# Patient Record
Sex: Female | Born: 1965 | Race: White | Hispanic: No | Smoking: Former smoker
Health system: Southern US, Community
[De-identification: ages and names within clinical notes are randomized; demographics above are authoritative.]

## PROBLEM LIST (undated history)

## (undated) DIAGNOSIS — K219 Gastro-esophageal reflux disease without esophagitis: Secondary | ICD-10-CM

## (undated) DIAGNOSIS — R05 Cough: Secondary | ICD-10-CM

## (undated) DIAGNOSIS — R42 Dizziness and giddiness: Secondary | ICD-10-CM

## (undated) DIAGNOSIS — M545 Low back pain, unspecified: Secondary | ICD-10-CM

## (undated) DIAGNOSIS — E049 Nontoxic goiter, unspecified: Secondary | ICD-10-CM

## (undated) DIAGNOSIS — T7840XA Allergy, unspecified, initial encounter: Secondary | ICD-10-CM

## (undated) DIAGNOSIS — N393 Stress incontinence (female) (male): Secondary | ICD-10-CM

## (undated) DIAGNOSIS — C439 Malignant melanoma of skin, unspecified: Secondary | ICD-10-CM

## (undated) DIAGNOSIS — Z85118 Personal history of other malignant neoplasm of bronchus and lung: Secondary | ICD-10-CM

## (undated) DIAGNOSIS — K644 Residual hemorrhoidal skin tags: Secondary | ICD-10-CM

## (undated) DIAGNOSIS — J449 Chronic obstructive pulmonary disease, unspecified: Secondary | ICD-10-CM

## (undated) DIAGNOSIS — G43909 Migraine, unspecified, not intractable, without status migrainosus: Secondary | ICD-10-CM

## (undated) DIAGNOSIS — R569 Unspecified convulsions: Secondary | ICD-10-CM

## (undated) DIAGNOSIS — I351 Nonrheumatic aortic (valve) insufficiency: Secondary | ICD-10-CM

## (undated) DIAGNOSIS — J45909 Unspecified asthma, uncomplicated: Secondary | ICD-10-CM

## (undated) DIAGNOSIS — J439 Emphysema, unspecified: Secondary | ICD-10-CM

## (undated) DIAGNOSIS — R0600 Dyspnea, unspecified: Secondary | ICD-10-CM

## (undated) DIAGNOSIS — Z1379 Encounter for other screening for genetic and chromosomal anomalies: Secondary | ICD-10-CM

## (undated) DIAGNOSIS — R059 Cough, unspecified: Secondary | ICD-10-CM

## (undated) DIAGNOSIS — R0683 Snoring: Secondary | ICD-10-CM

## (undated) DIAGNOSIS — E7800 Pure hypercholesterolemia, unspecified: Secondary | ICD-10-CM

## (undated) DIAGNOSIS — M7500 Adhesive capsulitis of unspecified shoulder: Secondary | ICD-10-CM

## (undated) DIAGNOSIS — E876 Hypokalemia: Secondary | ICD-10-CM

## (undated) DIAGNOSIS — F419 Anxiety disorder, unspecified: Secondary | ICD-10-CM

## (undated) DIAGNOSIS — M7989 Other specified soft tissue disorders: Secondary | ICD-10-CM

## (undated) DIAGNOSIS — G4700 Insomnia, unspecified: Secondary | ICD-10-CM

## (undated) DIAGNOSIS — R1013 Epigastric pain: Secondary | ICD-10-CM

## (undated) DIAGNOSIS — C3490 Malignant neoplasm of unspecified part of unspecified bronchus or lung: Secondary | ICD-10-CM

## (undated) DIAGNOSIS — C189 Malignant neoplasm of colon, unspecified: Secondary | ICD-10-CM

## (undated) DIAGNOSIS — J9601 Acute respiratory failure with hypoxia: Secondary | ICD-10-CM

## (undated) DIAGNOSIS — I509 Heart failure, unspecified: Secondary | ICD-10-CM

## (undated) DIAGNOSIS — F410 Panic disorder [episodic paroxysmal anxiety] without agoraphobia: Secondary | ICD-10-CM

## (undated) DIAGNOSIS — J84111 Idiopathic interstitial pneumonia, not otherwise specified: Secondary | ICD-10-CM

## (undated) HISTORY — DX: Nontoxic goiter, unspecified: E04.9

## (undated) HISTORY — DX: Heart failure, unspecified: I50.9

## (undated) HISTORY — DX: Anxiety disorder, unspecified: F41.9

## (undated) HISTORY — DX: Malignant neoplasm of unspecified part of unspecified bronchus or lung: C34.90

## (undated) HISTORY — DX: Emphysema, unspecified: J43.9

## (undated) HISTORY — DX: Malignant melanoma of skin, unspecified: C43.9

## (undated) HISTORY — PX: COLONOSCOPY: SHX174

## (undated) HISTORY — DX: Allergy, unspecified, initial encounter: T78.40XA

## (undated) HISTORY — DX: Acute respiratory failure with hypoxia: J96.01

## (undated) HISTORY — DX: Stress incontinence (female) (male): N39.3

## (undated) HISTORY — DX: Nonrheumatic aortic (valve) insufficiency: I35.1

## (undated) HISTORY — DX: Encounter for other screening for genetic and chromosomal anomalies: Z13.79

## (undated) HISTORY — DX: Low back pain, unspecified: M54.50

## (undated) HISTORY — DX: Idiopathic interstitial pneumonia, not otherwise specified: J84.111

## (undated) HISTORY — DX: Malignant neoplasm of colon, unspecified: C18.9

## (undated) HISTORY — DX: Unspecified asthma, uncomplicated: J45.909

## (undated) HISTORY — DX: Hypokalemia: E87.6

## (undated) HISTORY — DX: Migraine, unspecified, not intractable, without status migrainosus: G43.909

## (undated) HISTORY — DX: Unspecified convulsions: R56.9

## (undated) HISTORY — DX: Epigastric pain: R10.13

## (undated) HISTORY — DX: Pure hypercholesterolemia, unspecified: E78.00

## (undated) HISTORY — DX: Adhesive capsulitis of unspecified shoulder: M75.00

## (undated) HISTORY — DX: Residual hemorrhoidal skin tags: K64.4

## (undated) HISTORY — DX: Dizziness and giddiness: R42

## (undated) HISTORY — DX: Personal history of other malignant neoplasm of bronchus and lung: Z85.118

## (undated) HISTORY — DX: Gastro-esophageal reflux disease without esophagitis: K21.9

## (undated) HISTORY — DX: Dyspnea, unspecified: R06.00

## (undated) HISTORY — PX: POLYPECTOMY: SHX149

## (undated) HISTORY — DX: Snoring: R06.83

## (undated) HISTORY — DX: Panic disorder (episodic paroxysmal anxiety): F41.0

## (undated) HISTORY — DX: Insomnia, unspecified: G47.00

## (undated) HISTORY — DX: Low back pain: M54.5

## (undated) HISTORY — PX: LUNG REMOVAL, PARTIAL: SHX233

## (undated) HISTORY — DX: Other specified soft tissue disorders: M79.89

---

## 1985-09-02 HISTORY — PX: BREAST EXCISIONAL BIOPSY: SUR124

## 1999-09-03 HISTORY — PX: ABDOMINAL HYSTERECTOMY: SHX81

## 2000-04-04 ENCOUNTER — Other Ambulatory Visit: Admission: RE | Admit: 2000-04-04 | Discharge: 2000-04-04 | Payer: Self-pay | Admitting: Orthopedic Surgery

## 2000-09-29 ENCOUNTER — Other Ambulatory Visit: Admission: RE | Admit: 2000-09-29 | Discharge: 2000-09-29 | Payer: Self-pay | Admitting: Obstetrics and Gynecology

## 2000-10-06 ENCOUNTER — Ambulatory Visit (HOSPITAL_COMMUNITY): Admission: RE | Admit: 2000-10-06 | Discharge: 2000-10-06 | Payer: Self-pay | Admitting: Obstetrics and Gynecology

## 2000-10-06 ENCOUNTER — Encounter: Payer: Self-pay | Admitting: Obstetrics and Gynecology

## 2000-12-01 HISTORY — PX: OTHER SURGICAL HISTORY: SHX169

## 2000-12-12 ENCOUNTER — Inpatient Hospital Stay (HOSPITAL_COMMUNITY): Admission: RE | Admit: 2000-12-12 | Discharge: 2000-12-13 | Payer: Self-pay | Admitting: Obstetrics and Gynecology

## 2000-12-12 ENCOUNTER — Encounter: Payer: Self-pay | Admitting: Obstetrics and Gynecology

## 2000-12-12 ENCOUNTER — Encounter (INDEPENDENT_AMBULATORY_CARE_PROVIDER_SITE_OTHER): Payer: Self-pay | Admitting: Specialist

## 2001-01-12 ENCOUNTER — Ambulatory Visit: Admission: RE | Admit: 2001-01-12 | Discharge: 2001-01-12 | Payer: Self-pay | Admitting: Pulmonary Disease

## 2001-11-18 ENCOUNTER — Encounter: Admission: RE | Admit: 2001-11-18 | Discharge: 2001-11-18 | Payer: Self-pay | Admitting: Family Medicine

## 2001-11-19 ENCOUNTER — Encounter: Admission: RE | Admit: 2001-11-19 | Discharge: 2001-11-19 | Payer: Self-pay | Admitting: Sports Medicine

## 2001-12-24 ENCOUNTER — Encounter: Admission: RE | Admit: 2001-12-24 | Discharge: 2001-12-24 | Payer: Self-pay | Admitting: Family Medicine

## 2002-01-26 ENCOUNTER — Encounter: Admission: RE | Admit: 2002-01-26 | Discharge: 2002-01-26 | Payer: Self-pay | Admitting: Sports Medicine

## 2002-02-08 ENCOUNTER — Encounter: Admission: RE | Admit: 2002-02-08 | Discharge: 2002-02-08 | Payer: Self-pay | Admitting: Family Medicine

## 2002-02-09 ENCOUNTER — Encounter: Payer: Self-pay | Admitting: Sports Medicine

## 2002-02-09 ENCOUNTER — Ambulatory Visit (HOSPITAL_COMMUNITY): Admission: RE | Admit: 2002-02-09 | Discharge: 2002-02-09 | Payer: Self-pay | Admitting: Sports Medicine

## 2002-02-11 ENCOUNTER — Encounter: Admission: RE | Admit: 2002-02-11 | Discharge: 2002-02-11 | Payer: Self-pay | Admitting: Family Medicine

## 2002-02-25 ENCOUNTER — Encounter: Admission: RE | Admit: 2002-02-25 | Discharge: 2002-02-25 | Payer: Self-pay | Admitting: Sports Medicine

## 2002-03-29 ENCOUNTER — Encounter: Admission: RE | Admit: 2002-03-29 | Discharge: 2002-03-29 | Payer: Self-pay | Admitting: Family Medicine

## 2002-04-13 ENCOUNTER — Inpatient Hospital Stay (HOSPITAL_COMMUNITY): Admission: AD | Admit: 2002-04-13 | Discharge: 2002-04-13 | Payer: Self-pay | Admitting: *Deleted

## 2002-05-12 ENCOUNTER — Inpatient Hospital Stay (HOSPITAL_COMMUNITY): Admission: AD | Admit: 2002-05-12 | Discharge: 2002-05-12 | Payer: Self-pay | Admitting: Obstetrics and Gynecology

## 2002-06-16 ENCOUNTER — Ambulatory Visit (HOSPITAL_COMMUNITY): Admission: RE | Admit: 2002-06-16 | Discharge: 2002-06-16 | Payer: Self-pay | Admitting: Obstetrics and Gynecology

## 2002-06-16 ENCOUNTER — Encounter: Payer: Self-pay | Admitting: Obstetrics and Gynecology

## 2002-07-03 HISTORY — PX: SPIROMETRY: SHX456

## 2002-07-08 ENCOUNTER — Encounter: Payer: Self-pay | Admitting: Thoracic Surgery

## 2002-07-09 ENCOUNTER — Encounter (INDEPENDENT_AMBULATORY_CARE_PROVIDER_SITE_OTHER): Payer: Self-pay | Admitting: Specialist

## 2002-07-09 ENCOUNTER — Ambulatory Visit (HOSPITAL_COMMUNITY): Admission: RE | Admit: 2002-07-09 | Discharge: 2002-07-09 | Payer: Self-pay | Admitting: Thoracic Surgery

## 2002-07-15 ENCOUNTER — Encounter (INDEPENDENT_AMBULATORY_CARE_PROVIDER_SITE_OTHER): Payer: Self-pay | Admitting: Specialist

## 2002-07-15 ENCOUNTER — Encounter: Payer: Self-pay | Admitting: Thoracic Surgery

## 2002-07-15 ENCOUNTER — Inpatient Hospital Stay (HOSPITAL_COMMUNITY): Admission: RE | Admit: 2002-07-15 | Discharge: 2002-07-22 | Payer: Self-pay | Admitting: Thoracic Surgery

## 2002-07-16 ENCOUNTER — Encounter: Payer: Self-pay | Admitting: Thoracic Surgery

## 2002-07-17 ENCOUNTER — Encounter: Payer: Self-pay | Admitting: Thoracic Surgery

## 2002-07-18 ENCOUNTER — Encounter: Payer: Self-pay | Admitting: Thoracic Surgery

## 2002-07-19 ENCOUNTER — Encounter: Payer: Self-pay | Admitting: Thoracic Surgery

## 2002-07-20 ENCOUNTER — Encounter: Payer: Self-pay | Admitting: Thoracic Surgery

## 2002-07-22 ENCOUNTER — Encounter: Payer: Self-pay | Admitting: Thoracic Surgery

## 2002-07-28 ENCOUNTER — Encounter: Payer: Self-pay | Admitting: Thoracic Surgery

## 2002-07-28 ENCOUNTER — Encounter: Admission: RE | Admit: 2002-07-28 | Discharge: 2002-07-28 | Payer: Self-pay | Admitting: Thoracic Surgery

## 2002-08-02 ENCOUNTER — Encounter: Payer: Self-pay | Admitting: Thoracic Surgery

## 2002-08-02 ENCOUNTER — Ambulatory Visit (HOSPITAL_COMMUNITY): Admission: RE | Admit: 2002-08-02 | Discharge: 2002-08-02 | Payer: Self-pay | Admitting: Thoracic Surgery

## 2002-08-04 ENCOUNTER — Ambulatory Visit: Admission: RE | Admit: 2002-08-04 | Discharge: 2002-10-14 | Payer: Self-pay | Admitting: Radiation Oncology

## 2002-08-20 ENCOUNTER — Encounter: Payer: Self-pay | Admitting: Oncology

## 2002-08-20 ENCOUNTER — Encounter: Admission: RE | Admit: 2002-08-20 | Discharge: 2002-08-20 | Payer: Self-pay | Admitting: Oncology

## 2002-08-24 ENCOUNTER — Encounter: Payer: Self-pay | Admitting: Thoracic Surgery

## 2002-08-24 ENCOUNTER — Encounter: Admission: RE | Admit: 2002-08-24 | Discharge: 2002-08-24 | Payer: Self-pay | Admitting: Thoracic Surgery

## 2002-09-21 ENCOUNTER — Encounter: Admission: RE | Admit: 2002-09-21 | Discharge: 2002-09-21 | Payer: Self-pay | Admitting: Thoracic Surgery

## 2002-09-21 ENCOUNTER — Encounter: Payer: Self-pay | Admitting: Thoracic Surgery

## 2002-10-06 ENCOUNTER — Encounter: Payer: Self-pay | Admitting: Oncology

## 2002-10-06 ENCOUNTER — Ambulatory Visit (HOSPITAL_COMMUNITY): Admission: RE | Admit: 2002-10-06 | Discharge: 2002-10-06 | Payer: Self-pay | Admitting: Oncology

## 2002-12-16 ENCOUNTER — Ambulatory Visit (HOSPITAL_COMMUNITY): Admission: RE | Admit: 2002-12-16 | Discharge: 2002-12-16 | Payer: Self-pay | Admitting: Oncology

## 2002-12-16 ENCOUNTER — Encounter: Payer: Self-pay | Admitting: Oncology

## 2002-12-21 ENCOUNTER — Encounter: Admission: RE | Admit: 2002-12-21 | Discharge: 2002-12-21 | Payer: Self-pay | Admitting: Thoracic Surgery

## 2002-12-21 ENCOUNTER — Encounter: Payer: Self-pay | Admitting: Thoracic Surgery

## 2003-01-17 ENCOUNTER — Encounter: Payer: Self-pay | Admitting: Oncology

## 2003-01-17 ENCOUNTER — Ambulatory Visit: Admission: RE | Admit: 2003-01-17 | Discharge: 2003-01-17 | Payer: Self-pay | Admitting: Oncology

## 2003-03-22 ENCOUNTER — Encounter: Admission: RE | Admit: 2003-03-22 | Discharge: 2003-03-22 | Payer: Self-pay | Admitting: Thoracic Surgery

## 2003-03-22 ENCOUNTER — Encounter: Payer: Self-pay | Admitting: Thoracic Surgery

## 2003-03-23 ENCOUNTER — Ambulatory Visit (HOSPITAL_COMMUNITY): Admission: RE | Admit: 2003-03-23 | Discharge: 2003-03-23 | Payer: Self-pay | Admitting: Oncology

## 2003-03-23 ENCOUNTER — Encounter: Payer: Self-pay | Admitting: Oncology

## 2003-04-19 ENCOUNTER — Other Ambulatory Visit: Admission: RE | Admit: 2003-04-19 | Discharge: 2003-04-19 | Payer: Self-pay | Admitting: Obstetrics and Gynecology

## 2003-04-26 ENCOUNTER — Encounter: Admission: RE | Admit: 2003-04-26 | Discharge: 2003-04-26 | Payer: Self-pay | Admitting: Obstetrics and Gynecology

## 2003-04-26 ENCOUNTER — Encounter: Payer: Self-pay | Admitting: Obstetrics and Gynecology

## 2003-06-22 ENCOUNTER — Encounter: Admission: RE | Admit: 2003-06-22 | Discharge: 2003-06-22 | Payer: Self-pay | Admitting: Thoracic Surgery

## 2003-06-22 ENCOUNTER — Encounter: Payer: Self-pay | Admitting: Thoracic Surgery

## 2003-09-21 ENCOUNTER — Ambulatory Visit (HOSPITAL_COMMUNITY): Admission: RE | Admit: 2003-09-21 | Discharge: 2003-09-21 | Payer: Self-pay | Admitting: Oncology

## 2003-11-08 ENCOUNTER — Ambulatory Visit (HOSPITAL_BASED_OUTPATIENT_CLINIC_OR_DEPARTMENT_OTHER): Admission: RE | Admit: 2003-11-08 | Discharge: 2003-11-08 | Payer: Self-pay | Admitting: Internal Medicine

## 2003-12-22 ENCOUNTER — Ambulatory Visit (HOSPITAL_COMMUNITY): Admission: RE | Admit: 2003-12-22 | Discharge: 2003-12-22 | Payer: Self-pay | Admitting: Oncology

## 2004-02-07 ENCOUNTER — Emergency Department (HOSPITAL_COMMUNITY): Admission: EM | Admit: 2004-02-07 | Discharge: 2004-02-07 | Payer: Self-pay | Admitting: Emergency Medicine

## 2004-03-16 ENCOUNTER — Ambulatory Visit (HOSPITAL_COMMUNITY): Admission: RE | Admit: 2004-03-16 | Discharge: 2004-03-16 | Payer: Self-pay | Admitting: Oncology

## 2004-06-08 ENCOUNTER — Encounter: Admission: RE | Admit: 2004-06-08 | Discharge: 2004-06-08 | Payer: Self-pay | Admitting: Oncology

## 2004-07-18 ENCOUNTER — Encounter: Admission: RE | Admit: 2004-07-18 | Discharge: 2004-07-18 | Payer: Self-pay | Admitting: Thoracic Surgery

## 2004-08-23 ENCOUNTER — Ambulatory Visit: Payer: Self-pay | Admitting: Family Medicine

## 2004-09-19 ENCOUNTER — Ambulatory Visit (HOSPITAL_COMMUNITY): Admission: RE | Admit: 2004-09-19 | Discharge: 2004-09-19 | Payer: Self-pay | Admitting: Oncology

## 2004-09-21 ENCOUNTER — Ambulatory Visit: Payer: Self-pay | Admitting: Family Medicine

## 2004-09-26 ENCOUNTER — Ambulatory Visit: Payer: Self-pay | Admitting: Oncology

## 2004-10-16 ENCOUNTER — Ambulatory Visit: Payer: Self-pay | Admitting: Internal Medicine

## 2004-10-31 ENCOUNTER — Ambulatory Visit: Payer: Self-pay | Admitting: Internal Medicine

## 2004-10-31 ENCOUNTER — Encounter (INDEPENDENT_AMBULATORY_CARE_PROVIDER_SITE_OTHER): Payer: Self-pay | Admitting: Specialist

## 2004-10-31 ENCOUNTER — Ambulatory Visit (HOSPITAL_COMMUNITY): Admission: RE | Admit: 2004-10-31 | Discharge: 2004-10-31 | Payer: Self-pay | Admitting: Internal Medicine

## 2004-11-14 ENCOUNTER — Ambulatory Visit: Payer: Self-pay | Admitting: Internal Medicine

## 2005-01-08 ENCOUNTER — Ambulatory Visit: Payer: Self-pay | Admitting: Family Medicine

## 2005-01-16 ENCOUNTER — Encounter: Admission: RE | Admit: 2005-01-16 | Discharge: 2005-01-16 | Payer: Self-pay | Admitting: Thoracic Surgery

## 2005-01-31 HISTORY — PX: BRONCHOSCOPY: SUR163

## 2005-02-14 ENCOUNTER — Ambulatory Visit: Payer: Self-pay | Admitting: Family Medicine

## 2005-02-14 ENCOUNTER — Ambulatory Visit: Payer: Self-pay | Admitting: Internal Medicine

## 2005-02-26 ENCOUNTER — Ambulatory Visit: Payer: Self-pay | Admitting: Family Medicine

## 2005-02-26 ENCOUNTER — Ambulatory Visit (HOSPITAL_COMMUNITY): Admission: RE | Admit: 2005-02-26 | Discharge: 2005-02-26 | Payer: Self-pay | Admitting: Internal Medicine

## 2005-03-21 ENCOUNTER — Ambulatory Visit: Payer: Self-pay | Admitting: Family Medicine

## 2005-03-25 ENCOUNTER — Ambulatory Visit: Payer: Self-pay | Admitting: Oncology

## 2005-04-02 ENCOUNTER — Encounter (INDEPENDENT_AMBULATORY_CARE_PROVIDER_SITE_OTHER): Payer: Self-pay | Admitting: *Deleted

## 2005-04-09 ENCOUNTER — Ambulatory Visit (HOSPITAL_COMMUNITY): Admission: RE | Admit: 2005-04-09 | Discharge: 2005-04-09 | Payer: Self-pay | Admitting: Oncology

## 2005-04-29 ENCOUNTER — Ambulatory Visit: Payer: Self-pay | Admitting: Family Medicine

## 2005-05-16 ENCOUNTER — Ambulatory Visit: Payer: Self-pay | Admitting: Internal Medicine

## 2005-05-28 ENCOUNTER — Ambulatory Visit: Payer: Self-pay | Admitting: Family Medicine

## 2005-06-25 ENCOUNTER — Encounter: Admission: RE | Admit: 2005-06-25 | Discharge: 2005-06-25 | Payer: Self-pay | Admitting: Oncology

## 2005-07-17 ENCOUNTER — Encounter: Admission: RE | Admit: 2005-07-17 | Discharge: 2005-07-17 | Payer: Self-pay | Admitting: Thoracic Surgery

## 2005-08-19 ENCOUNTER — Ambulatory Visit: Payer: Self-pay | Admitting: Family Medicine

## 2005-08-19 ENCOUNTER — Ambulatory Visit (HOSPITAL_COMMUNITY): Admission: RE | Admit: 2005-08-19 | Discharge: 2005-08-19 | Payer: Self-pay | Admitting: Family Medicine

## 2005-09-13 ENCOUNTER — Ambulatory Visit: Payer: Self-pay | Admitting: Internal Medicine

## 2005-09-26 ENCOUNTER — Ambulatory Visit: Payer: Self-pay | Admitting: Oncology

## 2005-09-27 ENCOUNTER — Ambulatory Visit (HOSPITAL_COMMUNITY): Admission: RE | Admit: 2005-09-27 | Discharge: 2005-09-27 | Payer: Self-pay | Admitting: Oncology

## 2005-10-17 ENCOUNTER — Ambulatory Visit: Payer: Self-pay | Admitting: Family Medicine

## 2005-11-14 ENCOUNTER — Ambulatory Visit: Payer: Self-pay | Admitting: Family Medicine

## 2005-11-21 ENCOUNTER — Ambulatory Visit: Payer: Self-pay | Admitting: Family Medicine

## 2005-12-16 ENCOUNTER — Ambulatory Visit: Payer: Self-pay | Admitting: Sports Medicine

## 2006-01-13 ENCOUNTER — Ambulatory Visit (HOSPITAL_COMMUNITY): Admission: RE | Admit: 2006-01-13 | Discharge: 2006-01-13 | Payer: Self-pay | Admitting: Thoracic Surgery

## 2006-01-17 ENCOUNTER — Ambulatory Visit: Payer: Self-pay | Admitting: Internal Medicine

## 2006-01-29 ENCOUNTER — Ambulatory Visit: Payer: Self-pay | Admitting: Family Medicine

## 2006-01-31 ENCOUNTER — Ambulatory Visit: Payer: Self-pay | Admitting: Family Medicine

## 2006-03-18 ENCOUNTER — Ambulatory Visit: Payer: Self-pay | Admitting: Oncology

## 2006-03-21 LAB — COMPREHENSIVE METABOLIC PANEL
ALT: 33 U/L (ref 0–40)
Albumin: 3.9 g/dL (ref 3.5–5.2)
CO2: 24 mEq/L (ref 19–32)
Calcium: 8.7 mg/dL (ref 8.4–10.5)
Chloride: 105 mEq/L (ref 96–112)
Glucose, Bld: 88 mg/dL (ref 70–99)
Potassium: 3.4 mEq/L — ABNORMAL LOW (ref 3.5–5.3)
Sodium: 138 mEq/L (ref 135–145)
Total Protein: 7.2 g/dL (ref 6.0–8.3)

## 2006-03-21 LAB — CBC WITH DIFFERENTIAL/PLATELET
Eosinophils Absolute: 0.1 10*3/uL (ref 0.0–0.5)
LYMPH%: 14.2 % (ref 14.0–48.0)
MONO#: 0.5 10*3/uL (ref 0.1–0.9)
NEUT#: 6.1 10*3/uL (ref 1.5–6.5)
Platelets: 239 10*3/uL (ref 145–400)
RBC: 4.15 10*6/uL (ref 3.70–5.32)
RDW: 13.9 % (ref 11.3–14.5)
WBC: 7.8 10*3/uL (ref 3.9–10.0)
lymph#: 1.1 10*3/uL (ref 0.9–3.3)

## 2006-03-24 ENCOUNTER — Ambulatory Visit (HOSPITAL_COMMUNITY): Admission: RE | Admit: 2006-03-24 | Discharge: 2006-03-24 | Payer: Self-pay | Admitting: Oncology

## 2006-03-24 ENCOUNTER — Ambulatory Visit: Payer: Self-pay | Admitting: Family Medicine

## 2006-05-19 ENCOUNTER — Ambulatory Visit: Payer: Self-pay | Admitting: Internal Medicine

## 2006-05-21 ENCOUNTER — Ambulatory Visit: Payer: Self-pay | Admitting: Family Medicine

## 2006-06-03 ENCOUNTER — Ambulatory Visit: Payer: Self-pay | Admitting: Sports Medicine

## 2006-06-26 ENCOUNTER — Encounter: Admission: RE | Admit: 2006-06-26 | Discharge: 2006-06-26 | Payer: Self-pay | Admitting: Oncology

## 2006-06-27 ENCOUNTER — Ambulatory Visit: Payer: Self-pay | Admitting: Family Medicine

## 2006-07-07 ENCOUNTER — Ambulatory Visit (HOSPITAL_COMMUNITY): Admission: RE | Admit: 2006-07-07 | Discharge: 2006-07-07 | Payer: Self-pay | Admitting: Thoracic Surgery

## 2006-07-15 ENCOUNTER — Ambulatory Visit (HOSPITAL_COMMUNITY): Admission: RE | Admit: 2006-07-15 | Discharge: 2006-07-15 | Payer: Self-pay | Admitting: Thoracic Surgery

## 2006-07-23 ENCOUNTER — Ambulatory Visit: Payer: Self-pay | Admitting: Family Medicine

## 2006-08-18 ENCOUNTER — Ambulatory Visit: Payer: Self-pay | Admitting: Family Medicine

## 2006-08-20 ENCOUNTER — Ambulatory Visit (HOSPITAL_COMMUNITY): Admission: RE | Admit: 2006-08-20 | Discharge: 2006-08-20 | Payer: Self-pay | Admitting: Sports Medicine

## 2006-09-11 ENCOUNTER — Ambulatory Visit: Payer: Self-pay | Admitting: Family Medicine

## 2006-09-16 ENCOUNTER — Ambulatory Visit: Payer: Self-pay | Admitting: Internal Medicine

## 2006-09-16 ENCOUNTER — Ambulatory Visit: Payer: Self-pay | Admitting: Oncology

## 2006-09-22 LAB — COMPREHENSIVE METABOLIC PANEL
ALT: 18 U/L (ref 0–35)
Albumin: 4.4 g/dL (ref 3.5–5.2)
Alkaline Phosphatase: 77 U/L (ref 39–117)
CO2: 22 mEq/L (ref 19–32)
Glucose, Bld: 101 mg/dL — ABNORMAL HIGH (ref 70–99)
Potassium: 3.7 mEq/L (ref 3.5–5.3)
Sodium: 140 mEq/L (ref 135–145)
Total Protein: 7.9 g/dL (ref 6.0–8.3)

## 2006-09-22 LAB — CBC WITH DIFFERENTIAL/PLATELET
BASO%: 1 % (ref 0.0–2.0)
Eosinophils Absolute: 0.1 10*3/uL (ref 0.0–0.5)
MCHC: 36.2 g/dL — ABNORMAL HIGH (ref 32.0–36.0)
MONO#: 0.6 10*3/uL (ref 0.1–0.9)
NEUT#: 6.9 10*3/uL — ABNORMAL HIGH (ref 1.5–6.5)
RBC: 4.6 10*6/uL (ref 3.70–5.32)
RDW: 11 % — ABNORMAL LOW (ref 11.3–14.5)
WBC: 9.2 10*3/uL (ref 3.9–10.0)

## 2006-09-24 ENCOUNTER — Ambulatory Visit (HOSPITAL_COMMUNITY): Admission: RE | Admit: 2006-09-24 | Discharge: 2006-09-24 | Payer: Self-pay | Admitting: Oncology

## 2006-10-01 ENCOUNTER — Encounter (INDEPENDENT_AMBULATORY_CARE_PROVIDER_SITE_OTHER): Payer: Self-pay | Admitting: Specialist

## 2006-10-01 ENCOUNTER — Other Ambulatory Visit: Admission: RE | Admit: 2006-10-01 | Discharge: 2006-10-01 | Payer: Self-pay | Admitting: Interventional Radiology

## 2006-10-01 ENCOUNTER — Encounter: Admission: RE | Admit: 2006-10-01 | Discharge: 2006-10-01 | Payer: Self-pay | Admitting: Surgery

## 2006-10-13 ENCOUNTER — Ambulatory Visit: Payer: Self-pay | Admitting: Psychology

## 2006-10-20 ENCOUNTER — Ambulatory Visit: Payer: Self-pay | Admitting: Family Medicine

## 2006-10-24 ENCOUNTER — Ambulatory Visit: Payer: Self-pay | Admitting: Family Medicine

## 2006-10-30 DIAGNOSIS — F410 Panic disorder [episodic paroxysmal anxiety] without agoraphobia: Secondary | ICD-10-CM

## 2006-10-30 DIAGNOSIS — G4700 Insomnia, unspecified: Secondary | ICD-10-CM

## 2006-10-30 DIAGNOSIS — C3491 Malignant neoplasm of unspecified part of right bronchus or lung: Secondary | ICD-10-CM | POA: Insufficient documentation

## 2006-10-30 DIAGNOSIS — E7800 Pure hypercholesterolemia, unspecified: Secondary | ICD-10-CM

## 2006-10-30 DIAGNOSIS — G43909 Migraine, unspecified, not intractable, without status migrainosus: Secondary | ICD-10-CM | POA: Insufficient documentation

## 2006-10-31 ENCOUNTER — Encounter (INDEPENDENT_AMBULATORY_CARE_PROVIDER_SITE_OTHER): Payer: Self-pay | Admitting: *Deleted

## 2006-11-10 ENCOUNTER — Ambulatory Visit: Payer: Self-pay | Admitting: Psychology

## 2006-11-10 DIAGNOSIS — F411 Generalized anxiety disorder: Secondary | ICD-10-CM

## 2006-11-19 ENCOUNTER — Ambulatory Visit: Payer: Self-pay | Admitting: Sports Medicine

## 2006-11-19 ENCOUNTER — Encounter: Admission: RE | Admit: 2006-11-19 | Discharge: 2006-11-19 | Payer: Self-pay | Admitting: Thoracic Surgery

## 2006-11-19 ENCOUNTER — Ambulatory Visit: Payer: Self-pay | Admitting: Thoracic Surgery

## 2006-12-01 ENCOUNTER — Ambulatory Visit: Payer: Self-pay | Admitting: Family Medicine

## 2006-12-04 ENCOUNTER — Ambulatory Visit: Payer: Self-pay | Admitting: Family Medicine

## 2006-12-22 ENCOUNTER — Ambulatory Visit: Payer: Self-pay | Admitting: Psychology

## 2007-01-15 ENCOUNTER — Ambulatory Visit: Payer: Self-pay | Admitting: Internal Medicine

## 2007-02-11 ENCOUNTER — Ambulatory Visit (HOSPITAL_BASED_OUTPATIENT_CLINIC_OR_DEPARTMENT_OTHER): Admission: RE | Admit: 2007-02-11 | Discharge: 2007-02-11 | Payer: Self-pay | Admitting: Internal Medicine

## 2007-02-14 ENCOUNTER — Ambulatory Visit: Payer: Self-pay | Admitting: Internal Medicine

## 2007-02-25 ENCOUNTER — Ambulatory Visit: Payer: Self-pay | Admitting: Internal Medicine

## 2007-05-20 ENCOUNTER — Encounter: Admission: RE | Admit: 2007-05-20 | Discharge: 2007-05-20 | Payer: Self-pay | Admitting: Thoracic Surgery

## 2007-05-20 ENCOUNTER — Ambulatory Visit: Payer: Self-pay | Admitting: Thoracic Surgery

## 2007-06-02 ENCOUNTER — Encounter: Admission: RE | Admit: 2007-06-02 | Discharge: 2007-06-02 | Payer: Self-pay | Admitting: Surgery

## 2007-06-04 LAB — CONVERTED CEMR LAB: Pap Smear: NORMAL

## 2007-06-25 ENCOUNTER — Ambulatory Visit: Payer: Self-pay | Admitting: Internal Medicine

## 2007-06-30 ENCOUNTER — Encounter: Admission: RE | Admit: 2007-06-30 | Discharge: 2007-06-30 | Payer: Self-pay | Admitting: Oncology

## 2007-07-02 ENCOUNTER — Encounter: Payer: Self-pay | Admitting: Family Medicine

## 2007-07-17 ENCOUNTER — Ambulatory Visit: Payer: Self-pay | Admitting: Oncology

## 2007-07-21 LAB — COMPREHENSIVE METABOLIC PANEL
ALT: 16 U/L (ref 0–35)
CO2: 23 mEq/L (ref 19–32)
Calcium: 8.8 mg/dL (ref 8.4–10.5)
Chloride: 103 mEq/L (ref 96–112)
Creatinine, Ser: 0.92 mg/dL (ref 0.40–1.20)
Glucose, Bld: 101 mg/dL — ABNORMAL HIGH (ref 70–99)
Sodium: 138 mEq/L (ref 135–145)
Total Protein: 7.5 g/dL (ref 6.0–8.3)

## 2007-07-21 LAB — CBC WITH DIFFERENTIAL/PLATELET
BASO%: 0.9 % (ref 0.0–2.0)
Eosinophils Absolute: 0.1 10*3/uL (ref 0.0–0.5)
HCT: 38.3 % (ref 34.8–46.6)
HGB: 14 g/dL (ref 11.6–15.9)
MCHC: 36.5 g/dL — ABNORMAL HIGH (ref 32.0–36.0)
MONO#: 0.4 10*3/uL (ref 0.1–0.9)
NEUT#: 5.1 10*3/uL (ref 1.5–6.5)
NEUT%: 77.1 % — ABNORMAL HIGH (ref 39.6–76.8)
WBC: 6.7 10*3/uL (ref 3.9–10.0)
lymph#: 0.9 10*3/uL (ref 0.9–3.3)

## 2007-07-21 LAB — CANCER ANTIGEN 27.29: CA 27.29: 26 U/mL (ref 0–39)

## 2007-07-23 ENCOUNTER — Ambulatory Visit: Payer: Self-pay | Admitting: Family Medicine

## 2007-07-23 ENCOUNTER — Ambulatory Visit (HOSPITAL_COMMUNITY): Admission: RE | Admit: 2007-07-23 | Discharge: 2007-07-23 | Payer: Self-pay | Admitting: Oncology

## 2007-07-28 ENCOUNTER — Encounter: Payer: Self-pay | Admitting: Internal Medicine

## 2007-07-28 ENCOUNTER — Encounter: Payer: Self-pay | Admitting: Family Medicine

## 2007-09-01 ENCOUNTER — Encounter: Payer: Self-pay | Admitting: *Deleted

## 2007-09-02 ENCOUNTER — Ambulatory Visit: Payer: Self-pay | Admitting: Family Medicine

## 2007-09-30 ENCOUNTER — Telehealth: Payer: Self-pay | Admitting: Internal Medicine

## 2007-10-03 ENCOUNTER — Encounter: Payer: Self-pay | Admitting: Internal Medicine

## 2007-10-04 HISTORY — PX: OTHER SURGICAL HISTORY: SHX169

## 2007-10-07 ENCOUNTER — Telehealth (INDEPENDENT_AMBULATORY_CARE_PROVIDER_SITE_OTHER): Payer: Self-pay | Admitting: *Deleted

## 2007-10-12 ENCOUNTER — Telehealth (INDEPENDENT_AMBULATORY_CARE_PROVIDER_SITE_OTHER): Payer: Self-pay | Admitting: *Deleted

## 2007-10-21 ENCOUNTER — Ambulatory Visit (HOSPITAL_COMMUNITY): Admission: RE | Admit: 2007-10-21 | Discharge: 2007-10-21 | Payer: Self-pay | Admitting: Thoracic Surgery

## 2007-10-21 ENCOUNTER — Ambulatory Visit: Payer: Self-pay | Admitting: Thoracic Surgery

## 2007-10-26 ENCOUNTER — Ambulatory Visit: Payer: Self-pay | Admitting: Internal Medicine

## 2007-10-27 ENCOUNTER — Ambulatory Visit: Payer: Self-pay | Admitting: Thoracic Surgery

## 2007-11-18 ENCOUNTER — Encounter: Payer: Self-pay | Admitting: Family Medicine

## 2007-11-18 ENCOUNTER — Ambulatory Visit: Payer: Self-pay | Admitting: Family Medicine

## 2007-11-23 ENCOUNTER — Encounter: Payer: Self-pay | Admitting: Family Medicine

## 2007-11-25 ENCOUNTER — Encounter: Payer: Self-pay | Admitting: Family Medicine

## 2007-11-25 ENCOUNTER — Telehealth: Payer: Self-pay | Admitting: *Deleted

## 2007-12-30 ENCOUNTER — Ambulatory Visit (HOSPITAL_COMMUNITY): Admission: RE | Admit: 2007-12-30 | Discharge: 2007-12-30 | Payer: Self-pay | Admitting: Family Medicine

## 2007-12-30 ENCOUNTER — Ambulatory Visit: Payer: Self-pay | Admitting: Family Medicine

## 2007-12-30 ENCOUNTER — Encounter: Payer: Self-pay | Admitting: Internal Medicine

## 2007-12-31 ENCOUNTER — Ambulatory Visit: Payer: Self-pay | Admitting: Internal Medicine

## 2008-01-07 ENCOUNTER — Encounter: Payer: Self-pay | Admitting: *Deleted

## 2008-01-15 ENCOUNTER — Ambulatory Visit: Payer: Self-pay | Admitting: Family Medicine

## 2008-01-15 ENCOUNTER — Encounter: Payer: Self-pay | Admitting: Family Medicine

## 2008-01-15 LAB — CONVERTED CEMR LAB
BUN: 15 mg/dL (ref 6–23)
CO2: 16 meq/L — ABNORMAL LOW (ref 19–32)
Calcium: 8.4 mg/dL (ref 8.4–10.5)
Creatinine, Ser: 0.88 mg/dL (ref 0.40–1.20)
Glucose, Bld: 98 mg/dL (ref 70–99)
Sodium: 141 meq/L (ref 135–145)
TSH: 0.998 microintl units/mL (ref 0.350–5.50)
Total CHOL/HDL Ratio: 4.8

## 2008-01-17 ENCOUNTER — Encounter: Payer: Self-pay | Admitting: Family Medicine

## 2008-02-03 ENCOUNTER — Ambulatory Visit: Payer: Self-pay | Admitting: Internal Medicine

## 2008-02-10 ENCOUNTER — Ambulatory Visit (HOSPITAL_COMMUNITY): Admission: RE | Admit: 2008-02-10 | Discharge: 2008-02-10 | Payer: Self-pay | Admitting: Thoracic Surgery

## 2008-02-10 ENCOUNTER — Ambulatory Visit: Payer: Self-pay | Admitting: Thoracic Surgery

## 2008-02-18 ENCOUNTER — Encounter: Payer: Self-pay | Admitting: Internal Medicine

## 2008-02-19 ENCOUNTER — Encounter: Payer: Self-pay | Admitting: Internal Medicine

## 2008-03-24 ENCOUNTER — Encounter: Payer: Self-pay | Admitting: Family Medicine

## 2008-04-18 ENCOUNTER — Encounter: Payer: Self-pay | Admitting: Internal Medicine

## 2008-04-28 ENCOUNTER — Telehealth: Payer: Self-pay | Admitting: *Deleted

## 2008-05-19 ENCOUNTER — Ambulatory Visit: Payer: Self-pay | Admitting: Family Medicine

## 2008-06-03 ENCOUNTER — Ambulatory Visit: Payer: Self-pay | Admitting: Family Medicine

## 2008-06-03 LAB — CONVERTED CEMR LAB

## 2008-06-30 ENCOUNTER — Encounter: Admission: RE | Admit: 2008-06-30 | Discharge: 2008-06-30 | Payer: Self-pay | Admitting: Family Medicine

## 2008-07-05 ENCOUNTER — Ambulatory Visit: Payer: Self-pay | Admitting: Family Medicine

## 2008-07-22 ENCOUNTER — Ambulatory Visit: Payer: Self-pay | Admitting: Oncology

## 2008-07-25 ENCOUNTER — Ambulatory Visit (HOSPITAL_COMMUNITY): Admission: RE | Admit: 2008-07-25 | Discharge: 2008-07-25 | Payer: Self-pay | Admitting: Thoracic Surgery

## 2008-07-25 LAB — CBC WITH DIFFERENTIAL/PLATELET
Basophils Absolute: 0 10*3/uL (ref 0.0–0.1)
Eosinophils Absolute: 0 10*3/uL (ref 0.0–0.5)
HGB: 13.8 g/dL (ref 11.6–15.9)
LYMPH%: 18.6 % (ref 14.0–48.0)
MCV: 88 fL (ref 81.0–101.0)
MONO%: 5.7 % (ref 0.0–13.0)
NEUT#: 5.2 10*3/uL (ref 1.5–6.5)
Platelets: 203 10*3/uL (ref 145–400)
RDW: 13.3 % (ref 11.3–14.5)

## 2008-07-26 LAB — COMPREHENSIVE METABOLIC PANEL
Albumin: 4.5 g/dL (ref 3.5–5.2)
Alkaline Phosphatase: 52 U/L (ref 39–117)
BUN: 14 mg/dL (ref 6–23)
Glucose, Bld: 87 mg/dL (ref 70–99)
Potassium: 3.9 mEq/L (ref 3.5–5.3)

## 2008-07-26 LAB — CEA: CEA: 0.9 ng/mL (ref 0.0–5.0)

## 2008-08-01 ENCOUNTER — Encounter: Payer: Self-pay | Admitting: Internal Medicine

## 2008-08-01 ENCOUNTER — Encounter: Payer: Self-pay | Admitting: Family Medicine

## 2008-08-05 ENCOUNTER — Ambulatory Visit: Payer: Self-pay | Admitting: Internal Medicine

## 2008-08-10 ENCOUNTER — Ambulatory Visit: Payer: Self-pay | Admitting: Thoracic Surgery

## 2008-09-05 ENCOUNTER — Ambulatory Visit: Payer: Self-pay | Admitting: Internal Medicine

## 2008-10-14 ENCOUNTER — Ambulatory Visit: Payer: Self-pay | Admitting: Family Medicine

## 2009-02-01 ENCOUNTER — Ambulatory Visit: Payer: Self-pay | Admitting: Internal Medicine

## 2009-02-01 DIAGNOSIS — J4541 Moderate persistent asthma with (acute) exacerbation: Secondary | ICD-10-CM

## 2009-02-08 ENCOUNTER — Ambulatory Visit: Payer: Self-pay | Admitting: Thoracic Surgery

## 2009-02-08 ENCOUNTER — Ambulatory Visit (HOSPITAL_COMMUNITY): Admission: RE | Admit: 2009-02-08 | Discharge: 2009-02-08 | Payer: Self-pay | Admitting: Thoracic Surgery

## 2009-07-03 ENCOUNTER — Encounter: Admission: RE | Admit: 2009-07-03 | Discharge: 2009-07-03 | Payer: Self-pay | Admitting: Oncology

## 2009-07-13 ENCOUNTER — Ambulatory Visit: Payer: Self-pay | Admitting: Family Medicine

## 2009-07-21 ENCOUNTER — Ambulatory Visit: Payer: Self-pay | Admitting: Oncology

## 2009-07-25 ENCOUNTER — Ambulatory Visit (HOSPITAL_COMMUNITY): Admission: RE | Admit: 2009-07-25 | Discharge: 2009-07-25 | Payer: Self-pay | Admitting: Oncology

## 2009-07-31 ENCOUNTER — Ambulatory Visit: Payer: Self-pay | Admitting: Internal Medicine

## 2009-08-01 ENCOUNTER — Encounter: Payer: Self-pay | Admitting: Family Medicine

## 2009-08-01 ENCOUNTER — Encounter: Payer: Self-pay | Admitting: Internal Medicine

## 2009-08-09 ENCOUNTER — Ambulatory Visit: Payer: Self-pay | Admitting: Thoracic Surgery

## 2009-09-27 ENCOUNTER — Ambulatory Visit: Payer: Self-pay | Admitting: Family Medicine

## 2009-09-27 ENCOUNTER — Telehealth: Payer: Self-pay | Admitting: *Deleted

## 2009-09-27 ENCOUNTER — Encounter: Payer: Self-pay | Admitting: Family Medicine

## 2009-09-27 DIAGNOSIS — K644 Residual hemorrhoidal skin tags: Secondary | ICD-10-CM

## 2009-09-27 HISTORY — DX: Residual hemorrhoidal skin tags: K64.4

## 2009-09-27 LAB — CONVERTED CEMR LAB
Albumin: 4.3 g/dL (ref 3.5–5.2)
Alkaline Phosphatase: 70 units/L (ref 39–117)
BUN: 10 mg/dL (ref 6–23)
Calcium: 8.8 mg/dL (ref 8.4–10.5)
Chlamydia, DNA Probe: NEGATIVE
Glucose, Bld: 80 mg/dL (ref 70–99)
HCV Ab: NEGATIVE
Potassium: 3.7 meq/L (ref 3.5–5.3)

## 2009-11-22 ENCOUNTER — Encounter: Payer: Self-pay | Admitting: Family Medicine

## 2009-12-26 ENCOUNTER — Encounter: Payer: Self-pay | Admitting: Family Medicine

## 2010-01-26 ENCOUNTER — Ambulatory Visit: Payer: Self-pay | Admitting: Internal Medicine

## 2010-01-26 DIAGNOSIS — J3089 Other allergic rhinitis: Secondary | ICD-10-CM

## 2010-01-26 DIAGNOSIS — J302 Other seasonal allergic rhinitis: Secondary | ICD-10-CM | POA: Insufficient documentation

## 2010-01-31 ENCOUNTER — Ambulatory Visit: Payer: Self-pay | Admitting: Thoracic Surgery

## 2010-01-31 ENCOUNTER — Ambulatory Visit (HOSPITAL_COMMUNITY): Admission: RE | Admit: 2010-01-31 | Discharge: 2010-01-31 | Payer: Self-pay | Admitting: Thoracic Surgery

## 2010-02-14 ENCOUNTER — Ambulatory Visit: Payer: Self-pay | Admitting: Internal Medicine

## 2010-02-18 ENCOUNTER — Encounter: Payer: Self-pay | Admitting: Family Medicine

## 2010-04-18 ENCOUNTER — Ambulatory Visit: Payer: Self-pay | Admitting: Family Medicine

## 2010-06-05 ENCOUNTER — Telehealth: Payer: Self-pay | Admitting: Internal Medicine

## 2010-06-05 ENCOUNTER — Encounter: Payer: Self-pay | Admitting: Family Medicine

## 2010-06-25 ENCOUNTER — Ambulatory Visit: Payer: Self-pay | Admitting: Family Medicine

## 2010-07-04 ENCOUNTER — Encounter: Admission: RE | Admit: 2010-07-04 | Discharge: 2010-07-04 | Payer: Self-pay | Admitting: Oncology

## 2010-07-13 ENCOUNTER — Encounter: Payer: Self-pay | Admitting: Family Medicine

## 2010-07-17 ENCOUNTER — Ambulatory Visit: Payer: Self-pay | Admitting: Thoracic Surgery

## 2010-07-17 ENCOUNTER — Encounter: Admission: RE | Admit: 2010-07-17 | Discharge: 2010-07-17 | Payer: Self-pay | Admitting: Oncology

## 2010-07-17 ENCOUNTER — Encounter: Payer: Self-pay | Admitting: Internal Medicine

## 2010-07-17 ENCOUNTER — Ambulatory Visit (HOSPITAL_COMMUNITY): Admission: RE | Admit: 2010-07-17 | Discharge: 2010-07-17 | Payer: Self-pay | Admitting: Internal Medicine

## 2010-07-23 ENCOUNTER — Ambulatory Visit: Payer: Self-pay | Admitting: Oncology

## 2010-07-25 ENCOUNTER — Ambulatory Visit (HOSPITAL_COMMUNITY)
Admission: RE | Admit: 2010-07-25 | Discharge: 2010-07-25 | Payer: Self-pay | Source: Home / Self Care | Admitting: Oncology

## 2010-07-25 LAB — COMPREHENSIVE METABOLIC PANEL
Albumin: 3.8 g/dL (ref 3.5–5.2)
Alkaline Phosphatase: 88 U/L (ref 39–117)
BUN: 8 mg/dL (ref 6–23)
CO2: 22 mEq/L (ref 19–32)
Calcium: 8.7 mg/dL (ref 8.4–10.5)
Chloride: 106 mEq/L (ref 96–112)
Glucose, Bld: 105 mg/dL — ABNORMAL HIGH (ref 70–99)
Potassium: 3.6 mEq/L (ref 3.5–5.3)
Sodium: 138 mEq/L (ref 135–145)
Total Protein: 7 g/dL (ref 6.0–8.3)

## 2010-07-25 LAB — CBC WITH DIFFERENTIAL/PLATELET
BASO%: 0.4 % (ref 0.0–2.0)
MCHC: 34.7 g/dL (ref 31.5–36.0)
MONO#: 0.5 10*3/uL (ref 0.1–0.9)
RBC: 4.41 10*6/uL (ref 3.70–5.45)
WBC: 7.5 10*3/uL (ref 3.9–10.3)
lymph#: 1 10*3/uL (ref 0.9–3.3)

## 2010-07-27 ENCOUNTER — Telehealth: Payer: Self-pay | Admitting: Internal Medicine

## 2010-07-27 ENCOUNTER — Ambulatory Visit: Payer: Self-pay | Admitting: Internal Medicine

## 2010-08-01 ENCOUNTER — Encounter: Payer: Self-pay | Admitting: Internal Medicine

## 2010-08-01 ENCOUNTER — Encounter: Payer: Self-pay | Admitting: Family Medicine

## 2010-08-02 ENCOUNTER — Telehealth: Payer: Self-pay | Admitting: Internal Medicine

## 2010-08-14 ENCOUNTER — Telehealth: Payer: Self-pay | Admitting: Internal Medicine

## 2010-08-17 ENCOUNTER — Ambulatory Visit: Payer: Self-pay | Admitting: Internal Medicine

## 2010-08-17 DIAGNOSIS — J8482 Adult pulmonary Langerhans cell histiocytosis: Secondary | ICD-10-CM | POA: Insufficient documentation

## 2010-08-24 ENCOUNTER — Ambulatory Visit
Admission: RE | Admit: 2010-08-24 | Discharge: 2010-08-24 | Payer: Self-pay | Source: Home / Self Care | Attending: Internal Medicine | Admitting: Internal Medicine

## 2010-08-24 ENCOUNTER — Encounter: Payer: Self-pay | Admitting: Internal Medicine

## 2010-08-24 HISTORY — PX: BRONCHOSCOPY: SUR163

## 2010-09-04 ENCOUNTER — Ambulatory Visit (HOSPITAL_COMMUNITY)
Admission: RE | Admit: 2010-09-04 | Discharge: 2010-09-04 | Payer: Self-pay | Source: Home / Self Care | Attending: Thoracic Surgery | Admitting: Thoracic Surgery

## 2010-09-04 ENCOUNTER — Encounter: Payer: Self-pay | Admitting: Internal Medicine

## 2010-09-04 ENCOUNTER — Ambulatory Visit
Admission: RE | Admit: 2010-09-04 | Discharge: 2010-09-04 | Payer: Self-pay | Source: Home / Self Care | Attending: Thoracic Surgery | Admitting: Thoracic Surgery

## 2010-09-18 ENCOUNTER — Encounter: Payer: Self-pay | Admitting: Internal Medicine

## 2010-09-18 ENCOUNTER — Other Ambulatory Visit: Payer: Self-pay | Admitting: Internal Medicine

## 2010-09-18 ENCOUNTER — Ambulatory Visit
Admission: RE | Admit: 2010-09-18 | Discharge: 2010-09-18 | Payer: Self-pay | Source: Home / Self Care | Attending: Internal Medicine | Admitting: Internal Medicine

## 2010-09-18 LAB — CBC WITH DIFFERENTIAL/PLATELET
Basophils Absolute: 0 10*3/uL (ref 0.0–0.1)
Basophils Relative: 0.4 % (ref 0.0–3.0)
Eosinophils Absolute: 0.1 10*3/uL (ref 0.0–0.7)
Eosinophils Relative: 1.2 % (ref 0.0–5.0)
HCT: 39.7 % (ref 36.0–46.0)
Hemoglobin: 14 g/dL (ref 12.0–15.0)
Lymphocytes Relative: 11.5 % — ABNORMAL LOW (ref 12.0–46.0)
Lymphs Abs: 1 10*3/uL (ref 0.7–4.0)
MCHC: 35.3 g/dL (ref 30.0–36.0)
MCV: 93.1 fl (ref 78.0–100.0)
Monocytes Absolute: 0.6 10*3/uL (ref 0.1–1.0)
Monocytes Relative: 7 % (ref 3.0–12.0)
Neutro Abs: 6.7 10*3/uL (ref 1.4–7.7)
Neutrophils Relative %: 79.9 % — ABNORMAL HIGH (ref 43.0–77.0)
Platelets: 250 10*3/uL (ref 150.0–400.0)
RBC: 4.26 Mil/uL (ref 3.87–5.11)
RDW: 13.6 % (ref 11.5–14.6)
WBC: 8.4 10*3/uL (ref 4.5–10.5)

## 2010-09-22 ENCOUNTER — Other Ambulatory Visit: Payer: Self-pay | Admitting: Oncology

## 2010-09-22 DIAGNOSIS — C3490 Malignant neoplasm of unspecified part of unspecified bronchus or lung: Secondary | ICD-10-CM

## 2010-09-23 ENCOUNTER — Encounter: Payer: Self-pay | Admitting: Oncology

## 2010-09-23 ENCOUNTER — Encounter: Payer: Self-pay | Admitting: Thoracic Surgery

## 2010-10-04 NOTE — Assessment & Plan Note (Signed)
Summary: ROV 3 WKS- OK PER CY ///KP   Primary Provider/Referring Provider:  Barnabas Lister  MD  CC:  3 week follow up visit-discuss bronch.  History of Present Illness: July 27, 2010- Asthmatic bronchitis/ COPD, hx lung CA/ RUL/ 2003 Nurse-CC: 6 month follow up visit-chest congestion; cough-productive-yellow green in color. Dr Edwyna Shell had reported recent abnl CXR and she comes for f/u. She has felt more congested for about 3 weeks with some mucus/ plugs. Some night sweats, sinus drainage,.No fever or chills, blood or nodes. Trying to lose some weight= few lbs down. No chemo since about 2003. Hx RULobect and XRT originally. No recurrence. No hx TBX. PPDs were neg. She has not resumed smoking, but boyfriend smokes heavily and heats with wood stove. Had flu vax. Had pneumovax 2-3 years ago. CT - 07/17/10-I reviewed images. New diffuse cavitary nodular process w/ peribronchial thickening. Old radiation changes including a right paraspinal consolidation, are unchanged.  CXR- Similar to CT as described. PFT 02/14/10- Normal measured lung volumes, which seems unlikely unless counterbalalnced restriction and hyperinflation. DLCO moderately reduced.   August 17, 2010-  Asthmatic bronchitis/ COPD, hx lung CA/ RUL/ 2003, interst pneumonia Nurse-CC: 3 week follow up visit-discuss bronch Notices sinus drainage, but couldn't cough up a deep sputum sample for cultures.  We discussed bronchoscopy with transbronchial biopsy to assess this diffuse parenchymal disease and we related it to her previous bronchoscopies.  Denies blood, bruising, chest pain, palpittation, nausea, vomiting, diarrhea or dysuria.    Preventive Screening-Counseling & Management  Alcohol-Tobacco     Alcohol drinks/day: <1     Alcohol type: beer     Smoking Status: quit > 6 months     Packs/Day: 3.0     Year Started: age 54     Year Quit: 2003     Passive Smoke Exposure: yes     Tobacco Counseling: to remain off tobacco  products     Passive Smoke Counseling: to avoid passive smoke exposure  Current Medications (verified): 1)  Ambien 10 Mg Tabs (Zolpidem Tartrate) .Marland Kitchen.. 1 Tablet By Mouth Every Night 2)  Asmanex 60 Metered Doses 220 Mcg/inh  Aepb (Mometasone Furoate) .Marland Kitchen.. 1 Puff and Rinse Twice Daily 3)  Foradil Aerolizer 12 Mcg  Caps (Formoterol Fumarate) .... Use Twice Daily 4)  Xopenex Hfa 45 Mcg/act  Aero (Levalbuterol Tartrate) .... As Needed 5)  Topamax 50 Mg Tabs (Topiramate) .... Take 1 and 1/2 Tablets By Mouth Bid 6)  Alprazolam 0.5 Mg  Tabs (Alprazolam) .... One and One Half Tabs By Mouth Three Times A Day As Needed Anxiety 7)  Aleve 220 Mg Tabs (Naproxen Sodium) .... As Needed 8)  Xopenex 0.63 Mg/28ml Nebu (Levalbuterol Hcl) .Marland Kitchen.. 1 Neb Four Times A Day As Needed  Allergies (verified): No Known Drug Allergies  Review of Systems      See HPI       The patient complains of shortness of breath with activity, non-productive cough, and anxiety.  The patient denies shortness of breath at rest, productive cough, coughing up blood, chest pain, irregular heartbeats, acid heartburn, indigestion, loss of appetite, weight change, abdominal pain, difficulty swallowing, sore throat, tooth/dental problems, headaches, nasal congestion/difficulty breathing through nose, sneezing, itching, ear ache, rash, change in color of mucus, and fever.    Vital Signs:  Patient profile:   45 year old female Height:      65 inches Weight:      130.13 pounds BMI:     21.73 O2  Sat:      97 % on Room air Pulse rate:   86 / minute BP sitting:   118 / 70  (left arm) Cuff size:   regular  Vitals Entered By: Reynaldo Minium CMA (August 17, 2010 11:35 AM)  O2 Flow:  Room air CC: 3 week follow up visit-discuss bronch   Physical Exam  Additional Exam:  General: A/Ox3; pleasant and cooperative, NAD, talkative SKIN: no rash, lesions, chronic flushed appearance. NODES: no lymphadenopathy HEENT: [place]/AT, EOM- WNL, Conjuctivae-  clear, PERRLA, TM-WNL, Nose- clears, Throat- clear , Mallampati II NECK: Supple w/ fair ROM, JVD- none, normal carotid impulses w/o bruits Thyroid-  CHEST: quiet  breath sounds, no cough, somewhat diminished,  HEART: RRR, no m/g/r heard ABDOMEN- not obese. 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 We don't think it is interstitial cancer but all possibilities are open. We have again discussed the risks, techinique and goals of bronchoscopy. We will set this up.   Orders: Est. Patient Level IV (30865) Misc. Referral (Misc. Ref)  Problem # 2:  Hx of LUNG CANCER (ICD-162.9) Radiation therapy changes on her imaging studies were compared by me,  without progression seen.  Patient Instructions: 1)  Please schedule a follow-up appointment in 3 weeks. 2)  See [facility] to set up bronchoscopy. You will need to be off aspirin for 3 days ahead. You will need to figure on being at the hospital about 3 hours door to door and having someone to drive you home.

## 2010-10-04 NOTE — Medication Information (Signed)
Summary: [employer]   Imported By: Valinda Hoar 07/27/2010 15:07:01  _____________________________________________________________________  External Attachment:    Type:   Image     Comment:   External Document

## 2010-10-04 NOTE — Assessment & Plan Note (Signed)
Summary: 6 months/apc   Primary Provider/Referring Provider:  Lequita Asal  MD  CC:  Follow up visit-sneezing, slight wheezing and SOB, and slight nasal drainage-yellow..  History of Present Illness: 09/06/07-Asthmatic bronchits/ COPD, hx lung Cancer/ RUL 4-5 days cough productive of brown sputum. Tussive nose bleeds. Poor by mouth intake, anorxia with weight loss/. Cough til she sweats but no definite fever.Denies abd pain, N/V, diarrhea. Had both flu vax.  02/01/09- Asthmatic bronchitis/ COPD, hx lung CA/ RUL Head stopped up x 3 days. Eyes irritated, ear pressure. Frontal headche yesterday. Occasional sharp twinges right parasternal area comes and goes- she relates to her cancer therapy. Some yellowish mucus from nose or chest but no blood or fever.  July 31, 2009- Asthmatic bronchitis/ COPD, hx lung Cancer/RUL Thinks she is doing well with no changes. Just had a f/u CT with result pending.  Denies change in breathing. Coughs esp in AMs productively- morning cough. Uses Asmanaex and Foradil every day and Xopenex most days. She has inherited a nebulizer machine and we discussed indications for use. Had flu vax.  Jan 26, 2010- Asthmatic bronchitis/ COPD, hx lung CA/ RUL/ 2003 Complains of pollen rhinitis- sneeze,drip- yellow.. Not yet needing antihistamine. Has been outside alot in sun- discused skin protection. Wheeze 2x/week. Dyspnea with exertion stairs, low inclines, long parking lot- remains stable. No formal exercise. Walks some with daughter 55 yo. Can't tolerate albuterol rescue inhalers- tremor and overstimulation. Asks we continue Xopenex HFA. SABA 2x/ week. Denies nodes, lumps, blood, chest pain, palpitation. Due for f/u CXR w/ Dr Edwyna Shell for cancer f/u.  Preventive Screening-Counseling & Management  Alcohol-Tobacco     Smoking Status: quit > 6 months  Current Medications (verified): 1)  Ambien 10 Mg Tabs (Zolpidem Tartrate) .Marland Kitchen.. 1 Tablet By Mouth Every Night 2)  Imitrex  50 Mg Tabs (Sumatriptan Succinate) .... Take 1 Tablet By Mouth As Directed 3)  Asmanex 60 Metered Doses 220 Mcg/inh  Aepb (Mometasone Furoate) .Marland Kitchen.. 1 Puff and Rinse Twice Daily 4)  Foradil Aerolizer 12 Mcg  Caps (Formoterol Fumarate) .... Use Twice Daily 5)  Xopenex Hfa 45 Mcg/act  Aero (Levalbuterol Tartrate) .... As Needed 6)  Topamax 50 Mg Tabs (Topiramate) .... Take 1 and 1/2 Tablets By Mouth Bid 7)  Alprazolam 0.5 Mg  Tabs (Alprazolam) .... One and One Half Tabs By Mouth Three Times A Day As Needed Anxiety 8)  Aleve 220 Mg Tabs (Naproxen Sodium) .... As Needed 9)  Omeprazole 20 Mg Cpdr (Omeprazole) .... One By Mouth Daily 10)  Xopenex 0.63 Mg/5ml Nebu (Levalbuterol Hcl) .Marland Kitchen.. 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 Alean Rinne  Past Surgical History: Last updated: 08/05/2008 r lung sx (lung ca), adenocarcinoma -  07/03/2002, spirometry - min. airway obstruc, mild restriction - 01/31/2005,  TVH - 12/01/2000, portacath removed 10/2007 right neck mass bx'd negative.  Family History: Last updated: 2008-02-08 Dad - lung ca (deceased) Daughter - recurrent UTIs [facility] - DM, Mom - healthy mother alive age 70  Social History: Last updated: 09/27/2009 Lives w/ her  20 yo daughter Constance Haw- also pt here).  Husband died in Jan 17, 2001 of MVA. Unemployed. Quit smoking: 2 packs a day for 23 years (quit in 17-Jan-2002 when diagnosed with lung ca).  Hx  of EtOH abuse 20' to 30'.   +MJ Cousin Dorthey Sawyer- Family Medicine in [place]  Risk Factors: Alcohol Use: <1 (11/18/2007) Exercise: yes (11/18/2007)  Risk Factors: Smoking Status: quit > 6 months (01/26/2010)  Review of Systems      See HPI       The patient complains of shortness of breath with activity and nasal congestion/difficulty breathing through nose.  The patient denies shortness of breath at rest, non-productive cough, coughing up blood, chest pain, irregular heartbeats, acid heartburn, indigestion, loss of appetite, weight change, abdominal pain, difficulty swallowing, sore throat, tooth/dental problems, headaches, and sneezing.         Cough occasionally productive white.  Vital Signs:  Patient profile:   45 year old female Height:      65 inches Weight:      141 pounds BMI:     23.55 O2 Sat:      97 % on Room air Pulse rate:   102 / minute BP sitting:   112 / 66  (left arm) Cuff size:   regular  Vitals Entered By: Reynaldo Minium CMA (Jan 26, 2010 9:44 AM)  O2 Flow:  Room air  Physical Exam  Additional Exam:  General: A/Ox3; pleasant and cooperative, NAD, talkative SKIN: no rash, lesions, over-tanned NODES: no lymphadenopathy HEENT: [place]/AT, EOM- WNL, Conjuctivae- clear, PERRLA, TM-WNL, Nose- clears, Throat- clear , Mallampati II NECK: Supple w/ fair ROM, JVD- none, normal carotid impulses w/o bruits Thyroid-  CHEST: Clear, somewhat diminished,  HEART: RRR, no m/g/r heard ABDOMEN HYQ:MVHQ, nl pulses, no edema  NEURO: Grossly intact to observation      Impression & Recommendations:  Problem # 1:  BRONCHITIS, CHRONIC (ICD-491.9)  We will renew [facility] parking when needed. Good control. Needs to walk for endurance. We will update PFT.  Problem # 2:  Hx of LUNG CANCER (ICD-162.9) discussed status- no recurrence.  Problem # 3:  ALLERGIC RHINITIS (ICD-477.9)  Mild exacerbation- She can use otc if needed.  Other Orders: Est. Patient Level IV  (46962)  Patient Instructions: 1)  Please schedule a follow-up appointment in 6 months. 2)  Schedule PFT 3)  Renew [facility] parking as needed

## 2010-10-04 NOTE — Progress Notes (Signed)
SummaryPauline Aus PA -APPROVED 07-27-10 THROUGH 07-28-11  Phone Note Outgoing Call   Call placed by: Reynaldo Minium CMA,  July 27, 2010 3:03 PM Call placed to: PA [phone] Summary of Call: [facility] [address]ed PA for patients Xopenex HFA inhaler. Called PA dept and RX approved for 1 year. Pharmacy is aware and Rx went through. Left message on pts home number that this has been taken care of. Initial call taken by: Reynaldo Minium CMA,  July 27, 2010 3:07 PM

## 2010-10-04 NOTE — Letter (Signed)
Summary: [facility]  [facility]   Imported By: Sherian Rein 08/10/2010 07:14:54  _____________________________________________________________________  External Attachment:    Type:   Image     Comment:   External Document

## 2010-10-04 NOTE — Miscellaneous (Signed)
Summary: problem list  Clinical Lists Changes  Problems: Removed problem of GERD (ICD-530.81)

## 2010-10-04 NOTE — Progress Notes (Signed)
Summary: handicap placard  Phone Note Call from Patient Call back at Home Phone [phone]   Caller: Patient Call For: young Summary of Call: pt dropped off handicap placard renewal. i have advised Florentina Addison that this is on my desk for her. pt wants this signed today please and then mailed to pt's home.  Initial call taken by: Tivis Ringer, CNA,  June 05, 2010 12:24 PM  Follow-up for Phone Call        Placed paper and message on CDY's cart to sign.Reynaldo Minium CMA  June 05, 2010 12:40 PM   Additional Follow-up for Phone Call Additional follow up Details #1::        Signed by CDY and mailed to patient.Reynaldo Minium CMA  June 05, 2010 4:38 PM

## 2010-10-04 NOTE — Assessment & Plan Note (Signed)
Summary: 3 week rov   Primary Provider/Referring Provider:  Barnabas Lister  MD  CC:  3 week follow up visit-review Bronch results..  History of Present Illness: August 17, 2010-  Asthmatic bronchitis/ COPD, hx lung CA/ RUL/ 05-Feb-2002, interst pneumonia Nurse-CC: 3 week follow up visit-discuss bronch Notices sinus drainage, but couldn't cough up a deep sputum sample for cultures.  We discussed bronchoscopy with transbronchial biopsy to assess this diffuse parenchymal disease and we related it to her previous bronchoscopies.  Denies blood, bruising, chest pain, palpittation, nausea, vomiting, diarrhea or dysuria.   September 18, 2010- Asthmatic bronchitis/ COPD, hx lung CA/ RUL/ 2002/02/05, interst pneumonia Nurse-CC: 3 week follow up visit-review Bronch results. Feels ok except some nasal congestion with postnasal drip but no headache or sore throat. . Coughs productively- green yellow, no blood. Denies fever, chills, nodes. Boyfriend's daughter visited x 3 weeks with cough just recently but now gone, and that visit was long after current illness began for Ms Cerutti in the Fall. Dr Edwyna Shell did CXR @ [facility] reporting stable or slightly improved.  Bronchoscopy 08/24/10- reactive epithelium. Cultures neg,no granulomas. We reviewed her case at Thoracic conference with no diagnosis offered by Pathology or Radiology.    Preventive Screening-Counseling & Management  Alcohol-Tobacco     Alcohol drinks/day: <1     Alcohol type: beer     Smoking Status: quit > 6 months     Packs/Day: 3.0     Year Started: age 81     Year Quit: 02/05/2002     Passive Smoke Exposure: yes     Tobacco Counseling: to remain off tobacco products     Passive Smoke Counseling: to avoid passive smoke exposure  Current Medications (verified): 1)  Ambien 10 Mg Tabs (Zolpidem Tartrate) .Marland Kitchen.. 1 Tablet By Mouth Every Night 2)  Asmanex 60 Metered Doses 220 Mcg/inh  Aepb (Mometasone Furoate) .Marland Kitchen.. 1 Puff and Rinse Twice Daily 3)  Foradil  Aerolizer 12 Mcg  Caps (Formoterol Fumarate) .... Use Twice Daily 4)  Xopenex Hfa 45 Mcg/act  Aero (Levalbuterol Tartrate) .... As Needed 5)  Topamax 50 Mg Tabs (Topiramate) .... Take 1 and 1/2 Tablets By Mouth Bid 6)  Alprazolam 0.5 Mg  Tabs (Alprazolam) .... One and One Half Tabs By Mouth Three Times A Day As Needed Anxiety 7)  Aleve 220 Mg Tabs (Naproxen Sodium) .... As Needed 8)  Xopenex 0.63 Mg/38ml Nebu (Levalbuterol Hcl) .Marland Kitchen.. 1 Neb Four Times A Day As Needed  Allergies (verified): No Known Drug Allergies  Past History:  Family History: Last updated: 02-28-08 Dad - lung ca (deceased) Daughter - recurrent UTIs Gfa - DM, Mom - healthy mother alive age 84  Social History: Last updated: 09/27/2009 Lives w/ her 58 yo daughter Constance Haw- also pt here).  Husband died in 05-Feb-2001 of MVA. Unemployed. Quit smoking: 2 packs a day for 23 years (quit in 05-Feb-2002 when diagnosed with lung ca).  Hx of EtOH abuse 20' to 30'.   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 Terasa Orsini and Burney Interstitial lung disease 2011- bronchoscopy 08/24/10- nonspecific inflammation  Past Surgical History: r lung sx (lung ca), adenocarcinoma -  07/03/2002, spirometry - min. airway obstruc, mild restriction - 01/31/2005,  TVH -  12/01/2000, portacath removed 10/2007 right neck mass bx'd negative. Bronchoscopy 08/24/10- nonspecific inflammation  Review of Systems      See HPI  The patient denies anorexia, fever, weight loss, weight gain, vision loss, decreased hearing, hoarseness, chest pain, syncope, dyspnea on exertion, peripheral edema, prolonged cough, headaches, hemoptysis, abdominal pain, severe indigestion/heartburn, enlarged lymph nodes, and angioedema.    Vital Signs:  Patient profile:   45 year old female Height:      65 inches Weight:      129.25 pounds BMI:     21.59 O2 Sat:      98 % on Room air Pulse rate:   111 / minute BP sitting:   100 / 60  (left arm) Cuff size:   regular  Vitals Entered By: Reynaldo Minium CMA (September 18, 2010 10:10 AM)  O2 Flow:  Room air CC: 3 week follow up visit-review Bronch results.   Physical Exam  Additional Exam:  General: A/Ox3; pleasant and cooperative, NAD, talkative SKIN: no rash, lesions, chronic flushed appearance. 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 The Thoracic conference on review of films, path and hx had not had a dx to offer but suggested HIV check. She does not consider herself at risk but agrees after appropriate discussion, to blood test.  Absent more to go on and with stability and improvenment, I will not attempt to treat with steroids or etc for now.  Consider possibility this is UIP or similar.  Possible that this is an  irritant reponse to viral infection or to the woodsmoke in her boyfriend's home. Plan observation.  Problem # 2:  ALLERGIC RHINITIS (ICD-477.9) Noticing some dry nose mostly. I don't think there is a diffuse process sucha as a Wegener's vasculitis, We can do labs later, but she is clinically imporoved for now.   Other Orders: Est. Patient Level IV (45409) T-HIV Antibody  (Reflex) ([id number]) TLB-CBC Platelet - w/Differential (85025-CBCD)  Patient Instructions: 1)  Please schedule a follow-up appointment in 2 months.  Please call if needed sooner 2)  Lab 3)  OK to rinse nose with saline nose spray, and  use an otc antihistamine like loratadine as needed.

## 2010-10-04 NOTE — Consult Note (Signed)
Summary: [facility] Hemat[facility]/Oncology  [facility] Hematology/Oncology   Imported By: Clydell Hakim 09/13/2009 15:58:08  _____________________________________________________________________  External Attachment:    Type:   Image     Comment:   External Document

## 2010-10-04 NOTE — Progress Notes (Signed)
Summary: wet prep results  Phone Note Outgoing Call   Summary of Call: please inform that she has BV and that it is NOT a sexually transmitted infection. rx for clindamycin sent to pharmacy. all other labs were negative. also needs surgery referral for hemorrhoids  Initial call taken by: Lequita Asal  MD,  September 27, 2009 11:19 PM  New Problems: EXTERNAL HEMORRHOIDS (ICD-455.3)   New Problems: EXTERNAL HEMORRHOIDS (ICD-455.3) New/Updated Medications: CLINDAMYCIN HCL 300 MG CAPS (CLINDAMYCIN HCL) one tab by mouth two times a day x7 days Prescriptions: CLINDAMYCIN HCL 300 MG CAPS (CLINDAMYCIN HCL) one tab by mouth two times a day x7 days  #14 x 0   Entered and Authorized by:   Lequita Asal  MD   Signed by:   Lequita Asal  MD on 09/28/2009   Method used:   [facility]  [address] [id number]       [address]       [place], [place]  [address]       Ph: [phone]       Fax: [phone]   RxID:   [id number] FLAGYL 500 MG TABS (METRONIDAZOLE) One tab by mouth two times a day x7 days  #14 x 0   Entered and Authorized by:   Lequita Asal  MD   Signed by:   Lequita Asal  MD on 09/27/2009   Method used:   [facility]        [facility]  [address] [id number]       [address]       [place], [place]  [address]       Ph: [phone]       Fax: [phone]   RxID:   [id number]  called and informed pt of results and told her that she can go pick up the Rx at her convience. I will call her back with the surgical Doroteo Bradford CMA  September 28, 2009 2:11 PM

## 2010-10-04 NOTE — Consult Note (Signed)
Summary: [facility]   Imported By: Clydell Hakim 12/21/2009 12:10:08  _____________________________________________________________________  External Attachment:    Type:   Image     Comment:   External Document

## 2010-10-04 NOTE — Assessment & Plan Note (Signed)
Summary: flu shot/eo  Nurse Visit   Vital Signs:  Patient profile:   45 year old female Temp:     98.9 degrees F  Vitals Entered By: Theresia Lo RN (June 25, 2010 9:00 AM)  Allergies: No Known Drug Allergies  Immunizations Administered:  Influenza Vaccine # 1:    Vaccine Type: Fluvax MCR    Site: left deltoid    Mfr: GlaxoSmithKline    Dose: 0.5 ml    Route: IM    Given by: Theresia Lo RN    Exp. Date: 02/27/2011    Lot #: [id number]    VIS given: 03/27/10 version given June 25, 2010.  Flu Vaccine Consent Questions:    Do you have a history of severe allergic reactions to this vaccine? no    Any prior history of allergic reactions to egg and/or gelatin? no    Do you have a sensitivity to the preservative Thimersol? no    Do you have a past history of Guillan-Barre Syndrome? no    Do you currently have an acute febrile illness? no    Have you ever had a severe reaction to latex? no    Vaccine information given and explained to patient? yes    Are you currently pregnant? no  Orders Added: 1)  Influenza Vaccine MCR [00025]     Appended Document: flu shot/eo

## 2010-10-04 NOTE — Miscellaneous (Signed)
Summary: Orders Update pft charges  Clinical Lists Changes  Orders: Added new Service order of Carbon Monoxide diffusing w/capacity (94720) - Signed Added new Service order of Spirometry (Pre & Post) (94060) - Signed Added new Service order of Lung Volumes (94240) - Signed 

## 2010-10-04 NOTE — Progress Notes (Signed)
Summary: Phoned patient to discuss bronch   Phone Note Outgoing Call   Summary of Call: I called LMOM. She hasn't been able to cough up a sputum specimen. I need to talk with her about doing a bronchoscopy to find out what is going on in her lung now.  Initial call taken by: Waymon Budge MD,  August 14, 2010 8:08 AM

## 2010-10-04 NOTE — Miscellaneous (Signed)
  Clinical Lists Changes  Problems: Removed problem of CONTACT OR EXPOSURE TO OTHER VIRAL DISEASES (ICD-V01.79) Removed problem of NEED PROPHYLACTIC VACCINATION&INOCULATION FLU (ICD-V04.81) Removed problem of WELL ADULT EXAM (ICD-V70.0) Removed problem of SCREENING FOR MALIGNANT NEOPLASM OF THE CERVIX (ICD-V76.2)
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
## 2010-10-04 NOTE — Consult Note (Signed)
Summary: [facility]   Imported By: Bradly Bienenstock 08/31/2010 12:14:44  _____________________________________________________________________  External Attachment:    Type:   Image     Comment:   External Document

## 2010-10-04 NOTE — Letter (Signed)
Summary: [facility]  [facility]   Imported By: Sherian Rein 08/01/2010 09:36:20  _____________________________________________________________________  External Attachment:    Type:   Image     Comment:   External Document

## 2010-10-04 NOTE — Assessment & Plan Note (Signed)
Summary: refill meds,df   Vital Signs:  Patient profile:   45 year old female Weight:      128.6 pounds Temp:     98.6 degrees F oral Pulse rate:   104 / minute Pulse rhythm:   regular BP sitting:   103 / 75  (right arm) Cuff size:   regular  Vitals Entered By: Loralee Pacas CMA (April 18, 2010 1:43 PM)  Primary Provider:  Barnabas Lister  MD  CC:  refill meds/meet new dr.  History of Present Illness: This is a 45 yo WF with a PMH of lung cancer in remission, asthma, and migraines who comes to clinic for med refills and to meet new Dr.  Rock Nephew has no other complaints today.  Pt sees a pulmonologist for her asthma meds and lung ca f/u.  She says she has lost weight and no longer needs a PPI for reflux symptoms.  States that she needs Ambien, Alprazolam, and Topomax.  Her migraines have been well controlled ever since taking Topomax.  She still experiences some aura, but it never progresses to a migraine.  She reports no adverse effects to any of the meds she takes.  ROS:  Denies CP, SOB, N/V.  Denies fever, chills, diarrhea or constipation.  Current Medications (verified): 1)  Ambien 10 Mg Tabs (Zolpidem Tartrate) .Marland Kitchen.. 1 Tablet By Mouth Every Night 2)  Asmanex 60 Metered Doses 220 Mcg/inh  Aepb (Mometasone Furoate) .Marland Kitchen.. 1 Puff and Rinse Twice Daily 3)  Foradil Aerolizer 12 Mcg  Caps (Formoterol Fumarate) .... Use Twice Daily 4)  Xopenex Hfa 45 Mcg/act  Aero (Levalbuterol Tartrate) .... As Needed 5)  Topamax 50 Mg Tabs (Topiramate) .... Take 1 and 1/2 Tablets By Mouth Bid 6)  Alprazolam 0.5 Mg  Tabs (Alprazolam) .... One and One Half Tabs By Mouth Three Times A Day As Needed Anxiety 7)  Aleve 220 Mg Tabs (Naproxen Sodium) .... As Needed 8)  Xopenex 0.63 Mg/29ml Nebu (Levalbuterol Hcl) .Marland Kitchen.. 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 Alean Rinne  Past Surgical History: Last updated: 08/05/2008 r lung sx (lung ca), adenocarcinoma -  07/03/2002, spirometry - min. airway obstruc, mild restriction - 01/31/2005,  TVH - 12/01/2000, portacath removed 10/2007 right neck mass bx'd negative.  Family History: Last updated: 2008-02-21 Dad - lung ca (deceased) Daughter - recurrent UTIs [facility] - DM, Mom - healthy mother alive age 47  Social History: Last updated: 09/27/2009 Lives w/ her 62 yo daughter Constance Haw- also pt here).  Husband died in 29-Jan-2001 of MVA. Unemployed. Quit smoking: 2 packs a day for 23 years (quit in 2002/01/29 when diagnosed with lung ca).  Hx of EtOH abuse 20' to 30'.   +MJ Cousin Dorthey Sawyer- Family Medicine in [place]  Family History: Reviewed history from Feb 21, 2008 and no changes required. Dad - lung ca (deceased) Daughter - recurrent UTIs [facility] - DM, Mom - healthy mother alive age 59  Social History: Reviewed history from 09/27/2009 and no changes required. Lives w/ her 79 yo  daughter Constance Haw- also pt here).  Husband died in Jan 15, 2001 of MVA. Unemployed. Quit smoking: 2 packs a day for 23 years (quit in 15-Jan-2002 when diagnosed with lung ca).  Hx of EtOH abuse 20' to 30'.   +MJ Cousin Dorthey Sawyer- Family Medicine in [place]  Review of Systems       per HPI  Physical Exam  General:  NAD Eyes:  PERRLA.   Lungs:  Normal respiratory effort, chest expands symmetrically. Lungs are clear to auscultation, except for minimal R wheezes Heart:  Normal rate and regular rhythm. S1 and S2 normal without  gallop, murmur, click, rub or other extra sounds. Abdomen:  Bowel sounds positive,abdomen soft and non-tender    Impression & Recommendations:  Problem # 1:  MIGRAINE, UNSPEC., W/O INTRACTABLE MIGRAINE (ICD-346.90) Assessment Improved  Patient states that she has not had a migraine since taking Topomax.  She does c/o visual changes/aura, but this does not progress into a migraine.  She has stopped taking Imitrex 50mg .  I have her refill for Topomax and discussed side effects, such as weight loss, and advised her to call me if she experiences any adverse effects.    The following medications were removed from the medication list:    Imitrex 50 Mg Tabs (Sumatriptan succinate) .Marland Kitchen... Take 1 tablet by mouth as directed Her updated medication list for this problem includes:    Aleve 220 Mg Tabs (Naproxen sodium) .Marland Kitchen... As needed  Orders: [facility]- Est Level  3 (16109)  Problem # 2:  BRONCHITIS, CHRONIC (ICD-491.9) Assessment: Comment Only  Patient follows up with Pulmonology outpatient.    Orders: [facility]- Est Level  3 (60454)  Problem # 3:  INSOMNIA NOS (ICD-780.52) Assessment: Unchanged  Gave patient refill for Ambien today.  Will discuss this issue at her next visit and consider different options.  Patient also takes Alprazolam for anxiety.    Her updated medication list for this problem includes:    Ambien 10 Mg Tabs (Zolpidem tartrate) .Marland Kitchen... 1 tablet by mouth every night  Orders: [facility]- Est Level  3 (09811)  Problem # 4:  HYPERCHOLESTEROLEMIA (ICD-272.0) Assessment: Unchanged  Patient not at goal per last [facility] in Jan. 2011.  Patient deferred direct LDL and wil come back in January for a FLP.  We will discuss the results of her labs and treatment options at her next CPE, Feb. 2011.  Patient understood the plan and will schedule a CPE early next year.  Orders: [facility]- Est Level  3 ([id number])Future Orders: Lipid-[facility] ([id number]) ... 10/31/2010 Comp Met-[facility] ([id number]) ...  10/31/2010 CBC-[facility] ([id number]) ... 10/31/2010  Problem # 5:  ANXIETY DISORDER (ICD-300.00) Assessment: Unchanged  Patient cannot tolerate SSRIs.  She does not ask for Rx's early nor does she ask for different doses.  She states that her anxiety is better controlled currently and she does not take the Alprazolam everyday.  I will evaluate this issue further at her next visit, and attempt to wean her off benzos and restart SSRIs.   Her updated medication list for this problem includes:    Alprazolam 0.5 Mg Tabs (Alprazolam) ..... One and one half tabs by mouth three times a day as needed anxiety  Orders: [facility]- Est Level  3 ([id number])  Problem # 6:  Preventive Health Care (ICD-V70.0) Assessment: Unchanged Patient's LDL not at goal on Jan, 2011.  Will get a FLP in 6 months.  Will discuss results with patient at next visit.  May consider adding statin therapy if LDL is  not at goal.  Complete Medication List: 1)  Ambien 10 Mg Tabs (Zolpidem tartrate) .Marland Kitchen.. 1 tablet by mouth every night 2)  Asmanex 60 Metered Doses 220 Mcg/inh Aepb (Mometasone furoate) .Marland Kitchen.. 1 puff and rinse twice daily 3)  Foradil Aerolizer 12 Mcg Caps (Formoterol fumarate) .... Use twice daily 4)  Xopenex Hfa 45 Mcg/act Aero (Levalbuterol tartrate) .... As needed 5)  Topamax 50 Mg Tabs (Topiramate) .... Take 1 and 1/2 tablets by mouth bid 6)  Alprazolam 0.5 Mg Tabs (Alprazolam) .... One and one half tabs by mouth three times a day as needed anxiety 7)  Aleve 220 Mg Tabs (Naproxen sodium) .... As needed 8)  Xopenex 0.63 Mg/40ml Nebu (Levalbuterol hcl) .Marland Kitchen.. 1 neb four times a day as needed  Patient Instructions: 1)  Please schedule an appointment to see me in February 2012. 2)  Please come one week prior to appt for fasting labs.  Do not eat or drink after midnight. Prescriptions: ALPRAZOLAM 0.5 MG  TABS (ALPRAZOLAM) one and one half tabs by mouth three times a day as needed anxiety  #135 x 5   Entered and Authorized by:   Madisun Hargrove de Lawson Radar  MD   Signed by:   Jasa Dundon de Lawson Radar  MD on 04/18/2010   Method used:   Print then Give to Patient   RxID:   [id number] TOPAMAX 50 MG TABS (TOPIRAMATE) take 1 and 1/2 tablets by mouth BID  #90 x 5   Entered and Authorized by:   Sricharan Lacomb de Lawson Radar  MD   Signed by:   Zayvier Caravello de Lawson Radar  MD on 04/18/2010   Method used:   Print then Give to Patient   RxID:   [id number] AMBIEN 10 MG TABS (ZOLPIDEM TARTRATE) 1 tablet by mouth every night  #30 x 5   Entered and Authorized by:   Elias Bordner de Lawson Radar  MD   Signed by:   Barnabas Lister  MD on 04/18/2010   Method used:   Print then Give to Patient   RxID:   [id number]    Prevention & Chronic Care Immunizations   Influenza vaccine: Fluvax MCR  (07/13/2009)   Influenza vaccine deferral: Not available  (02/18/2010)   Influenza vaccine due: 06/03/2009    Tetanus booster: 12/31/2001: Done.   Tetanus booster due: 01/01/2012    Pneumococcal vaccine: Not documented   Pneumococcal vaccine deferral: Not indicated  (09/27/2009)  Other Screening   Pap smear: Hysterectomy  (06/03/2008)   Pap smear due: Not Indicated    Mammogram: ASSESSMENT: Negative - BI-RADS 1^MM DIGITAL SCREENING  (07/03/2009)   Mammogram due: 06/30/2009   Smoking status: quit > 6 months  (01/26/2010)  Lipids   Total Cholesterol: 192  (01/15/2008)   Lipid panel action/deferral: Deferred   LDL: 130  (01/15/2008)   LDL Direct: 137  (09/27/2009)   HDL: 40  (01/15/2008)   Triglycerides: 110  (01/15/2008)    SGOT (AST): 15  (09/27/2009)   BMP action: Ordered   SGPT (ALT): 10  (09/27/2009) CMP ordered    Alkaline phosphatase: 70  (09/27/2009)   Total bilirubin: 0.6  (09/27/2009)    Lipid flowsheet reviewed?: Yes   Progress toward LDL goal: Unchanged  Self-Management Support :   Personal Goals (by the next clinic visit) :      Personal LDL goal: 130  (09/27/2009)    Lipid self-management support: Not documented     Lipid self-management support not done  because:  Not indicated  (04/18/2010)

## 2010-10-04 NOTE — Progress Notes (Signed)
Summary: Pt returned call from triage  Phone Note Call from Patient Call back at [phone]   Caller: Patient Call For: young Reason for Call: Talk to Nurse Summary of Call: Patient was given sputum culture cups- started trying to cough up deep sputum since Monday morning and has not been able to.   She was told to call if she was unable do this. Initial call taken by: Lehman Prom,  August 02, 2010 11:41 AM  Follow-up for Phone Call        Unable to cough up any sputum for culture. Please advise if patient should continue trying for another week or so.Michel Bickers [facility]  August 02, 2010 12:32 PM  Additional Follow-up for Phone Call Additional follow up Details #1::        PT RETURNED CALL. Tivis Ringer, CNA  August 06, 2010 8:39 AM
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
## 2010-10-04 NOTE — Assessment & Plan Note (Signed)
Summary: 6 months/ mbw   Primary Provider/Referring Provider:  Barnabas Lister  MD  CC:  6 month follow up visit-chest congestion; cough-productive-yellow green in color..  History of Present Illness: July 31, 2009- Asthmatic bronchitis/ COPD, hx lung Cancer/RUL Thinks she is doing well with no changes. Just had a f/u CT with result pending.  Denies change in breathing. Coughs esp in AMs productively- morning cough. Uses Asmanaex and Foradil every day and Xopenex most days. She has inherited a nebulizer machine and we discussed indications for use. Had flu vax.  Jan 26, 2010- Asthmatic bronchitis/ COPD, hx lung CA/ RUL/ 2003 Complains of pollen rhinitis- sneeze,drip- yellow.. Not yet needing antihistamine. Has been outside alot in sun- discused skin protection. Wheeze 2x/week. Dyspnea with exertion stairs, low inclines, long parking lot- remains stable. No formal exercise. Walks some with daughter 67 yo. Can't tolerate albuterol rescue inhalers- tremor and overstimulation. Asks we continue Xopenex HFA. SABA 2x/ week. Denies nodes, lumps, blood, chest pain, palpitation. Due for f/u CXR w/ Dr Edwyna Shell for cancer f/u.  July 27, 2010- Asthmatic bronchitis/ COPD, hx lung CA/ RUL/ 2003 Nurse-CC: 6 month follow up visit-chest congestion; cough-productive-yellow green in color. Dr Edwyna Shell had reported recent abnl CXR and she comes for f/u. She has felt more congested for about 3 weeks with some mucus/ plugs. Some night sweats, sinus drainage,.No fever or chills, blood or nodes. Trying to lose some weight= few lbs down. No chemo since about 2003. Hx RULobect and XRT originally. No recurrence. No hx TBX. PPDs were neg. She has not resumed smoking, but boyfriend smokes heavily and heats with wood stove. Had flu vax. Had pneumovax 2-3 years ago. CT - 07/17/10-I reviewed images. New diffuse cavitary nodular process w/ peribronchial thickening. Old radiation changes including a right paraspinal  consolidation, are unchanged.  CXR- Similar to CT as described. PFT 02/14/10- Normal measured lung volumes, which seems unlikely unless couterbalalnced restriction and hyperinflation. DLCO moderately reduced.    Preventive Screening-Counseling & Management  Alcohol-Tobacco     Alcohol drinks/day: <1     Alcohol type: beer     Smoking Status: quit > 6 months     Packs/Day: 3.0     Year Started: age 72     Year Quit: 2003     Passive Smoke Exposure: yes     Tobacco Counseling: to remain off tobacco products     Passive Smoke Counseling: to avoid passive smoke exposure  Current Medications (verified): 1)  Ambien 10 Mg Tabs (Zolpidem Tartrate) .Marland Kitchen.. 1 Tablet By Mouth Every Night 2)  Asmanex 60 Metered Doses 220 Mcg/inh  Aepb (Mometasone Furoate) .Marland Kitchen.. 1 Puff and Rinse Twice Daily 3)  Foradil Aerolizer 12 Mcg  Caps (Formoterol Fumarate) .... Use Twice Daily 4)  Xopenex Hfa 45 Mcg/act  Aero (Levalbuterol Tartrate) .... As Needed 5)  Topamax 50 Mg Tabs (Topiramate) .... Take 1 and 1/2 Tablets By Mouth Bid 6)  Alprazolam 0.5 Mg  Tabs (Alprazolam) .... One and One Half Tabs By Mouth Three Times A Day As Needed Anxiety 7)  Aleve 220 Mg Tabs (Naproxen Sodium) .... As Needed 8)  Xopenex 0.63 Mg/59ml Nebu (Levalbuterol Hcl) .Marland Kitchen.. 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 airway obstruc, mild restriction - 01/31/2005,  TVH - 12/01/2000, portacath removed 10/2007 right neck mass bx'd  negative.  Family History: Last updated: 02/09/08 Dad - lung ca (deceased) Daughter - recurrent UTIs [employer] - DM, Mom - healthy mother alive age 72  Social History: Last updated: 09/27/2009 Lives w/ her 41 yo daughter Constance Haw- also pt here).  Husband died in 01-17-2001 of MVA. Unemployed. Quit smoking: 2 packs a day for 23 years (quit in Jan 17, 2002  when diagnosed with lung ca).  Hx of EtOH abuse 20' to 30'.   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 Young and Burney Interstitial lung disease 01/17/2010  Social History: Packs/Day:  3.0 Passive Smoke Exposure:  yes  Review of Systems      See HPI       The patient complains of shortness of breath with activity, productive cough, and weight change.  The patient denies shortness of breath at rest, non-productive cough, coughing up blood, chest pain, irregular heartbeats, acid heartburn, indigestion, loss of appetite, abdominal pain, difficulty swallowing, sore throat, tooth/dental problems, headaches, nasal congestion/difficulty breathing through nose, sneezing, itching, ear ache, rash, change in color of mucus, and fever.    Vital Signs:  Patient profile:   45 year old female Height:      65 inches Weight:      129.13 pounds BMI:     21.57 O2 Sat:      97 % on Room air Pulse rate:   111 / minute BP sitting:   106 / 76  (right arm) Cuff size:   regular  Vitals Entered By: Reynaldo Minium CMA (July 27, 2010 9:08 AM)  O2 Flow:  Room air CC: 6 month follow up visit-chest congestion; cough-productive-yellow green in color.   Physical Exam  Additional Exam:  General: A/Ox3; pleasant and  cooperative, NAD, talkative SKIN: no rash, lesions, chronic flushed appearance. NODES: no lymphadenopathy HEENT: Turner/AT, EOM- WNL, Conjuctivae- clear, PERRLA, TM-WNL, Nose- clears, Throat- clear , Mallampati II NECK: Supple w/ fair ROM, JVD- none, normal carotid impulses w/o bruits Thyroid-  CHEST: Coarse sounds, no cough somewhat diminished,  HEART: RRR, no m/g/r heard ABDOMEN- not obese. ZOX:WRUE, nl pulses, no edema  NEURO: Grossly intact to observation,      Impression & Recommendations:  Problem # 1:  BRONCHITIS, CHRONIC (ICD-491.9)  There has been a definiite change in her CXR/ CT. 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 Her boy friend smokes heavily and burns wood, which could add bronchitis. DDX includes drug reaction, but she has been off chemo a long time, and possible lymphangitic CA. We will place PPD and give her sputum cups to try for cultures. If these are nonspecific we have discussed bronchoscopy as next step. We could do this w/ lavage and transbronchial bx if needed.  Problem # 2:  Hx of LUNG CANCER (ICD-162.9) She is still followed by Dr Darnelle Catalan and Dr Edwyna Shell and they will be copied, but I doubt this is recurrent lung cancer.   Other Orders: Est. Patient Level IV (45409)  T-Culture, Sputum & Gram Stain (87070/87205-70030) T-Culture & Smear AFB (87206/87116-70280) T-Culture & Smear Fungus (87206/87102-70320) TB Skin Test (62130) Admin 1st Vaccine (86578)  Patient Instructions: 1)  Please schedule a follow-up appointment in 3 weeks. 2)  Lab for sputum culture cups- start trying to cough up deep sputum Monday morning so you can drop it off fresh.  3)  PPD skin test 4)  cc Dr Darnelle Catalan, Dr Edwyna Shell Prescriptions: Pauline Aus 0.63 MG/3ML NEBU (LEVALBUTEROL HCL) 1 neb four times a day as needed  #25 x prn   Entered and Authorized by:   Waymon Budge MD   Signed by:   Waymon Budge MD on 07/27/2010   Method used:   Print then Give to Patient   RxID:    [id number] NUUVOZD HFA 45 MCG/ACT  AERO (LEVALBUTEROL TARTRATE) as needed  #1 x 11   Entered and Authorized by:   Waymon Budge MD   Signed by:   Waymon Budge MD on 07/27/2010   Method used:   Print then Give to Patient   RxID:   [id number] FORADIL AEROLIZER 12 MCG  CAPS (FORMOTEROL FUMARATE) use twice daily  #60 x 11   Entered and Authorized by:   Waymon Budge MD   Signed by:   Waymon Budge MD on 07/27/2010   Method used:   Print then Give to Patient   RxID:   [id number] ASMANEX 60 METERED DOSES 220 MCG/INH  AEPB (MOMETASONE FUROATE) 1 puff and rinse twice daily  #1 x 11   Entered and Authorized by:   Waymon Budge MD   Signed by:   Waymon Budge MD on 07/27/2010   Method used:   Print then Give to Patient   RxID:   [id number]    Immunizations Administered:  PPD Skin Test:    Vaccine Type: PPD    Site: right forearm    Mfr: Sanofi Pasteur    Dose: 0.1 ml    Route: ID    Given by: Reynaldo Minium CMA    Exp. Date: 12/27/2011    Lot #: [id number]

## 2010-10-04 NOTE — Miscellaneous (Signed)
Summary: [facility] Cont[facility] Sub[facility] Contract  [facility] Cont[facility]stance Contract   Imported By: Clydell Hakim 09/28/2009 16:02:59  _____________________________________________________________________  External Attachment:    Type:   Image     Comment:   External Document

## 2010-10-04 NOTE — Letter (Signed)
Summary: Generic Letter  [facility]  [address]   [place], [place] [address]   Phone: [phone]  Fax: [phone]    12/26/2009  [facility] [place] [id number] [place], [place]  [address]  Dear [name],  It has been my pleasure to have been your physician over the last 3 years. I truly have enjoyed getting to know you and helping you with your healthcare needs. Over the next several weeks, you will be assigned a new provider at [facility]. He/She will have access to all of your records and continue to provide you with excellent care. If you have any questions, please feel free to contact our office.  Sincerely,   Lori Asal  MD  Appended Document: Generic Letter mailed

## 2010-10-10 NOTE — Letter (Signed)
Summary: Ines Bloomer MD/[facility]  Ines Bloomer MD/[facility]   Imported By: Lester Ferris 10/04/2010 09:35:33  _____________________________________________________________________  External Attachment:    Type:   Image     Comment:   External Document

## 2010-10-26 ENCOUNTER — Encounter: Payer: Self-pay | Admitting: Family Medicine

## 2010-10-26 ENCOUNTER — Ambulatory Visit (INDEPENDENT_AMBULATORY_CARE_PROVIDER_SITE_OTHER): Payer: PRIVATE HEALTH INSURANCE | Admitting: Family Medicine

## 2010-10-26 VITALS — BP 110/77 | HR 100 | Ht 65.0 in | Wt 125.0 lb

## 2010-10-26 DIAGNOSIS — E7800 Pure hypercholesterolemia, unspecified: Secondary | ICD-10-CM

## 2010-10-26 DIAGNOSIS — F411 Generalized anxiety disorder: Secondary | ICD-10-CM

## 2010-10-26 LAB — CONVERTED CEMR LAB
HDL: 44 mg/dL (ref >39)
LDL Cholesterol: 113 mg/dL — ABNORMAL HIGH (ref 0–99)
Total CHOL/HDL Ratio: 3.9

## 2010-10-26 LAB — LIPID PANEL
HDL: 44 mg/dL (ref >39)
LDL Cholesterol: 113 mg/dL — ABNORMAL HIGH (ref 0–99)
Triglycerides: 82 mg/dL (ref <150)

## 2010-10-26 MED ORDER — ALPRAZOLAM 0.5 MG PO TABS: 0.5000 mg | ORAL_TABLET | ORAL | Status: DC

## 2010-10-26 MED ORDER — TOPIRAMATE 50 MG PO TABS: ORAL_TABLET | ORAL | Status: DC

## 2010-10-26 NOTE — Progress Notes (Signed)
  Subjective:    Patient ID: [name], female    DOB: 08/14/1966, 45 y.o.   MRN: [id number]  HPI This is a 45 year old female who is here for an annual physical.  She has been doing well overall since I last saw her in August.  She denies any recent illnesses, hospitalizations, ED visits.  She is followed closely by both a pulmonologist and oncologist for her lung cancer, in remission.  States that she recently had a bronchoscopy that showed a benign mass.  Pulmonology is following.  Denies any migraines, controlled on Topomax.  Says anxiety disorder is improving, but she still takes Xanax 3 times a day.      Review of Systems Endorses chronic cough.  Denies fever, chest pain, chills, N/V.  Denies vision changes, HA, numbness/tingling in extremities, abdominal pain, constipation/diarrhea, dysuria.    Objective:   Physical Exam  Constitutional: She appears cachectic. She is cooperative.  HENT:  Head: Normocephalic and atraumatic.  Mouth/Throat: Oropharynx is clear and moist and mucous membranes are normal. Abnormal dentition.  Cardiovascular: Normal rate, regular rhythm, S1 normal, S2 normal and intact distal pulses.  Exam reveals distant heart sounds.   Pulmonary/Chest: Effort normal and breath sounds normal. She has no wheezes. She has no rhonchi. She has no rales.  Abdominal: Soft. Normal appearance and bowel sounds are normal. There is no tenderness. There is no rebound.  Musculoskeletal:       Right shoulder: She exhibits decreased range of motion and bony tenderness. She exhibits no swelling and no crepitus.  Neurological: She is alert. No cranial nerve deficit.  Skin: Skin is warm and intact.  Psychiatric: Her mood appears anxious.          Assessment & Plan:

## 2010-10-26 NOTE — Patient Instructions (Addendum)
It was great to see you today.   I will send you a letter with results of cholesterol panel. Please take your medications as directed. If you continue to have shoulder pain, please return to clinic for further evaluation. Otherwise, I will see you again in 6 months. Thank you!

## 2010-10-28 NOTE — Assessment & Plan Note (Signed)
Patient says she has failed SSRIs and Xanax is the only medication that works.  She has seen Dr. Pascal Lux for this anxiety DO.  Will investigate this further.  May consider UDS and substance abuse contract at next visit.  Will discuss my concerns for patient's current therapy at next visit.  Will continue current regimen for now.

## 2010-10-28 NOTE — Assessment & Plan Note (Signed)
Patient is not taking any medications for hyperlipidemia.  Will get FLP today and send results to patient.

## 2010-10-30 ENCOUNTER — Other Ambulatory Visit: Payer: Self-pay | Admitting: Oncology

## 2010-10-30 ENCOUNTER — Encounter (HOSPITAL_BASED_OUTPATIENT_CLINIC_OR_DEPARTMENT_OTHER): Payer: Medicare Other | Admitting: Oncology

## 2010-10-30 DIAGNOSIS — C3490 Malignant neoplasm of unspecified part of unspecified bronchus or lung: Secondary | ICD-10-CM

## 2010-10-30 DIAGNOSIS — Z85118 Personal history of other malignant neoplasm of bronchus and lung: Secondary | ICD-10-CM

## 2010-10-30 LAB — COMPREHENSIVE METABOLIC PANEL
ALT: 11 U/L (ref 0–35)
Albumin: 3.8 g/dL (ref 3.5–5.2)
Alkaline Phosphatase: 78 U/L (ref 39–117)
Potassium: 3.5 mEq/L (ref 3.5–5.3)
Sodium: 138 mEq/L (ref 135–145)
Total Bilirubin: 0.6 mg/dL (ref 0.3–1.2)
Total Protein: 7.2 g/dL (ref 6.0–8.3)

## 2010-10-30 LAB — CBC WITH DIFFERENTIAL/PLATELET
Basophils Absolute: 0 10*3/uL (ref 0.0–0.1)
EOS%: 0.9 % (ref 0.0–7.0)
HCT: 40 % (ref 34.8–46.6)
HGB: 14.1 g/dL (ref 11.6–15.9)
LYMPH%: 10.3 % — ABNORMAL LOW (ref 14.0–49.7)
MCH: 32.1 pg (ref 25.1–34.0)
MCHC: 35.1 g/dL (ref 31.5–36.0)
MONO#: 0.5 10*3/uL (ref 0.1–0.9)
NEUT%: 83 % — ABNORMAL HIGH (ref 38.4–76.8)
Platelets: 232 10*3/uL (ref 145–400)
lymph#: 0.9 10*3/uL (ref 0.9–3.3)

## 2010-11-09 ENCOUNTER — Ambulatory Visit (HOSPITAL_COMMUNITY)
Admission: RE | Admit: 2010-11-09 | Discharge: 2010-11-09 | Disposition: A | Discharge status: Home / Self Care | Payer: PRIVATE HEALTH INSURANCE | Source: Ambulatory Visit | Attending: Oncology | Admitting: Oncology

## 2010-11-09 DIAGNOSIS — J438 Other emphysema: Secondary | ICD-10-CM | POA: Insufficient documentation

## 2010-11-09 DIAGNOSIS — C3490 Malignant neoplasm of unspecified part of unspecified bronchus or lung: Secondary | ICD-10-CM | POA: Insufficient documentation

## 2010-11-09 MED ORDER — IOHEXOL 300 MG/ML  SOLN
80.0000 mL | Freq: Once | INTRAMUSCULAR | Status: AC | PRN
  Administered 2010-11-09: 80 mL via INTRAVENOUS

## 2010-11-12 LAB — FUNGUS CULTURE W SMEAR: Fungal Smear: NONE SEEN

## 2010-11-12 LAB — CULTURE, RESPIRATORY W GRAM STAIN
Culture: NORMAL
Gram Stain: NONE SEEN

## 2010-11-12 LAB — AFB CULTURE WITH SMEAR (NOT AT ARMC): Acid Fast Smear: NONE SEEN

## 2010-11-13 ENCOUNTER — Encounter: Payer: Self-pay | Admitting: Family Medicine

## 2010-11-20 ENCOUNTER — Ambulatory Visit: Payer: Self-pay | Admitting: Internal Medicine

## 2010-12-03 ENCOUNTER — Other Ambulatory Visit: Payer: Self-pay | Admitting: Thoracic Surgery

## 2010-12-03 DIAGNOSIS — C3410 Malignant neoplasm of upper lobe, unspecified bronchus or lung: Secondary | ICD-10-CM

## 2010-12-04 ENCOUNTER — Ambulatory Visit: Payer: PRIVATE HEALTH INSURANCE | Admitting: Thoracic Surgery

## 2010-12-04 ENCOUNTER — Ambulatory Visit: Payer: Self-pay | Admitting: Thoracic Surgery

## 2010-12-10 ENCOUNTER — Encounter: Payer: Self-pay | Admitting: Internal Medicine

## 2010-12-10 ENCOUNTER — Other Ambulatory Visit: Payer: Self-pay | Admitting: Thoracic Surgery

## 2010-12-10 DIAGNOSIS — C3410 Malignant neoplasm of upper lobe, unspecified bronchus or lung: Secondary | ICD-10-CM

## 2010-12-11 ENCOUNTER — Ambulatory Visit (INDEPENDENT_AMBULATORY_CARE_PROVIDER_SITE_OTHER): Payer: Medicare Other | Admitting: Thoracic Surgery

## 2010-12-11 ENCOUNTER — Ambulatory Visit (INDEPENDENT_AMBULATORY_CARE_PROVIDER_SITE_OTHER): Payer: Medicare Other | Admitting: Internal Medicine

## 2010-12-11 ENCOUNTER — Encounter: Payer: Self-pay | Admitting: Internal Medicine

## 2010-12-11 VITALS — BP 112/68 | HR 107 | Ht 65.0 in | Wt 126.6 lb

## 2010-12-11 DIAGNOSIS — R0989 Other specified symptoms and signs involving the circulatory and respiratory systems: Secondary | ICD-10-CM

## 2010-12-11 DIAGNOSIS — R0609 Other forms of dyspnea: Secondary | ICD-10-CM

## 2010-12-11 DIAGNOSIS — J42 Unspecified chronic bronchitis: Secondary | ICD-10-CM

## 2010-12-11 DIAGNOSIS — R0683 Snoring: Secondary | ICD-10-CM | POA: Insufficient documentation

## 2010-12-11 DIAGNOSIS — C3490 Malignant neoplasm of unspecified part of unspecified bronchus or lung: Secondary | ICD-10-CM

## 2010-12-11 DIAGNOSIS — J84111 Idiopathic interstitial pneumonia, not otherwise specified: Secondary | ICD-10-CM

## 2010-12-11 HISTORY — DX: Snoring: R06.83

## 2010-12-11 NOTE — Progress Notes (Signed)
  Subjective:    Patient ID: [name], female    DOB: [birth year], 45 y.o.   MRN: [id number]  HPI 45 yo former heavy smoker followed after RUL lobectomy for lung cancer, chronic bronchitis, allergic rhinitis, recent diffuse interstitial process. Dr Darnelle Catalan recently saw her for Oncology f/u and repeated CT 11/09/10 - images reviewed. There has been a decrease in the numerous cavitating nodular infiltrates since November comparison. This improved with no treatment. She has seen Dr Edwyna Shell today.  She has been sunning herself, lying outside and blames seasonal pollen for what she considers normal for season mild chest congestion with scant light yellow phlegm. Denies chest pain, fever, night sweats, nodes. Recent PPD skin test was negative. HIV serology was negative. She had chosen to defer bronchoscopy discussed at last visit, because she felt well despite CXR.   Review of Systems See HPI Constitutional:   No weight loss, night sweats,  Fevers, chills, fatigue, lassitude. HEENT:   No headaches,  Difficulty swallowing,  Tooth/dental problems,  Sore throat,                No sneezing, itching, ear ache, nasal congestion, post nasal drip,         Snore is waking her up.   CV:  No chest pain,  Orthopnea, PND, swelling in lower extremities, anasarca, dizziness, palpitations  GI  No heartburn, indigestion, abdominal pain, nausea, vomiting, diarrhea, change in bowel habits, loss of appetite  Resp: No shortness of breath with exertion or at rest.  No excess mucus, minimal productive cough,  No non-productive cough,  No coughing up of blood.  No change in color of mucus.  No wheezing.  No chest wall deformity  Skin: no rash or lesions.--often sunburned  GU: no dysuria, change in color of urine, no urgency or frequency.  No flank pain.  MS:  No joint pain or swelling.  No decreased range of motion.  No back pain.  Psych:  No change in mood or affect. No depression or anxiety.  No memory  loss.      Objective:   Physical Exam General- Alert, Oriented, Affect-appropriate, Distress- none acute  Skin- suntanned/ burned  Lymphadenopathy- small rubbery anterior cervical nodes  Head- atraumatic  Eyes- Gross vision intact, PERRLA, conjunctivae clear, secretions  Ears- Hearing- normal-canals, Tm L ,   R ,  Nose- Clear,  NoSeptal dev, mucus, polyps, erosion, perforation   Throat- Mallampati II , mucosa clear , drainage- none, tonsils- atrophic  Neck- flexible , trachea midline, no stridor , thyroid nl, carotid no bruit  Chest - symmetrical excursion , unlabored     Heart/CV- RRR , no murmur , no gallop  , no rub, nl s1 s2                     - JVD- none , edema- none, stasis changes- none, varices- none     Lung- clear to P&A, wheeze- none, cough- none , dullness-none, rub- none. Coarse breath sounds right upper zone.    Chest wall- Abd- tender-no, distended-no, bowel sounds-present, HSM- no  Br/ Gen/ Rectal- Not done, not indicated  Extrem- cyanosis- none, clubbing, none, atrophy- none, strength- nl  Neuro- grossly intact to observation         Assessment & Plan:

## 2010-12-11 NOTE — Assessment & Plan Note (Signed)
We discussed markers for sleep apnea at her request. She will try nasal strips and chin strap and ask her boy friend to pay more attention.

## 2010-12-11 NOTE — Patient Instructions (Signed)
Orders- Sputum culture and sensitivity for routine and AFB  For problem snoring- Try a chin strap with Nasal strips

## 2010-12-11 NOTE — Assessment & Plan Note (Signed)
She has been remarkably unaware of symptoms from this process, which is now improving. An atypical AFB or a noninfectious process might look like this. She will try to get a sputum specimen.

## 2010-12-15 ENCOUNTER — Encounter: Payer: Self-pay | Admitting: Internal Medicine

## 2010-12-15 NOTE — Assessment & Plan Note (Signed)
No evident recurrence. 

## 2010-12-15 NOTE — Assessment & Plan Note (Signed)
Former heavy smoker. Boyfriend now smokes and heats with wood.

## 2011-01-10 ENCOUNTER — Other Ambulatory Visit: Payer: Medicare Other

## 2011-01-10 DIAGNOSIS — J84111 Idiopathic interstitial pneumonia, not otherwise specified: Secondary | ICD-10-CM

## 2011-01-15 NOTE — Letter (Signed)
September 04, 2010   Clinton D. Maple Hudson, MD, FCCP, FACP  [facility]  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  Her cultures are still pending.  Her chest x-ray is  stable or maybe slightly improved.  Her blood pressure was 100/78, pulse  100, respirations 18, sats were 96%.  I will plan to see her back again  in 3 months.  I think we will discuss where we go from here with her at  the cancer conference.   Ines Bloomer, M.D.  Electronically Signed   DPB/MEDQ  D:  09/04/2010  T:  09/05/2010  Job:  [id number]   cc:   Valentino Hue. Magrinat, M.D.

## 2011-01-15 NOTE — Letter (Signed)
May 20, 2007   Ruthann Cancer, MD.  [address] [facility]  [place], [place]  [address]   Re:  ELIM, PEALE             DOB:  Apr 15, 1966   Dear Gus:   I saw your patient back today. Repeat CT scan shows no evidence of  recurrence of her cancer which is really good news. We will see her  again in 6 months and repeat her CT scan. She is now coming up on 5  years since we treated her for stage 2B non-small-cell lung cancer.   Her blood pressure was 124/84, pulse 86, respirations 18, sats are 98%.   Ines Bloomer, M.D.  Electronically Signed   DPB/MEDQ  D:  05/20/2007  T:  05/21/2007  Job:  [id number]

## 2011-01-15 NOTE — Assessment & Plan Note (Signed)
OFFICE VISIT   [name], [name]  DOB:  Jan 05, 1966                                        August 10, 2008  CHART #:  [id number]   The patient came in the office today and her CT scan in November was  negative.  Her blood pressure was 115/74, pulse 86, respirations 18, and  sats were 100%.  She has had pulmonary function test, which showed a  chronic obstructive pulmonary defect, but overall she is doing well.  I  will see her in 6 months with a chest x-ray and we will repeat her CT  scan in 1 year.   Ines Bloomer, M.D.  Electronically Signed   DPB/MEDQ  D:  08/10/2008  T:  08/10/2008  Job:  [id number]

## 2011-01-15 NOTE — Procedures (Signed)
[name], [name]             ACCOUNT NO.:  [id number]   MEDICAL RECORD NO.:  [id number]          PATIENT TYPE:  OUT   LOCATION:  SLEEP CENTER                 FACILITY:  [facility]   PHYSICIAN:  Clinton D. Maple Hudson, MD, FCCP, FACPDATE OF BIRTH:  01-Oct-1963   DATE OF STUDY:  02/11/2007                            NOCTURNAL POLYSOMNOGRAM   REFERRING PHYSICIAN:   REFERRING PHYSICIAN:  Clinton D. Young, MD.   INDICATION FOR STUDY:  Insomnia with sleep apnea.   EPWORTH SLEEPINESS SCORE:  6/24, BMI 30, weight 185 pounds.   HOME MEDICATIONS:  Listed and reviewed.  A previous study on November 08, 2003, had recorded an AHI of 0 per hour.  She has significant complaints  of fatigue and snoring raising the question again of sleep apnea.   SLEEP ARCHITECTURE:  Total sleep time 335 minutes with sleep efficiency  90%.  Stage 1 was 5%, stage 2 82%, stages 3 and 4 absent, REM 13% of  total sleep time.  Sleep latency 8 minutes, REM latency 70 minutes,  awake after sleep onset 28 minutes, arousal index 8.4.  Ambien was taken  at 10 p.m.   RESPIRATORY DATA:  Apnea/hypopnea index (AHI, RDI) 2.3 obstructive  events per hour, which is within normal limits (normal range 0 to 5 per  hour).  There was 1 central apnea, 2 obstructive apneas, and 10  hypopneas.  Events were not positional.  REM AHI 16.4.  There were  insufficient events to permit CPAP titration by split protocol.  EEG  arousals were not specifically related to respiratory events.   OXYGEN DATA:  Moderate snoring with oxygen desaturation transiently to a  nadir of 85%.  Mean oxygen saturation through the study was 93% on room  air.   CARDIAC DATA:  Sinus rhythm with occasional sinus pause.   MOVEMENT/PARASOMNIA:  Occasional limb jerk, insignificant.   IMPRESSION/RECOMMENDATION:  1. Unremarkable sleep architecture with sleep onset at 11:26 p.m.,      little actual waking after sleep onset.  2. Occasional sleep disordered breathing event  within normal limits,      apnea/hypopnea index 2.3 per hour (normal range is 0 to 5 per      hour).      Clinton D. Maple Hudson, MD, [facility], FACP  Diplomate, Biomedical engineer of Sleep Medicine  Electronically Signed     CDY/MEDQ  D:  02/15/2007 16:20:39  T:  02/15/2007 23:16:01  Job:  [id number]

## 2011-01-15 NOTE — Assessment & Plan Note (Signed)
[facility]                             PULMONARY OFFICE NOTE   [name], [name]                    MRN:          [id number]  DATE:01/15/2007                            DOB:          [birth year]    PROBLEMS:  1. Chronic insomnia.  2. Asthma with chronic obstructive pulmonary disease.  3. Right upper lobe wedge resection, 2003/adenocarcinoma with positive      nodes.  4. Hemoptysis.  5. History of seizure.  6. Allergic rhinitis.   HISTORY:  Snoring and raspy nocturnal breathing drive her daughter from  bedroom when she visits, otherwise sleeps alone with no witnesses. She  admits to tiredness, but says daytime nervousness has been much better  since she has been on Clonazepam 1/2 tablet b.i.d. She never feels  completely clear in her chest, always a little shortness of breath with  exertion. She likes the combination of Foradil b.i.d. with p.r.n.  Xopenex inhaler and had not done well with inhaled steroids previously.  Occasional phlegm, no chest pain, or sudden events. She blames Iressa  for flushing, but admits that she spends a lot of time sunning as well.   MEDICATIONS:  1. Foradil b.i.d.  2. Nasacort p.r.n.  3. Iressa.  4. Protonix.  5. Clonazepam.  6. Imitrex.  7. Xopenex HFA.  8. Ambien 10 mg occasional.   ALLERGIES:  No medication allergy.   OBJECTIVE:  Weight 185 pounds, blood pressure 110/78, pulse regular 89,  room air saturation 95%. She is pretty heavily tanned for her skin type  with facial flushing, dry cough with deep breath, no rhonchi or  dullness, no adenopathy. Heart sounds normal.   PFT:  Mild to moderate obstruction with insignificant response to  bronchodilator, minimal restriction, diffusion moderately reduced. FEV1  was 1.56 (58% predicted) with FEV1/FVC ratio 0.69 reflecting reduced  FVC. Her total lung capacity was 80% of predicted which is borderline.  Diffusion was 53%. On a 6 minute walk test, she  went 477 meters. She had  to stop briefly twice to cough and she complained of some diffuse vague  chest discomfort. She maintained oxygenation normal at 95-97%  throughout. Heart rate rose from 89-123 and 2 minutes later had dropped  to 95. Blood pressure rose from 110/78 to 116/80 and 2 minutes later had  recovered to 106/80. I discussed the distinction between dyspnea due to  pulmonary limitation and deconditioning.   IMPRESSION:  Chronic obstructive pulmonary disease, snoring with concern  of obstructive sleep apnea, which I think is pertinent given her health  problems. Incidental concern that she continues to overexpose to the sun  and needs counseling on this.   PLAN:  We are scheduling a split protocol nocturnal polysomnogram at the  [facility] and she will return after that for follow up.     Clinton D. Maple Hudson, MD, FCCP, FACP     CDY/MedQ  DD: 01/15/2007  DT: 01/16/2007  Job #: [id number]   cc:   Valentino Hue. Magrinat, M.D.  [facility]  [facility]

## 2011-01-15 NOTE — Op Note (Signed)
NAMEJEANET, LUPE             ACCOUNT NO.:  [id number]   MEDICAL RECORD NO.:  [id number]          PATIENT TYPE:  AMB   LOCATION:  SDS                          FACILITY:  [facility]   PHYSICIAN:  Ines Bloomer, M.D. DATE OF BIRTH:  1966-07-28   DATE OF PROCEDURE:  10/21/2007  DATE OF DISCHARGE:                               OPERATIVE REPORT   PREOPERATIVE DIAGNOSIS:  Status post chemotherapy for non-small cell  lung cancer.   POSTOPERATIVE DIAGNOSIS:  Status post chemotherapy for non-small cell  lung cancer.   OPERATION PERFORMED:  Removal of left subclavian Port-A-Cath.   SURGEON:  Ines Bloomer, M.D.   ANESTHESIA:  1% Xylocaine.   After prep and drape of the left chest, the area was infiltrated with 1%  Xylocaine.  A longitudinal incision was made and dissection was carried  down through the subcutaneous tissues.  The capsule around the Port-A-  Cath was opened with a knife and the stitch was cut and then the Port-A-  Cath was dissected out, first removing the tubing and then removing the  reservoir.  The wound was closed with 3-0 Vicryl in the subcutaneous  tissue and 3-0 Vicryls and Dermabond.  The patient tolerated the  procedure well and returned to the recovery room in stable condition.      Ines Bloomer, M.D.  Electronically Signed     DPB/MEDQ  D:  10/21/2007  T:  10/21/2007  Job:  [id number]   cc:   Valentino Hue. Magrinat, M.D.

## 2011-01-15 NOTE — Assessment & Plan Note (Signed)
OFFICE VISIT   [name], [name]  DOB:  03-Apr-1966                                        January 31, 2010  CHART #:  [id number]   The patient came for follow up today.  She is doing well overall.  Chest  x-ray is stable.  She has no more medical problems.  Her blood pressure  was 117/77, pulse 95, respirations 18, sats were 95%.  I will see her  back in November when she gets the CT scan by Dr. Darnelle Catalan.   Ines Bloomer, M.D.  Electronically Signed   DPB/MEDQ  D:  01/31/2010  T:  02/01/2010  Job:  [id number]

## 2011-01-15 NOTE — Assessment & Plan Note (Signed)
OFFICE VISIT   OTTILIE, [name]  DOB:  [birth year]                                        February 08, 2009  CHART #:  [id number]   The patient came today.  Her chest x-ray is stable.  She is now over 6-  1/2 years since her lung cancer.  She has another CT scan scheduled in  November by Dr. Darnelle Catalan and we will see her back again at that time.  Her blood pressure was 109/78, pulse 79, respirations 18, and  saturations were 99%.  Her only complaint was numbness in her fingers  and maybe related to some medication that she is on.   Ines Bloomer, M.D.  Electronically Signed   DPB/MEDQ  D:  02/08/2009  T:  02/09/2009  Job:  [id number]

## 2011-01-15 NOTE — Assessment & Plan Note (Signed)
[facility]                             PULMONARY OFFICE NOTE   [name], [name]                    MRN:          [id number]  DATE:06/25/2007                            DOB:          Nov 05, 1965    PROBLEM:  1. Chronic insomnia.  2. Chronic obstructive pulmonary disease.  3. 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  4. Hemoptysis.  5. History of seizure, question Advair.  6. Allergic rhinitis.   HISTORY:  Just does not feel well.  Thinks cough is a little more  productive.  Blames Iressa for her fatigue.  Sputum a little more  productive and yellow.  Cold air bothering her.  Having some epistaxis  left nostril.  She had seen Dr. Jenne Pane, who encouraged nasal saline  lavage.  She has also had a thyroid nodule biopsied.  She had her 1st  pneumococcal vaccine in 2007, but wants flu shot.   MEDICATIONS:  1. Foradil 1 b.i.d. She has not been using a cortisone inhaler, and I      discussed long acting bronchodilator medications and steroid      therapy.  2. Nasacort.  3. Iressa.  4. Protonix 40 mg.  5. Clonazepam 0.5 mg x1/2 b.i.d.  6. Vesicare.  7. Ambien 10 mg nightly p.r.n.  8. Xopenex HFA rescue inhaler p.r.n.  We have discussed sleep hygiene      again and airway protection during cold months.   OBJECTIVE:  Weight 184 pounds.  BP 118/68.  Pulse 93.  Room air  saturation 95%.  She always looks flushed.  Dry cough.  A few scattered rhonchi.  Unlabored breathing.  There is mucus bridging in the left nostril, very tiny clot.  Pharynx  clear.  HEART:  Sounds regular without murmur.   IMPRESSION:  1. Chronic obstructive pulmonary disease with increased bronchitis.  2. Epistaxis, probably reflects combination of dry air and steroid      nasal spray.  3. Chronic insomnia with options again reviewed.   PLAN:  1. Continue nasal saline.  Stop nasal steroid spray for a week or so      until the epistaxis  stops.  2. Flu vaccine.  3. Add steroid inhaler, Asmanex 1 puff b.i.d. with sample and      discussion.  4. We refilled her Ambien and Protonix.  5. Schedule return in 4 months, earlier p.r.n.     Clinton D. Maple Hudson, MD, Tonny Bollman, FACP  Electronically Signed    CDY/MedQ  DD: 06/25/2007  DT: 06/26/2007  Job #: [id number]   cc:   [facility]  Antony Contras, MD  Valentino Hue. Magrinat, M.D.

## 2011-01-15 NOTE — Assessment & Plan Note (Signed)
[facility]                             PULMONARY OFFICE NOTE   [name], [name]                    MRN:          [id number]  DATE:02/25/2007                            DOB:          08/03/1966    PROBLEM:  1. Chronic insomnia.  2. Chronic obstructive pulmonary disease.  3. 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  4. Hemoptysis.  5. History of seizure.  6. Allergic rhinitis.   HISTORY:  She had a sleep study done because of complaints of fatigue  and snoring, February 11, 2007.  This showed an index of only 2.3 per hour,  which was within normal limits with moderate snoring, and transient  desaturation to an nadir of 85%.  She did like her brief exposure to  CPAP during fitting because it opened her nose.  She is concerned about  her chronic snoring, and chronic nasal stuffiness.  I offered ENT  evaluation.  She had drifted off Nasacort, but still has some, and can  try it again.  She demonstrates good technique with her metered Xopenex  inhaler.  She continues to look sunburned, but assures me that she is  not staying out in the sun, and this is the side effect of her Iressa.   MEDICATIONS:  1. Foradil 1 inhalation b.i.d.  2. Nasacort being used intermittently.  3. Iressa.  4. Protonix.  5. Clonazepam 0.5 mg nightly.  6. Imitrex p.r.n.  7. Ambien 10 mg.  8. Xopenex HFA rescue inhaler.   NO MEDICATION ALLERGY.   OBJECTIVE:  She looks sunburned.  Weight 183 pounds.  BP 100/78.  Pulse 104.  Room air saturation 98%.  Turbinate edema.  She can breathe through her nose.  I do not see polyps  or postnasal drip.  Pharynx is clear.  Trace wheeze.  Breathing is unlabored.  HEART:  Sounds regular without murmur.  I do not find adenopathy at the neck or supraclavicular areas.   IMPRESSION:  1. Rhinitis.  2. Snoring.  3. Chronic obstructive pulmonary disease with history of      adenocarcinoma of  lung.   PLAN:  1. I have asked her to use up her remaining Nasacort 2 sprays each      nostril daily.  2. Pending appointment with Dr. Christia Reading April 08, 2007 at 9:45.  3. Schedule return here 4 months, earlier p.r.n.     Clinton D. Maple Hudson, MD, Tonny Bollman, FACP  Electronically Signed    CDY/MedQ  DD: 02/28/2007  DT: 02/28/2007  Job #: [id number]   cc:   Valentino Hue. Magrinat, M.D.  [facility]  Antony Contras, MD

## 2011-01-15 NOTE — Assessment & Plan Note (Signed)
OFFICE VISIT   [name], COPPESS  DOB:  1966-05-29                                        October 27, 2007  CHART #:  [id number]   The patient comes in today and we checked her Port-A-Cath removal site  and it is healing well.  Her blood pressure is 124/86, pulse 86,  respirations 18, and saturations were 98%.  We plan to see her back  again as scheduled with the next CT scan.   Ines Bloomer, M.D.  Electronically Signed   DPB/MEDQ  D:  10/27/2007  T:  10/28/2007  Job:  [id number]

## 2011-01-15 NOTE — Assessment & Plan Note (Signed)
OFFICE VISIT   [name], [name]  DOB:  19-Feb-1966                                        February 10, 2008  CHART #:  [id number]   [name] comes today.  Her CT scan showed no evidence of recurrence.  Her Port-A-Cath site is well healed.  We removed it.  She is doing well  overall.  She does complain of chronic shortness of breath and told her  that there is nothing we could do.  She is presently on medications  including Ambien, Nasacort, Foradil, and Klonopin.   PLAN:  I will plan to see her back again in 6 months with another CT  scan, she will be seeingyou at that time.   Ines Bloomer, M.D.  Electronically Signed   DPB/MEDQ  D:  02/10/2008  T:  02/11/2008  Job:  [id number]   cc:   Valentino Hue. Magrinat, M.D.

## 2011-01-15 NOTE — Letter (Signed)
July 17, 2010   Clinton D. Maple Hudson, MD, FCCP, FACP  [facility]  [address]  [place]  [place] [address]   Re:  KEATYN, LUCK             DOB:  1966-02-13   Dear Joni Fears,   I saw the patient back today and got a followup CT scan for her cancer.  I do not have official report but there has definitely been a change as  far as the parenchymal lesions.  She is also getting worked up for right  breast mass and is getting an MRI of the breast today.  Her blood  pressure was 100/78, pulse 100, respirations 18, saturations were 98%.  She will be seeing you on November 25.  I would like to get your  opinion.  I think she might need to have a bronchoscopy, but I will wait  for final reading and also to see what her breast workup shows.  I will  see her back with a chest x-ray in 3 weeks.   Ines Bloomer, M.D.  Electronically Signed   DPB/MEDQ  D:  07/17/2010  T:  07/17/2010  Job:  [id number]

## 2011-01-15 NOTE — Assessment & Plan Note (Signed)
OFFICE VISIT   [name], [name]  DOB:  1966-04-02                                        August 09, 2009  CHART #:  [id number]   Blood pressure is 110/60, pulse 76, respirations 18, sats 100%.  Her  lungs are clear to auscultation and percussion.  CT scan at 7 years was  negative.  We will get another chest x-ray in 6 months.  She is doing  well overall, has lost from 190 pounds down to 145 pounds.   Ines Bloomer, M.D.  Electronically Signed   DPB/MEDQ  D:  08/09/2009  T:  08/10/2009  Job:  [id number]

## 2011-01-18 NOTE — H&P (Signed)
[facility]  Patient:    [name], [name]                      MRN: [id number] Adm. Date:  12/12/00 Attending:  Zenaida Niece, M.D.                         History and Physical  CHIEF COMPLAINT:  Irregular menses and leiomyomatous uterus.  HISTORY OF PRESENT ILLNESS:  This is a 45 year old white female gravida 3, para 1-0-1-1 with an LMP of January 27 who bled most of March 2002 who was seen for an annual examination on January 28.  She had previously been seen on January 15 complaining of bleeding since January 2 being off of a birth control pill for approximately 13 months.  She had previously been having regular menses with some mild cramping and back pain.  She reported at that time that her menses were gradually getting heavier.  Examination at that time revealed an anteverted uterus upper limits of normal size which was slightly tender.  She was put on doxycycline, Anaprox, and Provera and then seen for her full examination on January 28.  She reported she took all the doxycycline and finished the Provera on January 27.  The bleeding had stopped but she then began bleeding again on January 28.  Examination at that time revealed an anteverted uterus upper limits of normal size which was still slightly tender and at that point I felt I could palpate an anterior fibroid.  Pelvic ultrasound was then performed which reveals a 4.6 x 4.1 x 4.2 cm fundal mass consistent with a leiomyoma and may be in a slightly submucosal location.  The ultrasound was otherwise normal.  Patient has been offered medical and surgical therapy and desires definitive surgical therapy.  It has been noted on her preoperative laboratories that she is currently very early pregnant and the patient desires to proceed with hysterectomy.  PAST OBSTETRICAL HISTORY:  One vaginal delivery at term weighing 7 pounds 7 ounces and one spontaneous abortion.  PAST MEDICAL HISTORY:   None.  PAST SURGICAL HISTORY:  Finger surgery, breast biopsy, left wrist surgery.  CURRENT MEDICATIONS:  None.  ALLERGIES:  No known drug allergies.  SOCIAL HISTORY:  Patient is married.  Smokes a pack of cigarettes a day. Denies alcohol or drug use.  REVIEW OF SYSTEMS:  Otherwise negative.  FAMILY HISTORY:  No GYN or colon cancer.  PHYSICAL EXAMINATION  VITAL SIGNS:  Weight 172 pounds, blood pressure 102/72, pulse 88.  GENERAL:  She is a well-developed, well-nourished white female who is in no acute distress.  HEENT:  Pupils are equally round and reactive to light and accommodation. Extraocular muscles are intact.  Oropharynx is clear without erythema or exudates.  NECK:  Supple without lymphadenopathy or thyromegaly.  LUNGS:  Clear to auscultation.  HEART:  Regular rate and rhythm without murmur.  ABDOMEN:  Soft, nontender, nondistended without palpable masses.  BREASTS:  In sitting and supine position reveals no dominant masses, adenopathy, skin change, or nipple discharge.  EXTREMITIES:  No edema, nontender.  DTRs are 2/4 and symmetric.  PELVIC:  External genitalia reveals no lesions.  On speculum examination she had a normal appearing cervix.  Pap smear was performed which returned within normal limits.  On bimanual examination she has an upper limits of normal size anteverted uterus which is slightly tender and a palpable anterior fibroid. She has no adnexal  masses.  The uterus has mild descensus.  ASSESSMENT:  Symptomatic leiomyomatous uterus.  Medical and surgical options have been discussed with the patient and she desires definitive surgical therapy.  This has also been discussed in light of her preoperative positive pregnancy test and the patient does wish to proceed with a hysterectomy. Risks of surgery including bleeding, infection, damage to surrounding organs, and permanent infertility have been discussed with the patient and she agrees to  proceed.  PLAN:  Admit the patient for a vaginal hysterectomy with possible bilateral salpingo-oophorectomy and cystoscopy.  Patient understands that there is the possibility that we may have to convert this into an abdominal hysterectomy. DD:  12/09/00 TD:  12/09/00 Job: [id number]

## 2011-01-18 NOTE — H&P (Signed)
NAME:  Lori, [name] NO.:  [id number]   MEDICAL RECORD NO.:  [id number]                   PATIENT TYPE:   LOCATION:                                       FACILITY:  [facility]   PHYSICIAN:  Ines Bloomer, M.D.              DATE OF BIRTH:  December 15, 1965   DATE OF ADMISSION:  07/15/2002  DATE OF DISCHARGE:                                HISTORY & PHYSICAL   PULMONOLOGIST:  Danice Goltz, M.D. [facility]   GYNECOLOGIST:  Zenaida Niece, M.D.   HISTORY OF PRESENT ILLNESS:  [name] is a 45 year old female, referred  by Dr. Jayme Cloud and Dr. Jackelyn Knife for evaluation of right upper lobe mass  and enlarged hilar lymph nodes. She has been followed for an indolent  growing right upper lobe mass since its first discovery in 4/02. Her last CT  of the chest was three to four weeks ago and shows further enlargement. She  underwent bronchoscopy and mediastinoscopy on 11/7 by Dr. Edwyna Shell with  pathology finding of nodes negative for malignancy. She presents 07/15/02  for right video-assisted thoracoscopy, right upper lobe wedge resection. Ms.  [name] does not give any history of chronic cough or excessive sputum  production. She has no unexplained weight loss or gain. No fever or chills.  She has no dyspnea on exertion. No shortness of breath. She has no history  of hemoptysis.   Also, her review of systems is fairly scanty. She has no prior history of  diabetes or thyroid disease. Cardiac history including no myocardial  infarction, no coronary artery disease. She has no prior history of  dysrhythmias or hypertension. Gastrointestinal:  No history of  gastroesophageal reflux disease. No prior history of peptic ulcer disease.  Neurological:  She has had no TIA or CVA. No seizures. No unilateral motor  weakness. No speech difficulties. No amaurosis fugax. She does not have a  significant past medical history.   PAST MEDICAL HISTORY:  1. Hysterectomy in 4/02.  2.  Status post bronchoscopy and mediastinoscopy 07/09/02.  3. Status post resection of fibrocystic right breast mass at the age of 65     which was benign.  4. Status post resection of cyst on the third finger of the right hand.   MEDICATIONS:  No medications.   ALLERGIES:  Hydrocodone and codeine which give headache and itching.   SOCIAL HISTORY:  The patient is a widow. She has one healthy child. She has  three step children. She works at [employer]. She occasionally takes alcohol  beverages. Currently smokes three-quarter pack per day which is down from  one-and-a-half packs per day. She has smoked for the past 24 years.   FAMILY HISTORY:  Mother is alive and healthy. Father died between the ages  of 37 of 73 of lung cancer. She has one brother who is healthy.   PHYSICAL EXAMINATION:  GENERAL:  This is an alert and  oriented female in no  acute distress. No chronic cough. She is well-nourished. She is currently  active and still working.  HEENT:  She is normocephalic, atraumatic. EYES:  Pupils are equal, round,  and reactive to light. Extraocular movements are intact. She does not wear  dentures.  NECK:  Supple. 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 No carotid bruits  auscultated.  LUNGS:  Clear to auscultation and percussion bilaterally without wheezes,  rhonchi, or rales.  HEART:  Regular, rate, and rhythm without rubs or gallops.  ABDOMEN:  Soft, nondistended, bowel sounds are present throughout, no masses  on deep palpation,.  EXTREMITIES:  Show no evidence of clubbing, cyanosis, edema, or ulcerations.  The left wrist bears evidence of trauma to the flexor aspect of the wrist  from putting her hand through a glass window. Femoral pulses are 4/4  bilaterally. She has no history of lower extremity claudication.  NEUROLOGIC EXAM:  Nonfocal. Gait is steady. Muscle strength is equivocal  bilaterally.   IMPRESSION:  Right upper lobe mass.   PLAN:  Right video-assisted thoracoscopy, right  upper lobe wedge resection,  Dr. Norton Blizzard, 07/15/02.     [name], P.A.                    Ines Bloomer, M.D.    GM/MEDQ  D:  07/14/2002  T:  07/14/2002  Job:  [id number]

## 2011-01-18 NOTE — Op Note (Signed)
   NAME:  [name], [name]                       ACCOUNT NO.:  [id number]   MEDICAL RECORD NO.:  [id number]                   PATIENT TYPE:  OIB   LOCATION:  2870                                 FACILITY:  [facility]   PHYSICIAN:  Ines Bloomer, M.D.              DATE OF BIRTH:  [birth year]   DATE OF PROCEDURE:  08/02/2002  DATE OF DISCHARGE:                                 OPERATIVE REPORT   PREOPERATIVE DIAGNOSIS:  Stage IIIA non-small-cell lung cancer.   POSTOPERATIVE DIAGNOSIS:  Stage IIIA non-small-cell lung cancer.   OPERATION PERFORMED:  Insertion of left subclavian Port-A-Cath.   SURGEON:  Ines Bloomer, M.D.   ANESTHESIA:  IV sedation and 1% Xylocaine.   DESCRIPTION OF PROCEDURE:  After prepping and draping the left chest, an  area was infiltrated with 1% Xylocaine below the left clavicle and a left  subclavian puncture was performed and under fluoroscopy, a guidewire as  threaded into the right atrium.  A stab wound was made around the guidewire  and over the guidewire was passed the dilator and a peel-away sheath.  The  guidewire was removed and the dilator was removed and through the peel-away  sheath was passed the Port-A-Cath tubing, which was 8-French in size.  It  was then confirmed to be in the right atrium under fluoroscopy and then  another area was infiltrated with 1% Xylocaine inferior to this, an incision  was made and dissection was carried down, dissecting out a pocket for the  reservoir.  The tubing was then tunneled down to the pocket using the  [facility] tendon forceps.  It was again measured, appropriately cut and then  connected with the connector to the reservoir.  The reservoir was placed in  the pocket and sutured in place with 2-0 silk.  Wounds were closed with 3-0  Vicryl and Dermabond and the catheter aspirated easily and flushed easily.  The patient was returned to the recovery room in stable condition.          Ines Bloomer, M.D.    DPB/MEDQ  D:  08/02/2002  T:  08/02/2002  Job:  [id number]

## 2011-01-18 NOTE — Op Note (Signed)
[facility]  Patient:    ILAYDA, TODA               MRN: [id number] Proc. Date: 12/12/00 Adm. Date:  [id number] Attending:  Michaele Offer                           Operative Report  PREOPERATIVE DIAGNOSES:  Symptomatic leiomyomata uterus and early pregnancy.  POSTOPERATIVE DIAGNOSES:  Symptomatic leiomyomata uterus and early pregnancy.  PROCEDURE:  Transvaginal hysterectomy and cystoscopy.  SURGEON:  Lavina Hamman, M.D.  ASSISTANT:  Huel Cote, M.D.  ANESTHESIA:  General endotracheal tube.  ESTIMATED BLOOD LOSS:  300 cc.  CHEMOPROPHYLAXIS:  Ancef 1 gm preop.  FINDINGS:  Slightly enlarged uterus with a large anterior fibroid. She had normal appearing tubes and ovaries.  COUNTS:  Correct.  CONDITION:  Stable.  DESCRIPTION OF PROCEDURE:  The patient was taken to the operating room and placed in the dorsal supine position. General anesthesia was induced and she was placed in mobile stirrups. Her perineum and vagina were then prepped and draped in the usual sterile fashion, her bladder drained with a red rubber catheter. A weighted speculum was inserted into the vagina and a Deaver used to retract anteriorly. The cervix was grasped with Loman [place] and the cervicovaginal mucosa was infiltrated with a dilute solution of Pitressin. The cervicovaginal mucosa was then incised with electrocautery. The bladder was pushed off anteriorly and attempted to enter the posterior cul-de-sac. However, this tissue pushed off and was very weak. The uterosacral ligaments were clamped, transected and ligated on each side with #1 chromic and tagged for later use. At this time, I was able to identify the posterior peritoneum and enter it sharply. The Bonnano speculum was used to retract the rectum posteriorly. The bladder was pushed off further anteriorly. Cardinal ligaments and uterine arteries were clamped, transected, and ligated on  each side with #1 chromic. The anterior peritoneum was identified and entered and the Deaver used to retract the bladder anteriorly. Lower broad ligaments were clamped, transected and ligated with #1 chromic. I then was able to grasp the uterus posteriorly and pull it down through the incision. In doing this, a defect was made in the posterior myometrium, membranes were ruptured and she expelled a small fetus through this tear in the myometrium. This was sent with the final specimen. The utero-ovarian pedicles were clamped, transected, and ligated on each side doubly with #1 chromic. In doing this, the uterus was removed. Bleeding was controlled on each side with figure-of-eight sutures of #1 chromic. The peritoneum was identified and run to the vaginal cuff with a running locking suture of #1 Vicryl to aid in hemostasis. The uterosacral ligaments were then plicated in the midline with #0 silk. Adequate hemostasis was achieved and the vagina was closed with 2-0 Vicryl in a running locking fashion. The patient was then given indigo carmine IV and her bladder drained with a Foley catheter. 200 cc of water was placed into the bladder and the bladder was inspected with the 70 degree cystoscope. Indigo carmine was seen to come from both ureteral orifices without obstruction. The bladder appeared normal with no sutures in it. The cystoscope was removed and the Foley catheter was reinserted. All instruments were removed from the vagina. The patient was taken down from stirrups and extubated in the operating room and taken to the recovery room in stable condition after tolerating the procedure well. DD:  12/12/00 TD:  12/12/00 Job: [id number]

## 2011-01-18 NOTE — Discharge Summary (Signed)
NAME:  [name], [name]                       ACCOUNT NO.:  [id number]   MEDICAL RECORD NO.:  [id number]                   PATIENT TYPE:  INP   LOCATION:  2013                                 FACILITY:  [facility]   PHYSICIAN:  Ines Bloomer, M.D.              DATE OF BIRTH:  Feb 09, 1966   DATE OF ADMISSION:  07/15/2002  DATE OF DISCHARGE:  07/20/2002                                 DISCHARGE SUMMARY   ADMISSION DIAGNOSIS:  Right upper lobe mass.   PAST MEDICAL HISTORY:  Hysterectomy in April 2002, secondary to symptomatic  fibroids.  Also, elevated BCGs that continued after hysterectomy.  Status  post resection of fibrocystic right breast mass April 21, 2002, benign.   ALLERGIES AND INTOLERANCES:  HYDROCODONE and CODEINE cause headache and  itching.   DISCHARGE DIAGNOSIS:  Metastatic adenocarcinoma of the lung.   HISTORY OF PRESENT ILLNESS:  The patient is a 45 year old Caucasian female.  She underwent a CT scan in April 2002 which revealed a small 1 cm nodule in  the right upper lobe, spiculated nodule in the posterior lobe.  These masses  were followed with serial CT scans.  She was seen in consultation by Danice Goltz, M.D., who felt this might be related to asthma and  __________mycosis.  There was no mediastinal adenopathy present at the April  2002 CT scan.  Because she continues to have elevated Beta HCG levels, she  had a repeat CT scan.  This examination showed three new right paratracheal  and right hilar adenopathy with largest being 2.6 cm in size.  The nodule in  the posterior segment of the right upper lobe increased in size to 2.3 x 1.3  cm.  The patient was referred to [facility] and she was evaluated in the office by  Dr. Edwyna Shell on June 29, 2002.  After examination of the patient, review of  all available records, Dr. Edwyna Shell recommended preceding with a bronchoscopy  and mediastinoscopy for diagnosis.  Pathology findings from that procedure  included nodes  negative for malignancy.  Because of his findings, Dr. Edwyna Shell  recommended preceding with an open surgical procedure for definitive  diagnosis and treatment.  The procedure risks and benefits were discussed  with the patient and she agreed to proceed.   HOSPITAL COURSE:  On July 15, 2002, the patient was electively admitted  to [facility] under the care of Ines Bloomer, M.D.  She underwent the following surgical procedures:  (1) right VATS, (2) right  mini thoracotomy and wedge resection of right upper lobe, (3) multiple lymph  nodes biopsies.  Intraocular frozen section pathology revealed necrotic  tumor which was consistent with adenocarcinoma.  The patient tolerated this  procedure well.  She was transferred in stable condition to the PACU.  She  has remained hemodynamically stable since surgery and her postop/ course has  been uneventful. She was extubated in the  immediate postoperative period.  Her pain was well controlled for several days with combination of epidural  anesthesia and oral pain medications, both chest tubes were removed by  postoperative day #2.   Oncology consultation on July 18, 2002, by Valentino Hue. Magrinat, M.D.,  pathology stages a T1, N2 adenocarcinoma, stage III.  After examination of  the patient and discussion, Dr. Darnelle Catalan recommended following up with  radiation oncology over the next few days and to return to see him in the  outpatient setting in 10 to 14 days.  His tentative plan is for x-ray  therapy with weekly chemotherapy, then chemotherapy three times a week x4  weeks.  Appointments will be arranged prior to her discharge.   As the patient was a long time cigarette smoker prior to her admission, she  has been offered and accepted the use of nicotine patch and Wellbutrin to  help her with her smoking cessation.   CT scan of the brain as well as whole body bone scan were performed July 19, 2002.  Results are not  available at this dictation.  The patient is  progressing well and recovering from her surgery.  Her vital signs the  morning of July 19, 2002, are stable. She is afebrile.  Her lungs are  clear.  Her incisions are healing well.  She is tolerating liquids well. She  does not have much of an appetite.  Her pain is well-controlled and she is  ambulating okay.  Her only complaints is a headache.  If the patient  continues to progress, it is anticipated she will be ready for discharge  home tomorrow, July 20, 2002.   LABORATORY DATA:  On July 17, 2002, CBC showed white blood cells 19.2,  hemoglobin 11.8, hematocrit 34.3, platelets 246.  Chemistries showed sodium  137, potassium 3.6, chloride 108, CO2 23, BUN 1, creatinine 0.5, glucose  116, calcium 8.5.   CONDITION ON DISCHARGE:  Improved.   DISCHARGE INSTRUCTIONS:  1. Medications:     a. Tylox one to two p.o. q.4-6h. p.r.n. for pain.     b. Nicotine patch 21 mg. She is instructed to change the patch daily and        further instructed in no smoking while this patch is on.     c. Bupropion 150 mg p.o. b.i.d.     d. Flonase two sprays each nostril daily.  2. Activity:  She has been asked to refrain from any driving or any heavy     lifting.  She has been asked to continue her breathing exercises and     daily walking.  3. Diet is not restricted.  4. Wound care:  She may shower with mild soap and water.  If her incisions     become red, hot, swollen or if she has any fever greater than 101 degrees     F, she is to call Dr. Scheryl Darter office.   FOLLOW UP:  She will have appointments made with radiation oncology as well  as Dr. Darnelle Catalan. These appointments will be made prior to discharge.  She  has an appointment to see Dr. Edwyna Shell at the [facility] office on Wednesday,  July 28, 2002, at 1:40. She will have a chest x-ray taken at [facility] at 12:30 that day.  Dr. Edwyna Shell will also be discussing arrangements to have a  Port-A-Cath  placed.     Claudette Royston Sinner, R.N.  Ines Bloomer, M.D.    CTK/MEDQ  D:  07/19/2002  T:  07/19/2002  Job:  [id number]   cc:   Valentino Hue. Magrinat, M.D.  [address] [facility]  [place]  [place] [address]  Fax: [phone]   Radiation Oncology  [facility]   Danice Goltz, M.D. [facility]  [address] [place], [place] [address]  Fax: 1   Zenaida Niece, M.D.  [address]  [place]  [place] [address]  Fax: [phone]

## 2011-01-18 NOTE — Discharge Summary (Signed)
[facility]  Patient:    [name], [name]               MRN: [id number] Adm. Date:  [id number] Disc. 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  DISCHARGE DIAGNOSIS:  Leiomyomatous uterus and early intrauterine pregnancy.  PROCEDURES:  Vaginal hysterectomy and cystoscopy.  COMPLICATIONS:  None.  CONSULTATIONS:  None.  HISTORY OF PRESENT ILLNESS:  Briefly, this is a 45 year old white female, gravida 3, para 1-0-1-1, with an LMP of January 27 who bled most of March 2002 and was previously seen for an annual exam on January 28.  She has regular periods which have been gradually increasing in bleeding and cramping.  An exam in January revealed an upper limits of normal size uterus.  She was treated with doxycycline, Anaprox, and Provera, and had no improvement in her symptoms.  Pelvic ultrasound revealed a 4.6 x 4.1 x 4.2 cm fundal mass which is probably consistent with a leiomyoma and is in a slightly submucosal location.  The patient desires definitive surgical therapy.  Preoperative labs revealed positive pregnancy test and, in spite of this, she wished to proceed with surgical therapy.  PAST MEDICAL HISTORY:  Significant for one vaginal delivery at term and one spontaneous abortion.  Surgical history significant for finger surgery, breast biopsy, and left wrist surgery.  PHYSICAL EXAMINATION:  Abdomen was benign without palpable masses.  Pelvic exam reveals normal external genitalia.  Speculum exam reveals a normal cervix and Pap smear was normal.  Bimanual exam shows an upper limits of normal size anteverted uterus which is slightly tender and she has a palpable anterior fibroid and no adnexal masses.  HOSPITAL COURSE:  Patient was admitted on the day of surgery and underwent a vaginal hysterectomy  with cystoscopy.  This was performed under general anesthesia with an estimated blood loss of 300 cc.  She had an 8 to 9 week size uterus with a large anterior fibroid.  Surgery went without complications.  A preoperative chest x-ray ordered by anesthesia due to the patients history of smoking revealed a spiculated lesion in the right upper lobe.  This is to be evaluated postoperatively.  Postoperatively, she did very well, was rapidly able to ambulate, tolerated a regular diet, and had good pain control.  Preoperative hemoglobin was 11.4 and postoperative was 8.4. Prior to discharge on the evening of April 12, she did have a CT scan of her chest which reveals multiple pulmonary nodules with the largest one being 1.3 x 1.4 cm in the posterior aspect of the right upper lobe.  She is to be followed up with this after discharge by Dr. Danice Goltz.  On the evening of April 12, she was felt to be otherwise stable for discharge home.  CONDITION ON DISCHARGE:  Stable.  DISPOSITION:  Discharged to home.  DISCHARGE INSTRUCTIONS:  Her diet is regular diet.  Activity is pelvic rest, no strenuous activity and no driving.  Her follow-up with me is in six weeks and I am going to arrange follow-up for her the week after discharge with Dr. Danice Goltz.  MEDICATIONS:  Percocet and Toradol p.r.n. pain. DD:  01/10/01 TD:  01/12/01 Job: [id number]

## 2011-01-18 NOTE — Op Note (Signed)
   NAME:  [name], [name]                       ACCOUNT NO.:  [id number]   MEDICAL RECORD NO.:  [id number]                   PATIENT TYPE:  OIB   LOCATION:  2899                                 FACILITY:  [facility]   PHYSICIAN:  Ines Bloomer, M.D.              DATE OF BIRTH:  07-Jan-1966   DATE OF PROCEDURE:  DATE OF DISCHARGE:  07/09/2002                                 OPERATIVE REPORT   PREOPERATIVE DIAGNOSIS:  Enlarging right upper lobe mass, mediastinal  adenopathy.   POSTOPERATIVE DIAGNOSIS:  Enlarging right upper lobe mass, mediastinal  adenopathy.   OPERATION PERFORMED:  Fibrobronchoscopy and mediastinoscopy.   DESCRIPTION OF PROCEDURE:  After general anesthesia, a fibrobronchoscope was  passed through the endotracheal tube.  The right upper lobe, right middle  lobe, and right lower lobe orifices were normal.  Left upper lobe and left  lower lobe orifices were normal.  Cytologies and brushings and cultures were  taken from the right upper lobe.  Fibrobronchoscope was removed.  The  anterior neck was prepped and draped in the usual sterile manner.  A  transverse incision was made and carried down with electrocautery through  the subcutaneous tissue and fascia.  Pretracheal fascia was entered.  The  electroablation was carried out.  There was enlarged and highly inflamed  nodes that were very scarred, particularly in the 4R area.  Multiple  biopsies were taken, part of this was sent for culture.  The mediastinoscope  was removed.  The strap muscles were closed with 2-0 Vicryl, subcutaneous  tissue with 3-0 Vicryl, and DERMABOND for the skin.  The patient returned to  the recovery room in stable condition.                                               Ines Bloomer, M.D.    DPB/MEDQ  D:  07/09/2002  T:  07/10/2002  Job:  [id number]   cc:   Danice Goltz, M.D. [facility]  [address] [place], [place] [address]  Fax: 1   Zenaida Niece, M.D.  [address]  [place]  [place] [address]  Fax: [phone]

## 2011-01-18 NOTE — Op Note (Signed)
NAME:  [name], [name]                       ACCOUNT NO.:  [id number]   MEDICAL RECORD NO.:  [id number]                   PATIENT TYPE:  INP   LOCATION:  2858                                 FACILITY:  [facility]   PHYSICIAN:  Ines Bloomer, M.D.              DATE OF BIRTH:  [birth year]   DATE OF PROCEDURE:  07/15/2002  DATE OF DISCHARGE:                                 OPERATIVE REPORT   PREOPERATIVE DIAGNOSIS:  Right upper lobe mass.   POSTOPERATIVE DIAGNOSIS:  Probable stage IIIA nonsmall cell lung cancer.   OPERATION PERFORMED:  Right VATS, right thoracotomy, right upper lobectomy  and node dissection.   SURGEON:  Ines Bloomer, M.D.   FIRST ASSISTANT:  Loura Pardon, P.A.-C.   INDICATION:  This is a 45 year old patient that had been followed with a  right upper lobe lesion.  She also had a history of having elevated hCG's  and had a previous hysterectomy for leiomyomas.  CT scan showed that this  right upper lobe lesion had been enlarging as well as some mediastinal  adenopathy.  She underwent bronchoscopy and mediastinoscopy and the biopsy  of the nodes were negative.   DESCRIPTION OF PROCEDURE:  She was brought to the operating room for  resection of the lesion, turned to the right lateral thoracotomy position, a  single-lumen tube with a bronchial blocker was inserted.  She was prepped  and draped in the usual sterile manner.  Two trocar sites were made in the  anterior and posterior axillary line.  At the seventh intercostal space two  trocars were inserted and a 30 B-scope was inserted and the lesion was seen  in the posterior segment of the right upper lobe.  An incision was made over  the fourth intercostal space and dissection carried down with electrocautery  to the subcutaneous tissue.  The serratus was split and the fourth  intercostal space was entered and the lung was deflated and then the right  upper lobe lesion was resected using a PLC-75 and the EZ-45  stapler.  Frozen  section revealed a necrotic tumor which _____________ was consistent with  adenocarcinoma so it was decided to proceed with a resection.  Resection was  started in the hilum and there was a lot of inflammation and scarring in the  hilum.  The azygos vein had to be dissected up superiorly and there were  several large nodes in the this area.  These were dissected free.  There was  a large 10-R node that was between the apicoposterior branch of the artery  to the right upper lobe and the main branch of the pulmonary artery as well  as it was stuck to the superior pulmonary vein.  Attention was started  posteriorly and dissection was carried down dissecting posterior, dissecting  out several 11-R and 10-R nodes in the fissure.  Section was made in the  fissure dissecting out the  artery and then dividing the fissure with the EZ-  45 stapler.  This exposed the posterior branch of the right upper lobe and  it was dissected out, doubly ligated and divided, _________ several 11-R  nodes that were enlarged but do not appear to be firm.  Attention was turned  more anteriorly and the apical posterior branch was carefully dissected out  dissecting out all three branches distally and then dissecting proximally  and then getting around it proximally, it was ligated proximally and  distally with 0-silk and then divided and then dissected out a very large 10-  R node.  The 10-R node was then dissected free off the bronchus and then  dissected off the superior pulmonary vein.  The apicoposterior branch of the  superior pulmonary vein was dissected out, doubly ligated and divided and  then finally the node was dissected off the pulmonary vein and was sent for  frozen section and was positive for metastatic disease.  The patient  obviously had advanced disease.  The superior pulmonary vein was then  dissected free off of this, dissected up and ligated and stapled and ligated  with the  autosuture stapler.  The main branch of the pulmonary artery was  then dissected superiorly and a small apicoanterior branch of the pulmonary  artery to the right upper lobe was ligated with 2-0 silk, clipped, and  divided.  This allowed [place] to complete the minor fissure with three  applications of the EZ-45 an then there was a 10-R node that was stuck to  the bronchus and this was dissected up, it appeared to be positive too, and  the bronchus was then stapled with an EZ-45 stapler and divided and the  right upper lobe was removed.  The right upper lobe was then sutured to the  right lower lobe to prevent torsion with 2-0 silk.  More nodes were  dissected free from underneath the azygos vein, a 4-R node was dissected  free and sent for permanent section, there were still some other nodes that  were in the mediastinum that were marked with clips and the inferior  pulmonary ligament was taken down. Two chest tubes were brought in through  the trocar sites and tied in place with 0-silk.  The chest was closed with 4  pericostals, 2 trocars in the 4 pericostals, FocalSeal was applied to the  staple line which _____________________ then the ultraviolet wand.  The lung  was reexpanded, the chest was closed, OnQ was brought in under the  pericostals catheters medially and laterally before a Marcaine block was  done in the usual fashion.  The chest was closed in layers with 1-Vicryl in  the muscle layer, 2-0 Vicryl in the subcutaneous tissue, 3-0 Vicryl in a  subcuticular stitch and Dermabond to the skin.  The patient was returned to  the recovery room in serious condition.                                               Ines Bloomer, M.D.    DPB/MEDQ  D:  07/15/2002  T:  07/15/2002  Job:  [id number]   cc:   Danice Goltz, M.D. [facility]  [address] [place], [place] [address]  Fax: 1   Zenaida Niece, M.D.  [address] [place]  [place] [address]  Fax: [phone]

## 2011-01-18 NOTE — Consult Note (Signed)
NAME:  [name], [name]                       ACCOUNT NO.:  [id number]   MEDICAL RECORD NO.:  [id number]                   PATIENT TYPE:  INP   LOCATION:  3316                                 FACILITY:  [facility]   PHYSICIAN:  Valentino Hue. Magrinat, M.D.            DATE OF BIRTH:  [birth year]   DATE OF CONSULTATION:  DATE OF DISCHARGE:                                   CONSULTATION   The patient is a 45 year old [place] woman referred by Dr. Edwyna Shell for  evaluation and treatment in the setting of newly diagnosed lung cancer.   The patient had a CT scan on 12/12/00 obtained as part of a gynecologic  evaluation, and this showed a 1.3 cm mass in the right upper lobe.  She was  referred to Dr. Danice Goltz and the patient did go up probably one time,  but then dropped out of follow up.  More recently, the patient's CT scans  were repeated because she had a persistently elevated Beta hCG.  This showed  the right hilar mass to have increased from 1.3 to 2.3 cm and now there was  right hilar, right paratracheal, and right precarinal adenopathy, which was  not present before.  With this information, the patient was referred to Dr.  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   With this information, the patient is referred for further evaluation and  treatment.   PAST MEDICAL HISTORY:  Significant for fibroids.  The patient being status  post simple hysterectomy, April of 2002.  She has a history of ovarian cysts  bilaterally, including a right hemorrhagic cyst.  She had a benign breast  biopsy at the age of  3. She had left wrist surgery remotely, surgery to the  third digit of the right hand for a cyst.  History of hemorrhoids and a  history of migraines.  She also has COPD/interstitial lung disease.  There  are multiple subpleural small densities in both lungs which are not  calcified and are of uncertain significance.   FAMILY HISTORY:  The patient's mother is alive and present today.  The  patient's father died in his mid 91s from lung cancer.  All his history and  past is at [facility].  The patient has one brother, 26, also  present today.  He is a smoker.  He does not have a cancer history.   GYNECOLOGIC HISTORY:  The patient has T3, F1, P0, A2, L1.  She is status  post hysterectomy, but no salpingoophorectomy.  She thinks she  may be  getting hot flashes at this point.   SOCIAL HISTORY:  She works at __________, where she has worked for 3-1/2  years. She is a widow. Her child, [name], is 28 years old.  She has a history  of repeated urinary tract infections which have been evaluated by a  urologist.  The patient has three children, aged 20/17, and 28, who live in  the [place] area, and she is not in any contact with them at present.  She lives alone with her daughter, but is thinking of moving in with her  boyfriend, [name], who is present today.  His phone number is [phone].  He is a Actuary and has no children of his own.  The  patient has Medicaid health maintenance.  She does not know her cholesterol  level.  She had one mammogram many, many years ago.  She used to drink up  to 18 beers on a weekend day, but has not had anything to drink in 3 weeks  and plans to completely quit.  She smoked up to 1-1/2 packs per day for 23  years, but has not smoked for 5 days and is planning to completely quit. She  does not have a history of blood transfusions, has never received a flu shot  or pneumonia shot, does not have a living will, and does not have a  health  care power of attorney, but this was discussed at length today.   ALLERGIES:  She is intolerant of narcotics, which cause itching.   MEDICATIONS:  She is now on an inhaler and some Vioxx, as well as some  antibiotics.  She will be on Wellbutrin at the time of discharge.   REVIEW OF SYSTEMS:  She was not having significant symptoms regarding the  lung tumor, and in fact, the work up was for persistently elevated Beta hCG,  which is now presumed to be secondary to the tumor itself.  She has a  history of migraines, but this has not been particularly active recently.  She denies visual changes, diplopia, fainting, or seizure history.  Never  had a stroke.  Never had any heart disease that she is aware of.  No  problems with her bowels or bladder.   PHYSICAL EXAMINATION:  VITAL SIGNS:  She is a young white female with a  heart rate of 103.  Blood pressure 104/55.  Saturation 91% on room air.  Respiratory rate 22.  Temperature 98.8.  PUPILS:  Equal and reactive.  There is no icterus.  OROPHARYNX:  Clear.  NECK: Supple.  There is no thyromegaly.  There is no peripheral adenopathy.  LUNGS:  Auscultated anteriorly, no wheezes were appreciated.  HEART:  Regular rate and rhythm.  No murmur noted.  ABDOMEN:  Benign.  MUSCULOSKELETAL:  No peripheral edema.  NEUROLOGIC:  Nonfocal.   LAB WORK:  Lab work this admission showed an FiO2 of .21, a PH of 7.42. PCO2  32, and PO2 84.  On November 6 preop, her white cell count was 10.6.  Hemoglobin 14.6, MCV  91.4, platelets 300,000.  She had a creatinine of 0.6.  BUN 9.  Normal liver  function tests.  Normal electrolytes.  PT was 13.3  and PTT 32 seconds.  She is ABO group O positive.  I do not find a recent Beta hCG, but we will  obtain one as a baseline and she also will have a CEA as a baseline.   Her  films are as already described.  Bone scan and brain scan are pending.  IMPRESSION/PLAN:  A 45 year old [place] woman, status post right  upper  lobectomy, 07/15/02 for a T1 N2 adenocarcinoma, stage 3, with CT of the  brain and bone scan pending.   Today, we had a long discussion involving the boyfriend [name], the patient's  father and brother, and all the relatives regarding diagnosis, prognosis,  and treatment options.   PLAN:  Once the patient has her CT of the brain and bone scan, she is to  proceed to an outpatient mammogram and then to see a radiation oncologist  next week.  She will see me in 10-14 days for the definitive treatment and  plan, which will likely include radiation with weekly cisplatinum, and then  chemotherapy every two weeks times 4.  She will benefit from port placement.  I am starting her on Wellbutrin to help with smoking cessation and have  advised her to contact [facility] to get counseling on how to deal with her  20-year-old.  The patient does not have a living will or health care power of  attorney, but we will try to facilitate her obtaining these documents prior  to discharge.                                               Valentino Hue. Magrinat, M.D.    Ronna Polio  D:  07/18/2002  T:  07/18/2002  Job:  [id number]   cc:   Ines Bloomer, M.D.  [address]  [place]  [place] [address]  Fax: [phone]   Danice Goltz, M.D. [facility]  [address] [place], [place] [address]  Fax: 1   Zenaida Niece, M.D.  [address]  [place]  [place] [address]  Fax: [phone]

## 2011-01-18 NOTE — Assessment & Plan Note (Signed)
[facility]                             PULMONARY OFFICE NOTE   [name], [name]                    MRN:          [id number]  DATE:09/16/2006                            DOB:          1965/10/03    PROBLEMS:  1. Chronic insomnia.  2. Asthma with chronic obstructive pulmonary disease.  3. 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  4. Hemoptysis.  5. History of seizure.  6. Allergic rhinitis.   HISTORY OF PRESENT ILLNESS:  Her main complaint today, again, is  insomnia.  She disliked Rozerem, which she says made her feel drunk  the next day, and prefers Ambien 10 mg, without which she says she  sleeps only a few interrupted hours each night.  Her migraines have been  a little better.  She thinks she is catching a cold.  She is using her  Xopenex HFA inhaler only 2-3 times a month.  She disliked Asmanex, so  she quit it, preferring Foradil.  I discussed maintenance and anti-  inflammatory therapies today.  She still blames Advair for the seizure  that she had.  There has been no further hemoptysis.  A thyroid nodule  is being assessed and she may need surgery.  Her father died with COPD,  but was a heavy smoker.  She asked about inherited emphysema, so we will  check and alpha-1 antitrypsin level.   MEDICATIONS:  1. Foradil one b.i.d.  2. Ambien CR 12.5 mg q.h.s. p.r.n.  3. Nasacort AQ p.r.n.  4. Iressa.  5. Protonix.  6. Imitrex.  7. Occasional Ambien 10 mg.  8. Xopenex HFA inhaler.   ALLERGIES:  No known drug allergies.   OBJECTIVE:  Weight 186 pounds, blood pressure 110/70, pulse 86, room air  saturation 98%.  Wheezy at the end of a forced expiration with a little  bit of cough then.  Otherwise, breathing was unlabored with no dullness,  no stridor, no neck vein distention or edema.  Heart sounds regular and  normal.  No adenopathy at the neck or supraclavicular areas.   IMPRESSION:  1.  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  2. Chronic insomnia, multifactorial.  3. Chronic obstructive pulmonary disease.  4. History of lung cancer resection.   PLAN:  1. Blood for alpha-1 antitrypsin level.  2. Scheduled pulmonary function test.  3. Schedule return in four months, earlier p.r.n.  We will try her      again with a steroid inhaler.     Clinton D. Maple Hudson, MD, Tonny Bollman, FACP  Electronically Signed    CDY/MedQ  DD: 09/16/2006  DT: 09/17/2006  Job #: [id number]   cc:   [facility]  Valentino Hue. Magrinat, M.D.

## 2011-01-18 NOTE — Op Note (Signed)
   NAME:  EVER, HALBERG                       ACCOUNT NO.:  [id number]   MEDICAL RECORD NO.:  [id number]                   PATIENT TYPE:  INP   LOCATION:  2858                                 FACILITY:  [facility]   PHYSICIAN:  Guadalupe Maple, M.D.               DATE OF BIRTH:  October 07, 1965   DATE OF PROCEDURE:  07/15/2002  DATE OF DISCHARGE:                                 OPERATIVE REPORT   PROCEDURE:  Epidural catheter placement for postoperative pain control.   INDICATIONS FOR PROCEDURE:  The patient is a 45 year old white female with a  history of adenocarcinoma of the right upper lobe of the lung. She underwent  a right thoracotomy and right upper lobectomy by Ines Bloomer, M.D.  under general anesthesia.  Dr. Edwyna Shell requested that I insert an epidural  catheter to assist in her pain management postoperatively.  I did not have  the opportunity to discuss this with the patient preoperatively, but Dr.  Edwyna Shell and I both agreed that she would benefit from this form of pain  control given the nature of the surgery.   DESCRIPTION OF PROCEDURE:  Upon completion of the procedure while the  patient was still in the left lateral decubitus position, the lower thoracic  and upper lumbar areas were prepped and draped sterilely.  Two attempts were  made at the T8-9 and T10-11 interspaces without success.  An attempt at L1-2  interspace resulted in several drops of clear fluid coming back from the 18  gauge Tuohy. The needle was then removed and the epidural space was entered  in the L2-3 interspace and a catheter was buried 4 cm into the epidural  space. The needle was removed without difficulty and the catheter was taped  securely in place. The patient will be begun on an epidural fentanyl  infusion and followed on a daily basis by the anesthesia service.   Since it is possible that the dural sac may have been inadvertently  punctured, the patient will be followed closely for evidence of  post dural  puncture headache.                                               Guadalupe Maple, M.D.    DCJ/MEDQ  D:  07/15/2002  T:  07/15/2002  Job:  [id number]

## 2011-01-18 NOTE — Assessment & Plan Note (Signed)
[facility]                               PULMONARY OFFICE NOTE   [name], [name]                    MRN:          [id number]  DATE:05/19/2006                            DOB:          1966/08/04    PROBLEM LIST:  1. Chronic insomnia.  2. Asthma with chronic obstructive pulmonary disease.  3. Right upper lobe wedge resection 2003, adenocarcinoma with positive      nodes.  4. Hemoptysis.  5. History of seizure.  6. Allergic rhinitis.   HISTORY:  She still blames Advair for the seizure episode that she had had  in the past.  She complains now of reflux and has noticed active wheezing  for two weeks.  Protonix once a day is not controlling her reflux.  She is  due to have her Port-A-Cath removed.  Despite complaining of wheezing, she  has been using her ProAir rescue inhaler only about once a week.  I  commented on her sunburn and she blamed her medication but she still sits  out on the porch a lot in the afternoon exposed to the sun.   MEDICATIONS:  1. Foradil.  2. Ambien CR 12.5 mg.  3. Nasacort used p.r.n.  4. Iressa.  5. Imitrex.   ALLERGIES:  NO KNOWN DRUG ALLERGIES.   OBJECTIVE:  VITAL SIGNS:  Weight 193 pounds, blood pressure 110/82, pulse  regular 87, room air saturation 98%.  NECK:  I do not find adenopathy.  There is no neck vein distention or edema.  LUNGS:  She has mild diffuse wheeze and a little dry cough but is unlabored.  There is no dullness, no rub.  CARDIOVASCULAR:  Heart sounds are regular without murmur or gallop.   IMPRESSION:  1. Asthma with chronic obstructive pulmonary disease.  2. History of lobectomy for adenocarcinoma.  3. Esophageal reflux, symptomatic.  4. Excessive sun exposure.   PLAN:  1. Increase Protonix to b.i.d. or add as an afternoon dose Pepcid OTC.  2. Avoid sunburn.  3. Add steroid inhaler using Asmanex one inhalation daily.  4. Xopenex HFA inhaler 2 puffs q.i.d. p.r.n.  5. Nebulizer  treatment with Xopenex today 1.25 mg.  6. Schedule return 4 months earlier p.r.n.                                   Clinton D. Maple Hudson, MD, FCCP, FACP   CDY/MedQ  DD:  05/21/2006  DT:  05/23/2006  Job #:  [id number]   cc:   Valentino Hue. Magrinat, M.D.

## 2011-01-18 NOTE — Op Note (Signed)
[name], [name]             ACCOUNT NO.:  [id number]   MEDICAL RECORD NO.:  [id number]          PATIENT TYPE:  AMB   LOCATION:  ENDO                         FACILITY:  [facility]   PHYSICIAN:  Clinton D. Maple Hudson, M.D. DATE OF BIRTH:  02/23/1966   DATE OF PROCEDURE:  10/31/2004  DATE OF DISCHARGE:                                 OPERATIVE REPORT   PROCEDURE PERFORMED:  Bronchoscopy.   HISTORY:  This is a 45 year old former smoker being evaluated for  hemoptysis.  She has history of a right upper lobe wedge resection in  November of 2003 for adenocarcinoma with 4 of 8 positive nodes.  She was  subsequently treated with Iressa.  A recent CT scan was negative.  She noted  streak hemoptysis with morning cough beginning in the last couple of weeks.  There has not been obvious respiratory infection, pain or acute process  otherwise.  Other medical problems have included asthma and a single  seizure.  Physical examination was significant for blood pressure 114/82,  weight 185 pounds, alert, well-developed, well-nourished comfortable-  appearing young woman.  No adenopathy or rash, tanned skin.  Nose and throat  clear.  Lung fields clear.  Heart sounds normal.  No enlargement of liver or  spleen.  She was considered stable for the planned procedure.   DESCRIPTION OF PROCEDURE:  After fully informed consent, bronchoscopy was  performed on an outpatient basis in the endoscopy suite.  Premedication with  Demerol and atropine.  The upper airway was anesthetized topically with  Cetacaine spray and Xylocaine.  A cumulative dose of 9 mg of intravenous  Versed was required for additional sedation and cough control providing  light but comfortable sedation.  Oxygen was provided at 8 L per minute to  hold saturation over 92%.  Cardiac monitor was normal.  A video bronchoscope  was introduced via the right nostril to the level of the vocal cord without  difficulty.  The cords moved normally and the  trachea and main carina were  unremarkable.  Bronchial mucosa looked normal with normal amounts of clear  mucinous secretion.  The left bronchial tree was examined to the fourth  division level with no endobronchial abnormality and no evidence of  bleeding.  The right bronchial tree was significant for closed narrowing  consistent with surgical closure of the right upper lobe orifice.  The right  middle lobe orifice was narrow.  The bronchoscope could be introduced and  the medial and lateral orifices were identified with no abnormality  recognized.  The lower lobe orifices were unremarkable.  There was no  evidence of bleeding or suggestion of a bleeding source.  Saline lavage was  done for washes.  Brushing was performed at the closed orifice of the right  upper lobe and in the right middle lobe.  A forceps was introduced for blind  bronchial mucosal samples within the right middle lobe.  There was trivial  self limited local bleeding from the biopsies only.  The scope was then  withdrawn with no complications.  She was immediately conversant and  appeared comfortable.   FINAL  IMPRESSION:  1.  No evidence of bleeding.  2.  Narrowing apparently by surgical closure of the right upper lobe      orifice.  3.  Nonspecific narrowing of the right middle lobe orifice.   PLAN:  She will be held until stable, then released home with family to  office follow-up.      CDY/MEDQ  D:  10/31/2004  T:  10/31/2004  Job:  [id number]   cc:   Valentino Hue. Magrinat, M.D.  [address] [facility]  [place]  [place] [address]  Fax: [phone]   Ralene Ok, M.D.  [address]  [place]  [place] [address]  Fax: [phone]

## 2011-01-21 ENCOUNTER — Telehealth: Payer: Self-pay | Admitting: Internal Medicine

## 2011-01-21 MED ORDER — HYDROCODONE-HOMATROPINE 5-1.5 MG/5ML PO SYRP: 5.0000 mL | ORAL_SOLUTION | Freq: Four times a day (QID) | ORAL | Status: AC | PRN

## 2011-01-21 MED ORDER — CLARITHROMYCIN 500 MG PO TABS: 500.0000 mg | ORAL_TABLET | Freq: Two times a day (BID) | ORAL | Status: AC

## 2011-01-21 NOTE — Telephone Encounter (Signed)
[id number] 

## 2011-01-21 NOTE — Telephone Encounter (Signed)
Per CY-okay to give Biaxin 500 mg #14 take 1 po bid no refills and Hydromet #200 ml 1 tsp every 6 hours prn cough no refills. Pt aware that Rx's called to pharmacy.

## 2011-01-21 NOTE — Telephone Encounter (Signed)
Spoke with pt and she c/o productive cough with thick green phlegm, hoarseness, chest congestion x 3 days. Pt denies fever. Some increased SOB and chest tightness as well all x 3 days.  She has tried some OTC cough syrup and mucinex without relief.  She states the cough is worse at night and is requesting an abx and cough syrup. Please advise.Carron Curie, CMA No Known Allergies

## 2011-01-21 NOTE — Progress Notes (Signed)
Quick Note:  Spoke with patient-aware of results. ______ 

## 2011-02-12 ENCOUNTER — Ambulatory Visit (INDEPENDENT_AMBULATORY_CARE_PROVIDER_SITE_OTHER): Payer: Medicare Other | Admitting: Internal Medicine

## 2011-02-12 ENCOUNTER — Encounter: Payer: Self-pay | Admitting: Internal Medicine

## 2011-02-12 VITALS — BP 90/62 | HR 83 | Ht 65.0 in | Wt 124.8 lb

## 2011-02-12 DIAGNOSIS — J84111 Idiopathic interstitial pneumonia, not otherwise specified: Secondary | ICD-10-CM

## 2011-02-12 DIAGNOSIS — J42 Unspecified chronic bronchitis: Secondary | ICD-10-CM

## 2011-02-12 MED ORDER — MOMETASONE FUROATE 220 MCG/INH IN AEPB: 1.0000 | INHALATION_SPRAY | Freq: Two times a day (BID) | RESPIRATORY_TRACT | Status: DC

## 2011-02-12 MED ORDER — LEVALBUTEROL TARTRATE 45 MCG/ACT IN AERO: 2.0000 | INHALATION_SPRAY | Freq: Four times a day (QID) | RESPIRATORY_TRACT | Status: DC | PRN

## 2011-02-12 MED ORDER — LEVALBUTEROL HCL 0.63 MG/3ML IN NEBU: 1.0000 | INHALATION_SOLUTION | RESPIRATORY_TRACT | Status: DC | PRN

## 2011-02-12 MED ORDER — FORMOTEROL FUMARATE 12 MCG IN CAPS: 12.0000 ug | ORAL_CAPSULE | Freq: Two times a day (BID) | RESPIRATORY_TRACT | Status: DC

## 2011-02-12 NOTE — Progress Notes (Signed)
Subjective:    Patient ID: [name], female    DOB: 05/04/1966, 45 y.o.   MRN: [id number]  HPI    Review of Systems     Objective:   Physical Exam        Assessment & Plan:   Subjective:    Patient ID: [name], female    DOB: [birth year], 45 y.o.   MRN: [id number]  HPI 45 yo former heavy smoker followed after RUL lobectomy for lung cancer, chronic bronchitis, allergic rhinitis, recent diffuse interstitial process. Dr Darnelle Catalan recently saw her for Oncology f/u and repeated CT 11/09/10 - images reviewed. There has been a decrease in the numerous cavitating nodular infiltrates since November comparison. This improved with no treatment. She has seen Dr Edwyna Shell today.  She has been sunning herself, lying outside and blames seasonal pollen for what she considers normal for season mild chest congestion with scant light yellow phlegm. Denies chest pain, fever, night sweats, nodes. Recent PPD skin test was negative. HIV serology was negative. She had chosen to defer bronchoscopy discussed at last visit, because she felt well despite CXR.  02/12/11-  Feels ok. Had a chest cold a couple of weeks ago- largely resolved. Otherwise has felt well. Boyfriend still smokes heavily around her-  makes her cough. Otherwise not noting routine cough, chest pain, palpitation, fever.  Review of Systems See HPI Constitutional:   No weight loss, night sweats,  Fevers, chills, fatigue, lassitude. HEENT:   No headaches,  Difficulty swallowing,  Tooth/dental problems,  Sore throat,                No sneezing, itching, ear ache, nasal congestion, post nasal drip,         Snore is waking her up.   CV:  No chest pain,  Orthopnea, PND, swelling in lower extremities, anasarca, dizziness, palpitations  GI  No heartburn, indigestion, abdominal pain, nausea, vomiting, diarrhea, change in bowel habits, loss of appetite  Resp: No acute shortness of breath with exertion or at rest.  No excess mucus,  minimal productive cough,  No non-productive cough,  No coughing up of blood.  No change in color of mucus.  No wheezing.    Skin: no rash or lesions.--often sunburned  GU: no dysuria, change in color of urine, no urgency or frequency.  No flank pain.  MS:  No joint pain or swelling.  No decreased range of motion.  No back pain.  Psych:  No change in mood or affect. No depression or anxiety.  No memory loss.      Objective:   Physical Exam General- Alert, Oriented, Affect-appropriate, Distress- none acute  Skin- suntanned/ burned  Lymphadenopathy- small rubbery anterior cervical nodes  Head- atraumatic  Eyes- Gross vision intact, PERRLA, conjunctivae clear, secretions  Ears- Hearing- normal-canals, Tm- normal  Nose- Clear,  No- Septal dev, mucus, polyps, erosion, perforation   Throat- Mallampati II , mucosa clear , drainage- none, tonsils- atrophic  Neck- flexible , trachea midline, no stridor , thyroid nl, carotid no bruit  Chest - symmetrical excursion , unlabored     Heart/CV- RRR , no murmur , no gallop  , no rub, nl s1 s2                     - JVD- none , edema- none, stasis changes- none, varices- none     Lung- clear to P&A, wheeze- none, cough- none , dullness-none, rub- none.  Chest wall- Abd- tender-no, distended-no, bowel sounds-present, HSM- no  Br/ Gen/ Rectal- Not done, not indicated  Extrem- cyanosis- none, clubbing, none, atrophy- none, strength- nl  Neuro- grossly intact to observation         Assessment & Plan:

## 2011-02-12 NOTE — Assessment & Plan Note (Signed)
Nodular pattern had resolved at last imaging. Rather than expose to any more radiation than necessary, absent symptoms, I will wait on ordering more studies now

## 2011-02-12 NOTE — Patient Instructions (Signed)
Scripts refilled- please call as needed

## 2011-02-16 ENCOUNTER — Encounter: Payer: Self-pay | Admitting: Internal Medicine

## 2011-02-16 NOTE — Assessment & Plan Note (Signed)
This is not currently symptomatic. I expressed concern about her ongoing smoke exposure.

## 2011-03-12 NOTE — Assessment & Plan Note (Signed)
OFFICE VISIT  ROSELLE, NORTON DOB:  1966/02/26                                        December 11, 2010 CHART #:  [id number]  The patient returns today.  Her blood pressure is 106/72, pulse 100, respirations 18 and sats were 99%.  Her CT scan done in March showed decrease in these nodules in the right middle lobe and on the left side and multiple tiny cavitary nodules, so this seems to be resolving infection.  Because of this, we will continue to follow her and see her back again in 6 months with another chest x-ray.  We may need to repeat her CT scan at that time.  She will be seeing Dr. Maple Hudson today.  Ines Bloomer, M.D. Electronically Signed  DPB/MEDQ  D:  12/11/2010  T:  12/12/2010  Job:  [id number]  cc:   Joni Fears D. Maple Hudson, MD, FCCP, FACP Lowella Dell, M.D.

## 2011-04-09 ENCOUNTER — Inpatient Hospital Stay (INDEPENDENT_AMBULATORY_CARE_PROVIDER_SITE_OTHER)
Admission: RE | Admit: 2011-04-09 | Discharge: 2011-04-09 | Disposition: A | Discharge status: Home / Self Care | Payer: Medicare Other | Source: Ambulatory Visit | Attending: Emergency Medicine | Admitting: Emergency Medicine

## 2011-04-09 DIAGNOSIS — L089 Local infection of the skin and subcutaneous tissue, unspecified: Secondary | ICD-10-CM

## 2011-04-24 ENCOUNTER — Encounter: Payer: Self-pay | Admitting: Internal Medicine

## 2011-04-30 ENCOUNTER — Encounter: Payer: Self-pay | Admitting: Family Medicine

## 2011-04-30 ENCOUNTER — Ambulatory Visit (INDEPENDENT_AMBULATORY_CARE_PROVIDER_SITE_OTHER): Payer: PRIVATE HEALTH INSURANCE | Admitting: Family Medicine

## 2011-04-30 DIAGNOSIS — F411 Generalized anxiety disorder: Secondary | ICD-10-CM

## 2011-04-30 MED ORDER — TOPIRAMATE 50 MG PO TABS: ORAL_TABLET | ORAL | Status: DC

## 2011-04-30 MED ORDER — ALPRAZOLAM 0.5 MG PO TABS: 0.5000 mg | ORAL_TABLET | ORAL | Status: DC

## 2011-04-30 NOTE — Patient Instructions (Addendum)
You may schedule a follow up appointment with me in 3 to 4 months.

## 2011-05-04 NOTE — Progress Notes (Signed)
  Subjective:    Patient ID: [name], female    DOB: 1965/11/12, 45 y.o.   MRN: [id number]  HPI  Subjective:     [name] is a 45 y.o. female who presents for follow up of anxiety disorder. Current symptoms: none. She denies current suicidal and homicidal ideation. She complains of the following side effects from the treatment: none.  The following portions of the patient's history were reviewed and updated as appropriate: allergies, current medications and past medical history.    Objective:    BP 114/81  Pulse 103  Temp(Src) 99 F (37.2 C) (Oral)  Ht 5\' 5"  (1.651 m)  Wt 124 lb 6.4 oz (56.427 kg)  BMI 20.70 kg/m2  General:  alert, cooperative and no distress  Affect/Behavior:  full facial expressions, good grooming, good insight, normal perception and normal reasoning flight of ideas and pressured speech  Skin: small, round, indurated lesion under L axillary; no abscess; no pus or drainage  Assessment:    Anxiety Disorder - unchanged, stable    Plan:    Instructed patient to contact office or on-call physician promptly should condition worsen or any new symptoms appear and provided on-call telephone numbers. IF THE PATIENT HAS ANY SUICIDAL OR HOMICIDAL IDEATIONS, CALL THE OFFICE, DISCUSS WITH A SUPPORT MEMBER, OR GO TO THE ER IMMEDIATELY. Patient was agreeable with this plan. Follow up: 3 months.    Review of Systems     Objective:   Physical Exam        Assessment & Plan:

## 2011-05-04 NOTE — Assessment & Plan Note (Signed)
Anxiety symptoms currently controlled on Xanax 0.5 one and half tab TID. No recent panic attacks; denies any suicidal or homicidal ideations. Patient has no desire to change medications or start SSRI at this time. May consider referral to Dr. Pascal Lux to help co-manage anxiety and benzodiazepine abuse. Will continue current regimen. Follow up in 3-4 months.

## 2011-05-15 ENCOUNTER — Ambulatory Visit (INDEPENDENT_AMBULATORY_CARE_PROVIDER_SITE_OTHER): Payer: PRIVATE HEALTH INSURANCE | Admitting: Family Medicine

## 2011-05-15 VITALS — BP 115/72 | Ht 65.0 in | Wt 122.0 lb

## 2011-05-15 DIAGNOSIS — M7500 Adhesive capsulitis of unspecified shoulder: Secondary | ICD-10-CM

## 2011-05-15 MED ORDER — MELOXICAM 15 MG PO TABS: 15.0000 mg | ORAL_TABLET | Freq: Every day | ORAL | Status: DC

## 2011-05-15 NOTE — Progress Notes (Signed)
  Subjective:    Patient ID: [name], female    DOB: Apr 19, 1966, 45 y.o.   MRN: [id number]  HPI 45 y/o female is here c/o right shoulder pain x4 months that she woke up with. She think she heard a pop when she was getting out of bed.  Since that morning the pain in her posterior lateral shoulder has increased, especially with reaching.  She also feels more stiff.   Review of Systems     Objective:   Physical Exam  Shoulder: Inspection reveals no abnormalities, atrophy or asymmetry. Palpation is normal with no tenderness over AC joint or bicipital groove. Active ROM: 0-45 external rotation, 0-90 abduction, 0-150 forward flexion Passive ROM extends abduction to 100 degrees Rotator cuff strength decreased throughout. Impingement testing difficult to generalized pain. Speeds and Yergason's tests normal.  Consent obtained and verified. Sterile betadine prep. Furthur cleansed with alcohol. Topical analgesic spray: Ethyl chloride. Joint: shoulder Approached in typical fashion with: Completed without difficulty Meds:lidcaine without epinephrine 3ml: depo medrol 40mg  Needle:25 g Aftercare instructions and Red flags advised.         Assessment & Plan:

## 2011-05-15 NOTE — Patient Instructions (Signed)
1. You have adhesive capsulitis.  2.  You will get a call for your physical therapy appointment.  3. Follow up with me in 3-4 weeks.

## 2011-05-22 DIAGNOSIS — M7500 Adhesive capsulitis of unspecified shoulder: Secondary | ICD-10-CM | POA: Insufficient documentation

## 2011-05-22 HISTORY — DX: Adhesive capsulitis of unspecified shoulder: M75.00

## 2011-05-22 MED ORDER — METHYLPREDNISOLONE ACETATE 40 MG/ML IJ SUSP: 40.0000 mg | Freq: Once | INTRAMUSCULAR | Status: DC

## 2011-05-22 NOTE — Assessment & Plan Note (Addendum)
Steroid injection today relieved symptoms markedly.  Pt demonstrated full ROM prior to leaving the clinic.  She was instructed to rest the arm (except for ROM) for 2 days post injection.  She will start physical therapy for a few visits and then do rotator cuff exercises at home.  Follow up in 3-4 weeks.  Also started mobic.

## 2011-05-24 LAB — COMPREHENSIVE METABOLIC PANEL
AST: 20
Albumin: 4
Alkaline Phosphatase: 82
BUN: 8
Chloride: 104
GFR calc Af Amer: >60
Potassium: 4
Total Bilirubin: 1.2
Total Protein: 7.8

## 2011-05-24 LAB — CBC
Platelets: 255
RDW: 13.5
WBC: 7.3

## 2011-05-24 LAB — APTT: aPTT: 30

## 2011-05-24 LAB — PROTIME-INR: INR: 1

## 2011-06-04 ENCOUNTER — Other Ambulatory Visit: Payer: Self-pay | Admitting: Thoracic Surgery

## 2011-06-04 DIAGNOSIS — C3490 Malignant neoplasm of unspecified part of unspecified bronchus or lung: Secondary | ICD-10-CM

## 2011-06-05 ENCOUNTER — Ambulatory Visit: Payer: PRIVATE HEALTH INSURANCE | Attending: Family Medicine

## 2011-06-05 DIAGNOSIS — IMO0001 Reserved for inherently not codable concepts without codable children: Secondary | ICD-10-CM | POA: Insufficient documentation

## 2011-06-05 DIAGNOSIS — M25619 Stiffness of unspecified shoulder, not elsewhere classified: Secondary | ICD-10-CM | POA: Insufficient documentation

## 2011-06-05 DIAGNOSIS — M25519 Pain in unspecified shoulder: Secondary | ICD-10-CM | POA: Insufficient documentation

## 2011-06-10 ENCOUNTER — Other Ambulatory Visit: Payer: Self-pay | Admitting: Thoracic Surgery

## 2011-06-10 DIAGNOSIS — C3490 Malignant neoplasm of unspecified part of unspecified bronchus or lung: Secondary | ICD-10-CM

## 2011-06-12 ENCOUNTER — Encounter: Payer: Self-pay | Admitting: Thoracic Surgery

## 2011-06-12 ENCOUNTER — Ambulatory Visit: Payer: PRIVATE HEALTH INSURANCE | Admitting: Thoracic Surgery

## 2011-06-12 ENCOUNTER — Ambulatory Visit: Payer: Self-pay | Admitting: Thoracic Surgery

## 2011-06-12 ENCOUNTER — Ambulatory Visit (INDEPENDENT_AMBULATORY_CARE_PROVIDER_SITE_OTHER): Payer: PRIVATE HEALTH INSURANCE | Admitting: Thoracic Surgery

## 2011-06-12 ENCOUNTER — Ambulatory Visit: Payer: PRIVATE HEALTH INSURANCE | Admitting: Rehabilitation

## 2011-06-12 ENCOUNTER — Ambulatory Visit (HOSPITAL_COMMUNITY)
Admission: RE | Admit: 2011-06-12 | Discharge: 2011-06-12 | Disposition: A | Discharge status: Home / Self Care | Payer: PRIVATE HEALTH INSURANCE | Source: Ambulatory Visit | Attending: Thoracic Surgery | Admitting: Thoracic Surgery

## 2011-06-12 VITALS — BP 98/67 | HR 88 | Resp 20 | Ht 65.0 in | Wt 126.0 lb

## 2011-06-12 DIAGNOSIS — R918 Other nonspecific abnormal finding of lung field: Secondary | ICD-10-CM

## 2011-06-12 DIAGNOSIS — C3490 Malignant neoplasm of unspecified part of unspecified bronchus or lung: Secondary | ICD-10-CM | POA: Insufficient documentation

## 2011-06-12 NOTE — Progress Notes (Signed)
HPI the patient returns today for followup. She is alert 5 years since her last cancer. She developed a bilateral nodular infiltrates. This was felt to be inflammatory. Chest x-ray today shows that these nodules infiltrates are stable or slightly decreased. I will follow up with Dr. Maple Hudson and will see her again if she has any future problems.   Current Outpatient Prescriptions  Medication Sig Dispense Refill  . ALPRAZolam (XANAX) 0.5 MG tablet Take 1 tablet (0.5 mg total) by mouth as directed. one and one half tabs by mouth three times a day as needed anxiety  135 tablet  5  . formoterol (FORADIL) 12 MCG capsule for inhaler Place 1 capsule (12 mcg total) into inhaler and inhale 2 (two) times daily.  60 capsule  prn  . levalbuterol (XOPENEX HFA) 45 MCG/ACT inhaler Inhale 2 puffs into the lungs every 6 (six) hours as needed for wheezing or shortness of breath.  1 Inhaler  prn  . levalbuterol (XOPENEX) 0.63 MG/3ML nebulizer solution Take 3 mLs (0.63 mg total) by nebulization every 4 (four) hours as needed for wheezing or shortness of breath.  75 mL  prn  . meloxicam (MOBIC) 15 MG tablet Take 1 tablet (15 mg total) by mouth daily.  30 tablet  2  . mometasone (ASMANEX 60 METERED DOSES) 220 MCG/INH inhaler Inhale 1 puff into the lungs 2 (two) times daily.  1 Inhaler  prn  . naproxen sodium (ANAPROX) 220 MG tablet Take 220 mg by mouth as needed.        . topiramate (TOPAMAX) 50 MG tablet take 1 and 1/2 tablets by mouth BID  50 tablet  3  . zolpidem (AMBIEN) 10 MG tablet Take 10 mg by mouth at bedtime as needed.         Current Facility-Administered Medications  Medication Dose Route Frequency Provider Last Rate Last Dose  . methylPREDNISolone acetate (DEPO-MEDROL) injection 40 mg  40 mg Intra-articular Once [employer]         Review of Systems: No change   Physical Exam  Cardiovascular: Normal rate, regular rhythm and normal heart sounds.   Pulmonary/Chest: Effort normal and breath sounds  normal. No respiratory distress.     Diagnostic Tests: Chest x-ray stable   Impression: Status post lung cancer x2 chronic obstructive pulmonary disease and inflammatory lesion bilateral lungs   Plan: Followup Dr. Fannie Knee

## 2011-06-14 ENCOUNTER — Ambulatory Visit: Payer: Medicare Other | Admitting: Internal Medicine

## 2011-06-18 ENCOUNTER — Ambulatory Visit: Payer: PRIVATE HEALTH INSURANCE

## 2011-06-19 ENCOUNTER — Ambulatory Visit: Payer: PRIVATE HEALTH INSURANCE | Admitting: Family Medicine

## 2011-06-20 ENCOUNTER — Ambulatory Visit: Payer: PRIVATE HEALTH INSURANCE

## 2011-06-25 ENCOUNTER — Encounter: Payer: Self-pay | Admitting: Internal Medicine

## 2011-06-25 ENCOUNTER — Ambulatory Visit (INDEPENDENT_AMBULATORY_CARE_PROVIDER_SITE_OTHER): Payer: PRIVATE HEALTH INSURANCE | Admitting: Internal Medicine

## 2011-06-25 VITALS — BP 96/70 | HR 107 | Ht 65.0 in | Wt 128.0 lb

## 2011-06-25 DIAGNOSIS — J42 Unspecified chronic bronchitis: Secondary | ICD-10-CM

## 2011-06-25 DIAGNOSIS — J84111 Idiopathic interstitial pneumonia, not otherwise specified: Secondary | ICD-10-CM

## 2011-06-25 DIAGNOSIS — C3490 Malignant neoplasm of unspecified part of unspecified bronchus or lung: Secondary | ICD-10-CM

## 2011-06-25 NOTE — Progress Notes (Signed)
Patient ID: [name], female    DOB: [birth year], 45 y.o.   MRN: [id number]  HPI 45 yo former heavy smoker followed after RUL lobectomy for lung cancer, chronic bronchitis, allergic rhinitis, recent diffuse interstitial process. Dr Darnelle Catalan recently saw her for Oncology f/u and repeated CT 11/09/10 - images reviewed. There has been a decrease in the numerous cavitating nodular infiltrates since November comparison. This improved with no treatment. She has seen Dr Edwyna Shell today.  She has been sunning herself, lying outside and blames seasonal pollen for what she considers normal for season mild chest congestion with scant light yellow phlegm. Denies chest pain, fever, night sweats, nodes. Recent PPD skin test was negative. HIV serology was negative. She had chosen to defer bronchoscopy discussed at last visit, because she felt well despite CXR.  02/12/11-  Feels ok. Had a chest cold a couple of weeks ago- largely resolved. Otherwise has felt well. Boyfriend still smokes heavily around her-  makes her cough. Otherwise not noting routine cough, chest pain, palpitation, fever.  06/25/11-45 yo former heavy smoker followed after RUL lobectomy for lung cancer, chronic bronchitis, allergic rhinitis, recent diffuse interstitial process. Cooler air of the fall season and humid air in the shower both cause her chest to tighten. Dr. Edwyna Shell has now released her from his long-term followup. She is coughing and wheezing very little and feels stable. She is still exposed to her boyfriend's cigarette smoke. CXR-06/12/2011-stable post therapy appearance of the chest with nothing new. There is a right hilar/paratracheal mass with surgical clips unchanged from 2010, radiation changes. The widespread pulmonary interstitial opacities which developed over a year ago, are stable and consistent now with scarring.  Review of Systems See HPI Constitutional:   No-   weight loss, night sweats, fevers, chills, fatigue,  lassitude. HEENT:   No-  headaches, difficulty swallowing, tooth/dental problems, sore throat,       No-  sneezing, itching, ear ache, nasal congestion, post nasal drip,  CV:  No-   chest pain, orthopnea, PND, swelling in lower extremities, anasarca, dizziness, palpitations Resp: No-   shortness of breath with exertion or at rest.              No-   productive cough,  little non-productive cough,  No- coughing up of blood.              No-   change in color of mucus.  Little- wheezing.   Skin: No-   rash or lesions. GI:  No-   heartburn, indigestion, abdominal pain, nausea, vomiting, diarrhea,                 change in bowel habits, loss of appetite GU: No-   dysuria, change in color of urine, no urgency or frequency.  No- flank pain. MS:  No-   joint pain or swelling.  No- decreased range of motion.  No- back pain. Neuro-     nothing unusual Psych:  No- change in mood or affect. No depression or anxiety.  No memory loss.      Objective:   Physical Exam General- Alert, Oriented, Affect-appropriate, Distress- none acute Skin- rash-none, lesions- none, excoriation- none. Always has a somewhat sunburned look. Lymphadenopathy- none Head- atraumatic            Eyes- Gross vision intact, PERRLA, conjunctivae clear secretions            Ears- Hearing, canals-normal  Nose- Clear, no-Septal dev, mucus, polyps, erosion, perforation             Throat- Mallampati II , mucosa clear , drainage- none, tonsils- atrophic Neck- flexible , trachea midline, no stridor , thyroid nl, carotid no bruit Chest - symmetrical excursion , unlabored           Heart/CV- RRR , no murmur , no gallop  , no rub, nl s1 s2                           - JVD- none , edema- none, stasis changes- none, varices- none           Lung- clear to P&A, wheeze- none, cough- none , dullness-none, rub- none           Chest wall-  Abd- tender-no, distended-no, bowel sounds-present, HSM- no Br/ Gen/ Rectal- Not done, not  indicated Extrem- cyanosis- none, clubbing, none, atrophy- none, strength- nl Neuro- grossly intact to observation

## 2011-06-25 NOTE — Patient Instructions (Signed)
Flu vax  Please call as needed  Remember it can help in  cold air if you wear a scarf over your nose and mouth.

## 2011-06-26 ENCOUNTER — Ambulatory Visit: Payer: PRIVATE HEALTH INSURANCE | Admitting: Rehabilitation

## 2011-06-27 ENCOUNTER — Encounter: Payer: PRIVATE HEALTH INSURANCE | Admitting: Rehabilitation

## 2011-06-29 NOTE — Assessment & Plan Note (Signed)
She seems comfortable. She no longer smokes herself and I encouraged her to avoid exposure to her boyfriend's smoking as much as possible.

## 2011-06-29 NOTE — Assessment & Plan Note (Signed)
Remains in long-term remission after initial surgery/radiation/chemotherapy.

## 2011-06-29 NOTE — Assessment & Plan Note (Signed)
Stable x-ray picture as of October 2012. This process is now consistent with scarring although etiology was never completely established.

## 2011-07-01 ENCOUNTER — Ambulatory Visit: Payer: PRIVATE HEALTH INSURANCE | Admitting: Rehabilitation

## 2011-07-03 ENCOUNTER — Ambulatory Visit: Payer: PRIVATE HEALTH INSURANCE | Admitting: Rehabilitation

## 2011-07-09 ENCOUNTER — Other Ambulatory Visit (HOSPITAL_BASED_OUTPATIENT_CLINIC_OR_DEPARTMENT_OTHER): Payer: PRIVATE HEALTH INSURANCE

## 2011-07-09 ENCOUNTER — Other Ambulatory Visit: Payer: Self-pay | Admitting: Oncology

## 2011-07-09 ENCOUNTER — Ambulatory Visit (HOSPITAL_COMMUNITY)
Admission: RE | Admit: 2011-07-09 | Discharge: 2011-07-09 | Disposition: A | Discharge status: Home / Self Care | Payer: PRIVATE HEALTH INSURANCE | Source: Ambulatory Visit | Attending: Oncology | Admitting: Oncology

## 2011-07-09 ENCOUNTER — Ambulatory Visit: Payer: PRIVATE HEALTH INSURANCE | Attending: Family Medicine | Admitting: Rehabilitation

## 2011-07-09 ENCOUNTER — Encounter: Payer: PRIVATE HEALTH INSURANCE | Admitting: Rehabilitation

## 2011-07-09 DIAGNOSIS — R079 Chest pain, unspecified: Secondary | ICD-10-CM | POA: Insufficient documentation

## 2011-07-09 DIAGNOSIS — M25519 Pain in unspecified shoulder: Secondary | ICD-10-CM | POA: Insufficient documentation

## 2011-07-09 DIAGNOSIS — J984 Other disorders of lung: Secondary | ICD-10-CM | POA: Insufficient documentation

## 2011-07-09 DIAGNOSIS — C3490 Malignant neoplasm of unspecified part of unspecified bronchus or lung: Secondary | ICD-10-CM

## 2011-07-09 DIAGNOSIS — IMO0001 Reserved for inherently not codable concepts without codable children: Secondary | ICD-10-CM | POA: Insufficient documentation

## 2011-07-09 DIAGNOSIS — R05 Cough: Secondary | ICD-10-CM | POA: Insufficient documentation

## 2011-07-09 DIAGNOSIS — M25619 Stiffness of unspecified shoulder, not elsewhere classified: Secondary | ICD-10-CM | POA: Insufficient documentation

## 2011-07-09 DIAGNOSIS — R0602 Shortness of breath: Secondary | ICD-10-CM | POA: Insufficient documentation

## 2011-07-09 DIAGNOSIS — R059 Cough, unspecified: Secondary | ICD-10-CM | POA: Insufficient documentation

## 2011-07-09 DIAGNOSIS — Z923 Personal history of irradiation: Secondary | ICD-10-CM | POA: Insufficient documentation

## 2011-07-09 LAB — CBC WITH DIFFERENTIAL/PLATELET
Basophils Absolute: 0 10*3/uL (ref 0.0–0.1)
Eosinophils Absolute: 0.1 10*3/uL (ref 0.0–0.5)
HCT: 43.6 % (ref 34.8–46.6)
HGB: 15.2 g/dL (ref 11.6–15.9)
MCH: 32.7 pg (ref 25.1–34.0)
MCV: 93.4 fL (ref 79.5–101.0)
NEUT#: 6.1 10*3/uL (ref 1.5–6.5)
NEUT%: 76.9 % — ABNORMAL HIGH (ref 38.4–76.8)
RDW: 13.9 % (ref 11.2–14.5)
lymph#: 1.2 10*3/uL (ref 0.9–3.3)

## 2011-07-09 LAB — CMP (CANCER CENTER ONLY)
Albumin: 3.6 g/dL (ref 3.3–5.5)
BUN, Bld: 11 mg/dL (ref 7–22)
Calcium: 8.9 mg/dL (ref 8.0–10.3)
Chloride: 100 mEq/L (ref 98–108)
Creat: 0.9 mg/dl (ref 0.6–1.2)
Glucose, Bld: 96 mg/dL (ref 73–118)
Potassium: 3.5 mEq/L (ref 3.3–4.7)

## 2011-07-09 MED ORDER — IOHEXOL 300 MG/ML  SOLN
80.0000 mL | Freq: Once | INTRAMUSCULAR | Status: AC | PRN
  Administered 2011-07-09: 80 mL via INTRAVENOUS

## 2011-07-10 ENCOUNTER — Ambulatory Visit (INDEPENDENT_AMBULATORY_CARE_PROVIDER_SITE_OTHER): Payer: PRIVATE HEALTH INSURANCE | Admitting: Family Medicine

## 2011-07-10 ENCOUNTER — Encounter: Payer: Self-pay | Admitting: Family Medicine

## 2011-07-10 DIAGNOSIS — M7500 Adhesive capsulitis of unspecified shoulder: Secondary | ICD-10-CM

## 2011-07-11 ENCOUNTER — Ambulatory Visit: Payer: PRIVATE HEALTH INSURANCE | Admitting: Rehabilitation

## 2011-07-11 NOTE — Progress Notes (Signed)
  Subjective:    Patient ID: [name], female    DOB: 1966-03-16, 45 y.o.   MRN: [id number]  HPI   45 y/o female is here to follow up for adhesive capsulitis.  She was treated with a cortisone injection and formal physical therapy.  She is no longer symptomatic.  She is still in physical therapy.   Review of Systems     Objective:   Physical Exam  Right Shoulder: Full ROM Non-tender No deformity Normal strength      Assessment & Plan:

## 2011-07-11 NOTE — Assessment & Plan Note (Signed)
Symptoms have resolved.  She can get a home program at her next formal physical therapy appointment.  She can do the home ROM exercises PRN shoulder stiffness.

## 2011-07-15 ENCOUNTER — Encounter: Payer: PRIVATE HEALTH INSURANCE | Admitting: Rehabilitation

## 2011-07-16 ENCOUNTER — Encounter: Payer: PRIVATE HEALTH INSURANCE | Admitting: Rehabilitation

## 2011-07-16 ENCOUNTER — Ambulatory Visit (HOSPITAL_BASED_OUTPATIENT_CLINIC_OR_DEPARTMENT_OTHER): Payer: PRIVATE HEALTH INSURANCE

## 2011-07-16 ENCOUNTER — Telehealth: Payer: Self-pay | Admitting: Oncology

## 2011-07-16 ENCOUNTER — Ambulatory Visit (HOSPITAL_BASED_OUTPATIENT_CLINIC_OR_DEPARTMENT_OTHER): Payer: PRIVATE HEALTH INSURANCE | Admitting: Oncology

## 2011-07-16 VITALS — BP 118/81 | HR 89 | Temp 97.7°F | Ht 65.0 in | Wt 127.4 lb

## 2011-07-16 DIAGNOSIS — Z113 Encounter for screening for infections with a predominantly sexual mode of transmission: Secondary | ICD-10-CM

## 2011-07-16 DIAGNOSIS — R918 Other nonspecific abnormal finding of lung field: Secondary | ICD-10-CM

## 2011-07-16 DIAGNOSIS — C3490 Malignant neoplasm of unspecified part of unspecified bronchus or lung: Secondary | ICD-10-CM

## 2011-07-16 DIAGNOSIS — C3410 Malignant neoplasm of upper lobe, unspecified bronchus or lung: Secondary | ICD-10-CM

## 2011-07-16 NOTE — Progress Notes (Signed)
ID: [employer]   Interval History: The patient returns for followup of her lung cancer since the last visit here her daughter has gotten married and had a child and Mrs. April 2012). Both of the patient's daughter and the patient's daughter's husband is as well as the grandchild are living with [place].   In addition the patient is concerned because she tells me her boyfriend has been "running around" and she would like me to screen her for STD.  ROS: She is having no vaginal discharge or difficulty as she does have a cough productive of clear phlegm this really has not changed much except she says this time of year and her cough did to get a little bit worse and she tends to feel a little bit more short of breath and more tired there has been no green or yellow or bloody phlegm there has been no pleurisy she is not aware of any fevers or rash. She's having no vaginal discharge or other symptoms suggestive of chlamydia or gonorrhea. She's not aware of any adenopathy or source, and otherwise a detailed review of systems was noncontributory  Medications: I have reviewed the patient's current medications.   Current Outpatient Prescriptions  Medication Sig Dispense Refill  . ALPRAZolam (XANAX) 0.5 MG tablet Take 1 tablet (0.5 mg total) by mouth as directed. one and one half tabs by mouth three times a day as needed anxiety  135 tablet  5  . formoterol (FORADIL) 12 MCG capsule for inhaler Place 1 capsule (12 mcg total) into inhaler and inhale 2 (two) times daily.  60 capsule  prn  . levalbuterol (XOPENEX HFA) 45 MCG/ACT inhaler Inhale 2 puffs into the lungs every 6 (six) hours as needed for wheezing or shortness of breath.  1 Inhaler  prn  . levalbuterol (XOPENEX) 0.63 MG/3ML nebulizer solution Take 3 mLs (0.63 mg total) by nebulization every 4 (four) hours as needed for wheezing or shortness of breath.  75 mL  prn  . meloxicam (MOBIC) 15 MG tablet Take 1 tablet (15 mg total) by mouth daily.  30  tablet  2  . mometasone (ASMANEX 60 METERED DOSES) 220 MCG/INH inhaler Inhale 1 puff into the lungs 2 (two) times daily.  1 Inhaler  prn  . naproxen sodium (ANAPROX) 220 MG tablet Take 220 mg by mouth as needed.        . topiramate (TOPAMAX) 50 MG tablet take 1 and 1/2 tablets by mouth BID  50 tablet  3  . zolpidem (AMBIEN) 10 MG tablet Take 10 mg by mouth at bedtime as needed.           Objective: Vital signs in last 24 hours: BP 118/81  Pulse 89  Temp 97.7 F (36.5 C)  Ht 5\' 5"  (1.651 m)  Wt 127 lb 6.4 oz (57.788 kg)  BMI 21.20 kg/m2   Physical Exam:    Sclerae unicteric  Oropharynx clear  No peripheral adenopathy  Lungs clear -- no rales or rhonchi  Heart regular rate and rhythm  Abdomen benign  MSK no focal spinal tenderness, no peripheral edema  Neuro nonfocal    Lab Results:   BMET    Component Value Date/Time   NA 148* 07/09/2011 1413   NA 138 10/30/2010 1104   K 3.5 07/09/2011 1413   K 3.5 10/30/2010 1104   CL 100 07/09/2011 1413   CL 105 10/30/2010 1104   CO2 26 07/09/2011 1413   CO2 23 10/30/2010 1104  GLUCOSE 96 07/09/2011 1413   GLUCOSE 70 10/30/2010 1104   BUN 11 07/09/2011 1413   BUN 8 10/30/2010 1104   CREATININE 0.9 07/09/2011 1413   CREATININE 0.83 10/30/2010 1104   CALCIUM 8.9 07/09/2011 1413   CALCIUM 8.6 10/30/2010 1104   GFRNONAA >60 10/16/2007 1135   GFRAA  Value: >60        The eGFR has been calculated using the MDRD equation. This calculation has not been validated in all clinical 10/16/2007 1135     CMP     Component Value Date/Time   NA 148* 07/09/2011 1413   NA 138 10/30/2010 1104   K 3.5 07/09/2011 1413   K 3.5 10/30/2010 1104   CL 100 07/09/2011 1413   CL 105 10/30/2010 1104   CO2 26 07/09/2011 1413   CO2 23 10/30/2010 1104   GLUCOSE 96 07/09/2011 1413   GLUCOSE 70 10/30/2010 1104   BUN 11 07/09/2011 1413   BUN 8 10/30/2010 1104   CREATININE 0.9 07/09/2011 1413   CREATININE 0.83 10/30/2010 1104   CALCIUM 8.9 07/09/2011 1413   CALCIUM 8.6  10/30/2010 1104   PROT 7.9 07/09/2011 1413   PROT 7.2 10/30/2010 1104   ALBUMIN 3.8 10/30/2010 1104   AST 17 07/09/2011 1413   AST 15 10/30/2010 1104   ALT 11 10/30/2010 1104   ALKPHOS 94* 07/09/2011 1413   ALKPHOS 78 10/30/2010 1104   BILITOT 1.00 07/09/2011 1413   BILITOT 0.6 10/30/2010 1104   GFRNONAA >60 10/16/2007 1135   GFRAA  Value: >60        The eGFR has been calculated using the MDRD equation. This calculation has not been validated in all clinical 10/16/2007 1135    CBC    Component Value Date/Time   WBC 8.4 09/18/2010 1052   RBC 4.26 09/18/2010 1052   HGB 15.2 07/09/2011 1413   HGB 14.0 09/18/2010 1052   HCT 43.6 07/09/2011 1413   HCT 39.7 09/18/2010 1052   PLT 260 07/09/2011 1413   PLT 250.0 09/18/2010 1052   MCV 93.4 07/09/2011 1413   MCV 93.1 09/18/2010 1052   MCHC 35.3 09/18/2010 1052   RDW 13.6 09/18/2010 1052   LYMPHSABS 1.0 09/18/2010 1052   MONOABS 0.6 09/18/2010 1052   EOSABS 0.1 07/09/2011 1413   EOSABS 0.1 09/18/2010 1052   BASOSABS 0.0 07/09/2011 1413   BASOSABS 0.0 09/18/2010 1052        Studies/Results: CT scan of the chest is essentially stable she has multiple small cavitary lesions which appear to be resolving infection  Assessment: 45 year old [place] woman status post right upper lobe wedge resection in November of 2003 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 She received adjuvant IRESSA for 5 years. A second problem is bilateral infiltrates suggestive of MAI, with negative cultures late December of 2011 and stable scans   Plan: I have set her up for multiple a lab tests today for STD. In the absence of any vaginal discharge we are not doing smears but if she has any symptoms she will let me know. She will also call within 2 weeks to get the results of the lab tests obtained today.  Otherwise I plan to see her again in one year that will likely be the last visit here we will do a CT scan as well as lab work prior  to that visit  MAGRINAT,GUSTAV C 07/16/2011

## 2011-07-16 NOTE — Telephone Encounter (Signed)
Gv pt appt for nov2013.  scheduled pt for ct scan on 07/09/2012 @ 10:30am @ [facility].

## 2011-07-17 ENCOUNTER — Encounter: Payer: Self-pay | Admitting: Oncology

## 2011-07-17 LAB — HIV ANTIBODY (ROUTINE TESTING W REFLEX): HIV: NONREACTIVE

## 2011-07-17 LAB — HEPATITIS B SURFACE ANTIGEN: Hepatitis B Surface Ag: NEGATIVE

## 2011-07-17 LAB — RPR

## 2011-07-17 LAB — HEPATITIS B CORE ANTIBODY, IGM: Hep B C IgM: NEGATIVE

## 2011-10-28 ENCOUNTER — Ambulatory Visit (INDEPENDENT_AMBULATORY_CARE_PROVIDER_SITE_OTHER): Payer: PRIVATE HEALTH INSURANCE | Admitting: Family Medicine

## 2011-10-28 ENCOUNTER — Encounter: Payer: Self-pay | Admitting: Family Medicine

## 2011-10-28 DIAGNOSIS — F411 Generalized anxiety disorder: Secondary | ICD-10-CM

## 2011-10-28 DIAGNOSIS — J069 Acute upper respiratory infection, unspecified: Secondary | ICD-10-CM | POA: Insufficient documentation

## 2011-10-28 MED ORDER — GUAIFENESIN-CODEINE 100-10 MG/5ML PO SYRP: 5.0000 mL | ORAL_SOLUTION | Freq: Three times a day (TID) | ORAL | Status: AC | PRN

## 2011-10-28 MED ORDER — TOPIRAMATE 50 MG PO TABS: ORAL_TABLET | ORAL | Status: DC

## 2011-10-28 MED ORDER — FLUTICASONE PROPIONATE 50 MCG/ACT NA SUSP: 2.0000 | Freq: Every day | NASAL | Status: DC

## 2011-10-28 MED ORDER — ALPRAZOLAM 0.5 MG PO TABS: 0.5000 mg | ORAL_TABLET | ORAL | Status: DC

## 2011-10-28 NOTE — Patient Instructions (Signed)
Please pick up prescriptions and take as directed. You may also purchase over the counter Dayquil or Tylenol for cold/sinus. Schedule follow up appointment with me as needed. It was good to see you today, [name].   Upper Respiratory Infection, Adult An upper respiratory infection (URI) is also known as the common cold. It is often caused by a type of germ (virus). Colds are easily spread (contagious). You can pass it to others by kissing, coughing, sneezing, or drinking out of the same glass. Usually, you get better in 1 or 2 weeks.  HOME CARE   Only take medicine as told by your doctor.   Use a warm mist humidifier or breathe in steam from a hot shower.   Drink enough water and fluids to keep your pee (urine) clear or pale yellow.   Get plenty of rest.   Return to work when your temperature is back to normal or as told by your doctor. You may use a face mask and wash your hands to stop your cold from spreading.  GET HELP RIGHT AWAY IF:   After the first few days, you feel you are getting worse.   You have questions about your medicine.   You have chills, shortness of breath, or brown or red spit (mucus).   You have yellow or brown snot (nasal discharge) or pain in the face, especially when you bend forward.   You have a fever, puffy (swollen) neck, pain when you swallow, or white spots in the back of your throat.   You have a bad headache, ear pain, sinus pain, or chest pain.   You have a high-pitched whistling sound when you breathe in and out (wheezing).   You have a lasting cough or cough up blood.   You have sore muscles or a stiff neck.  MAKE SURE YOU:   Understand these instructions.   Will watch your condition.   Will get help right away if you are not doing well or get worse.  Document Released: 02/05/2008 Document Revised: 05/01/2011 Document Reviewed: 12/24/2010 [facility] Patient Information 2012 [place], [place].

## 2011-10-28 NOTE — Assessment & Plan Note (Signed)
Prescriptions for Flonase and Robitussin a.c. were given to patient. Handout given for home instructions and red flags for prompt return to clinic.

## 2011-10-28 NOTE — Progress Notes (Signed)
  Subjective:    Patient ID: [name], female    DOB: 1965-09-09, 46 y.o.   MRN: [id number]  HPI Patient presents to clinic for medication refills and cold symptoms.  Cold symptoms: Patient complains of nasal congestion, rhinorrhea, productive cough. Symptoms started about one week ago. She is a former smoker. She denies any fever, nausea, vomiting, chest pain, shortness of breath. She does cough up yellow sputum. She does complain of chills. Patient is asking for a cough syrup that was prescribed to her in the past by her pulmonologist. She has tried NyQuil which does relieve her symptoms but does help her sleep at night. She denies any decreased appetite or any other symptoms.  Anxiety: Patient is here for refill of Xanax. She takes 1-1/2 tablets 3 times a day as needed for anxiety.  Patient says that she has not needed to take a Xanax every day.  Denies any recent panic attacks. Denies any homicidal or suicidal thoughts. Again, she is not interested in changing her regimen despite the risks of dependence and addiction.  I have reviewed patient's allergies, medications, problem list, social history.  Review of Systems  Per history of present illness    Objective:   Physical Exam  Constitutional: No distress.  HENT:  Head: Normocephalic and atraumatic.  Right Ear: Tympanic membrane normal.  Left Ear: Tympanic membrane normal.  Nose: Rhinorrhea present. No mucosal edema.  Mouth/Throat: Oropharynx is clear and moist.  Eyes: Conjunctivae are normal. Pupils are equal, round, and reactive to light.  Neck: Normal range of motion. Neck supple.  Cardiovascular: Normal rate and normal heart sounds.   Pulmonary/Chest: Effort normal and breath sounds normal. She has no wheezes. She has no rales.          Assessment & Plan:

## 2011-10-28 NOTE — Assessment & Plan Note (Signed)
Continue Xanax regimen.  Discussed the risks of long-term benzodiazepines today. Patient is not interested in changing regimen.  Advised patient to take benzodiazepines only when needed and to avoid taking them every day.

## 2011-12-24 ENCOUNTER — Encounter: Payer: Self-pay | Admitting: Internal Medicine

## 2011-12-24 ENCOUNTER — Ambulatory Visit (INDEPENDENT_AMBULATORY_CARE_PROVIDER_SITE_OTHER): Payer: Medicare Other | Admitting: Internal Medicine

## 2011-12-24 VITALS — BP 82/64 | HR 99 | Ht 65.0 in | Wt 129.2 lb

## 2011-12-24 DIAGNOSIS — J84111 Idiopathic interstitial pneumonia, not otherwise specified: Secondary | ICD-10-CM

## 2011-12-24 DIAGNOSIS — J309 Allergic rhinitis, unspecified: Secondary | ICD-10-CM

## 2011-12-24 DIAGNOSIS — J42 Unspecified chronic bronchitis: Secondary | ICD-10-CM

## 2011-12-24 MED ORDER — FORMOTEROL FUMARATE 12 MCG IN CAPS: 12.0000 ug | ORAL_CAPSULE | Freq: Two times a day (BID) | RESPIRATORY_TRACT | Status: DC

## 2011-12-24 MED ORDER — LEVALBUTEROL HCL 0.63 MG/3ML IN NEBU: 1.0000 | INHALATION_SOLUTION | RESPIRATORY_TRACT | Status: DC | PRN

## 2011-12-24 MED ORDER — LEVALBUTEROL TARTRATE 45 MCG/ACT IN AERO: 2.0000 | INHALATION_SPRAY | Freq: Four times a day (QID) | RESPIRATORY_TRACT | Status: DC | PRN

## 2011-12-24 MED ORDER — MOMETASONE FUROATE 220 MCG/INH IN AEPB: 1.0000 | INHALATION_SPRAY | Freq: Two times a day (BID) | RESPIRATORY_TRACT | Status: DC

## 2011-12-24 NOTE — Patient Instructions (Signed)
Meds refilled  Ok to use saline nasal spray or gel for dry nose Ok to use Mucinex if needed to help clear thick sticky mucus so it will come out more easily. Ok to use antihistamines like claritin/ loratadine if needed for allergy, sneeze, itch, drainage

## 2011-12-24 NOTE — Progress Notes (Signed)
Patient ID: [name], female    DOB: [birth year], 46 y.o.   MRN: [id number]  HPI 46 yo former heavy smoker followed after RUL lobectomy for lung cancer, chronic bronchitis, allergic rhinitis, recent diffuse interstitial process. Dr Darnelle Catalan recently saw her for Oncology f/u and repeated CT 11/09/10 - images reviewed. There has been a decrease in the numerous cavitating nodular infiltrates since November comparison. This improved with no treatment. She has seen Dr Edwyna Shell today.  She has been sunning herself, lying outside and blames seasonal pollen for what she considers normal for season mild chest congestion with scant light yellow phlegm. Denies chest pain, fever, night sweats, nodes. Recent PPD skin test was negative. HIV serology was negative. She had chosen to defer bronchoscopy discussed at last visit, because she felt well despite CXR.  02/12/11-  Feels ok. Had a chest cold a couple of weeks ago- largely resolved. Otherwise has felt well. Boyfriend still smokes heavily around her-  makes her cough. Otherwise not noting routine cough, chest pain, palpitation, fever.  06/25/11-46 yo former heavy smoker followed after RUL lobectomy for lung cancer, chronic bronchitis, allergic rhinitis, recent diffuse interstitial process. Cooler air of the fall season and humid air in the shower both cause her chest to tighten. Dr. Edwyna Shell has now released her from his long-term followup. She is coughing and wheezing very little and feels stable. She is still exposed to her boyfriend's cigarette smoke. CXR-06/12/2011-stable post therapy appearance of the chest with nothing new. There is a right hilar/paratracheal mass with surgical clips unchanged from 2010, radiation changes. The widespread pulmonary interstitial opacities which developed over a year ago, are stable and consistent now with scarring.  12/24/11- 46 yo former heavy smoker followed after RUL lobectomy for lung cancer, chronic bronchitis, allergic  rhinitis, recent diffuse interstitial process. Blames pollen for recent tighter chest, mild wheeze, nasal congestion and drainage. Denies fever, pain or blood. Discussed medication refill needs. Educated the difference between Mucinex and antihistamines. Chest x-ray 06/12/2011-reviewed-stable.  Review of Systems-See HPI Constitutional:   No-   weight loss, night sweats, fevers, chills, fatigue, lassitude. HEENT:   No-  headaches, difficulty swallowing, tooth/dental problems, sore throat,       No-  sneezing, itching, ear ache, nasal congestion, post nasal drip,  CV:  No-   chest pain, orthopnea, PND, swelling in lower extremities, anasarca, dizziness, palpitations Resp: No-   shortness of breath with exertion or at rest.              No-   productive cough,  little non-productive cough,  No- coughing up of blood.              No-   change in color of mucus.  Little- wheezing.   Skin: No-   rash or lesions. GI:  No-   heartburn, indigestion, abdominal pain, nausea, vomiting,  GU: No-   dysuria,  MS:  No-   joint pain or swelling.  No- decreased range of motion.  No- back pain. Neuro-     nothing unusual Psych:  No- change in mood or affect. No depression or anxiety.  No memory loss.      Objective:   Physical Exam General- Alert, Oriented, Affect-appropriate, Distress- none acute Skin- rash-none, lesions- none, excoriation- none. Always has a somewhat sunburned look. Lymphadenopathy- none Head- atraumatic            Eyes- Gross vision intact, PERRLA, conjunctivae clear secretions  Ears- Hearing, canals-normal            Nose- Clear, no-Septal dev, mucus, polyps, erosion, perforation             Throat- Mallampati II , mucosa clear , drainage- none, tonsils- atrophic Neck- flexible , trachea midline, no stridor , thyroid nl, carotid no bruit Chest - symmetrical excursion , unlabored           Heart/CV- RRR , no murmur , no gallop  , no rub, nl s1 s2                            - JVD- none , edema- none, stasis changes- none, varices- none           Lung- clear to P&A, wheeze- none, cough- none , dullness-none, rub- none           Chest wall-  Abd- Br/ Gen/ Rectal- Not done, not indicated Extrem- cyanosis- none, clubbing, none, atrophy- none, strength- nl Neuro- grossly intact to observation

## 2011-12-27 NOTE — Assessment & Plan Note (Signed)
Mild seasonal exacerbation. She can use OTC antihistamines.

## 2011-12-27 NOTE — Assessment & Plan Note (Signed)
Some seasonal exacerbation of asthmatic bronchitis. Her medications should be sufficient. Refills are updated.

## 2012-05-12 ENCOUNTER — Encounter: Payer: Self-pay | Admitting: Family Medicine

## 2012-05-12 ENCOUNTER — Ambulatory Visit (INDEPENDENT_AMBULATORY_CARE_PROVIDER_SITE_OTHER): Payer: Medicare Other | Admitting: Family Medicine

## 2012-05-12 VITALS — BP 90/64 | HR 68 | Temp 98.0°F | Ht 65.0 in | Wt 130.5 lb

## 2012-05-12 DIAGNOSIS — Z23 Encounter for immunization: Secondary | ICD-10-CM

## 2012-05-12 DIAGNOSIS — F411 Generalized anxiety disorder: Secondary | ICD-10-CM

## 2012-05-12 MED ORDER — CITALOPRAM HYDROBROMIDE 20 MG PO TABS: 20.0000 mg | ORAL_TABLET | Freq: Every day | ORAL | Status: DC

## 2012-05-12 MED ORDER — ALPRAZOLAM 0.5 MG PO TABS: 0.5000 mg | ORAL_TABLET | ORAL | Status: DC

## 2012-05-12 NOTE — Assessment & Plan Note (Signed)
Patient has been taking Xanax every day, which is new for patient. She is not interested in stopping Xanax, but is willing to try a SSRI in addition. Will start patient on Celexa 20 mg daily and advised patient to take Xanax only as needed for severe anxiety or panic attacks. Counseled patient on risks of taking long-term benzodiazepines.

## 2012-05-12 NOTE — Patient Instructions (Addendum)
It was nice to see you today. Start taking Celexa 20 mg once a day for anxiety. Schedule followup appointment with me in 6-8 weeks. Have a great week!

## 2012-05-12 NOTE — Progress Notes (Signed)
  Subjective:    Patient ID: [name], female    DOB: 11-12-1965, 46 y.o.   MRN: [id number]  HPI  Patient presents to clinic for medication refills. Patient has a history of panic attacks and general anxiety disorder. She currently takes Xanax every day. Some days that she takes it twice a day and other days she takes it 3 times a day. Patient does not want to stop taking benzodiazepines, because she says it is only thing that has worked. Patient denies any recent panic attacks, but she has been feeling stressed out ever since her daughter and granddaughter have moved into her house.  Patient denies any homicidal or suicidal thoughts. Denies any side effects of Xanax.  Review of Systems  Per history of present illness    Objective:   Physical Exam  Constitutional: No distress.  Psychiatric: Her behavior is normal. Thought content normal. Her mood appears anxious. Her speech is rapid and/or pressured. She expresses no homicidal and no suicidal ideation.      Assessment & Plan:

## 2012-06-23 ENCOUNTER — Encounter: Payer: Self-pay | Admitting: Internal Medicine

## 2012-06-23 ENCOUNTER — Ambulatory Visit (INDEPENDENT_AMBULATORY_CARE_PROVIDER_SITE_OTHER): Payer: Medicare Other | Admitting: Internal Medicine

## 2012-06-23 VITALS — BP 98/60 | HR 98 | Ht 65.0 in | Wt 135.0 lb

## 2012-06-23 DIAGNOSIS — J42 Unspecified chronic bronchitis: Secondary | ICD-10-CM

## 2012-06-23 MED ORDER — FORMOTEROL FUMARATE 12 MCG IN CAPS: 12.0000 ug | ORAL_CAPSULE | Freq: Two times a day (BID) | RESPIRATORY_TRACT | Status: DC

## 2012-06-23 MED ORDER — LEVALBUTEROL TARTRATE 45 MCG/ACT IN AERO: 2.0000 | INHALATION_SPRAY | Freq: Four times a day (QID) | RESPIRATORY_TRACT | Status: DC | PRN

## 2012-06-23 MED ORDER — LEVALBUTEROL HCL 0.63 MG/3ML IN NEBU: 1.0000 | INHALATION_SOLUTION | RESPIRATORY_TRACT | Status: DC | PRN

## 2012-06-23 MED ORDER — MOMETASONE FUROATE 220 MCG/INH IN AEPB: 1.0000 | INHALATION_SPRAY | Freq: Two times a day (BID) | RESPIRATORY_TRACT | Status: DC

## 2012-06-23 NOTE — Progress Notes (Signed)
Patient ID: [name], female    DOB: [birth year], 46 y.o.   MRN: [id number]  HPI 46 yo former heavy smoker followed after RUL lobectomy for lung cancer, chronic bronchitis, allergic rhinitis, recent diffuse interstitial process. Dr Darnelle Catalan recently saw her for Oncology f/u and repeated CT 11/09/10 - images reviewed. There has been a decrease in the numerous cavitating nodular infiltrates since November comparison. This improved with no treatment. She has seen Dr Edwyna Shell today.  She has been sunning herself, lying outside and blames seasonal pollen for what she considers normal for season mild chest congestion with scant light yellow phlegm. Denies chest pain, fever, night sweats, nodes. Recent PPD skin test was negative. HIV serology was negative. She had chosen to defer bronchoscopy discussed at last visit, because she felt well despite CXR.  02/12/11-  Feels ok. Had a chest cold a couple of weeks ago- largely resolved. Otherwise has felt well. Boyfriend still smokes heavily around her-  makes her cough. Otherwise not noting routine cough, chest pain, palpitation, fever.  06/25/11-46 yo former heavy smoker followed after RUL lobectomy for lung cancer, chronic bronchitis, allergic rhinitis, recent diffuse interstitial process. Cooler air of the fall season and humid air in the shower both cause her chest to tighten. Dr. Edwyna Shell has now released her from his long-term followup. She is coughing and wheezing very little and feels stable. She is still exposed to her boyfriend's cigarette smoke. CXR-06/12/2011-stable post therapy appearance of the chest with nothing new. There is a right hilar/paratracheal mass with surgical clips unchanged from 2010, radiation changes. The widespread pulmonary interstitial opacities which developed over a year ago, are stable and consistent now with scarring.  12/24/11- 46 yo former heavy smoker followed after RUL lobectomy for lung cancer, chronic bronchitis, allergic  rhinitis, recent diffuse interstitial process. Blames pollen for recent tighter chest, mild wheeze, nasal congestion and drainage. Denies fever, pain or blood. Discussed medication refill needs. Educated the difference between Mucinex and antihistamines. Chest x-ray 06/12/2011-reviewed-stable.  06/23/12- 46 yo former heavy smoker followed after RUL lobectomy for lung cancer, chronic bronchitis, allergic rhinitis, recent diffuse interstitial process.  Had flu vaccine Pt c/o increased coughing while showering, pt thinks d/t heat and steam. Pt notices this with severe weather changes as well.  Patient needs printed Rx 30 day supply/refills for Foradil, Xopenex HFA and Neb and Asmanex. Boyfriend still smokes but they got rid of the wood stove. Xopenex has been a big help. Her oncologist will get a followup CT scan but there has been no recurrence of her lung cancer.  Review of Systems-See HPI Constitutional:   No-   weight loss, night sweats, fevers, chills, fatigue, lassitude. HEENT:   No-  headaches, difficulty swallowing, tooth/dental problems, sore throat,       No-  sneezing, itching, ear ache, nasal congestion, post nasal drip,  CV:  No-   chest pain, orthopnea, PND, swelling in lower extremities, anasarca, dizziness, palpitations Resp: No- acute  shortness of breath with exertion or at rest.              No-   productive cough,  little non-productive cough,  No- coughing up of blood.              No-   change in color of mucus.  Little- wheezing.   Skin: No-   rash or lesions. GI:  No-   heartburn, indigestion, abdominal pain, nausea, vomiting,  GU:   MS:  No-   joint  pain or swelling.  No- decreased range of motion.  No- back pain. Neuro-     nothing unusual Psych:  No- change in mood or affect. No depression or anxiety.  No memory loss.   Objective:   Physical Exam General- Alert, Oriented, Affect-appropriate, Distress- none acute Skin- rash-none, lesions- none, excoriation- none.  Always has a somewhat sunburned look. Lymphadenopathy- none Head- atraumatic            Eyes- Gross vision intact, PERRLA, conjunctivae clear secretions            Ears- Hearing, canals-normal            Nose- Clear, no-Septal dev, mucus, polyps, erosion, perforation             Throat- Mallampati II , mucosa clear , drainage- none, tonsils- atrophic Neck- flexible , trachea midline, no stridor , thyroid nl, carotid no bruit Chest - symmetrical excursion , unlabored           Heart/CV- RRR , no murmur , no gallop  , no rub, nl s1 s2                           - JVD- none , edema- none, stasis changes- none, varices- none           Lung- clear to P&A, wheeze- none, cough- none , dullness-none, rub- none           Chest wall-  Abd- Br/ Gen/ Rectal- Not done, not indicated Extrem- cyanosis- none, clubbing, none, atrophy- none, strength- nl Neuro- grossly intact to observation

## 2012-06-23 NOTE — Patient Instructions (Addendum)
Call your [facility] doctor and explain the problems you are having with Celexa  Try using your rescue inhaler before you shower. See if that helps.   Scripts to refill your breathing medicines

## 2012-06-24 ENCOUNTER — Ambulatory Visit: Payer: Medicare Other | Admitting: Internal Medicine

## 2012-06-25 ENCOUNTER — Other Ambulatory Visit: Payer: Self-pay | Admitting: *Deleted

## 2012-06-25 DIAGNOSIS — C3490 Malignant neoplasm of unspecified part of unspecified bronchus or lung: Secondary | ICD-10-CM

## 2012-07-04 NOTE — Assessment & Plan Note (Signed)
Better control with less airway inflammation. Plan-try using rescue inhaler before showering or other changes in air quality and humidity that affect breathing.

## 2012-07-06 ENCOUNTER — Other Ambulatory Visit: Payer: Self-pay | Admitting: *Deleted

## 2012-07-06 MED ORDER — TOPIRAMATE 50 MG PO TABS: ORAL_TABLET | ORAL | Status: DC

## 2012-07-06 NOTE — Addendum Note (Signed)
Addended by: Damita Lack on: 07/06/2012 10:54 AM   Modules accepted: Orders

## 2012-07-09 ENCOUNTER — Other Ambulatory Visit (HOSPITAL_BASED_OUTPATIENT_CLINIC_OR_DEPARTMENT_OTHER): Payer: Medicare Other | Admitting: Lab

## 2012-07-09 ENCOUNTER — Other Ambulatory Visit: Payer: Self-pay | Admitting: Oncology

## 2012-07-09 ENCOUNTER — Ambulatory Visit (HOSPITAL_COMMUNITY)
Admission: RE | Admit: 2012-07-09 | Discharge: 2012-07-09 | Disposition: A | Discharge status: Home / Self Care | Payer: PRIVATE HEALTH INSURANCE | Source: Ambulatory Visit | Attending: Oncology | Admitting: Oncology

## 2012-07-09 DIAGNOSIS — R0602 Shortness of breath: Secondary | ICD-10-CM | POA: Insufficient documentation

## 2012-07-09 DIAGNOSIS — C3490 Malignant neoplasm of unspecified part of unspecified bronchus or lung: Secondary | ICD-10-CM

## 2012-07-09 DIAGNOSIS — R059 Cough, unspecified: Secondary | ICD-10-CM | POA: Insufficient documentation

## 2012-07-09 DIAGNOSIS — R05 Cough: Secondary | ICD-10-CM | POA: Insufficient documentation

## 2012-07-09 LAB — CBC WITH DIFFERENTIAL/PLATELET
BASO%: 0.6 % (ref 0.0–2.0)
Basophils Absolute: 0 10*3/uL (ref 0.0–0.1)
EOS%: 2.2 % (ref 0.0–7.0)
Eosinophils Absolute: 0.1 10*3/uL (ref 0.0–0.5)
HCT: 37.2 % (ref 34.8–46.6)
HGB: 13.2 g/dL (ref 11.6–15.9)
LYMPH%: 18 % (ref 14.0–49.7)
MCH: 31.6 pg (ref 25.1–34.0)
MCHC: 35.5 g/dL (ref 31.5–36.0)
MCV: 89 fL (ref 79.5–101.0)
MONO#: 0.4 10*3/uL (ref 0.1–0.9)
MONO%: 6.3 % (ref 0.0–14.0)
NEUT#: 4.6 10*3/uL (ref 1.5–6.5)
NEUT%: 72.9 % (ref 38.4–76.8)
Platelets: 222 10*3/uL (ref 145–400)
RBC: 4.18 10*6/uL (ref 3.70–5.45)
RDW: 12.8 % (ref 11.2–14.5)
WBC: 6.3 10*3/uL (ref 3.9–10.3)
lymph#: 1.1 10*3/uL (ref 0.9–3.3)
nRBC: 0 % (ref 0–0)

## 2012-07-09 LAB — COMPREHENSIVE METABOLIC PANEL (CC13)
ALT: 17 U/L (ref 0–55)
AST: 18 U/L (ref 5–34)
Albumin: 3.4 g/dL — ABNORMAL LOW (ref 3.5–5.0)
Alkaline Phosphatase: 62 U/L (ref 40–150)
BUN: 13 mg/dL (ref 7.0–26.0)
CO2: 25 mEq/L (ref 22–29)
Calcium: 8.9 mg/dL (ref 8.4–10.4)
Chloride: 111 mEq/L — ABNORMAL HIGH (ref 98–107)
Creatinine: 0.8 mg/dL (ref 0.6–1.1)
Glucose: 80 mg/dl (ref 70–99)
Potassium: 3.7 mEq/L (ref 3.5–5.1)
Sodium: 139 mEq/L (ref 136–145)
Total Bilirubin: 0.52 mg/dL (ref 0.20–1.20)
Total Protein: 7.2 g/dL (ref 6.4–8.3)

## 2012-07-09 MED ORDER — IOHEXOL 300 MG/ML  SOLN
80.0000 mL | Freq: Once | INTRAMUSCULAR | Status: AC | PRN
  Administered 2012-07-09: 80 mL via INTRAVENOUS

## 2012-07-16 ENCOUNTER — Ambulatory Visit (HOSPITAL_BASED_OUTPATIENT_CLINIC_OR_DEPARTMENT_OTHER): Payer: Medicare Other | Admitting: Lab

## 2012-07-16 ENCOUNTER — Ambulatory Visit (HOSPITAL_BASED_OUTPATIENT_CLINIC_OR_DEPARTMENT_OTHER): Payer: Medicare Other | Admitting: Oncology

## 2012-07-16 VITALS — BP 109/75 | HR 99 | Temp 97.3°F | Resp 20 | Ht 65.0 in | Wt 134.5 lb

## 2012-07-16 DIAGNOSIS — R300 Dysuria: Secondary | ICD-10-CM

## 2012-07-16 DIAGNOSIS — C3410 Malignant neoplasm of upper lobe, unspecified bronchus or lung: Secondary | ICD-10-CM

## 2012-07-16 LAB — URINALYSIS, MICROSCOPIC - CHCC
Bilirubin (Urine): NEGATIVE
Ketones: NEGATIVE mg/dL
Protein: NEGATIVE mg/dL
Specific Gravity, Urine: 1.005 (ref 1.003–1.035)

## 2012-07-16 MED ORDER — PHENAZOPYRIDINE HCL 100 MG PO TABS: 100.0000 mg | ORAL_TABLET | Freq: Three times a day (TID) | ORAL | Status: DC | PRN

## 2012-07-16 NOTE — Progress Notes (Signed)
ID: [name]   DOB: 1965/11/01  MR#: [id number]  CSN#:[id number]  PCP: Barnabas Lister, DO GYN: Lavina Hamman MD SU:  OTHER MD: Antony Blackbird   INTERVAL HISTORY: The patient returns today for followup of her remote lung cancer. Interval history is generally unremarkable. Her daughter is now 48 years old, married, with a child.  REVIEW OF SYSTEMS: Alnisa quit smoking  at the Time of Her Diagnosis, 10 Years Ago, but unfortunately her current boyfriend smokes in the house and she is exposed to the second hand smoke. She describes herself as mildly fatigued, with a sore throat, short of breath when walking particularly walking up stairs, and occasionally a cough productive of clear sputum. Currently she thinks she has a urinary tract infection because she has frequency and urgency, although no dysuria or hematuria. There has been no fever. A detailed review of systems was otherwise negative.   PAST MEDICAL HISTORY: Past Medical History  Diagnosis Date  . External hemorrhoids   . Allergic rhinitis   . Idiopathic interstitial pneumonia, not otherwise specified   . Chronic bronchitis   . History of lung cancer   . Panic attacks   . Anxiety disorder   . Dyspepsia   . Hypercholesteremia   . Insomnia   . Goiter   . Migraine, unspecified, without mention of intractable migraine without mention of status migrainosus   . Female stress incontinence   . Dizziness and giddiness   . Lung cancer     PAST SURGICAL HISTORY: Past Surgical History  Procedure Date  . Bronchoscopy 01/31/2005    nonspecific inflammation  . Spirometry 07/03/2002    min obstruction,mild restriction 01/31/2005  . Tvh 12/01/2000  . Portacath removed 10/2007  . Biopsy of right neck mass     negative  . Bronchoscopy 08/24/2010    nonspecific inflammation  . Lung removal, partial     for lung cancer--followed by Dr. Edwyna Shell and Dr. Maple Hudson    FAMILY HISTORY Family History  Problem Relation Age of Onset  . Lung  cancer Father   . Diabetes Maternal Grandfather     HEALTH MAINTENANCE: History  Substance Use Topics  . Smoking status: Former Smoker    Types: Cigarettes    Quit date: 10/26/2001  . Smokeless tobacco: Not on file     Comment: Significant passive exposure from boy friend  . Alcohol Use: No     Comment: history of alcohol abuse 20-30     Colonoscopy:  PAP:  Bone density:  Lipid panel:  No Known Allergies  Current Outpatient Prescriptions  Medication Sig Dispense Refill  . ALPRAZolam (XANAX) 0.5 MG tablet Take 1 tablet (0.5 mg total) by mouth as directed. one and one half tabs by mouth three times a day as needed anxiety  135 tablet  5  . citalopram (CELEXA) 20 MG tablet Take 1 tablet (20 mg total) by mouth daily.  30 tablet  1  . formoterol (FORADIL) 12 MCG capsule for inhaler Place 1 capsule (12 mcg total) into inhaler and inhale 2 (two) times daily.  60 capsule  prn  . levalbuterol (XOPENEX HFA) 45 MCG/ACT inhaler Inhale 2 puffs into the lungs every 6 (six) hours as needed for wheezing or shortness of breath.  1 Inhaler  prn  . levalbuterol (XOPENEX) 0.63 MG/3ML nebulizer solution Take 3 mLs (0.63 mg total) by nebulization every 4 (four) hours as needed for wheezing or shortness of breath.  75 mL  prn  . mometasone (ASMANEX  60 METERED DOSES) 220 MCG/INH inhaler Inhale 1 puff into the lungs 2 (two) times daily. Rinse mouth  1 Inhaler  prn  . topiramate (TOPAMAX) 50 MG tablet take 1 and 1/2 tablets by mouth BID  90 tablet  3    OBJECTIVE: Middle-aged white woman who appears well Filed Vitals:   07/16/12 1108  BP: 109/75  Pulse: 99  Temp: 97.3 F (36.3 [name])  Resp: 20     Body mass index is 22.38 kg/(m^2).    ECOG FS: 1  Sclerae unicteric Oropharynx clear No cervical or supraclavicular adenopathy Lungs no rales or rhonchi, good excursion bilaterally Heart regular rate and rhythm Abd benign MSK no focal spinal tenderness, no peripheral edema Neuro: nonfocal Breasts:  Deferred   LAB RESULTS: Value  Range   Glucose  Negative  Negative g/dL   Bilirubin (Urine)  Negative  Negative    Ketones  Negative  Negative mg/dL   Specific Gravity, Urine  1.005  1.003 - 1.035    Blood  Negative  Negative    pH  6.0  4.6 - 8.0    Protein  Negative  Negative- <30 mg/dL   Nitrite  Negative  Negative    Leukocyte Esterase  Trace  Negative    RBC / HPF  Negative  0 - 2    WBC, UA  0-2  0 - 2    Bacteria, UA  Trace  Negative- Trace    Epithelial Cells  Occasional  Negative- Few    Resulting Agency  [facility]     Result Narrative      Lab Results  Component Value Date   WBC 6.3 07/09/2012   NEUTROABS 4.6 07/09/2012   HGB 13.2 07/09/2012   HCT 37.2 07/09/2012   MCV 89.0 07/09/2012   PLT 222 07/09/2012      Chemistry      Component Value Date/Time   NA 139 07/09/2012 0958   NA 148* 07/09/2011 1413   NA 138 10/30/2010 1104   K 3.7 07/09/2012 0958   K 3.5 07/09/2011 1413   K 3.5 10/30/2010 1104   CL 111* 07/09/2012 0958   CL 100 07/09/2011 1413   CL 105 10/30/2010 1104   CO2 25 07/09/2012 0958   CO2 26 07/09/2011 1413   CO2 23 10/30/2010 1104   BUN 13.0 07/09/2012 0958   BUN 11 07/09/2011 1413   BUN 8 10/30/2010 1104   CREATININE 0.8 07/09/2012 0958   CREATININE 0.9 07/09/2011 1413   CREATININE 0.83 10/30/2010 1104      Component Value Date/Time   CALCIUM 8.9 07/09/2012 0958   CALCIUM 8.9 07/09/2011 1413   CALCIUM 8.6 10/30/2010 1104   ALKPHOS 62 07/09/2012 0958   ALKPHOS 94* 07/09/2011 1413   ALKPHOS 78 10/30/2010 1104   AST 18 07/09/2012 0958   AST 17 07/09/2011 1413   AST 15 10/30/2010 1104   ALT 17 07/09/2012 0958   ALT 11 10/30/2010 1104   BILITOT 0.52 07/09/2012 0958   BILITOT 1.00 07/09/2011 1413   BILITOT 0.6 10/30/2010 1104       Lab Results  Component Value Date   LABCA2 26 07/21/2007    No components found with this basename: [id number]    No results found for this basename: INR:1;PROTIME:1 in the last 168 hours  Urinalysis No results found for  this basename: colorurine, appearanceur, labspec, phurine, glucoseu, hgbur, bilirubinur, ketonesur, proteinur, urobilinogen, nitrite, leukocytesur    STUDIES: Ct Chest W Contrast  07/09/2012  *  RADIOLOGY REPORT*  Clinical Data: Follow-up lung cancer and lung disease.  Chronic cough and shortness of breath.  CT CHEST WITH CONTRAST  Technique:  Multidetector CT imaging of the chest was performed following the standard protocol during bolus administration of intravenous contrast.  Contrast: 80mL OMNIPAQUE IOHEXOL 300 MG/ML  SOLN, 80mL OMNIPAQUE IOHEXOL 300 MG/ML  SOLN  Comparison: Multiple prior chest CTs.  Findings: The chest wall is unremarkable and stable.  No obvious breast masses, supraclavicular or axillary lymphadenopathy. Bilateral thyroid nodules appears stable.  The bony thorax is intact.  No destructive bone lesions or spinal canal compromise.  The heart is normal in size.  No pericardial effusion.  No mediastinal or hilar lymphadenopathy.  Moderate mid esophageal wall thickening is likely due to radiation change.  There are surgical and radiation changes involving the right along with radiation fibrosis involving the right paramediastinal lung.  No CT findings to suggest recurrent tumor.  The aorta is normal in caliber.  No dissection.  Major branch vessels are patent.  Examination of the lung parenchyma demonstrates a persistent but definitely improved micronodular pattern with a small innumerable irregular cysts.  There is also interstitial thickening and architectural distortion in the lungs.  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  Suspect the patient has stopped smoking or on steroid therapy which would explain the improvement.  No focal airspace consolidation, pleural effusions or worrisome pulmonary mass/nodule.  The upper abdomen demonstrates stable splenomegaly.  IMPRESSION:  1.  Stable surgical and radiation changes involving the right lung. No  findings for recurrent tumor or pulmonary metastatic disease. 2.  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  Recommend clinical correlation.   Original Report Authenticated By: Rudie Meyer, M.D.     ASSESSMENT: 46 y.o. 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 She received adjuvant IRESSA for 5 years. A second problem is bilateral infiltrates suggestive of MAI, with negative cultures late December of 2011 and stable scans   PLAN: The patient is doing very well from a lung cancer point of view. We did obtain a urinalysis and this was not suggestive of an infection. She requested some Pyridium which I wrote (15 tablets, no refills). Culture is pending.  While her CT scan does not show evidence of recurrent cancer, it does show a diffuse process which is being followed by Dr. Maple Hudson. This seems to be somewhat improved as compared to prior.  Now, 10 years out from her original diagnosis, I am comfortable releasing her to her other physicians. We are making no more appointments for [facility] here, but she knows we will be glad to reevaluate her at any point in the future if the need arises.   [name]    07/16/2012

## 2012-07-17 ENCOUNTER — Telehealth: Payer: Self-pay | Admitting: Oncology

## 2012-07-17 DIAGNOSIS — N390 Urinary tract infection, site not specified: Secondary | ICD-10-CM

## 2012-07-17 MED ORDER — CIPROFLOXACIN HCL 500 MG PO TABS: 500.0000 mg | ORAL_TABLET | Freq: Two times a day (BID) | ORAL | Status: DC

## 2012-07-17 NOTE — Telephone Encounter (Signed)
Fax letter to [employer] office from Dr.Magrinat.

## 2012-07-17 NOTE — Telephone Encounter (Signed)
Urinary symptoms are worse, even with Pyridium. Reviewed with Zollie Scale, PA her symptoms and U/A results. Approved order for Cipro X 5 days. Patient notified of this and to push po fluids.

## 2012-07-17 NOTE — Telephone Encounter (Signed)
Letter sent to Barnabas Lister DO from Dr.Magrinat

## 2012-07-18 LAB — URINE CULTURE

## 2012-09-09 ENCOUNTER — Telehealth: Payer: Self-pay | Admitting: Internal Medicine

## 2012-09-09 MED ORDER — AZITHROMYCIN 250 MG PO TABS: 250.0000 mg | ORAL_TABLET | ORAL | Status: DC

## 2012-09-09 NOTE — Telephone Encounter (Signed)
I spoke with pt. She c/o cough w/ yellow phlem and chest congestion x 3 days. No wheezing, chest tx. She has been taking robitussin w/o relief. She is requesting further recs. Please advise Dr. Maple Hudson thanks Last OV 06/23/12 Pending OV 12/22/12 No Known Allergies

## 2012-09-09 NOTE — Telephone Encounter (Signed)
Rx was sent to pharm  Pt aware  

## 2012-09-09 NOTE — Telephone Encounter (Signed)
Per SN---ok to call in zpak #1  Take as directed and use mucinex dm.  thanks

## 2012-09-15 ENCOUNTER — Telehealth: Payer: Self-pay | Admitting: Internal Medicine

## 2012-09-15 MED ORDER — PROMETHAZINE-CODEINE 6.25-10 MG/5ML PO SYRP: 5.0000 mL | ORAL_SOLUTION | Freq: Four times a day (QID) | ORAL | Status: DC | PRN

## 2012-09-15 NOTE — Telephone Encounter (Signed)
Pt aware and rx sent. Danya Spearman, CMA  

## 2012-09-15 NOTE — Telephone Encounter (Signed)
Spoke with pt She is c/o lingering cough- dry mostly, but occ produces minimal yellow sputum We called in zpack for her on 09/10/12 and advised take mucinex dm She finished the zpack and has tried delsym and mucinex dm w/o relief Pt states that she can not sleep due to cough She states no aches, f/c/s, SOB, wheezing, CP, chest tightness Please advise, thanks! Last ov 06/23/12 Next ov 12/22/12 No Known Allergies

## 2012-09-15 NOTE — Telephone Encounter (Signed)
Per CY:  Call in Promethazine with Codeine 1 tsp every 6 hr prn cough.

## 2012-11-25 ENCOUNTER — Encounter: Payer: Self-pay | Admitting: Family Medicine

## 2012-11-25 ENCOUNTER — Ambulatory Visit (INDEPENDENT_AMBULATORY_CARE_PROVIDER_SITE_OTHER): Payer: PRIVATE HEALTH INSURANCE | Admitting: Family Medicine

## 2012-11-25 VITALS — BP 116/81 | HR 111 | Temp 98.7°F | Ht 65.0 in | Wt 135.0 lb

## 2012-11-25 DIAGNOSIS — J42 Unspecified chronic bronchitis: Secondary | ICD-10-CM

## 2012-11-25 DIAGNOSIS — J019 Acute sinusitis, unspecified: Secondary | ICD-10-CM

## 2012-11-25 DIAGNOSIS — J209 Acute bronchitis, unspecified: Secondary | ICD-10-CM

## 2012-11-25 MED ORDER — PROMETHAZINE-CODEINE 6.25-10 MG/5ML PO SYRP: 5.0000 mL | ORAL_SOLUTION | Freq: Four times a day (QID) | ORAL | Status: DC | PRN

## 2012-11-25 MED ORDER — AZITHROMYCIN 250 MG PO TABS: 250.0000 mg | ORAL_TABLET | ORAL | Status: DC

## 2012-11-25 NOTE — Progress Notes (Signed)
  Subjective:    Patient ID: Rea [employer], female    DOB: 06/27/1966, 47 y.o.   MRN: [id number]  HPI 47 y.o. female with COPD with cough off and on for 2 months - cough, congestion. Recent episode x 4 days. Fever off and on (101 on Sunday), chills, sweats. Started with sore throat. Then head/sinus congestion, wheezing. Taking mucinex, Had z-pack first episode. 09/10/12. Called to   History COPD and lung cancer s/p surgery (2003)   Review of Systems  Constitutional: Positive for fever, chills and diaphoresis.  HENT: Positive for congestion, sore throat and sinus pressure. Negative for ear pain and ear discharge.   Respiratory: Positive for cough, shortness of breath and wheezing.   Cardiovascular: Positive for chest pain.  Gastrointestinal: Negative for vomiting and diarrhea.  Genitourinary: Negative for dysuria.  Neurological: Positive for dizziness.       Objective:   Physical Exam  Constitutional: She is oriented to person, place, and time. She appears well-developed and well-nourished. No distress.  HENT:  Head: Normocephalic and atraumatic.  Right Ear: External ear normal.  Left Ear: External ear normal.  OP mildly erythematous, cobblestoned. Nasal turbinates swollen, red.   Eyes: Conjunctivae and EOM are normal. Pupils are equal, round, and reactive to light.  Neck: Normal range of motion. Neck supple.  Cardiovascular: Normal rate, regular rhythm and normal heart sounds.   Pulmonary/Chest: Effort normal and breath sounds normal. No respiratory distress. She has no wheezes. She has no rales.  Prolonged expiratory phase. Good air movement throughout.  Lymphadenopathy:    She has cervical adenopathy (small, anterior cervical left (<1cm)).  Neurological: She is alert and oriented to person, place, and time.  Skin: Skin is warm and dry.  Psychiatric: She has a normal mood and affect.    Filed Vitals:   11/25/12 0852  BP: 116/81  Pulse: 111  Temp: 98.7 F (37.1 C)     Assessment & Plan:  47 y.o. female with COPD with  - Acute URI - bronchitis/sinusitis - WIll treat with antibiotics given   Napoleon Form, MD

## 2012-11-25 NOTE — Assessment & Plan Note (Signed)
Acute URI with sinusitis/bronchitis, increased sputum production, yellow sputum. No wheezing Z-pack, phenergan-codeine, mucinex. F/U 1 week

## 2012-11-25 NOTE — Patient Instructions (Addendum)
Bronchitis  Bronchitis is the body's way of reacting to injury and/or infection (inflammation) of the bronchi. Bronchi are the air tubes that extend from the windpipe into the lungs. If the inflammation becomes severe, it may cause shortness of breath.  CAUSES   Inflammation may be caused by:   A virus.   Germs (bacteria).   Dust.   Allergens.   Pollutants and many other irritants.  The cells lining the bronchial tree are covered with tiny hairs (cilia). These constantly beat upward, away from the lungs, toward the mouth. This keeps the lungs free of pollutants. When these cells become too irritated and are unable to do their job, mucus begins to develop. This causes the characteristic cough of bronchitis. The cough clears the lungs when the cilia are unable to do their job. Without either of these protective mechanisms, the mucus would settle in the lungs. Then you would develop pneumonia.  Smoking is a common cause of bronchitis and can contribute to pneumonia. Stopping this habit is the single most important thing you can do to help yourself.  TREATMENT    Your caregiver may prescribe an antibiotic if the cough is caused by bacteria. Also, medicines that open up your airways make it easier to breathe. Your caregiver may also recommend or prescribe an expectorant. It will loosen the mucus to be coughed up. Only take over-the-counter or prescription medicines for pain, discomfort, or fever as directed by your caregiver.   Removing whatever causes the problem (smoking, for example) is critical to preventing the problem from getting worse.   Cough suppressants may be prescribed for relief of cough symptoms.   Inhaled medicines may be prescribed to help with symptoms now and to help prevent problems from returning.   For those with recurrent (chronic) bronchitis, there may be a need for steroid medicines.  SEEK IMMEDIATE MEDICAL CARE IF:    During treatment, you develop more pus-like mucus (purulent  sputum).   You have a fever.   Your baby is older than 3 months with a rectal temperature of 102 F (38.9 C) or higher.   Your baby is 3 months old or younger with a rectal temperature of 100.4 F (38 C) or higher.   You become progressively more ill.   You have increased difficulty breathing, wheezing, or shortness of breath.  It is necessary to seek immediate medical care if you are elderly or sick from any other disease.  MAKE SURE YOU:    Understand these instructions.   Will watch your condition.   Will get help right away if you are not doing well or get worse.  Document Released: 08/19/2005 Document Revised: 11/11/2011 Document Reviewed: 06/28/2008  ExitCare Patient Information 2013 [employer].    Sinusitis  Sinusitis is redness, soreness, and swelling (inflammation) of the paranasal sinuses. Paranasal sinuses are air pockets within the bones of your face (beneath the eyes, the middle of the forehead, or above the eyes). In healthy paranasal sinuses, mucus is able to drain out, and air is able to circulate through them by way of your nose. However, when your paranasal sinuses are inflamed, mucus and air can become trapped. This can allow bacteria and other germs to grow and cause infection.  Sinusitis can develop quickly and last only a short time (acute) or continue over a long period (chronic). Sinusitis that lasts for more than 12 weeks is considered chronic.   CAUSES   Causes of sinusitis include:   Allergies.     Structural abnormalities, such as displacement of the cartilage that separates your nostrils (deviated septum), which can decrease the air flow through your nose and sinuses and affect sinus drainage.   Functional abnormalities, such as when the small hairs (cilia) that line your sinuses and help remove mucus do not work properly or are not present.  SYMPTOMS   Symptoms of acute and chronic sinusitis are the same. The primary symptoms are pain and pressure around the affected  sinuses. Other symptoms include:   Upper toothache.   Earache.   Headache.   Bad breath.   Decreased sense of smell and taste.   A cough, which worsens when you are lying flat.   Fatigue.   Fever.   Thick drainage from your nose, which often is green and may contain pus (purulent).   Swelling and warmth over the affected sinuses.  DIAGNOSIS   Your caregiver will perform a physical exam. During the exam, your caregiver may:   Look in your nose for signs of abnormal growths in your nostrils (nasal polyps).   Tap over the affected sinus to check for signs of infection.   View the inside of your sinuses (endoscopy) with a special imaging device with a light attached (endoscope), which is inserted into your sinuses.  If your caregiver suspects that you have chronic sinusitis, one or more of the following tests may be recommended:   Allergy tests.   Nasal culture A sample of mucus is taken from your nose and sent to a lab and screened for bacteria.   Nasal cytology A sample of mucus is taken from your nose and examined by your caregiver to determine if your sinusitis is related to an allergy.  TREATMENT   Most cases of acute sinusitis are related to a viral infection and will resolve on their own within 10 days. Sometimes medicines are prescribed to help relieve symptoms (pain medicine, decongestants, nasal steroid sprays, or saline sprays).   However, for sinusitis related to a bacterial infection, your caregiver will prescribe antibiotic medicines. These are medicines that will help kill the bacteria causing the infection.   Rarely, sinusitis is caused by a fungal infection. In theses cases, your caregiver will prescribe antifungal medicine.  For some cases of chronic sinusitis, surgery is needed. Generally, these are cases in which sinusitis recurs more than 3 times per year, despite other treatments.  HOME CARE INSTRUCTIONS    Drink plenty of water. Water helps thin the mucus so your sinuses can drain  more easily.   Use a humidifier.   Inhale steam 3 to 4 times a day (for example, sit in the bathroom with the shower running).   Apply a warm, moist washcloth to your face 3 to 4 times a day, or as directed by your caregiver.   Use saline nasal sprays to help moisten and clean your sinuses.   Take over-the-counter or prescription medicines for pain, discomfort, or fever only as directed by your caregiver.  SEEK IMMEDIATE MEDICAL CARE IF:   You have increasing pain or severe headaches.   You have nausea, vomiting, or drowsiness.   You have swelling around your face.   You have vision problems.   You have a stiff neck.   You have difficulty breathing.  MAKE SURE YOU:    Understand these instructions.   Will watch your condition.   Will get help right away if you are not doing well or get worse.  Document Released: 08/19/2005 Document Revised: 11/11/2011 Document

## 2012-12-01 ENCOUNTER — Ambulatory Visit (INDEPENDENT_AMBULATORY_CARE_PROVIDER_SITE_OTHER): Payer: Medicare Other | Admitting: Sports Medicine

## 2012-12-01 VITALS — BP 110/70 | Ht 65.0 in | Wt 135.0 lb

## 2012-12-01 DIAGNOSIS — M25512 Pain in left shoulder: Secondary | ICD-10-CM

## 2012-12-01 DIAGNOSIS — M25519 Pain in unspecified shoulder: Secondary | ICD-10-CM

## 2012-12-01 NOTE — Progress Notes (Signed)
Subjective: [name] is a 47 yo female with past history of right lung cancer s/p resection in 2003 presenting for evaluation of left shoulder pain.  Pain started insidiously 4 months ago and has progressively been getting worse.  Pain is 8/10 achy/dull pain located over the entire shoulder, however, predominately in the lateral and posterior area with radiation into the lateral arm.  Worse with putting on her jacket, any overhead activity and picking up her grandchildren.  Better with rest and ibuprofen as needed.  Does admit to the pain waking her up at night suddenly.  Denies any fevers/chills, chest pain, dyspnea, paresthesias or weakness into the hand. Patient had similar symptoms in the right shoulder previously which responded well to physical therapy.  Objective: BP 110/70  Ht 5\' 5"  (1.651 m)  Wt 135 lb (61.236 kg)  BMI 22.47 kg/m2 Gen:  NAD, frail appearing, very tan.  Psych:  Alert and oriented x 3.  Skin:  Well perfused, warm and dry.  Cap refill < 2 seconds.  Msk:  L shoulder without evidence of abnormality or atrophy.  Tender to palpation over the [facility] joint, lateral and posterior shoulder.  Forward flexion decreased to 90 degrees active and 120 degrees passive.  Abduction decreased to 45 degrees active and 100 degrees passive.  External rotation normal in active and passive ROM.  Strength is 2/5 in deltoid, supraspinatus.  Strength 4/5 in teres minor/infraspinatus and subscapularis.  Biceps 5/5, triceps 5/5, wrist dorsi/volar flexion 5/5, hand intrinsics 5/5.  + Neer's, + Hawkin's,   - Spurling's.   Assessment: 47 yo female with 4 months of progressively worsening left shoulder pain with evidence of decreased ROM and significant muscular weakness likely secondary to pain from her positive impingement tests.  DDx today includes subacromial impingement vs rotator cuff pathology (less likely given no trauma) vs AC joint arthritis vs adhesive capsulitis (external ROM is intact so less  likely).  With her clinical picture and testing combined I feel this is likely subacromial impingement.   Plan: 1.  Subacromial impingement  --Counseled patient regarding her clinical picture and discussed a variety of treatment options.  After discussion we decided to pursue a subacromial corticosteroid injection for therapeutic purposes as detailed below.  I would have preferred to ultrasound her shoulder today, however, secondary to limited ROM and pain we were unable to do so.  I am going to obtain AP/Lat images of her shoulder today and refer the patient for formal physical therapy.  I will see her back in 1 month for reevaluation following therapy and if still symptomatic would like to obtain ultrasound of shoulder.    Subacromial Injection:  Pre procedure diagnosis: subacromial impingement   Consent obtained and time out performed.  Area cleaned with alcohol.  40Mg  of depomedrol and 3ml of 0.5% Xylocaine was injected into the subacromial bursa without complication or bleeding. Patient tolerated the procedure well.   Stephanie Coup. Arvilla Market, DO

## 2012-12-04 ENCOUNTER — Encounter: Payer: Self-pay | Admitting: Family Medicine

## 2012-12-04 ENCOUNTER — Ambulatory Visit (INDEPENDENT_AMBULATORY_CARE_PROVIDER_SITE_OTHER): Payer: Medicare Other | Admitting: Family Medicine

## 2012-12-04 ENCOUNTER — Ambulatory Visit (HOSPITAL_COMMUNITY)
Admission: RE | Admit: 2012-12-04 | Discharge: 2012-12-04 | Disposition: A | Discharge status: Home / Self Care | Payer: Medicare Other | Source: Ambulatory Visit | Attending: Sports Medicine | Admitting: Sports Medicine

## 2012-12-04 VITALS — BP 104/71 | HR 98 | Temp 97.7°F | Ht 65.0 in | Wt 136.5 lb

## 2012-12-04 DIAGNOSIS — M25519 Pain in unspecified shoulder: Secondary | ICD-10-CM | POA: Insufficient documentation

## 2012-12-04 DIAGNOSIS — F411 Generalized anxiety disorder: Secondary | ICD-10-CM

## 2012-12-04 DIAGNOSIS — G43909 Migraine, unspecified, not intractable, without status migrainosus: Secondary | ICD-10-CM

## 2012-12-04 DIAGNOSIS — M25512 Pain in left shoulder: Secondary | ICD-10-CM

## 2012-12-04 MED ORDER — ALPRAZOLAM 0.5 MG PO TABS: 0.5000 mg | ORAL_TABLET | ORAL | Status: DC

## 2012-12-04 MED ORDER — TOPIRAMATE 50 MG PO TABS: ORAL_TABLET | ORAL | Status: DC

## 2012-12-04 NOTE — Assessment & Plan Note (Signed)
Well controlled on Topamax.  Continue current regimen.

## 2012-12-04 NOTE — Patient Instructions (Addendum)
It was great to see you today. Please pick up your prescriptions and use as directed. If you develop drowsiness or difficulty breathing, please stop Lorazepam and call your doctor. Schedule follow up appointment with me as needed.

## 2012-12-04 NOTE — Assessment & Plan Note (Signed)
Patient tried taking Celexa but stopped after it made her feel "weird."  She takes Xanax TID and she still appears anxious during the encounter.  Will refill medications today, she does not ever ask for early refills or increase in dosage.  She may benefit from referral to Psychiatry or Dr. Pascal Lux in Mood disorder clinic, especially if new doctor does not feel comfortable prescribing Xanax. F/U in 3 months.

## 2012-12-04 NOTE — Progress Notes (Addendum)
  Subjective:    Patient ID: [name], female    DOB: [birth year], 47 y.o.   MRN: [id number]  HPI  Patient returns for clinic for 6 month follow up medication refills: Xanax and Topamax.  Anxiety: She was supposed to be taking Celexa 20 mg for anxiety in September.  She took for it 2 months, but she stopped taking it due feeling "weird in the mind."  She told her pulmonologist and was advised to stop taking it.  She takes Xanax one and half tablets three times per day.  Last refill was 6 months ago.  She ran out of medication yesterday.  She has not had any anxiety attacks since taking this medication.  Denies any depressed mood.  Denies any homicidal or suicidal thoughts.  Migraine:  Patient takes Topamax everyday and says it has been working.  No recent migraines since taking medication.  2 months ago, she forgot to take Topamax that day and started to see floaters, but no HA.  Review of Systems Per HPI    Objective:   Physical Exam  Constitutional: She appears well-nourished. No distress.  Cardiovascular: Normal rate.   Pulmonary/Chest: Effort normal.  Neurological: She is alert. No cranial nerve deficit.  Psychiatric: Her speech is normal and behavior is normal. Her mood appears anxious. She expresses no homicidal and no suicidal ideation.      Assessment & Plan:

## 2012-12-11 ENCOUNTER — Ambulatory Visit: Payer: No Typology Code available for payment source | Attending: Sports Medicine

## 2012-12-11 DIAGNOSIS — M25519 Pain in unspecified shoulder: Secondary | ICD-10-CM | POA: Insufficient documentation

## 2012-12-11 DIAGNOSIS — IMO0001 Reserved for inherently not codable concepts without codable children: Secondary | ICD-10-CM | POA: Insufficient documentation

## 2012-12-11 DIAGNOSIS — M25619 Stiffness of unspecified shoulder, not elsewhere classified: Secondary | ICD-10-CM | POA: Insufficient documentation

## 2012-12-15 ENCOUNTER — Ambulatory Visit: Payer: No Typology Code available for payment source | Attending: Sports Medicine

## 2012-12-21 ENCOUNTER — Ambulatory Visit: Payer: No Typology Code available for payment source | Admitting: Physical Therapy

## 2012-12-22 ENCOUNTER — Ambulatory Visit (INDEPENDENT_AMBULATORY_CARE_PROVIDER_SITE_OTHER): Payer: No Typology Code available for payment source | Admitting: Internal Medicine

## 2012-12-22 ENCOUNTER — Encounter: Payer: Self-pay | Admitting: Internal Medicine

## 2012-12-22 VITALS — BP 124/78 | HR 113 | Ht 65.25 in | Wt 139.0 lb

## 2012-12-22 DIAGNOSIS — J84111 Idiopathic interstitial pneumonia, not otherwise specified: Secondary | ICD-10-CM

## 2012-12-22 DIAGNOSIS — J45909 Unspecified asthma, uncomplicated: Secondary | ICD-10-CM

## 2012-12-22 MED ORDER — LEVALBUTEROL TARTRATE 45 MCG/ACT IN AERO: 2.0000 | INHALATION_SPRAY | Freq: Four times a day (QID) | RESPIRATORY_TRACT | Status: DC | PRN

## 2012-12-22 MED ORDER — LEVALBUTEROL HCL 0.63 MG/3ML IN NEBU: 1.0000 | INHALATION_SOLUTION | RESPIRATORY_TRACT | Status: DC | PRN

## 2012-12-22 NOTE — Progress Notes (Signed)
Patient ID: [name], female    DOB: 08/22/1966, 47 y.o.   MRN: [id number]  HPI 47 yo former heavy smoker followed after RUL lobectomy for lung cancer, chronic bronchitis, allergic rhinitis, recent diffuse interstitial process. Dr Darnelle Catalan recently saw her for Oncology f/u and repeated CT 11/09/10 - images reviewed. There has been a decrease in the numerous cavitating nodular infiltrates since November comparison. This improved with no treatment. She has seen Dr Edwyna Shell today.  She has been sunning herself, lying outside and blames seasonal pollen for what she considers normal for season mild chest congestion with scant light yellow phlegm. Denies chest pain, fever, night sweats, nodes. Recent PPD skin test was negative. HIV serology was negative. She had chosen to defer bronchoscopy discussed at last visit, because she felt well despite CXR.  02/12/11-  Feels ok. Had a chest cold a couple of weeks ago- largely resolved. Otherwise has felt well. Boyfriend still smokes heavily around her-  makes her cough. Otherwise not noting routine cough, chest pain, palpitation, fever.  06/25/11-44 yo former heavy smoker followed after RUL lobectomy for lung cancer, chronic bronchitis, allergic rhinitis, recent diffuse interstitial process. Cooler air of the fall season and humid air in the shower both cause her chest to tighten. Dr. Edwyna Shell has now released her from his long-term followup. She is coughing and wheezing very little and feels stable. She is still exposed to her boyfriend's cigarette smoke. CXR-06/12/2011-stable post therapy appearance of the chest with nothing new. There is a right hilar/paratracheal mass with surgical clips unchanged from 2010, radiation changes. The widespread pulmonary interstitial opacities which developed over a year ago, are stable and consistent now with scarring.  12/24/11- 54 yo former heavy smoker followed after RUL lobectomy for lung cancer, chronic bronchitis, allergic  rhinitis, recent diffuse interstitial process. Blames pollen for recent tighter chest, mild wheeze, nasal congestion and drainage. Denies fever, pain or blood. Discussed medication refill needs. Educated the difference between Mucinex and antihistamines. Chest x-ray 06/12/2011-reviewed-stable.  06/23/12- 83 yo former heavy smoker followed after RUL lobectomy for lung cancer, chronic bronchitis, allergic rhinitis, recent diffuse interstitial process.  Had flu vaccine Pt c/o increased coughing while showering, pt thinks d/t heat and steam. Pt notices this with severe weather changes as well.  Patient needs printed Rx 30 day supply/refills for Foradil, Xopenex HFA and Neb and Asmanex. Boyfriend still smokes but they got rid of the wood stove. Xopenex has been a big help. Her oncologist will get a followup CT scan but there has been no recurrence of her lung cancer.  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  FOLLOWS FOR: Pt. reports there is no change with her coughing, pt. states she feels stuffy, due to weather change.  Pt. denies any chest tightness or wheezing. Sneezing some from the pollen. Otherwise okay. She has been released for followup by her oncologist after 5 years. medications reviewed. Very sensitive to albuterol-shaky. CT 07/09/12 IMPRESSION:  1. Stable surgical and radiation changes involving the right lung.  No findings for recurrent tumor or pulmonary metastatic disease.  2. 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 Recommend clinical correlation.  Original Report Authenticated By: Rudie Meyer, M.D.  Review of Systems-See HPI Constitutional:   No-   weight loss, night sweats, fevers, chills, fatigue, lassitude. HEENT:  No-  headaches, difficulty  swallowing, tooth/dental problems, sore throat,      +mild sneezing,no- itching, ear ache, nasal congestion, post nasal drip,  CV:  No-   chest pain, orthopnea, PND, swelling in lower extremities, anasarca, dizziness, palpitations Resp: No- acute  shortness of breath with exertion or at rest.              No-   productive cough,  little non-productive cough,  No- coughing up of blood.              No-   change in color of mucus.  +Little- wheezing.   Skin: No-   rash or lesions. GI:  No-   heartburn, indigestion, abdominal pain, nausea, vomiting,  GU:   MS:  No-   joint pain or swelling.  No- decreased range of motion.  No- back pain. Neuro-     nothing unusual Psych:  No- change in mood or affect. No depression or anxiety.  No memory loss.   Objective:   Physical Exam General- Alert, Oriented, Affect-appropriate, Distress- none acute Skin- rash-none, lesions- none, excoriation- none. Always has a somewhat sunburned look. Lymphadenopathy- none Head- atraumatic            Eyes- Gross vision intact, PERRLA, conjunctivae clear secretions            Ears- Hearing, canals-normal            Nose- Clear, no-Septal dev, mucus, polyps, erosion, perforation             Throat- Mallampati II , mucosa clear , drainage- none, tonsils- atrophic Neck- flexible , trachea midline, no stridor , thyroid nl, carotid no bruit Chest - symmetrical excursion , unlabored           Heart/CV- RRR , no murmur , no gallop  , no rub, nl s1 s2                           - JVD- none , edema- none, stasis changes- none, varices- none           Lung- clear to P&A, +mild I&E wheeze, cough- none , dullness-none, rub- none. Unlabored           Chest wall-  Abd- Br/ Gen/ Rectal- Not done, not indicated Extrem- cyanosis- none, clubbing, none, atrophy- none, strength- nl Neuro- grossly intact to observation

## 2012-12-22 NOTE — Patient Instructions (Addendum)
Sample Breo Ellipta  1 puff then rinse mouth, one time daily     Try this instead of Foradil and Asmanex. When you run out of the sample, go back to Foradil and Asmanex like before. See if Virgel Bouquet works better.   Please call as needed

## 2012-12-24 ENCOUNTER — Ambulatory Visit (INDEPENDENT_AMBULATORY_CARE_PROVIDER_SITE_OTHER): Payer: Medicare Other | Admitting: Sports Medicine

## 2012-12-24 VITALS — BP 112/80 | Ht 65.0 in | Wt 139.0 lb

## 2012-12-24 DIAGNOSIS — M25512 Pain in left shoulder: Secondary | ICD-10-CM

## 2012-12-24 DIAGNOSIS — M25519 Pain in unspecified shoulder: Secondary | ICD-10-CM

## 2012-12-24 MED ORDER — TRAMADOL HCL 50 MG PO TABS: 50.0000 mg | ORAL_TABLET | Freq: Every evening | ORAL | Status: DC | PRN

## 2012-12-24 MED ORDER — MELOXICAM 15 MG PO TABS: ORAL_TABLET | ORAL | Status: DC

## 2012-12-24 NOTE — Progress Notes (Signed)
  Subjective:    Patient ID: [name], female    DOB: 1965/09/19, 47 y.o.   MRN: [id number]  HPI Patient comes in today for followup on left shoulder pain. Unfortunately the subacromial cortisone injection only provided her with about one hour of pain relief. She localizes most of her pain to the lateral shoulder with occasional radiating pain down to the elbow. It's worse with reaching overhead or way from her body. She has noticed some associated shaking and weakness due to the pain. Pain will awaken her at night as well. She takes an occasional ibuprofen or BC powder which does seem to help some.    Review of Systems     Objective:   Physical Exam Well-developed, well-nourished. No acute distress.  Left shoulder: Active forward flexion is to about 150. Full internal rotation. Full passive external rotation. Abduction is to about 110. She has pain with empty can and Hawkins impingement tests. Rotator cuff strength is 4/5 with resisted supraspinatus and external rotation. Slight tenderness over the a.c. joint to palpation. No tenderness over the bicipital groove. Neurovascularly intact distally.  X-rays of the left shoulder including AP and lateral views dated 12/04/2012 are reviewed. Minimal a.c. DJD. Otherwise unremarkable.       Assessment & Plan:  1. Left shoulder pain secondary to rotator cuff tendinopathy versus possible rotator cuff tear 2. History of lung cancer 3. Anxiety disorder  Patient will return to the office on Monday, April 28 for an ultrasound evaluation of her left shoulder. In the meantime, I have given her prescriptions for both Mobic and Ultram. We will delineate further workup and treatment based on ultrasound findings and response to medication.

## 2012-12-28 ENCOUNTER — Ambulatory Visit: Payer: No Typology Code available for payment source | Admitting: Physical Therapy

## 2012-12-28 ENCOUNTER — Ambulatory Visit (INDEPENDENT_AMBULATORY_CARE_PROVIDER_SITE_OTHER): Payer: Medicare Other | Admitting: Sports Medicine

## 2012-12-28 ENCOUNTER — Encounter: Payer: Self-pay | Admitting: Sports Medicine

## 2012-12-28 VITALS — BP 134/93 | HR 92 | Ht 65.0 in | Wt 139.0 lb

## 2012-12-28 DIAGNOSIS — M719 Bursopathy, unspecified: Secondary | ICD-10-CM

## 2012-12-28 DIAGNOSIS — M7542 Impingement syndrome of left shoulder: Secondary | ICD-10-CM

## 2012-12-29 NOTE — Progress Notes (Signed)
Patient ID: [name], female   DOB: 01-20-1966, 47 y.o.   MRN: [id number]   Patient comes in today for an ultrasound of her left shoulder. Please see the office note from last week for details regarding history and physical. She has noticed some improvement with taking Mobic. She is not taking her Ultram. She continues to localize her pain to the lateral shoulder.  [facility] ultrasound of the left shoulder: Images obtained both in long and short axis. Biceps tendon was well visualized in the bicipital groove and without any obvious abnormality. Supraspinatus, infraspinatus, and teres minor were all well-visualized as well. I do not appreciate any abnormalities. Specifically, I do not see any abnormal hypoechoic or hyperechoic areas. With dynamic testing there is some impingement of the supraspinatus underneath the acromion.  Impression/plan:  1. Left shoulder pain secondary to rotator cuff impingement  Patient will continue with physical therapy. She will stay on her Mobic for 2 additional weeks and then wean to taking it when necessary. While doing the exam today I did notice that she is lacking about 10-20 of external rotation on the left in comparison to the right. Therefore, I think she is developing a small element of adhesive capsulitis as well. She will followup with me in 3 weeks. If symptoms persist, discussed the possibility of an intra-articular cortisone injection or possibly further diagnostic imaging in the form of an MRI. Call with questions or concerns in the interim.

## 2012-12-29 NOTE — Assessment & Plan Note (Signed)
We are watch this over time. 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 May need to increase steroids above what she gets from Asmanex.

## 2012-12-29 NOTE — Assessment & Plan Note (Signed)
Areas of wheezing component with an underlying chronic bronchitis. 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

## 2013-01-04 ENCOUNTER — Ambulatory Visit: Payer: No Typology Code available for payment source | Attending: Sports Medicine

## 2013-01-04 DIAGNOSIS — M25519 Pain in unspecified shoulder: Secondary | ICD-10-CM | POA: Insufficient documentation

## 2013-01-04 DIAGNOSIS — M25619 Stiffness of unspecified shoulder, not elsewhere classified: Secondary | ICD-10-CM | POA: Insufficient documentation

## 2013-01-04 DIAGNOSIS — IMO0001 Reserved for inherently not codable concepts without codable children: Secondary | ICD-10-CM | POA: Insufficient documentation

## 2013-01-08 ENCOUNTER — Ambulatory Visit: Payer: No Typology Code available for payment source

## 2013-01-11 ENCOUNTER — Ambulatory Visit: Payer: No Typology Code available for payment source

## 2013-01-14 ENCOUNTER — Telehealth: Payer: Self-pay | Admitting: Internal Medicine

## 2013-01-14 MED ORDER — FLUTICASONE FUROATE-VILANTEROL 100-25 MCG/INH IN AEPB: 1.0000 | INHALATION_SPRAY | Freq: Every day | RESPIRATORY_TRACT | Status: DC

## 2013-01-14 NOTE — Telephone Encounter (Signed)
Refill sent. Pt is aware. Jennifer Castillo, CMA  

## 2013-01-14 NOTE — Telephone Encounter (Signed)
PT states that Virgel Bouquet has worked well for her and is requesting a rx be called in.  Please advise if ok to do so.  Last ov states:  Sample Breo Ellipta  1 puff then rinse mouth, one time daily     Try this instead of Foradil and Asmanex. When you run out of the sample, go back to Foradil and Asmanex like before. See if Virgel Bouquet works better.

## 2013-01-14 NOTE — Telephone Encounter (Signed)
Per [facility]-Rx Breo # 1 1 puff QD and RINSE MOUTH AFTER USE with prn refills.

## 2013-01-15 ENCOUNTER — Ambulatory Visit: Payer: No Typology Code available for payment source

## 2013-01-18 ENCOUNTER — Ambulatory Visit (INDEPENDENT_AMBULATORY_CARE_PROVIDER_SITE_OTHER): Payer: Medicare Other | Admitting: Sports Medicine

## 2013-01-18 VITALS — BP 106/64 | Ht 65.0 in | Wt 135.0 lb

## 2013-01-18 DIAGNOSIS — M7502 Adhesive capsulitis of left shoulder: Secondary | ICD-10-CM

## 2013-01-18 DIAGNOSIS — M67919 Unspecified disorder of synovium and tendon, unspecified shoulder: Secondary | ICD-10-CM

## 2013-01-18 DIAGNOSIS — M7500 Adhesive capsulitis of unspecified shoulder: Secondary | ICD-10-CM

## 2013-01-18 DIAGNOSIS — M7542 Impingement syndrome of left shoulder: Secondary | ICD-10-CM

## 2013-01-18 MED ORDER — MELOXICAM 15 MG PO TABS: ORAL_TABLET | ORAL | Status: DC

## 2013-01-18 NOTE — Progress Notes (Signed)
  Subjective:    Patient ID: [name], female    DOB: 13-Dec-1965, 47 y.o.   MRN: [id number]  HPI Patient comes in today for followup on her left shoulder pain. Overall she is feeling much better. She is still needing to take Mobic intermittently and she continues to work diligently in physical therapy.    Review of Systems     Objective:   Physical Exam Well-developed, well-nourished. No acute distress  Left shoulder shows full forward flexion and abduction to 160 degrees. Full internal rotation. She still lacks about 10-15 of external rotation. Rotator cuff strength is 5/5. Slightly positive empty can and slightly positive Hawkins. Neurovascularly intact distally.      Assessment & Plan:  1. 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 I've refilled her Mobic to take when necessary. Given her improvement I'm going to hold on an intra-articular glenohumeral injection but I could reconsider this down the road if her symptoms once again worsen. Increase activity as tolerated. Followup when necessary.

## 2013-01-27 ENCOUNTER — Ambulatory Visit: Payer: No Typology Code available for payment source

## 2013-05-13 ENCOUNTER — Encounter: Payer: Self-pay | Admitting: Sports Medicine

## 2013-05-13 ENCOUNTER — Telehealth: Payer: Self-pay | Admitting: Sports Medicine

## 2013-05-13 ENCOUNTER — Ambulatory Visit (INDEPENDENT_AMBULATORY_CARE_PROVIDER_SITE_OTHER): Payer: No Typology Code available for payment source | Admitting: Sports Medicine

## 2013-05-13 VITALS — BP 119/78 | HR 93 | Temp 98.0°F | Ht 65.0 in | Wt 129.8 lb

## 2013-05-13 DIAGNOSIS — F410 Panic disorder [episodic paroxysmal anxiety] without agoraphobia: Secondary | ICD-10-CM

## 2013-05-13 DIAGNOSIS — G43909 Migraine, unspecified, not intractable, without status migrainosus: Secondary | ICD-10-CM

## 2013-05-13 DIAGNOSIS — E7800 Pure hypercholesterolemia, unspecified: Secondary | ICD-10-CM

## 2013-05-13 DIAGNOSIS — Z23 Encounter for immunization: Secondary | ICD-10-CM

## 2013-05-13 LAB — CBC
Hemoglobin: 14.6 g/dL (ref 12.0–15.0)
Platelets: 221 10*3/uL (ref 150–400)
RBC: 4.63 MIL/uL (ref 3.87–5.11)
WBC: 7 10*3/uL (ref 4.0–10.5)

## 2013-05-13 LAB — COMPREHENSIVE METABOLIC PANEL
Albumin: 3.7 g/dL (ref 3.5–5.2)
Alkaline Phosphatase: 66 U/L (ref 39–117)
BUN: 7 mg/dL (ref 6–23)
CO2: 24 mEq/L (ref 19–32)
Calcium: 9 mg/dL (ref 8.4–10.5)
Chloride: 110 mEq/L (ref 96–112)
Glucose, Bld: 85 mg/dL (ref 70–99)
Potassium: 3.8 mEq/L (ref 3.5–5.3)
Total Protein: 6.7 g/dL (ref 6.0–8.3)

## 2013-05-13 LAB — LIPID PANEL
Cholesterol: 178 mg/dL (ref 0–200)
Total CHOL/HDL Ratio: 4.6 Ratio

## 2013-05-13 MED ORDER — TOPIRAMATE 50 MG PO TABS: ORAL_TABLET | ORAL | Status: DC

## 2013-05-13 MED ORDER — ALPRAZOLAM 0.5 MG PO TABS: 0.5000 mg | ORAL_TABLET | ORAL | Status: DC

## 2013-05-13 NOTE — Telephone Encounter (Signed)
Pt came in stating that her prescription for alprazolam is wrong the qty: is 90 but pt stated that the qty: should be 135.

## 2013-05-13 NOTE — Patient Instructions (Addendum)
It was nice to meet you. We are checking labs. Flu shot and Tetanus shot today Return in 6 months

## 2013-05-14 NOTE — Telephone Encounter (Signed)
The patient was provided a prescription for 135 tablets which is the quantity for a month and a half with 3 refills during her office visit.  She declined this prescription and insisted on having a 6 month supply with 5 refills.  90 tablets taken up to 3 times a day PRN was filled at the time of her office visit (#90 with 5 refills).  This is a sufficient quantity for 6 months.  If there are any further questions she will need to come back to readdress this.

## 2013-05-21 NOTE — Progress Notes (Signed)
[facility]  [name] - 47 y.o. female MRN [id number]  Date of birth: 1966/03/06  HPI & ROS  [name] presents today for yearly exam.  No concerns.  Doing well over the past year.  Reports breathing is at baseline, using inhalers per pulm doctors.  Pt denies chest pain, palpitations, shortness of breath/DOE, orthopnea/PND, LE swelling.   No fevers, chills.  Intentional 5# weight loss.    Care Coordination & Pertinent History   The patients history is remarkable for: # RESP: Hx of Lung CA s/p resection and chemo/radiation, snoring disorder, recurrent infections including idiopathic pneumonia.  Hx of Asthma with bronchitis  CA in remission currently # PSYCH/Neuro: Panic Attacks; migraine  Stable on Xanax and Topamax Health Care Maintenance:  UpToDate - s/p TVH Brief Social Hx   Former Smoker - quit in 2004 with Dx of Lung CA  Other Specialists: Pulmonology -  Dr. Maple Hudson    Otherwise please see associated EMR sections for complete problem List, past medical history, past surgical history, family history and social history. Objective Findings  VITALS: BP 119/78  Pulse 93  Temp(Src) 98 F (36.7 C) (Oral)  Ht 5\' 5"  (1.651 m)  Wt 129 lb 12.8 oz (58.877 kg)  BMI 21.6 kg/m2 PHYSICAL EXAM: GENERAL: Adult  female. In no discomfort; no respiratory distress  PSYCH: alert and appropriate, good insight   HNEENT:   CARDIO: RRR, S1/S2 heard, no murmur  LUNGS: CTA B, no wheezes, no crackles  ABDOMEN:    EXTREM:    Warm, well perfused.  Moves all 4 extremities spontaneously; no lateralization.  No noted foot lesions.  Distal pulses intact.  no pretibial edema.  GU:    SKIN:     NEUROMSK:        Medication & Orders   Previous Medications   FLUTICASONE FUROATE-VILANTEROL (BREO ELLIPTA) 100-25 MCG/INH AEPB    Inhale 1 puff into the lungs daily. Rinse after each use   LEVALBUTEROL (XOPENEX HFA) 45 MCG/ACT INHALER    Inhale 2 puffs into the lungs every 6 (six)  hours as needed for wheezing or shortness of breath.   LEVALBUTEROL (XOPENEX) 0.63 MG/3ML NEBULIZER SOLUTION    Take 3 mLs (0.63 mg total) by nebulization every 4 (four) hours as needed for wheezing or shortness of breath.   Modified Medications   Modified Medication Previous Medication   ALPRAZOLAM (XANAX) 0.5 MG TABLET ALPRAZolam (XANAX) 0.5 MG tablet      Take 1 tablet (0.5 mg total) by mouth as directed. one and one half tabs by mouth three times a day as needed anxiety    Take 1 tablet (0.5 mg total) by mouth as directed. one and one half tabs by mouth three times a day as needed anxiety   TOPIRAMATE (TOPAMAX) 50 MG TABLET topiramate (TOPAMAX) 50 MG tablet      take 1 and 1/2 tablets by mouth BID    take 1 and 1/2 tablets by mouth BID   New Prescriptions   No medications on file   Discontinued Medications   FORMOTEROL (FORADIL) 12 MCG CAPSULE FOR INHALER    Place 1 capsule (12 mcg total) into inhaler and inhale 2 (two) times daily.   MELOXICAM (MOBIC) 15 MG TABLET    Take one a day with food   MELOXICAM (MOBIC) 15 MG TABLET    Take one tab daily with food prn   MOMETASONE (ASMANEX 60 METERED DOSES) 220 MCG/INH INHALER    Inhale  1 puff into the lungs 2 (two) times daily. Rinse mouth   TRAMADOL (ULTRAM) 50 MG TABLET    Take 1 tablet (50 mg total) by mouth at bedtime and may repeat dose one time if needed.   Orders Placed This Encounter  Procedures  . Tdap vaccine greater than or equal to 7yo IM  . Lipid panel  . Comprehensive metabolic panel  . CBC   Assessment   1. HYPERCHOLESTEROLEMIA   2. Need for Tdap vaccination   3. Need for prophylactic vaccination and inoculation against influenza   4. PANIC ATTACKS   5. MIGRAINE, UNSPEC., W/O INTRACTABLE MIGRAINE     Plan    Check lipids and CMET Refill Xanax and topamax Update TDap and Flu MEDICATION LIST UPDATED    Follow Up Issues: > Consider starting statin if ASVD risk elevated.

## 2013-05-24 ENCOUNTER — Encounter: Payer: Self-pay | Admitting: Sports Medicine

## 2013-05-26 ENCOUNTER — Encounter: Payer: Self-pay | Admitting: Sports Medicine

## 2013-06-10 ENCOUNTER — Other Ambulatory Visit: Payer: Self-pay | Admitting: Sports Medicine

## 2013-06-10 MED ORDER — TOPIRAMATE 50 MG PO TABS: ORAL_TABLET | ORAL | Status: DC

## 2013-06-24 ENCOUNTER — Ambulatory Visit (INDEPENDENT_AMBULATORY_CARE_PROVIDER_SITE_OTHER)
Admission: RE | Admit: 2013-06-24 | Discharge: 2013-06-24 | Disposition: A | Discharge status: Home / Self Care | Payer: Medicare Other | Source: Ambulatory Visit | Attending: Internal Medicine | Admitting: Internal Medicine

## 2013-06-24 ENCOUNTER — Encounter: Payer: Self-pay | Admitting: Internal Medicine

## 2013-06-24 ENCOUNTER — Ambulatory Visit (INDEPENDENT_AMBULATORY_CARE_PROVIDER_SITE_OTHER): Payer: Medicare Other | Admitting: Internal Medicine

## 2013-06-24 VITALS — BP 102/70 | HR 90 | Ht 65.25 in | Wt 130.4 lb

## 2013-06-24 DIAGNOSIS — J45909 Unspecified asthma, uncomplicated: Secondary | ICD-10-CM

## 2013-06-24 DIAGNOSIS — J42 Unspecified chronic bronchitis: Secondary | ICD-10-CM

## 2013-06-24 DIAGNOSIS — J84111 Idiopathic interstitial pneumonia, not otherwise specified: Secondary | ICD-10-CM

## 2013-06-24 DIAGNOSIS — C3490 Malignant neoplasm of unspecified part of unspecified bronchus or lung: Secondary | ICD-10-CM

## 2013-06-24 NOTE — Progress Notes (Signed)
Patient ID: [name], female    DOB: Jan 06, 1966, 47 y.o.   MRN: [id number]  HPI 47 yo former heavy smoker followed after RUL lobectomy for lung cancer, chronic bronchitis, allergic rhinitis, recent diffuse interstitial process. Dr Jana Hakim recently saw her for Oncology f/u and repeated CT 11/09/10 - images reviewed. There has been a decrease in the numerous cavitating nodular infiltrates since November comparison. This improved with no treatment. She has seen Dr Arlyce Dice today.  She has been sunning herself, lying outside and blames seasonal pollen for what she considers normal for season mild chest congestion with scant light yellow phlegm. Denies chest pain, fever, night sweats, nodes. Recent PPD skin test was negative. HIV serology was negative. She had chosen to defer bronchoscopy discussed at last visit, because she felt well despite CXR.  02/12/11-  Feels ok. Had a chest cold a couple of weeks ago- largely resolved. Otherwise has felt well. Boyfriend still smokes heavily around her-  makes her cough. Otherwise not noting routine cough, chest pain, palpitation, fever.  06/25/11-44 yo former heavy smoker followed after RUL lobectomy for lung cancer, chronic bronchitis, allergic rhinitis, recent diffuse interstitial process. Cooler air of the fall season and humid air in the shower both cause her chest to tighten. Dr. Arlyce Dice has now released her from his long-term followup. She is coughing and wheezing very little and feels stable. She is still exposed to her boyfriend's cigarette smoke. CXR-06/12/2011-stable post therapy appearance of the chest with nothing new. There is a right hilar/paratracheal mass with surgical clips unchanged from 2010, radiation changes. The widespread pulmonary interstitial opacities which developed over a year ago, are stable and consistent now with scarring.  12/24/11- 62 yo former heavy smoker followed after RUL lobectomy for lung cancer, chronic bronchitis, allergic  rhinitis, recent diffuse interstitial process. Blames pollen for recent tighter chest, mild wheeze, nasal congestion and drainage. Denies fever, pain or blood. Discussed medication refill needs. Educated the difference between Mucinex and antihistamines. Chest x-ray 06/12/2011-reviewed-stable.  06/23/12- 63 yo former heavy smoker followed after RUL lobectomy for lung cancer, chronic bronchitis, allergic rhinitis, recent diffuse interstitial process.  Had flu vaccine Pt c/o increased coughing while showering, pt thinks d/t heat and steam. Pt notices this with severe weather changes as well.  Patient needs printed Rx 30 day supply/refills for Foradil, Xopenex HFA and Neb and Asmanex. Boyfriend still smokes but they got rid of the wood stove. Xopenex has been a big help. Her oncologist will get a followup CT scan but there has been no recurrence of her lung cancer.  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  FOLLOWS FOR: Pt. reports there is no change with her coughing, pt. states she feels stuffy, due to weather change.  Pt. denies any chest tightness or wheezing. Sneezing some from the pollen. Otherwise okay. She has been released for followup by her oncologist after 5 years. medications reviewed. Very sensitive to albuterol-shaky. CT 07/09/12 IMPRESSION:  1. Stable surgical and radiation changes involving the right lung.  No findings for recurrent tumor or pulmonary metastatic disease.  2. 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 Recommend clinical correlation.  Original Report Authenticated By: Marijo Sanes, M.D.  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  FOLLOWS ZOX:WRUEAVWUJ is about the same however having slight increase in cough(gets this time of year) Suszanne Conners better as well. Had flu vaccine. Cooler weather makes chest feel a little tighter  Review of Systems-See HPI Constitutional:   No-   weight loss, night sweats, fevers, chills, fatigue, lassitude. HEENT:   No-  headaches, difficulty swallowing, tooth/dental problems, sore throat,      +mild sneezing,no- itching, ear ache, nasal congestion, post nasal drip,  CV:  No-   chest pain, orthopnea, PND, swelling in lower extremities, anasarca, dizziness, palpitations Resp: No- acute  shortness of breath with exertion or at rest.              No-   productive cough,  little non-productive cough,  No- coughing up of blood.              No-   change in color of mucus.  +Little- wheezing.   Skin: No-   rash or lesions. GI:  No-   heartburn, indigestion, abdominal pain, nausea, vomiting,  GU:   MS:  No-   joint pain or swelling.   Neuro-     nothing unusual Psych:  No- change in mood or affect. No depression or anxiety.  No memory loss.   Objective:   Physical Exam General- Alert, Oriented, Affect-appropriate, Distress- none acute Skin- rash-none, lesions- none, excoriation- none. Always has a somewhat sunburned look. Lymphadenopathy- none Head- atraumatic            Eyes- Gross vision intact, PERRLA, conjunctivae clear secretions            Ears- Hearing, canals-normal            Nose- Clear, no-Septal dev, mucus, polyps, erosion, perforation             Throat- Mallampati II , mucosa clear , drainage- none, tonsils- atrophic Neck- flexible , trachea midline, no stridor , thyroid nl, carotid no bruit Chest - symmetrical excursion , unlabored           Heart/CV- RRR , no murmur , no gallop  , no rub, nl s1 s2                           - JVD- none , edema- none, stasis changes- none, varices- none           Lung- clear to P&A, +mild I&E wheeze, cough+with deep  breath , dullness-none, rub- none. Unlabored           Chest wall-  Abd- Br/ Gen/ Rectal- Not done, not indicated Extrem- cyanosis- none, clubbing, none, atrophy- none, strength- nl Neuro- grossly intact to observation

## 2013-06-24 NOTE — Progress Notes (Signed)
Quick Note:  Advised pt of cxr results per CY. Pt verbalized understanding and has further questions ______

## 2013-06-24 NOTE — Patient Instructions (Signed)
We can continue present meds- please call as needed  Order- CXR   Dx chronic bronchitis  We will fix your immunization record to show that you had the PPSV-23

## 2013-07-11 ENCOUNTER — Encounter: Payer: Self-pay | Admitting: Internal Medicine

## 2013-07-11 NOTE — Assessment & Plan Note (Signed)
No recurrence. 

## 2013-07-11 NOTE — Assessment & Plan Note (Signed)
Resolved after smoking cessation

## 2013-07-11 NOTE — Assessment & Plan Note (Signed)
Mild exacerbation associated with weather change. No obvious infection. Plan-continue meds, call for refills. Chest x-ray.

## 2013-09-15 ENCOUNTER — Telehealth: Payer: Self-pay | Admitting: Internal Medicine

## 2013-09-15 MED ORDER — PROMETHAZINE-CODEINE 6.25-10 MG/5ML PO SYRP: 5.0000 mL | ORAL_SOLUTION | Freq: Four times a day (QID) | ORAL | Status: DC | PRN

## 2013-09-15 MED ORDER — DOXYCYCLINE HYCLATE 100 MG PO TABS: ORAL_TABLET | ORAL | Status: DC

## 2013-09-15 NOTE — Telephone Encounter (Signed)
Called and spoke with pt and she is aware of CY recs and that these meds have been called to the pharmacy.

## 2013-09-15 NOTE — Telephone Encounter (Signed)
Offer  Doxycycline 100 mg, # 8, 2 today then one daily]  Prometh codeine 200 ml     1 tsp every 6 hours if needed for cough

## 2013-09-15 NOTE — Telephone Encounter (Signed)
Called and spoke with pt and she stated that x  1 week she has been having cough with light yellow/green sputum, chest congestion, low grade fever, not able to sleep at night due to the cough.  Denies any body aches.  Pt has been using mucinex and otc cough meds that are not helping.  Pt is requesting that something be called into the pharmacy.  CY please advise. Thanks  Last ov--06/24/13 Next ov--12/23/13  No Known Allergies   Current Outpatient Prescriptions on File Prior to Visit  Medication Sig Dispense Refill  . ALPRAZolam (XANAX) 0.5 MG tablet Take 1 tablet (0.5 mg total) by mouth as directed. one and one half tabs by mouth three times a day as needed anxiety  90 tablet  5  . Fluticasone Furoate-Vilanterol (BREO ELLIPTA) 100-25 MCG/INH AEPB Inhale 1 puff into the lungs daily. Rinse after each use  1 each  11  . levalbuterol (XOPENEX HFA) 45 MCG/ACT inhaler Inhale 2 puffs into the lungs every 6 (six) hours as needed for wheezing or shortness of breath.  1 Inhaler  prn  . levalbuterol (XOPENEX) 0.63 MG/3ML nebulizer solution Take 3 mLs (0.63 mg total) by nebulization every 4 (four) hours as needed for wheezing or shortness of breath.  75 mL  prn  . topiramate (TOPAMAX) 50 MG tablet take 1 and 1/2 tablets by mouth BID  180 tablet  1   No current facility-administered medications on file prior to visit.

## 2013-11-08 ENCOUNTER — Telehealth: Payer: Self-pay | Admitting: Sports Medicine

## 2013-11-08 NOTE — Telephone Encounter (Signed)
Pt called and would like enough refills on her medication to hold her until her appointment on 03/16. Topamax and Alprazolam. jw

## 2013-11-09 MED ORDER — ALPRAZOLAM 0.5 MG PO TABS: 0.5000 mg | ORAL_TABLET | ORAL | Status: DC

## 2013-11-09 MED ORDER — TOPIRAMATE 50 MG PO TABS: ORAL_TABLET | ORAL | Status: DC

## 2013-11-09 NOTE — Telephone Encounter (Signed)
Pt called again and she is out of both medications

## 2013-11-09 NOTE — Telephone Encounter (Signed)
Rx printed for 1 month.  Rx given to [facility]

## 2013-11-09 NOTE — Telephone Encounter (Signed)
Pt is aware that rx were left upfront for pick-up.Talen Poser, Lewie Loron

## 2013-11-15 ENCOUNTER — Encounter: Payer: Self-pay | Admitting: Sports Medicine

## 2013-11-15 ENCOUNTER — Ambulatory Visit (INDEPENDENT_AMBULATORY_CARE_PROVIDER_SITE_OTHER): Payer: Medicare Other | Admitting: Sports Medicine

## 2013-11-15 VITALS — BP 106/70 | HR 65 | Temp 98.5°F | Ht 65.0 in | Wt 128.0 lb

## 2013-11-15 DIAGNOSIS — J45909 Unspecified asthma, uncomplicated: Secondary | ICD-10-CM

## 2013-11-15 DIAGNOSIS — F410 Panic disorder [episodic paroxysmal anxiety] without agoraphobia: Secondary | ICD-10-CM

## 2013-11-15 DIAGNOSIS — F411 Generalized anxiety disorder: Secondary | ICD-10-CM

## 2013-11-15 DIAGNOSIS — G43909 Migraine, unspecified, not intractable, without status migrainosus: Secondary | ICD-10-CM

## 2013-11-15 MED ORDER — ALPRAZOLAM 0.5 MG PO TABS: 0.5000 mg | ORAL_TABLET | Freq: Three times a day (TID) | ORAL | Status: DC | PRN

## 2013-11-15 MED ORDER — TOPIRAMATE 100 MG PO TABS: 100.0000 mg | ORAL_TABLET | Freq: Two times a day (BID) | ORAL | Status: DC

## 2013-11-15 NOTE — Patient Instructions (Addendum)
Please schedule a followup appointment with [name] before the end of the month.  I have increased your Topamax to 100 mg twice per day.  Please return in 3 months so we can check labs at that time.

## 2013-11-15 NOTE — Progress Notes (Signed)
[name] - 48 y.o. female MRN [id number]  Date of birth: 06/04/1966  SUBJECTIVE:  Pt is here today with a chief complaint of: Medication Refill   She reports overall doing fairly well. For further subjective including (HPI, Interval History & ROS) please see problem based charting  HISTORY: The patients history is remarkable for: # RESP: Hx of Lung CA s/p resection and chemo/radiation, snoring disorder, recurrent infections including idiopathic pneumonia.  Hx of Asthma with bronchitis  CA in remission currently # PSYCH/Neuro: Panic Attacks; migraine  Stable on Xanax and Topamax Health Care Maintenance:  UpToDate - s/p TVH Brief Social Hx   Former Smoker - quit in 2004 with Dx of Lung CA  Other Specialists: Pulmonology -  Dr. Annamaria Boots       Recent Labs  05/13/13 1052  TRIG 157*  CHOL 178  HDL 39*  LDLCALC 108*  } Wt Readings from Last 3 Encounters:  11/15/13 128 lb (58.06 kg)  06/24/13 130 lb 6.4 oz (59.149 kg)  05/13/13 129 lb 12.8 oz (58.877 kg)   BP Readings from Last 3 Encounters:  11/15/13 106/70  06/24/13 102/70  05/13/13 119/78    History  Smoking status  . Former Smoker  . Types: Cigarettes  . Quit date: 10/26/2001  Smokeless tobacco  . Not on file    Comment: Significant passive exposure from boy friend   Health Maintenance Due  Topic  . Pap Smear     Otherwise past Medical, Surgical, Social, and Family History Reviewed per EMR Medications and Allergies reviewed and updated per below.  VITALS: BP 106/70  Pulse 65  Temp(Src) 98.5 F (36.9 C) (Oral)  Ht 5\' 5"  (1.651 m)  Wt 128 lb (58.06 kg)  BMI 21.30 kg/m2  PHYSICAL EXAM: GENERAL:  adult, thin, Caucasian female. In no discomfort; no respiratory distress  PSYCH: alert and appropriate, anxious but good insight   HNEENT:  no JVD,   CARDIO: RRR, S1/S2 heard, no murmur  LUNGS: CTA B, no wheezes, no crackles, smells of cigarette smoke (reported as secondhand)  ABDOMEN:   EXTREM:   Warm, well perfused.  Moves all 4 extremities spontaneously; no lateralization. Distal pulses 2+/4.  No pretibial edema.  GU:   SKIN:     MEDICATIONS, LABS & OTHER ORDERS: Previous Medications   FLUTICASONE FUROATE-VILANTEROL (BREO ELLIPTA) 100-25 MCG/INH AEPB    Inhale 1 puff into the lungs daily. Rinse after each use   LEVALBUTEROL (XOPENEX HFA) 45 MCG/ACT INHALER    Inhale 2 puffs into the lungs every 6 (six) hours as needed for wheezing or shortness of breath.   LEVALBUTEROL (XOPENEX) 0.63 MG/3ML NEBULIZER SOLUTION    Take 3 mLs (0.63 mg total) by nebulization every 4 (four) hours as needed for wheezing or shortness of breath.   Modified Medications   Modified Medication Previous Medication   ALPRAZOLAM (XANAX) 0.5 MG TABLET ALPRAZolam (XANAX) 0.5 MG tablet      Take 1 tablet (0.5 mg total) by mouth 3 (three) times daily as needed for anxiety.    Take 1 tablet (0.5 mg total) by mouth as directed. one and one half tabs by mouth three times a day as needed anxiety   TOPIRAMATE (TOPAMAX) 100 MG TABLET topiramate (TOPAMAX) 50 MG tablet      Take 1 tablet (100 mg total) by mouth 2 (two) times daily.    take 1 and 1/2 tablets by mouth BID   New Prescriptions   No medications on file  Discontinued Medications   DOXYCYCLINE (VIBRA-TABS) 100 MG TABLET    2 today then 1 daily until gone   PROMETHAZINE-CODEINE (PHENERGAN WITH CODEINE) 6.25-10 MG/5ML SYRUP    Take 5 mLs by mouth every 6 (six) hours as needed for cough.  No orders of the defined types were placed in this encounter.   ASSESSMENT & PLAN: See problem based charting & AVS for pt instructions.

## 2013-11-17 MED ORDER — TOPIRAMATE 100 MG PO TABS: 100.0000 mg | ORAL_TABLET | Freq: Two times a day (BID) | ORAL | Status: DC

## 2013-11-17 NOTE — Assessment & Plan Note (Addendum)
Problem Based Documentation:    Subjective Report:  Reports overall has been doing well but did have increased anxiety regarding decreasing her prior Xanax prescriptions.  She initially reported she got a month and a half supplies with each prescription previously and that she was taking no more than 3 per day so this is what was provided.  She reports this did make her on easy.  Typically coping well but has been taking Xanax routinely 3 times a day  Reports sleep is overall fairly good     Assessment & Plan & Follow up Issues:  Chronic condition 1. Refill Xanax for 0.5 mg by mouth 3 times a day 2. Discuss alternative medications and decided to try to obtain a better therapeutic effect with medications she is currently on.  Although not primarily indicated will trial titration of Topamax to 100 mg by mouth daily (previously 75 mg) > Follow up anxiety and Xanax use .

## 2013-11-17 NOTE — Assessment & Plan Note (Signed)
Problem Based Documentation:    Subjective Report:  Reports breathing has been okay over the past one month.  Recent exacerbation and January.  No problems with breathing medications, using as prescribed  Continues to have s significant econdhand smoke exposure     Assessment & Plan & Follow up Issues:  Chronic, controlled condition 1. Encouraged avoidance of secondhand smoke >  Watch for exacerbations .

## 2013-11-17 NOTE — Assessment & Plan Note (Signed)
Problem Based Documentation:    Subjective Report:  Seems to be well controlled on Topamax.  Only occasional non-debilitating headaches.   No side effects from Topamax reported     Assessment & Plan & Follow up Issues:  Chronic, controlled condition 1. see anxiety section > Watch for worsening of anxiety  .

## 2013-11-25 ENCOUNTER — Encounter: Payer: Self-pay | Admitting: Home Health Services

## 2013-11-25 ENCOUNTER — Ambulatory Visit (INDEPENDENT_AMBULATORY_CARE_PROVIDER_SITE_OTHER): Payer: Medicare Other | Admitting: Home Health Services

## 2013-11-25 VITALS — BP 108/77 | HR 90 | Temp 97.4°F | Ht 65.0 in | Wt 128.0 lb

## 2013-11-25 DIAGNOSIS — Z0000 Encounter for general adult medical examination without abnormal findings: Secondary | ICD-10-CM

## 2013-11-25 NOTE — Progress Notes (Signed)
Patient here for annual wellness visit, patient reports: Risk Factors/Conditions needing evaluation or treatment: Pt does not have any new risk factors that need evaluation. Home Safety: Pt lives by self in mobile home.  Pt reports having smoke detectors and does not have adaptive equipment in the bathroom.  Other Information: Corrective lens: Pt wears corrective lens for reading.  Does not have regular eye exams. Dentures: Pt does not have dentures.  Does not have regular dental exams. Memory: Pt reports some memory problems. Patient's Mini Mental Score (recorded in doc. flowsheet): 22 - Pt's highest education level is 10 grade, has low literacy BMI/Exercise:  We discussed BMI and that she is in the normal range and ways to maintain her weight.  Pt has self-reported fear of becoming fat and sometimes skips meals.  Pt does not feel she has an eating disorder.  We discussed regular exercise and pt reports that she is unable to do a lot because of her poor breathing. Med Adherence:  We reviewed medications and discussed taking all prescriptions as prescribed daily.  Pt reports 0 missed days in the past week. ADL/IAD:  Pt reports independence in all functions. Bladder control:  Pt reports some problems with controlling urine. Balance/Gait: Pt reports no falls in the past 12 months.  We discussed strategies for home safety and fall prevention.     Annual Wellness Visit Requirements Recorded Today In  Medical, family, social history Past Medical, Family, Social History Section  Current providers Care team  Current medications Medications  Wt, BP, Ht, BMI Vital signs  Hearing assessment (welcome visit) Hearing/vision  Tobacco, alcohol, illicit drug use History  ADL Nurse Assessment  Depression Screening Nurse Assessment  Cognitive impairment Nurse Assessment  Mini Mental Status Document Flowsheet  Fall Risk Fall/Depression  Home Safety Progress Note  End of Life Planning (welcome visit)  Social Documentation  Medicare preventative services Progress Note  Risk factors/conditions needing evaluation/treatment Progress Note  Personalized health advice Patient Instructions, goals, letter  Diet & Exercise Social Documentation  Emergency Contact Social Documentation  Seat Belts Social Documentation  Sun exposure/protection Social Documentation

## 2013-11-26 ENCOUNTER — Encounter: Payer: Self-pay | Admitting: Home Health Services

## 2013-11-26 NOTE — Progress Notes (Signed)
I have reviewed this visit and discussed with Suzanne Lineberry and agree with her documentation  

## 2013-12-23 ENCOUNTER — Ambulatory Visit (INDEPENDENT_AMBULATORY_CARE_PROVIDER_SITE_OTHER): Payer: Medicare Other | Admitting: Internal Medicine

## 2013-12-23 ENCOUNTER — Encounter: Payer: Self-pay | Admitting: Internal Medicine

## 2013-12-23 VITALS — BP 128/82 | HR 84 | Ht 65.25 in | Wt 130.6 lb

## 2013-12-23 DIAGNOSIS — J302 Other seasonal allergic rhinitis: Secondary | ICD-10-CM

## 2013-12-23 DIAGNOSIS — J3089 Other allergic rhinitis: Secondary | ICD-10-CM

## 2013-12-23 DIAGNOSIS — J309 Allergic rhinitis, unspecified: Secondary | ICD-10-CM

## 2013-12-23 DIAGNOSIS — C3490 Malignant neoplasm of unspecified part of unspecified bronchus or lung: Secondary | ICD-10-CM

## 2013-12-23 DIAGNOSIS — J45909 Unspecified asthma, uncomplicated: Secondary | ICD-10-CM

## 2013-12-23 MED ORDER — LEVALBUTEROL TARTRATE 45 MCG/ACT IN AERO: 2.0000 | INHALATION_SPRAY | Freq: Four times a day (QID) | RESPIRATORY_TRACT | Status: DC | PRN

## 2013-12-23 MED ORDER — LEVALBUTEROL HCL 0.63 MG/3ML IN NEBU: 0.6300 mg | INHALATION_SOLUTION | RESPIRATORY_TRACT | Status: DC | PRN

## 2013-12-23 MED ORDER — FLUTICASONE FUROATE-VILANTEROL 100-25 MCG/INH IN AEPB: 1.0000 | INHALATION_SPRAY | Freq: Every day | RESPIRATORY_TRACT | Status: DC

## 2013-12-23 MED ORDER — AZITHROMYCIN 250 MG PO TABS: ORAL_TABLET | ORAL | Status: DC

## 2013-12-23 NOTE — Progress Notes (Signed)
Patient ID: [name], female    DOB: Jan 06, 1966, 48 y.o.   MRN: [id number]  HPI 48 yo former heavy smoker followed after RUL lobectomy for lung cancer, chronic bronchitis, allergic rhinitis, recent diffuse interstitial process. Dr Jana Hakim recently saw her for Oncology f/u and repeated CT 11/09/10 - images reviewed. There has been a decrease in the numerous cavitating nodular infiltrates since November comparison. This improved with no treatment. She has seen Dr Arlyce Dice today.  She has been sunning herself, lying outside and blames seasonal pollen for what she considers normal for season mild chest congestion with scant light yellow phlegm. Denies chest pain, fever, night sweats, nodes. Recent PPD skin test was negative. HIV serology was negative. She had chosen to defer bronchoscopy discussed at last visit, because she felt well despite CXR.  02/12/11-  Feels ok. Had a chest cold a couple of weeks ago- largely resolved. Otherwise has felt well. Boyfriend still smokes heavily around her-  makes her cough. Otherwise not noting routine cough, chest pain, palpitation, fever.  06/25/11-44 yo former heavy smoker followed after RUL lobectomy for lung cancer, chronic bronchitis, allergic rhinitis, recent diffuse interstitial process. Cooler air of the fall season and humid air in the shower both cause her chest to tighten. Dr. Arlyce Dice has now released her from his long-term followup. She is coughing and wheezing very little and feels stable. She is still exposed to her boyfriend's cigarette smoke. CXR-06/12/2011-stable post therapy appearance of the chest with nothing new. There is a right hilar/paratracheal mass with surgical clips unchanged from 2010, radiation changes. The widespread pulmonary interstitial opacities which developed over a year ago, are stable and consistent now with scarring.  12/24/11- 62 yo former heavy smoker followed after RUL lobectomy for lung cancer, chronic bronchitis, allergic  rhinitis, recent diffuse interstitial process. Blames pollen for recent tighter chest, mild wheeze, nasal congestion and drainage. Denies fever, pain or blood. Discussed medication refill needs. Educated the difference between Mucinex and antihistamines. Chest x-ray 06/12/2011-reviewed-stable.  06/23/12- 63 yo former heavy smoker followed after RUL lobectomy for lung cancer, chronic bronchitis, allergic rhinitis, recent diffuse interstitial process.  Had flu vaccine Pt c/o increased coughing while showering, pt thinks d/t heat and steam. Pt notices this with severe weather changes as well.  Patient needs printed Rx 30 day supply/refills for Foradil, Xopenex HFA and Neb and Asmanex. Boyfriend still smokes but they got rid of the wood stove. Xopenex has been a big help. Her oncologist will get a followup CT scan but there has been no recurrence of her lung cancer.  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  FOLLOWS FOR: Pt. reports there is no change with her coughing, pt. states she feels stuffy, due to weather change.  Pt. denies any chest tightness or wheezing. Sneezing some from the pollen. Otherwise okay. She has been released for followup by her oncologist after 5 years. medications reviewed. Very sensitive to albuterol-shaky. CT 07/09/12 IMPRESSION:  1. Stable surgical and radiation changes involving the right lung.  No findings for recurrent tumor or pulmonary metastatic disease.  2. 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 Recommend clinical correlation.  Original Report Authenticated By: Marijo Sanes, M.D.  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  FOLLOWS AVW:[id number] is about the same however having slight increase in cough(gets this time of year) [name] better as well. Had flu vaccine. 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  FOLLOWS FOR: Pollen is causing her to have cough and congestion. Head congestion, sneeze, some cough-green. No fever. Likes Breo. CT 06/24/13 IMPRESSION:  Stable postsurgical changes and postradiation changes in right upper  lobe. Surgical clips in right hilum again noted. Again noted status  post right upper thoracotomy. No pulmonary edema. No definite  evidence of superimposed infiltrate or recurrent metastatic disease.  Electronically Signed  By: Lahoma Crocker M.D.  On: 06/24/2013 14:17  Review of Systems-See HPI Constitutional:   No-   weight loss, night sweats, fevers, chills, fatigue, lassitude. HEENT:   No-  headaches, difficulty swallowing, tooth/dental problems, sore throat,      +mild sneezing,no- itching, ear ache, +nasal congestion, post nasal drip,  CV:  No-   chest pain, orthopnea, PND, swelling in lower extremities, anasarca, dizziness, palpitations Resp: No- acute  shortness of breath with exertion or at rest.              No-   productive cough,  little non-productive cough,  No- coughing up of blood.              No-   change in color of mucus.  +Little- wheezing.   Skin: No-   rash or lesions. GI:  No-   heartburn, indigestion, abdominal pain, nausea, vomiting,  GU:   MS:  No-   joint pain or swelling.   Neuro-     nothing unusual Psych:  No- change in mood or affect. No depression or anxiety.  No memory loss.   Objective:   Physical Exam General- Alert, Oriented, Affect-appropriate, Distress- none acute Skin- rash-none, lesions- none, excoriation- none. Always has a somewhat  sunburned look. Lymphadenopathy- none Head- atraumatic            Eyes- Gross vision intact, PERRLA, conjunctivae clear secretions            Ears- Hearing, canals-normal            Nose- Clear, no-Septal dev, mucus, polyps, erosion, perforation             Throat- Mallampati II , mucosa clear , drainage- none, tonsils- atrophic Neck- flexible , trachea midline, no stridor , thyroid nl, carotid no bruit Chest - symmetrical excursion , unlabored           Heart/CV- RRR , no murmur , no gallop  , no rub, nl s1 s2                           - JVD- none , edema- none, stasis changes- none, varices- none           Lung- clear to P&A,  Wheeze- none, cough+with deep breath , dullness-none, rub- none.                           Unlabored           Chest wall-  Abd- Br/ Gen/ Rectal- Not done, not indicated Extrem- cyanosis- none, clubbing, none, atrophy- none, strength- nl Neuro- grossly intact to observation

## 2013-12-23 NOTE — Patient Instructions (Signed)
Refill scripts printed  Script for Zpak in case of bacterial infection  You could try taking an antihistamine/ decongestant like Claritin-D or Allegra-D  12 hour, if needed

## 2013-12-23 NOTE — Assessment & Plan Note (Signed)
Mild exacerbation, ? Part of a URI, or all pollen allergy? Plan- Zpak to hold, Continue regular inhaled meds with refills.

## 2013-12-23 NOTE — Assessment & Plan Note (Signed)
No recurrence Plan- Anticipate 1 year f/u CXR in the fall

## 2013-12-23 NOTE — Assessment & Plan Note (Signed)
Seasonal flare. Discussed anti-histamine/decongestants

## 2014-01-31 ENCOUNTER — Ambulatory Visit (INDEPENDENT_AMBULATORY_CARE_PROVIDER_SITE_OTHER): Payer: Medicare Other | Admitting: Sports Medicine

## 2014-01-31 ENCOUNTER — Encounter: Payer: Self-pay | Admitting: Sports Medicine

## 2014-01-31 VITALS — BP 109/67 | HR 97 | Temp 98.2°F | Ht 65.0 in | Wt 126.8 lb

## 2014-01-31 DIAGNOSIS — Z23 Encounter for immunization: Secondary | ICD-10-CM

## 2014-01-31 DIAGNOSIS — C3490 Malignant neoplasm of unspecified part of unspecified bronchus or lung: Secondary | ICD-10-CM

## 2014-01-31 DIAGNOSIS — F411 Generalized anxiety disorder: Secondary | ICD-10-CM

## 2014-01-31 MED ORDER — ALPRAZOLAM 0.5 MG PO TABS: 0.5000 mg | ORAL_TABLET | Freq: Three times a day (TID) | ORAL | Status: DC | PRN

## 2014-01-31 MED ORDER — TOPIRAMATE 50 MG PO TABS: 75.0000 mg | ORAL_TABLET | Freq: Two times a day (BID) | ORAL | Status: DC

## 2014-01-31 NOTE — Progress Notes (Signed)
  [name] - 48 y.o. female MRN [id number]  Date of birth: June 16, 1966  CC, SUBJECTIVE & ROS:     If applicable, see problem based charting for additional problem specific documentation. Chief Complaint  Patient presents with  . Anxiety    Doing well on current Xanax dose.  No side effects.  Titrated back Topamax to 75 mg twice a day.  Marland Kitchen COPD    needs prevnar   Reports overall doing well.  Trying to go up on her Topamax but had cut back due to dysphoric affect.  Has multiple refills remaining on her Xanax and has been using reportedly 3 per day.  Overall well controlled  She reports her breathing is at baseline.  She is no longer smoking but is around secondhand smoke.   HISTORY: Past Medical, Surgical, Social, and Family History Reviewed & Updated per EMR.  Pertinent Historical Findings include: The patients history is remarkable for: # RESP: Hx of Lung CA s/p resection and chemo/radiation, snoring disorder, recurrent infections including idiopathic pneumonia.  Hx of Asthma with bronchitis  CA in remission currently # PSYCH/Neuro: Panic Attacks; migraine  Stable on Xanax and Topamax Health Care Maintenance:  UpToDate - s/p TVH Brief Social Hx   Former Smoker - quit in 2004 with Dx of Lung CA  Other Specialists: Pulmonology -  Dr. Annamaria Boots      Prior smoker with continued passive smoke exposure   OBJECTIVE:  BP:109/67 mmHg  HR:97bpm  TEMP:98.2 F (36.8 C)(Oral)  RESP:   HT:5\' 5"  (165.1 cm)   WT:126 lb 12.8 oz (57.516 kg)  BMI:21.1 Physical Exam GENERAL:  Adult, thin Caucasian female. In no discomfort; no respiratory distress HNEENT:  mmm, no JVD CARDIAC:  RRR, S1/S2 heard, no murmur LUNGS:  CTA B, no wheezes, no crackles ABDOMEN:   EXTREM:  Warm, well perfused.  Moves all 4 extremities spontaneously; no lateralization.  Pedal pulses 1/4.  0 pretibial edema. PSYCH Exam: Eye Contact:   Moderate Speech:  Somewhat pressured but appropriate volume Mood:    good Affect:   Slightly anxious SI/HI:   None Orientation:    Knowledge:    Average Thought Proc:   Thought Cont:   Judgement:  Moderate Insight:  Moderate ---- Psychomotor: Slight psychomotor agitation.  Constantly rearranging things in the room Akathisia:  Non   ASSESSMENT & PLAN: See problem based charting & AVS for pt instructions.

## 2014-01-31 NOTE — Assessment & Plan Note (Addendum)
Chronic condition  - doing well, needs pneumococcal 23 1. Continue current regimen. 2. Pneumovax today  3. Encouraged continued smoking cessation and avoidance of secondhand smoke

## 2014-01-31 NOTE — Assessment & Plan Note (Signed)
Chronic condition  - well-controlled on current Xanax dose 1. Refill  Xanax    2. New prescription today for Topamax 75 mg by mouth twice a day.  Patient did not tolerate prior higher dose and reports overall feeling better.

## 2014-04-11 ENCOUNTER — Telehealth: Payer: Self-pay | Admitting: Internal Medicine

## 2014-04-11 MED ORDER — ALBUTEROL SULFATE (2.5 MG/3ML) 0.083% IN NEBU: 2.5000 mg | INHALATION_SOLUTION | Freq: Four times a day (QID) | RESPIRATORY_TRACT | Status: DC | PRN

## 2014-04-11 MED ORDER — ALBUTEROL SULFATE HFA 108 (90 BASE) MCG/ACT IN AERS: INHALATION_SPRAY | RESPIRATORY_TRACT | Status: DC

## 2014-04-11 NOTE — Telephone Encounter (Signed)
Remove xopenex HFA and replace with albuterol HFA rescue inhaler, # 1, 2 puffs every 4 hours if needed, refill prn  Remove Xopenex neb solution and replace with albuterol 0.083% neb solution, # 75 ampules, 1 every 6 hours if needed  refill prn

## 2014-04-11 NOTE — Telephone Encounter (Signed)
Called and spoke with pt and she stated that her insurance will no longer cover the xopenex HFA but will cover the proair hfa.  Ok to change this for the pt? Also the xopenex for her nebulizer is not covered by her insurance and pt is not sure what meds will be covered.  Pt requesting recs from [facility].  Please advise.   Last ov--12/23/13 Next ov--06/24/14  No Known Allergies  Current Outpatient Prescriptions on File Prior to Visit  Medication Sig Dispense Refill  . ALPRAZolam (XANAX) 0.5 MG tablet Take 1 tablet (0.5 mg total) by mouth 3 (three) times daily as needed for anxiety.  90 tablet  3  . azithromycin (ZITHROMAX) 250 MG tablet 2 today then one daily  6 each  0  . Fluticasone Furoate-Vilanterol (BREO ELLIPTA) 100-25 MCG/INH AEPB Inhale 1 puff into the lungs daily. Rinse after each use  1 each  prn  . levalbuterol (XOPENEX HFA) 45 MCG/ACT inhaler Inhale 2 puffs into the lungs every 6 (six) hours as needed for wheezing or shortness of breath.  1 Inhaler  prn  . levalbuterol (XOPENEX) 0.63 MG/3ML nebulizer solution Take 3 mLs (0.63 mg total) by nebulization every 4 (four) hours as needed for wheezing or shortness of breath.  75 mL  prn  . topiramate (TOPAMAX) 50 MG tablet Take 1.5 tablets (75 mg total) by mouth 2 (two) times daily.  90 tablet  3   No current facility-administered medications on file prior to visit.

## 2014-04-11 NOTE — Telephone Encounter (Signed)
i called and lmom x 1 on the pts VM to make her aware of [facility] recs for the xopenex.  This has been sent to the pharmacy and nothing further is needed.

## 2014-04-11 NOTE — Telephone Encounter (Signed)
LMOM TCB x1 Pt has Xopenex HFA and nebs on her med list  Per the 5.27.2011 ov: Jan 26, 2010- Asthmatic bronchitis/ COPD, hx lung CA/ RUL/ 2003  Complains of pollen rhinitis- sneeze,drip- yellow.. Not yet needing antihistamine.  Has been outside alot in sun- discused skin protection.  Wheeze 2x/week. Dyspnea with exertion stairs, low inclines, long parking lot- remains stable. No formal exercise. Walks some with daughter 50 yo. Can't tolerate albuterol rescue inhalers- tremor and overstimulation. Asks we continue Xopenex HFA. SABA 2x/ week. Denies nodes, lumps, blood, chest pain, palpitation. Due for f/u CXR w/ Dr Arlyce Dice for cancer f/u.

## 2014-06-24 ENCOUNTER — Ambulatory Visit (INDEPENDENT_AMBULATORY_CARE_PROVIDER_SITE_OTHER): Payer: Medicare Other | Admitting: Internal Medicine

## 2014-06-24 ENCOUNTER — Encounter: Payer: Self-pay | Admitting: Internal Medicine

## 2014-06-24 ENCOUNTER — Ambulatory Visit (INDEPENDENT_AMBULATORY_CARE_PROVIDER_SITE_OTHER)
Admission: RE | Admit: 2014-06-24 | Discharge: 2014-06-24 | Disposition: A | Discharge status: Home / Self Care | Payer: Medicare Other | Source: Ambulatory Visit | Attending: Internal Medicine | Admitting: Internal Medicine

## 2014-06-24 VITALS — BP 90/62 | HR 94 | Ht 65.0 in | Wt 132.4 lb

## 2014-06-24 DIAGNOSIS — Z85118 Personal history of other malignant neoplasm of bronchus and lung: Secondary | ICD-10-CM

## 2014-06-24 DIAGNOSIS — Z23 Encounter for immunization: Secondary | ICD-10-CM

## 2014-06-24 DIAGNOSIS — J4541 Moderate persistent asthma with (acute) exacerbation: Secondary | ICD-10-CM

## 2014-06-24 MED ORDER — LEVALBUTEROL HCL 0.63 MG/3ML IN NEBU
0.6300 mg | INHALATION_SOLUTION | Freq: Once | RESPIRATORY_TRACT | Status: AC
  Administered 2014-06-24: 0.63 mg via RESPIRATORY_TRACT

## 2014-06-24 MED ORDER — AZITHROMYCIN 250 MG PO TABS: ORAL_TABLET | ORAL | Status: DC

## 2014-06-24 MED ORDER — PROMETHAZINE-CODEINE 6.25-10 MG/5ML PO SYRP: 5.0000 mL | ORAL_SOLUTION | Freq: Four times a day (QID) | ORAL | Status: DC | PRN

## 2014-06-24 MED ORDER — ALBUTEROL SULFATE HFA 108 (90 BASE) MCG/ACT IN AERS: INHALATION_SPRAY | RESPIRATORY_TRACT | Status: DC

## 2014-06-24 MED ORDER — FLUTICASONE FUROATE-VILANTEROL 100-25 MCG/INH IN AEPB: 1.0000 | INHALATION_SPRAY | Freq: Every day | RESPIRATORY_TRACT | Status: DC

## 2014-06-24 MED ORDER — METHYLPREDNISOLONE ACETATE 80 MG/ML IJ SUSP
80.0000 mg | Freq: Once | INTRAMUSCULAR | Status: AC
  Administered 2014-06-24: 80 mg via INTRAMUSCULAR

## 2014-06-24 NOTE — Patient Instructions (Addendum)
Flu vax  Neb xop 0.63  Depo 75  Script for cough syrup  Refill script for [employer] for Z pak  Order CXR  Dx Acute bronchitis, hx lung cancer

## 2014-06-24 NOTE — Progress Notes (Signed)
Patient ID: [name], female    DOB: Nov 07, 1965, 48 y.o.   MRN: [id number]  HPI 48 yo former heavy smoker followed after RUL lobectomy for lung cancer, chronic bronchitis, allergic rhinitis, recent diffuse interstitial process. Dr Jana Hakim recently saw her for Oncology f/u and repeated CT 11/09/10 - images reviewed. There has been a decrease in the numerous cavitating nodular infiltrates since November comparison. This improved with no treatment. She has seen Dr Arlyce Dice today.  She has been sunning herself, lying outside and blames seasonal pollen for what she considers normal for season mild chest congestion with scant light yellow phlegm. Denies chest pain, fever, night sweats, nodes. Recent PPD skin test was negative. HIV serology was negative. She had chosen to defer bronchoscopy discussed at last visit, because she felt well despite CXR.  02/12/11-  Feels ok. Had a chest cold a couple of weeks ago- largely resolved. Otherwise has felt well. Boyfriend still smokes heavily around her-  makes her cough. Otherwise not noting routine cough, chest pain, palpitation, fever.  06/25/11-48 yo former heavy smoker followed after RUL lobectomy for lung cancer, chronic bronchitis, allergic rhinitis, recent diffuse interstitial process. Cooler air of the fall season and humid air in the shower both cause her chest to tighten. Dr. Arlyce Dice has now released her from his long-term followup. She is coughing and wheezing very little and feels stable. She is still exposed to her boyfriend's cigarette smoke. CXR-06/12/2011-stable post therapy appearance of the chest with nothing new. There is a right hilar/paratracheal mass with surgical clips unchanged from 2010, radiation changes. The widespread pulmonary interstitial opacities which developed over a year ago, are stable and consistent now with scarring.  12/24/11- 48 yo former heavy smoker followed after RUL lobectomy for lung cancer, chronic bronchitis, allergic  rhinitis, recent diffuse interstitial process. Blames pollen for recent tighter chest, mild wheeze, nasal congestion and drainage. Denies fever, pain or blood. Discussed medication refill needs. Educated the difference between Mucinex and antihistamines. Chest x-ray 06/12/2011-reviewed-stable.  06/23/12- 48 yo former heavy smoker followed after RUL lobectomy for lung cancer, chronic bronchitis, allergic rhinitis, recent diffuse interstitial process.  Had flu vaccine Pt c/o increased coughing while showering, pt thinks d/t heat and steam. Pt notices this with severe weather changes as well.  Patient needs printed Rx 30 day supply/refills for Foradil, Xopenex HFA and Neb and Asmanex. Boyfriend still smokes but they got rid of the wood stove. Xopenex has been a big help. Her oncologist will get a followup CT scan but there has been no recurrence of her lung cancer.  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  FOLLOWS FOR: Pt. reports there is no change with her coughing, pt. states she feels stuffy, due to weather change.  Pt. denies any chest tightness or wheezing. Sneezing some from the pollen. Otherwise okay. She has been released for followup by her oncologist after 5 years. medications reviewed. Very sensitive to albuterol-shaky. CT 07/09/12 IMPRESSION:  1. Stable surgical and radiation changes involving the right lung.  No findings for recurrent tumor or pulmonary metastatic disease.  2. 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 Recommend clinical correlation.  Original Report Authenticated By: Marijo Sanes, M.D.  06/24/13- 48 yo former heavy smoker followed after RUL lobectomy for lung cancer, chronic bronchitis, allergic rhinitis,  diffuse interstitial  process  (? Histiocytosis X).  FOLLOWS STM:[id number] is about the same however having slight increase in cough(gets this time of year) [name] better as well. Had flu vaccine. 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  FOLLOWS FOR: Pollen is causing her to have cough and congestion. Head congestion, sneeze, some cough-green. No fever. Likes Breo. CT 06/24/13 IMPRESSION:  Stable postsurgical changes and postradiation changes in right upper  lobe. Surgical clips in right hilum again noted. Again noted status  post right upper thoracotomy. No pulmonary edema. No definite  evidence of superimposed infiltrate or recurrent metastatic disease.  Electronically Signed  By: Lahoma Crocker M.D.  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  FOLLOW FOR:  Asthma w/ Bronchitis; sinus pressure; cough, would like cough medication to help her sleep at night Acute- 3 days- cough green, sinus drainage, ? fever  Review of Systems-See HPI Constitutional:   No-   weight loss, night sweats, fevers, chills, fatigue, lassitude. HEENT:   No-  headaches, difficulty swallowing, tooth/dental problems, sore throat,      +mild sneezing,no- itching, ear ache, +nasal congestion, post nasal drip,  CV:  No-   chest pain, orthopnea, PND, swelling in lower extremities, anasarca, dizziness, palpitations Resp: No- acute  shortness of breath with exertion or at rest.             + productive cough,  little non-productive cough,  No- coughing up of blood.              + change in color of mucus.  +Little- wheezing.   Skin: No-   rash or lesions. GI:  No-   heartburn, indigestion, abdominal pain, nausea,  vomiting,  GU:   MS:  No-   joint pain or swelling.   Neuro-     nothing unusual Psych:  No- change in mood or affect. No depression or anxiety.  No memory loss.   Objective:   Physical Exam General- Alert, Oriented, Affect-appropriate, Distress- none acute Skin- rash-none, lesions- none, excoriation- none. Always has a somewhat sunburned look. Lymphadenopathy- none Head- atraumatic            Eyes- Gross vision intact, PERRLA, conjunctivae clear secretions            Ears- Hearing, canals-normal            Nose- stuffy +, no-Septal dev, mucus, polyps, erosion, perforation             Throat- Mallampati II , mucosa clear , drainage- none, tonsils- atrophic Neck- flexible , trachea midline, no stridor , thyroid nl, carotid no bruit Chest - symmetrical excursion , unlabored           Heart/CV- RRR , no murmur , no gallop  , no rub, nl s1 s2                           - JVD- none , edema- none, stasis changes- none, varices- none           Lung- rhonchi + Left,  Wheeze- none, cough+active , dullness-none, rub- none.  Unlabored           Chest wall-  Abd- Br/ Gen/ Rectal- Not done, not indicated Extrem- cyanosis- none, clubbing, none, atrophy- none, strength- nl Neuro- grossly intact to observation

## 2014-06-26 NOTE — Assessment & Plan Note (Signed)
Acute bronchitis Plan- cough syrup, Zpak. CXR, fluids

## 2014-07-01 ENCOUNTER — Other Ambulatory Visit: Payer: Self-pay | Admitting: Internal Medicine

## 2014-07-01 DIAGNOSIS — J42 Unspecified chronic bronchitis: Secondary | ICD-10-CM

## 2014-07-01 NOTE — Progress Notes (Signed)
Quick Note:  Called and spoke to pt. Informed pt of the results and recs per [facility]. Order placed for CXR. Pt verbalized understanding and denied any further questions or concern at this time. ______

## 2014-08-15 ENCOUNTER — Ambulatory Visit (INDEPENDENT_AMBULATORY_CARE_PROVIDER_SITE_OTHER): Payer: Medicare Other | Admitting: Family Medicine

## 2014-08-15 ENCOUNTER — Other Ambulatory Visit: Payer: Self-pay | Admitting: Family Medicine

## 2014-08-15 ENCOUNTER — Encounter: Payer: Self-pay | Admitting: Family Medicine

## 2014-08-15 VITALS — BP 103/82 | HR 104 | Temp 98.2°F | Ht 65.0 in | Wt 128.0 lb

## 2014-08-15 DIAGNOSIS — J4541 Moderate persistent asthma with (acute) exacerbation: Secondary | ICD-10-CM

## 2014-08-15 DIAGNOSIS — G43909 Migraine, unspecified, not intractable, without status migrainosus: Secondary | ICD-10-CM

## 2014-08-15 DIAGNOSIS — F411 Generalized anxiety disorder: Secondary | ICD-10-CM

## 2014-08-15 MED ORDER — TOPIRAMATE 50 MG PO TABS
75.0000 mg | ORAL_TABLET | Freq: Two times a day (BID) | ORAL | Status: DC
Start: 2014-08-15 — End: 2014-12-01

## 2014-08-15 MED ORDER — ALPRAZOLAM 0.5 MG PO TABS: 0.5000 mg | ORAL_TABLET | Freq: Three times a day (TID) | ORAL | Status: DC | PRN

## 2014-08-15 MED ORDER — AZITHROMYCIN 250 MG PO TABS: ORAL_TABLET | ORAL | Status: DC

## 2014-08-15 NOTE — Assessment & Plan Note (Signed)
Patient with h/o lung cancer.  Recently seen by pulm and given Zpak in Oct.  Patient with no red flags at present but concerning CXR.  Patient states she is to undergo repeat CXR next week with Pulmonologist.  Last radiologist note recommending CT of chest.  As patient is experiencing similar symptoms to pulm visit, I agreed to refill Zpak x1 with STRONG recommendation for close followup with Pulm. -Zpak -Patient to follow up with Pulm -Patient voices good understanding of plan -patient to follow up if symptoms worsen

## 2014-08-15 NOTE — Progress Notes (Signed)
Patient ID: [name], female   DOB: 06-Jun-1966, 48 y.o.   MRN: [id number]    Subjective: CC: "croupy chest" HPI: Patient is a 48 y.o. female presenting to clinic today for medication refills. Concerns today include:  1. Chest congestion/cough Patient reports that she has had chest congestion and a productive cough for about 2 months.  She was seen by Pulmonologist in October, who prescribed Zpak and cough medicine.  This improved her symptoms for a couple weeks then she said symptoms have returned.  Of note, patient also had a CXR done at that time which was had an extensive DDx including but not limited to: indolent infection with MAC vs bronchitis vs bronchiolitis vs interstitial dz.  She denies fevers, weight loss, fatigue, SOB outside of normal, CP, coughing up blood.  2.  Anxiety Patient reports that her anxiety has not been well controlled since Dr Paulla Fore decreased her Xanax to 0.5mg  TID.  She was previously on 1.5mg  TID per patient.  She states that she has used other medications in the past with no relief.  She wants to increase this medication again.  3. Migraines Patient reports that migraine headaches have been well controlled on Topamax.  She denies any recent headaches, n/v, photophobia, phonophobia.  Compliant with medication.   History Reviewed: non/former smoker. Health Maintenance: Patient needs mammogram and colonoscopy.  ROS: All other systems reviewed and are negative.  Objective: Office vital signs reviewed. BP 103/82 mmHg  Pulse 104  Temp(Src) 98.2 F (36.8 C) (Oral)  Ht 5\' 5"  (1.651 m)  Wt 128 lb (58.06 kg)  BMI 21.30 kg/m2  Physical Examination:  General: Awake, alert, thin female, NAD HEENT: Atraumatic, normocephalic, EOMI    Nose: nasal turbinates moist Cardio: RRR, S1S2 heard, no murmurs appreciated Pulm: CTAB, no wheezes, rhonchi or rales, no increased WOB MSK: Normal gait and station Skin: dry, intact, no rashes or lesions Psych: slightly  pressured speech, mood stable, affect appropriate, no SI or HI  GAD-7: 18 PHQ-9: 2  Assessment: 48 y.o. female with anxiety, migraines and a h/o of lung cancer with cough  Plan: See Problem List and After Visit Summary   [name], DO PGY-1, [facility]

## 2014-08-15 NOTE — Assessment & Plan Note (Signed)
Patient on Topamax daily with good results -refill topamax x4 months -f/u PRN

## 2014-08-15 NOTE — Assessment & Plan Note (Signed)
GAD-7: (18), PHQ-9: (2).  Patient on Xanax 0.5mg  TID.  Patient has seen Dr Gwenlyn Saran in the past. -Counseled patient at length regarding chronic use of benzodiazepines. -Recommend that patient seek psychiatric treatment if adamant about increasing benzo doses -Patient declined SSRI  -Refilled current dose of Xanax -Patient agrees to seek out Psychiatrist.  List given.  [employer] given.  Patient to consider [facility].

## 2014-08-15 NOTE — Patient Instructions (Signed)
It was a pleasure seeing you today!  Information regarding what we discussed is included in this packet.  Please feel free to call our office if any questions or concerns arise.  I'm giving you prescriptions for renewals on your Xanax and Topamax and prescribing you a Zpak.  Please follow up with your pulmonologist as directed for additional imaging.  Also, I've made a referral to psychiatry for your anxiety.    Please schedule an appointment to have the spot on your skin biopsied and a follow up with me in 3 months.  Ashly M. Lajuana Ripple, DO PGY-1, [place]

## 2014-08-19 ENCOUNTER — Ambulatory Visit (INDEPENDENT_AMBULATORY_CARE_PROVIDER_SITE_OTHER)
Admission: RE | Admit: 2014-08-19 | Discharge: 2014-08-19 | Disposition: A | Discharge status: Home / Self Care | Payer: Medicare Other | Source: Ambulatory Visit | Attending: Internal Medicine | Admitting: Internal Medicine

## 2014-08-19 DIAGNOSIS — J42 Unspecified chronic bronchitis: Secondary | ICD-10-CM

## 2014-09-06 ENCOUNTER — Ambulatory Visit (INDEPENDENT_AMBULATORY_CARE_PROVIDER_SITE_OTHER): Payer: Medicare Other | Admitting: Family Medicine

## 2014-09-06 ENCOUNTER — Other Ambulatory Visit: Payer: Self-pay | Admitting: Family Medicine

## 2014-09-06 ENCOUNTER — Encounter: Payer: Self-pay | Admitting: Family Medicine

## 2014-09-06 VITALS — BP 107/72 | HR 99 | Temp 97.7°F | Ht 65.0 in | Wt 129.0 lb

## 2014-09-06 DIAGNOSIS — F411 Generalized anxiety disorder: Secondary | ICD-10-CM

## 2014-09-06 DIAGNOSIS — C4371 Malignant melanoma of right lower limb, including hip: Secondary | ICD-10-CM | POA: Insufficient documentation

## 2014-09-06 DIAGNOSIS — L989 Disorder of the skin and subcutaneous tissue, unspecified: Secondary | ICD-10-CM

## 2014-09-06 MED ORDER — CITALOPRAM HYDROBROMIDE 20 MG PO TABS: 20.0000 mg | ORAL_TABLET | Freq: Every day | ORAL | Status: DC

## 2014-09-06 NOTE — Progress Notes (Signed)
Patient ID: [name], female   DOB: 1966-07-16, 49 y.o.   MRN: [id number]  **Procedure note**  Written consent was obtained for punch biopsy.  Procedure was supervised by Dr McDiarmid.  Procedure was performed in a sterilized fashion.  Biopsy area was sterilized with iodine.  Lidocaine with epinephrine was injected x4 around the biopsy site.  A 4 mm punch biopsy kit was used and a small section at the superior aspect of the lesion was obtained.  Minimal blood loss.  Wound was cleaned, silver nitrate swabs were used to stop bleeding, and wound dressed.  Home care instructions were reviewed with patient.  A copy was also sent with patient in AVS.  Specimen is to be sent of for pathology review.  Ashly M. Lajuana Ripple, DO PGY-1, [place]

## 2014-09-06 NOTE — Assessment & Plan Note (Signed)
Punch biopsy performed. -Home care instructions in AVS sent with patient -Specimen sent for pathology review -Will contact patient with results of bx

## 2014-09-06 NOTE — Patient Instructions (Signed)
It was a pleasure seeing you today!  Information regarding what we discussed is included in this packet.  Please feel free to call our office if any questions or concerns arise.  See me back in 3-4 weeks.  Mychelle Kendra M. Lajuana Ripple, DO PGY-1, [facility]   Incision Care  An incision is a surgical cut to open your skin. You need to take care of your incision. This helps you to not get an infection. HOME CARE  Only take medicine as told by your doctor.  Do not take off your bandage (dressing) or get your incision wet until your doctor approves. Change the bandage and call your doctor if the bandage gets wet, dirty, or starts to smell.  Take showers. Do not take baths, swim, or do anything that may soak your incision until it heals.  Return to your normal diet and activities as told or allowed by your doctor.  Avoid lifting any weight until your doctor approves.  Put medicine that helps lessen itching on your incision as told by your doctor. Do not pick or scratch at your incision.  Keep your doctor visit to have your stitches (sutures) or staples removed.  Drink enough fluids to keep your pee (urine) clear or pale yellow. GET HELP RIGHT AWAY IF:  You have a fever.  You have a rash.  You are dizzy, or you pass out (faint) while standing.  You have trouble breathing.  You have a reaction or side effects to medicine given to you.  You have redness, puffiness (swelling), or more pain in the incision and medicine does not help.  You have fluid, blood, or yellowish-white fluid (pus) coming from the incision lasting over 1 day.  You have muscle aches, chills, or you feel sick.  You have a bad smell coming from the incision or bandage.  Your incision opens up after stitches, staples, or sticky strips have been removed.  You keep feeling sick to your stomach (nauseous) or keep throwing up (vomiting). MAKE SURE YOU:   Understand these instructions.  Will watch your  condition.  Will get help right away if you are not doing well or get worse. Document Released: 11/11/2011 Document Reviewed: 10/13/2013 [facility] Patient Information 2015 [place]. This information is not intended to replace advice given to you by your health care provider. Make sure you discuss any questions you have with your health care provider.

## 2014-09-06 NOTE — Assessment & Plan Note (Signed)
-  Continue Xanax as prescribed -Patient to seek out psychiatrist -Begin celexa 20mg  -Patient to return in 2-4 weeks for follow up.

## 2014-09-06 NOTE — Progress Notes (Signed)
Patient reports to me today at her skin biopsy appointment that she is interested in taking the SSRI we discussed at last visit for anxiety disorder.  She is adamant about not starting Zoloft.  She reports that she still has not scheduled an appointment with a psychiatrist but voices to me that she agrees that increasing her benzo is not the answer to helping her anxiety long term.  Will begin Celexa at 20mg .  Patient with hysterectomy, no possibility of pregnancy.  Side effects, risks and benefits discussed with patient.  Lori M. Lajuana Ripple, DO PGY-1, [place]

## 2014-09-09 ENCOUNTER — Other Ambulatory Visit: Payer: Self-pay | Admitting: Family Medicine

## 2014-09-09 ENCOUNTER — Encounter: Payer: Self-pay | Admitting: Family Medicine

## 2014-09-09 DIAGNOSIS — C4371 Malignant melanoma of right lower limb, including hip: Secondary | ICD-10-CM

## 2014-09-09 NOTE — Addendum Note (Signed)
Addended by: [name] on: 09/09/2014 12:44 PM   Modules accepted: Level of Service

## 2014-09-09 NOTE — Progress Notes (Signed)
Patient ID: [name], female   DOB: 1965-10-26, 49 y.o.   MRN: [id number]    Subjective: YH:CWCBJS on RLE HPI: Patient is a 49 y.o. female presenting to clinic today for lesion on RLE. Concerns today include:  1. RLE mole Patient reports that she has a mole on her RLE that her mother noticed in December seemed to be changing colors.  Patient does report today that she thinks that it was getting a little bigger.  She denies bleeding but does state that it tends to flake.  She does not remember what color the mole originally was.  Denies pain, weight loss, fatigue or chills.  ROS: All other systems reviewed and are negative. Negative pain, weight loss, fatigue or chills.  Objective: Office vital signs reviewed. BP 107/72 mmHg  Pulse 99  Temp(Src) 97.7 F (36.5 C) (Oral)  Ht 5\' 5"  (1.651 m)  Wt 129 lb (58.514 kg)  BMI 21.47 kg/m2  Physical Examination:  General: Awake, alert, well nourished, NAD Extremities: WWP, No edema, cyanosis or clubbing; +2 pulses bilaterally Skin: dry, intact  RLE: posterior aspect of lower leg with 0.25cm x 0.5cm slightly raised lesion with irregular borders and an area of darkening in the superior aspect of lesion, no flaking or bleeding appreciated.  Lesion is non-tender.  Assessment: 49 y.o. female with a skin lesion of R LE.  Plan: See Problem List and After Visit Summary   [name], DO PGY-1, [place]

## 2014-09-09 NOTE — Progress Notes (Unsigned)
Bx results consistent with Malignant Melanoma.  Called patient and patient asked that I discuss results over phone rather than come in for an appointment.  As removal of lesion is urgent, I agreed to this.  I explained to patient the urgency of seeing dermatology for removal of lesion and full body exam for any other suspicious lesions requiring further investigation.  Patient voices good understanding of the high risk associated with Malignant Melanoma.  She understands that she must see the Dermatologist ASAP.  Patient will be awaiting call for scheduling.   Bx results: MALIGNANT MELANOMA MELANOMA TABLEPROCEDURE: PUNCH SPECIMEN INCLUDING LATERALITY (IF SPECIFIED)/ANATOMIC SITE: RIGHT POSTERIOR CALF HISTOLOGIC TYPE: NOT SPECIFIED. MAXIMUM TUMOR THICKNESS: ABOUT 0.33 MM ANATOMIC LEVEL: III ULCERATION: NOT SEEN. MARGINS PERIPHERAL MARGINS: POSITIVE DEEP MARGIN: FREE MITOTIC INDEX: LESS THAN 1 PER MILLIMETER SQUARED LYMPH-VASCULAR INVASION: NOT SEEN. TUMOR-INFILTRATING LYMPHOCYTES: BRISK TUMOR REGRESSION: ACROSS FULL ASPECT OF PUNCH BIOPSY. LYMPH NODES (IF APPLICABLE): N/A PATHOLOGIC STAGE: PT1A, NX, MX Microscopic Description See melanoma table. Deeper levels are similar. Given the punch nature of the specimen though I am suspicious this is a superficial spreading melanoma, only limited intraepidermal disease is noted and reclassification maybe necessary upon evaluation of the excision. MITF and Melan-A staining confirm the melanocytic origin of the neoplasm. Dr. Cristina Gong has about the same opinion. (BJS:gt, 09/08/14)  [name]. Lajuana Ripple, DO PGY-1, [place]

## 2014-09-13 ENCOUNTER — Other Ambulatory Visit: Payer: Self-pay | Admitting: Dermatology

## 2014-09-14 ENCOUNTER — Telehealth: Payer: Self-pay | Admitting: Family Medicine

## 2014-09-14 NOTE — Telephone Encounter (Signed)
Called patient to f/u on how procedure with Derm went yesterday.  Patient reports everything went well.  She is a little sore today but otherwise tolerated procedure.  She has f/u in 2 weeks with Derm.  Will schedule to see me shortly after.  Patient voices appreciation for call.  Ashly M. Lajuana Ripple, DO PGY-1, [place]

## 2014-10-04 ENCOUNTER — Telehealth: Payer: Self-pay | Admitting: *Deleted

## 2014-10-04 NOTE — Telephone Encounter (Signed)
Message received from [place] at  [facility] - stating Dr Sarajane Jews would like pt to be seen ASAP to follow up on new melanoma diagnosis.  Per Jeral Pinch states " area on leg at biopsy at max reading of 0.3 and at excision increased to 1.15." " Dr Sarajane Jews says the margins are clear but still pt seen before scheduled appointment in May for any additional recommendations "  Return call number given as [phone] option 2.  THIS NOTE WILL BE GIVEN TO MD UPON RETURN TO THE OFFICE FOR FOLLOW UP AND APPROPRIATE SCHEDULING OF APPOINTMENT.

## 2014-10-06 ENCOUNTER — Other Ambulatory Visit: Payer: Self-pay | Admitting: Oncology

## 2014-10-07 ENCOUNTER — Telehealth: Payer: Self-pay

## 2014-10-07 ENCOUNTER — Telehealth: Payer: Self-pay | Admitting: Oncology

## 2014-10-07 NOTE — Telephone Encounter (Signed)
[name] from [facility] stated Dr Sarajane Jews requested that Dr Jana Hakim see pt for f/u of new R posterior calf melanoma. See phone note from 2/2. POF to scheduler. Dr Jana Hakim last saw pt 07/16/12. Requested notes from Dr Sarajane Jews. This note routed to [facility] and Dr Jana Hakim.

## 2014-10-07 NOTE — Telephone Encounter (Signed)
Patient confirmed appointment for March. Mailed calendar.

## 2014-10-22 ENCOUNTER — Other Ambulatory Visit: Payer: Self-pay | Admitting: Oncology

## 2014-10-22 DIAGNOSIS — C342 Malignant neoplasm of middle lobe, bronchus or lung: Secondary | ICD-10-CM

## 2014-11-07 ENCOUNTER — Other Ambulatory Visit (HOSPITAL_BASED_OUTPATIENT_CLINIC_OR_DEPARTMENT_OTHER): Payer: Medicare Other

## 2014-11-07 ENCOUNTER — Telehealth: Payer: Self-pay | Admitting: Oncology

## 2014-11-07 ENCOUNTER — Ambulatory Visit (HOSPITAL_BASED_OUTPATIENT_CLINIC_OR_DEPARTMENT_OTHER): Payer: Medicare Other | Admitting: Oncology

## 2014-11-07 ENCOUNTER — Encounter: Payer: Self-pay | Admitting: Internal Medicine

## 2014-11-07 ENCOUNTER — Other Ambulatory Visit: Payer: Self-pay | Admitting: Oncology

## 2014-11-07 VITALS — BP 97/51 | HR 114 | Temp 97.6°F | Resp 17 | Ht 65.0 in | Wt 130.7 lb

## 2014-11-07 DIAGNOSIS — Z1231 Encounter for screening mammogram for malignant neoplasm of breast: Secondary | ICD-10-CM

## 2014-11-07 DIAGNOSIS — C4371 Malignant melanoma of right lower limb, including hip: Secondary | ICD-10-CM

## 2014-11-07 DIAGNOSIS — C342 Malignant neoplasm of middle lobe, bronchus or lung: Secondary | ICD-10-CM

## 2014-11-07 DIAGNOSIS — Z85118 Personal history of other malignant neoplasm of bronchus and lung: Secondary | ICD-10-CM

## 2014-11-07 DIAGNOSIS — K921 Melena: Secondary | ICD-10-CM

## 2014-11-07 LAB — CBC WITH DIFFERENTIAL/PLATELET
BASO%: 0.8 % (ref 0.0–2.0)
Basophils Absolute: 0.1 10*3/uL (ref 0.0–0.1)
EOS%: 1.6 % (ref 0.0–7.0)
Eosinophils Absolute: 0.1 10*3/uL (ref 0.0–0.5)
HEMATOCRIT: 41.8 % (ref 34.8–46.6)
HGB: 14.2 g/dL (ref 11.6–15.9)
LYMPH%: 17.3 % (ref 14.0–49.7)
MCH: 29.6 pg (ref 25.1–34.0)
MCHC: 34 g/dL (ref 31.5–36.0)
MCV: 87.1 fL (ref 79.5–101.0)
MONO#: 0.5 10*3/uL (ref 0.1–0.9)
MONO%: 6 % (ref 0.0–14.0)
NEUT#: 5.8 10*3/uL (ref 1.5–6.5)
NEUT%: 74.3 % (ref 38.4–76.8)
Platelets: 236 10*3/uL (ref 145–400)
RBC: 4.8 10*6/uL (ref 3.70–5.45)
RDW: 13.2 % (ref 11.2–14.5)
WBC: 7.8 10*3/uL (ref 3.9–10.3)
lymph#: 1.4 10*3/uL (ref 0.9–3.3)

## 2014-11-07 LAB — COMPREHENSIVE METABOLIC PANEL (CC13)
ALBUMIN: 3.8 g/dL (ref 3.5–5.0)
ALT: 17 U/L (ref 0–55)
AST: 22 U/L (ref 5–34)
Alkaline Phosphatase: 72 U/L (ref 40–150)
Anion Gap: 9 mEq/L (ref 3–11)
BUN: 8.7 mg/dL (ref 7.0–26.0)
CALCIUM: 8.9 mg/dL (ref 8.4–10.4)
CHLORIDE: 109 meq/L (ref 98–109)
CO2: 24 mEq/L (ref 22–29)
CREATININE: 0.8 mg/dL (ref 0.6–1.1)
EGFR: 87 mL/min/{1.73_m2} — ABNORMAL LOW (ref >90)
Glucose: 75 mg/dl (ref 70–140)
POTASSIUM: 3.6 meq/L (ref 3.5–5.1)
SODIUM: 142 meq/L (ref 136–145)
TOTAL PROTEIN: 7.5 g/dL (ref 6.4–8.3)
Total Bilirubin: 0.45 mg/dL (ref 0.20–1.20)

## 2014-11-07 LAB — LACTATE DEHYDROGENASE (CC13): LDH: 163 U/L (ref 125–245)

## 2014-11-07 NOTE — Progress Notes (Signed)
[place]  [phone] Fax:([phone]) [phone]     ID: [name] DOB: Jan 14, 1966  MR#: [id number]  MKL#:[id number]  Patient Care Team: [name], DO as PCP - General Deneise Lever, MD (Pulmonary Disease) PCP: [name], DO GYN: SU:  OTHER MD: Griselda Miner M.D., Rob Hickman M.D., Georganna Skeans MD  CHIEF COMPLAINT: malignant melanoma  CURRENT TREATMENT: awaiting further evaluation   HISTORY OF PRESENT ILLNESS:  I know [name] from her prior history of lung carcinoma, which has resolved. More recently, her significant other [name] noticed a lesion in her right posterior calf. He keeps a picture Atlas of melanoma lesions on his bathroom wall, and told her he was "99% sure" the lesion there would be a melanoma. The patient brought it to Dr. Marjean Donna attention and a punch biopsy was obtained 09/06/2014. This showed ([id number]) malignant melanoma, with a maximum thickness of 0.33 mm, anatomic level III, no ulceration, with negative deep margins but clear peripheral margins. The mitotic index was low, less than one per square millimeter. There was no evidence of lymphovascular invasion. There was brisk tumor infiltrating lymphocytes. Tumor regression was noted across the full aspect of the punch biopsy.   This was read as a pathologic stage TIa, and the patient was referred to Dr. Sarajane Jews for wide excision, performed 09/13/2014. Dr. Everett Graff note indicates a 1 cm margin around the initial biopsy site. The final pathology ([id number]) showed a tumor thickness of 1.15, anatomic level III, with ulceration seen only in the area of prior biopsy, (and therefore read as negative). Peripheral as well as deep margins were free. Again the mitotic index was low there was no evidence of vascular invasion and tumor infiltrating lymphocytes were brisk. This was read as a pT2a lesion.   The patient is referred for further evaluation and  treatment.  INTERVAL HISTORY: [name] was evaluated in the cancer clinic 11/07/2014 accompanied by her significant other [name].  REVIEW OF SYSTEMS: She has a history of migraines, but these are not more frequent or intense than usual. She thinks she probably needs new glasses. There has not been any nausea, vomiting, neck stiffness, dizziness, or gait imbalance. She keeps a chronic cough, which is generally nonproductive. She denies pleurisy or worsening shortness of breath. There has been no change in bowel or bladder habits. She has not noted any peripheral edema particularly in her right lower leg, and she had no erythema or inflammation after the excisional biopsy. She does not exercise regularly. A detailed review of systems today was otherwise noncontributory  PAST MEDICAL HISTORY: Past Medical History  Diagnosis Date  . External hemorrhoids   . Allergic rhinitis   . Idiopathic interstitial pneumonia, not otherwise specified   . Chronic bronchitis   . History of lung cancer   . Panic attacks   . Anxiety disorder   . Dyspepsia   . Hypercholesteremia   . Insomnia   . Goiter   . Migraine, unspecified, without mention of intractable migraine without mention of status migrainosus   . Female stress incontinence   . Dizziness and giddiness   . Lung cancer   . EXTERNAL HEMORRHOIDS 09/27/2009  . Adhesive capsulitis 05/22/2011    PAST SURGICAL HISTORY: Past Surgical History  Procedure Laterality Date  . Bronchoscopy  01/31/2005    nonspecific inflammation  . Spirometry  07/03/2002    min obstruction,mild restriction 01/31/2005  . Tvh  12/01/2000  . Portacath removed  10/2007  .  Biopsy of right neck mass      negative  . Bronchoscopy  08/24/2010    nonspecific inflammation  . Lung removal, partial      for lung cancer--followed by Dr. Arlyce Dice and Dr. Annamaria Boots    FAMILY HISTORY Family History  Problem Relation Age of Onset  . Lung cancer Father   . Diabetes Maternal Grandfather    the  patient's father died in his mid 15s from lung cancer. The patient's mother is living, in her 15s. The patient has one brother, no sisters. There is no other history of cancer in the family to her knowledge  GYNECOLOGIC HISTORY:  No LMP recorded. Patient has had a hysterectomy. Menarche age 49, first live birth age 55. The patient is GX P1. She had a hysterectomy approximately 2001. She did not undergo salpingo-oophorectomy. She did not take hormone replacement  SOCIAL HISTORY:  She is disabled and lives by herself, with a cat. Her significant other, [name], works as a Chief Strategy Officer. Her daughter [name], 66 years old as of March 2016, lives in [place] and in addition to working has 4 children.    ADVANCED DIRECTIVES: Not in place. The patient intends to name her mother [name] as healthcare power of attorney. She can be reached at [phone]. At the 11/07/2014 visit patient received the appropriate documents 2 complete and notarize at her discretion   HEALTH MAINTENANCE: History  Substance Use Topics  . Smoking status: Former Smoker    Types: Cigarettes    Quit date: 10/26/2001  . Smokeless tobacco: Not on file     Comment: Significant passive exposure from boy friend  . Alcohol Use: No     Comment: history of alcohol abuse 20-30     Colonoscopy: Due  PAP: Status post hysterectomy  Bone density:  Lipid panel:  No Known Allergies  Current Outpatient Prescriptions  Medication Sig Dispense Refill  . albuterol (PROAIR HFA) 108 (90 BASE) MCG/ACT inhaler 1-2 puffs every 4-6 hours as needed for wheezing 1 Inhaler prn  . albuterol (PROVENTIL) (2.5 MG/3ML) 0.083% nebulizer solution Take 3 mLs (2.5 mg total) by nebulization every 6 (six) hours as needed for wheezing or shortness of breath. 75 vial 6  . ALPRAZolam (XANAX) 0.5 MG tablet Take 1 tablet (0.5 mg total) by mouth 3 (three) times daily as needed for anxiety. 90 tablet 3  . citalopram (CELEXA) 20 MG tablet Take 1 tablet (20 mg  total) by mouth daily. 30 tablet 3  . Fluticasone Furoate-Vilanterol (BREO ELLIPTA) 100-25 MCG/INH AEPB Inhale 1 puff into the lungs daily. Rinse after each use 1 each prn  . topiramate (TOPAMAX) 50 MG tablet Take 1.5 tablets (75 mg total) by mouth 2 (two) times daily. 90 tablet 3   No current facility-administered medications for this visit.    OBJECTIVE: Middle-aged white woman who appears stated age 49 Vitals:   11/07/14 1059  BP: 97/51  Pulse: 114  Temp: 97.6 F (36.4 C)  Resp: 17     Body mass index is 21.75 kg/(m^2).    ECOG FS:1 - Symptomatic but completely ambulatory  Ocular: Sclerae unicteric, pupils equal, round and reactive to light Ear-nose-throat: Oropharynx clear and moist Lymphatic: No right popliteal or right inguinal adenopathy. Lungs no rales or rhonchi, good excursion bilaterally Heart regular rate and rhythm, no murmur appreciated Abd soft, nontender, positive bowel sounds MSK no focal spinal tenderness, no joint edema and specifically no right ankle or lower extremity edema Neuro: non-focal, well-oriented, appropriate affect Breasts:  Deferred Skin: The incision and the posterior right calf has healed nicely, with no swelling, erythema, or tenderness. There is no evidence of residual or recurrent melanoma  LAB RESULTS:  CMP     Component Value Date/Time   NA 142 11/07/2014 1045   NA 140 05/13/2013 1052   NA 148* 07/09/2011 1413   K 3.6 11/07/2014 1045   K 3.8 05/13/2013 1052   K 3.5 07/09/2011 1413   CL 110 05/13/2013 1052   CL 111* 07/09/2012 0958   CL 100 07/09/2011 1413   CO2 24 11/07/2014 1045   CO2 24 05/13/2013 1052   CO2 26 07/09/2011 1413   GLUCOSE 75 11/07/2014 1045   GLUCOSE 85 05/13/2013 1052   GLUCOSE 80 07/09/2012 0958   GLUCOSE 96 07/09/2011 1413   BUN 8.7 11/07/2014 1045   BUN 7 05/13/2013 1052   BUN 11 07/09/2011 1413   CREATININE 0.8 11/07/2014 1045   CREATININE 0.77 05/13/2013 1052   CREATININE 0.83 10/30/2010 1104    CALCIUM 8.9 11/07/2014 1045   CALCIUM 9.0 05/13/2013 1052   CALCIUM 8.9 07/09/2011 1413   PROT 7.5 11/07/2014 1045   PROT 6.7 05/13/2013 1052   PROT 7.9 07/09/2011 1413   ALBUMIN 3.8 11/07/2014 1045   ALBUMIN 3.7 05/13/2013 1052   AST 22 11/07/2014 1045   AST 17 05/13/2013 1052   AST 17 07/09/2011 1413   ALT 17 11/07/2014 1045   ALT 11 05/13/2013 1052   ALT 16 07/09/2011 1413   ALKPHOS 72 11/07/2014 1045   ALKPHOS 66 05/13/2013 1052   ALKPHOS 94* 07/09/2011 1413   BILITOT 0.45 11/07/2014 1045   BILITOT 0.6 05/13/2013 1052   BILITOT 1.00 07/09/2011 1413   GFRNONAA >60 10/16/2007 1135   GFRAA  10/16/2007 1135    >60        The eGFR has been calculated using the MDRD equation. This calculation has not been validated in all clinical    INo results found for: SPEP, UPEP  Lab Results  Component Value Date   WBC 7.8 11/07/2014   NEUTROABS 5.8 11/07/2014   HGB 14.2 11/07/2014   HCT 41.8 11/07/2014   MCV 87.1 11/07/2014   PLT 236 11/07/2014      Chemistry      Component Value Date/Time   NA 142 11/07/2014 1045   NA 140 05/13/2013 1052   NA 148* 07/09/2011 1413   K 3.6 11/07/2014 1045   K 3.8 05/13/2013 1052   K 3.5 07/09/2011 1413   CL 110 05/13/2013 1052   CL 111* 07/09/2012 0958   CL 100 07/09/2011 1413   CO2 24 11/07/2014 1045   CO2 24 05/13/2013 1052   CO2 26 07/09/2011 1413   BUN 8.7 11/07/2014 1045   BUN 7 05/13/2013 1052   BUN 11 07/09/2011 1413   CREATININE 0.8 11/07/2014 1045   CREATININE 0.77 05/13/2013 1052   CREATININE 0.83 10/30/2010 1104      Component Value Date/Time   CALCIUM 8.9 11/07/2014 1045   CALCIUM 9.0 05/13/2013 1052   CALCIUM 8.9 07/09/2011 1413   ALKPHOS 72 11/07/2014 1045   ALKPHOS 66 05/13/2013 1052   ALKPHOS 94* 07/09/2011 1413   AST 22 11/07/2014 1045   AST 17 05/13/2013 1052   AST 17 07/09/2011 1413   ALT 17 11/07/2014 1045   ALT 11 05/13/2013 1052   ALT 16 07/09/2011 1413   BILITOT 0.45 11/07/2014 1045   BILITOT 0.6  05/13/2013 1052   BILITOT 1.00 07/09/2011 1413  Lab Results  Component Value Date   LABCA2 26 07/21/2007    No components found for: [id number]  No results for input(s): INR in the last 168 hours.  Urinalysis    Component Value Date/Time   LABSPEC 1.005 07/16/2012 1207   NITRITE Negative 07/16/2012 1207    STUDIES: No results found.  ASSESSMENT: 49 y.o.  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    (a) on Iressa 2004 to 2009  (b) no evidence of disease recurrence on most recent chest x-ray 08/19/2014, and likely cured  (2) ashthmatic bronchitis/ COPD  (3) tobacco abuse: The patient quit smoking 2001  (4) s/p punch biopsy Right posterior calf for malignant melanoma 0.33 mm thickness, level III, w/o ulceration and with a low mitotic index, free deep but positive lateral margins  (5) status post wide excision 09/13/2014 showing a maximum tumor thickness of 1.15 mm, and anatomic level III, with ulceration noted only at the site of prior biopsy, with clear margins( 1 cm per surgical note) with a low mitotic index and brisk infiltration by tumor infiltrating lymphocytes: pT2a NX MX = stage IB   PLAN: We spent approximately 40 minutes today going over her melanoma situation. In general, sentinel lymph node sampling needs to be considered in malignant melanomas with a depth of 1 mm or greater. Looking at the data from the first multicenter selective lymphadenectomy trial, patients in her group had approximately a 10% risk of a positive sentinel lymph node. I am referring her to surgery for further evaluation.  She will follow-up with Dr Sarajane Jews in terms of further dermatologic screening. She is a bit behind in her health maintenance otherwise, and I am setting ehr  up for mammography this month and colonoscopy sometime this year. She will return to see me in about 2 months and I expect we can start routine follow-up then.  [name] has a good understanding of the overall plan. She agrees with it. She knows the goal of treatment in her case is cure. She will call with any problems that may develop before her next visit here.  Chauncey Cruel, MD   11/07/2014 1:09 PM Medical Oncology and Hematology [facility] [address] [place], [place] [address] Tel. [phone]    Fax. [phone]

## 2014-11-07 NOTE — Telephone Encounter (Signed)
GAVE PT AVS REPORT AND APPTS FOR 5/4 WITH GM - 3/9 FOR MAMMO - 3/24 FOR COLONOSCOPY AND 3/11 FOR COLONOSCOPY PRE-OP VISIT. ALL APPTS ON APPT DESK.

## 2014-11-09 ENCOUNTER — Ambulatory Visit
Admission: RE | Admit: 2014-11-09 | Discharge: 2014-11-09 | Disposition: A | Discharge status: Home / Self Care | Payer: Medicare Other | Source: Ambulatory Visit | Attending: Oncology | Admitting: Oncology

## 2014-11-09 DIAGNOSIS — Z1231 Encounter for screening mammogram for malignant neoplasm of breast: Secondary | ICD-10-CM

## 2014-11-10 ENCOUNTER — Other Ambulatory Visit: Payer: Self-pay | Admitting: Oncology

## 2014-11-10 DIAGNOSIS — R928 Other abnormal and inconclusive findings on diagnostic imaging of breast: Secondary | ICD-10-CM

## 2014-11-11 ENCOUNTER — Ambulatory Visit (AMBULATORY_SURGERY_CENTER): Payer: Self-pay | Admitting: *Deleted

## 2014-11-11 VITALS — Ht 65.0 in | Wt 128.8 lb

## 2014-11-11 DIAGNOSIS — R195 Other fecal abnormalities: Secondary | ICD-10-CM

## 2014-11-11 MED ORDER — MOVIPREP 100 G PO SOLR: 1.0000 | Freq: Once | ORAL | Status: DC

## 2014-11-11 NOTE — Progress Notes (Signed)
No egg or soy allergy No issues with past sedation No home 02 use No diet pills emmi declined per pt. Pt states she is interested in hem.banding . Handouts given in PV and instructed to talk with MD the am of her procedure

## 2014-11-16 ENCOUNTER — Ambulatory Visit
Admission: RE | Admit: 2014-11-16 | Discharge: 2014-11-16 | Disposition: A | Discharge status: Home / Self Care | Payer: Medicare Other | Source: Ambulatory Visit | Attending: Oncology | Admitting: Oncology

## 2014-11-16 ENCOUNTER — Other Ambulatory Visit: Payer: Self-pay | Admitting: Oncology

## 2014-11-16 DIAGNOSIS — R928 Other abnormal and inconclusive findings on diagnostic imaging of breast: Secondary | ICD-10-CM

## 2014-11-23 ENCOUNTER — Ambulatory Visit: Payer: Self-pay | Admitting: General Surgery

## 2014-11-23 DIAGNOSIS — C4371 Malignant melanoma of right lower limb, including hip: Secondary | ICD-10-CM

## 2014-11-24 ENCOUNTER — Ambulatory Visit (AMBULATORY_SURGERY_CENTER): Payer: Medicare Other | Admitting: Internal Medicine

## 2014-11-24 ENCOUNTER — Encounter: Payer: Self-pay | Admitting: Internal Medicine

## 2014-11-24 VITALS — BP 104/74 | HR 81 | Temp 97.6°F | Resp 23 | Ht 65.0 in | Wt 128.8 lb

## 2014-11-24 DIAGNOSIS — D122 Benign neoplasm of ascending colon: Secondary | ICD-10-CM

## 2014-11-24 DIAGNOSIS — D124 Benign neoplasm of descending colon: Secondary | ICD-10-CM | POA: Diagnosis not present

## 2014-11-24 DIAGNOSIS — R195 Other fecal abnormalities: Secondary | ICD-10-CM | POA: Diagnosis not present

## 2014-11-24 MED ORDER — SODIUM CHLORIDE 0.9 % IV SOLN
500.0000 mL | INTRAVENOUS | Status: DC
Start: 2014-11-24 — End: 2014-11-24

## 2014-11-24 NOTE — Patient Instructions (Addendum)
YOU HAD AN ENDOSCOPIC PROCEDURE TODAY AT [facility]:   Refer to the procedure report that was given to you for any specific questions about what was found during the examination.  If the procedure report does not answer your questions, please call your gastroenterologist to clarify.  If you requested that your care partner not be given the details of your procedure findings, then the procedure report has been included in a sealed envelope for you to review at your convenience later.  YOU SHOULD EXPECT: Some feelings of bloating in the abdomen. Passage of more gas than usual.  Walking can help get rid of the air that was put into your GI tract during the procedure and reduce the bloating. If you had a lower endoscopy (such as a colonoscopy or flexible sigmoidoscopy) you may notice spotting of blood in your stool or on the toilet paper. If you underwent a bowel prep for your procedure, you may not have a normal bowel movement for a few days.  Please Note:  You might notice some irritation and congestion in your nose or some drainage.  This is from the oxygen used during your procedure.  There is no need for concern and it should clear up in a day or so.  SYMPTOMS TO REPORT IMMEDIATELY:   Following lower endoscopy (colonoscopy or flexible sigmoidoscopy):  Excessive amounts of blood in the stool  Significant tenderness or worsening of abdominal pains  Swelling of the abdomen that is new, acute  Fever of 100F or higher   For urgent or emergent issues, a gastroenterologist can be reached at any hour by calling [phone].   DIET: Your first meal following the procedure should be a small meal and then it is ok to progress to your normal diet. Heavy or fried foods are harder to digest and may make you feel nauseous or bloated.  Likewise, meals heavy in dairy and vegetables can increase bloating.  Drink plenty of fluids but you should avoid alcoholic beverages for 24  hours.  ACTIVITY:  You should plan to take it easy for the rest of today and you should NOT DRIVE or use heavy machinery until tomorrow (because of the sedation medicines used during the test).    FOLLOW UP: Our staff will call the number listed on your records the next business day following your procedure to check on you and address any questions or concerns that you may have regarding the information given to you following your procedure. If we do not reach you, we will leave a message.  However, if you are feeling well and you are not experiencing any problems, there is no need to return our call.  We will assume that you have returned to your regular daily activities without incident.  If any biopsies were taken you will be contacted by phone or by letter within the next 1-3 weeks.  Please call us at [phone] if you have not heard about the biopsies in 3 weeks.    SIGNATURES/CONFIDENTIALITY: You and/or your care partner have signed paperwork which will be entered into your electronic medical record.  These signatures attest to the fact that that the information above on your After Visit Summary has been reviewed and is understood.  Full responsibility of the confidentiality of this discharge information lies with you and/or your care-partner.    Handouts were given to your care partner on polyps, hemorrhoid ,and a high fiber diet with liberal fluid intake. You might notice  some irritation in your nose or drainage.  This may cause feelings of congestion.  This is from the oxygen, which can be drying.  This is no cause for concern; this should clear up in a few days.  You may resume your current medications today.  Except hold aspirin, aspirin products and NSAIDS for 2 weeks.  May use OTC preporation H for hemorrhoids.  Follow up appoint with Dr. Olevia Perches for Tues, April 12th at 2:00 pm on the 3rd floor to discuss hemorrhoids. Await biopsy results. Please call if any questions or  concerns.

## 2014-11-24 NOTE — Progress Notes (Signed)
Report to PACU, RN, vss, BBS= Clear.  

## 2014-11-24 NOTE — Progress Notes (Signed)
Called to room to assist during endoscopic procedure.  Patient ID and intended procedure confirmed with present staff. Received instructions for my participation in the procedure from the performing physician.  

## 2014-11-24 NOTE — Progress Notes (Signed)
No problems noted in the recovery room. maw 

## 2014-11-24 NOTE — Op Note (Addendum)
[address] [place], [id number]   COLONOSCOPY PROCEDURE REPORT  PATIENT: Lori, [name]  MR#: [id number] BIRTHDATE: 05-Jun-1966 , 48  yrs. old GENDER: female ENDOSCOPIST: Lafayette Dragon, MD REFERRED MO:QHUTML Magrinat, M.D. PROCEDURE DATE:  11/24/2014 PROCEDURE:   Colonoscopy, screening, Colonoscopy with snare polypectomy, and Colonoscopy with cold biopsy polypectomy First Screening Colonoscopy - Avg.  risk and is 50 yrs.  old or older Yes.  Prior Negative Screening - Now for repeat screening. N/A  History of Adenoma - Now for follow-up colonoscopy & has been > or = to 3 yrs.  N/A ASA CLASS:   Class II INDICATIONS:Screening for colonic neoplasia, Colorectal Neoplasm Risk Assessment for this procedure is average risk, and intermittent low volume hematochezia.  History of lung cancer. MEDICATIONS: Monitored anesthesia care and Propofol 300 mg IV  DESCRIPTION OF PROCEDURE:   After the risks benefits and alternatives of the procedure were thoroughly explained, informed consent was obtained.  The digital rectal exam revealed no abnormalities of the rectum.   The LB [id number]-H190 [id number]  endoscope was introduced through the anus and advanced to the cecum, which was identified by both the appendix and ileocecal valve. No adverse events experienced.   The quality of the prep was good.  (MoviPrep was used)  The instrument was then slowly withdrawn as the colon was fully examined.      COLON FINDINGS: Six polypoid shaped semi-pedunculated polyps measuring 5- 14 mm in size were found in the descending colon x3 and ascending colon.x3.  A polypectomy was performed using snare cautery. x 4  The resection was complete, the polyp tissue was completely retrieved and sent to histology.  A polypectomy was performed with cold forceps.x1 in ascending colon polyp  The resection was complete, the polyp tissue was completely retrieved and sent to histology.  A  polypectomy was performed with a cold snare. x1 in descending colon polyp  The resection was complete, the polyp tissue was completely retrieved and sent to histology. Moderate sized internal and external Grade I hemorrhoids were found.  Retroflexed views revealed no abnormalities and Retroflexed views revealed internal Grade I hemorrhoids. The time to cecum = 6.38 Withdrawal time = 11.02   The scope was withdrawn and the procedure completed. COMPLICATIONS: There were no immediate complications.  ENDOSCOPIC IMPRESSION: 1.   Six semi-pedunculated polyps ranging from 5 -14 mm were found in the descending colon and ascending colon; polypectomy was performed using snare cautery;x4 polypectomy was performed with cold forceps;x1 polypectomy was performed with a cold snare x1 2.   Moderate sized internal and external Grade I hemorrhoids  RECOMMENDATIONS: 1.  Await pathology results 2.  High fiber diet Recall colonoscopy pending path report probably in 3 years Above findings explain heme positive stool  eSigned:  Lafayette Dragon, MD 11/24/2014 10:43 AM   cc:   PATIENT NAME:  Lori, [name] MR#: [id number]

## 2014-11-24 NOTE — Pre-Procedure Instructions (Signed)
[name]  11/24/2014   Your procedure is scheduled on:  11/29/14  Report to [facility] Admitting at 11 AM.  Call this number if you have problems the morning of surgery: [phone]   Remember:   Do not eat food or drink liquids after midnight.   Take these medicines the morning of surgery with A SIP OF WATER: all inhalers,xanax,celexa   Do not wear jewelry, make-up or nail polish.  Do not wear lotions, powders, or perfumes. You may wear deodorant.  Do not shave 48 hours prior to surgery. Men may shave face and neck.  Do not bring valuables to the hospital.  [facility], [facility], [facility] is not responsible                  for any belongings or valuables.               Contacts, dentures or bridgework may not be worn into surgery.  Leave suitcase in the car. After surgery it may be brought to your room.  For patients admitted to the hospital, discharge time is determined by your                treatment team.               Patients discharged the day of surgery will not be allowed to drive  home.  Name and phone number of your driver: family  Special Instructions: Shower using CHG 2 nights before surgery and the night before surgery.  If you shower the day of surgery use CHG.  Use special wash - you have one bottle of CHG for all showers.  You should use approximately 1/3 of the bottle for each shower.   Please read over the following fact sheets that you were given: Pain Booklet, Coughing and Deep Breathing and Surgical Site Infection Prevention

## 2014-11-25 ENCOUNTER — Encounter (HOSPITAL_COMMUNITY): Payer: Self-pay

## 2014-11-25 ENCOUNTER — Encounter (HOSPITAL_COMMUNITY)
Admission: RE | Admit: 2014-11-25 | Discharge: 2014-11-25 | Disposition: A | Discharge status: Home / Self Care | Payer: Medicare Other | Source: Ambulatory Visit | Attending: General Surgery | Admitting: General Surgery

## 2014-11-25 DIAGNOSIS — Z01810 Encounter for preprocedural cardiovascular examination: Secondary | ICD-10-CM | POA: Diagnosis not present

## 2014-11-25 DIAGNOSIS — Z01812 Encounter for preprocedural laboratory examination: Secondary | ICD-10-CM | POA: Diagnosis present

## 2014-11-25 HISTORY — DX: Cough, unspecified: R05.9

## 2014-11-25 HISTORY — DX: Chronic obstructive pulmonary disease, unspecified: J44.9

## 2014-11-25 HISTORY — DX: Cough: R05

## 2014-11-25 LAB — BASIC METABOLIC PANEL
Anion gap: 5 (ref 5–15)
BUN: 7 mg/dL (ref 6–23)
CHLORIDE: 109 mmol/L (ref 96–112)
CO2: 26 mmol/L (ref 19–32)
Calcium: 8.9 mg/dL (ref 8.4–10.5)
Creatinine, Ser: 0.66 mg/dL (ref 0.50–1.10)
Glucose, Bld: 84 mg/dL (ref 70–99)
POTASSIUM: 3.6 mmol/L (ref 3.5–5.1)
SODIUM: 140 mmol/L (ref 135–145)

## 2014-11-25 LAB — CBC
HCT: 38 % (ref 36.0–46.0)
Hemoglobin: 13.5 g/dL (ref 12.0–15.0)
MCH: 30.3 pg (ref 26.0–34.0)
MCHC: 35.5 g/dL (ref 30.0–36.0)
MCV: 85.2 fL (ref 78.0–100.0)
PLATELETS: 194 10*3/uL (ref 150–400)
RBC: 4.46 MIL/uL (ref 3.87–5.11)
RDW: 13.4 % (ref 11.5–15.5)
WBC: 6.9 10*3/uL (ref 4.0–10.5)

## 2014-11-28 ENCOUNTER — Inpatient Hospital Stay (HOSPITAL_COMMUNITY): Admission: RE | Admit: 2014-11-28 | Payer: Medicare Other | Source: Ambulatory Visit

## 2014-11-28 ENCOUNTER — Telehealth: Payer: Self-pay

## 2014-11-28 MED ORDER — CEFAZOLIN SODIUM-DEXTROSE 2-3 GM-% IV SOLR
2.0000 g | INTRAVENOUS | Status: AC
  Administered 2014-11-29: 2 g via INTRAVENOUS
  Filled 2014-11-28: qty 50

## 2014-11-28 MED ORDER — CHLORHEXIDINE GLUCONATE 4 % EX LIQD
1.0000 "application " | Freq: Once | CUTANEOUS | Status: DC
  Filled 2014-11-28: qty 15

## 2014-11-28 NOTE — H&P (Signed)
History of Present Illness [name]; 11/23/2014 11:23 AM) Patient words: leg melanoma.  The patient is a 49 year old female who presents for a melanoma biopsy follow-up. [name] has history of lung cancer, previously treated by Dr. Jana Hakim. Recently she had a biopsy on her right posterior calf. This demonstrated malignant melanoma 0.33 mm in thickness with negative deep margins. She underwent further wide excision by Dr. Sarajane Jews on September 13, 2014. Final thickness, at this time, was 1.15 cm. Dr. Jana Hakim asked me to see her in consultation for consideration of right inguinal sentinel lymph node biopsy. She has been doing well since her last surgery. She is undergoing colonoscopy tomorrow, as part of routine screening.   Other Problems [name]; 11/23/2014 10:40 AM) Anxiety Disorder Arthritis Chronic Obstructive Lung Disease Emphysema Of Lung Hemorrhoids Lung Cancer Melanoma Migraine Headache  Past Surgical History [name]; 11/23/2014 10:40 AM) Breast Mass; Local Excision Left. Hysterectomy (not due to cancer) - Partial Lung Surgery Right.  Diagnostic Studies History Lori Ewings, [place]; 11/23/2014 10:40 AM) Colonoscopy never Mammogram within last year Pap Smear 1-5 years ago  Allergies [name]; 11/23/2014 10:41 AM) No Known Drug Allergies03/23/2016  Medication History [name]; 11/23/2014 10:41 AM) ALPRAZolam (0.5MG  Tablet, Oral) Active. TraMADol HCl (50MG  Tablet, Oral) Active. Promethazine-Codeine (6.25-10MG /5ML Syrup, Oral) Active. Azithromycin (250MG  Tablet, Oral) Active. Breo Ellipta (100-25MCG/INH Aero Pow Br Act, Inhalation) Active. Citalopram Hydrobromide (20MG  Tablet, Oral) Active. Levofloxacin (500MG  Tablet, Oral) Active. MoviPrep (100GM For Solution, Oral) Active. ProAir HFA (108 (90 Base)MCG/ACT Aerosol Soln, Inhalation) Active. Topiramate (50MG  Tablet, Oral) Active. Medications  Reconciled  Social History Lori Ewings, [place]; 11/23/2014 10:40 AM) No drug use  Family History Lori Ewings, [place]; 11/23/2014 10:40 AM) Respiratory Condition Father.  Pregnancy / Birth History [name]; 11/23/2014 10:40 AM) [name] Age 1 Para 1  Review of Systems Lori Ewings [name]; 11/23/2014 10:40 AM) General Present- Fatigue. Not Present- Appetite Loss, Chills, Fever, Night Sweats, Weight Gain and Weight Loss. Respiratory Present- Chronic Cough, Difficulty Breathing and Wheezing. Not Present- Bloody sputum and Snoring. Breast Not Present- Breast Mass, Breast Pain, Nipple Discharge and Skin Changes. Cardiovascular Present- Leg Cramps. Not Present- Chest Pain, Difficulty Breathing Lying Down, Palpitations, Rapid Heart Rate, Shortness of Breath and Swelling of Extremities. Gastrointestinal Present- Bloody Stool and Excessive gas. Not Present- Abdominal Pain, Bloating, Change in Bowel Habits, Chronic diarrhea, Constipation, Difficulty Swallowing, Gets full quickly at meals, Hemorrhoids, Indigestion, Nausea, Rectal Pain and Vomiting. Female Genitourinary Not Present- Frequency, Nocturia, Painful Urination, Pelvic Pain and Urgency. Musculoskeletal Present- Joint Pain. Not Present- Back Pain, Joint Stiffness, Muscle Pain, Muscle Weakness and Swelling of Extremities. Psychiatric Present- Anxiety. Not Present- Bipolar, Change in Sleep Pattern, Depression, Fearful and Frequent crying. Hematology Present- Easy Bruising. Not Present- Excessive bleeding, Gland problems, HIV and Persistent Infections.   Vitals Lori Ewings [name]; 11/23/2014 10:42 AM) 11/23/2014 10:41 AM Weight: 131 lb Height: 65in Body Surface Area: 1.65 m Body Mass Index: 21.8 kg/m Temp.: 30F(Temporal)  Pulse: 92 (Regular)  Resp.: 17 (Unlabored)  BP: 130/68 (Sitting, Left Arm, Standard)    Physical Exam (Ronda Kazmi E. [place] Silos [name]; 11/23/2014 11:25 AM) General Mental Status-Alert. General Appearance-Consistent  with stated age. Hydration-Well hydrated. Voice-Normal.  Head and Neck Head-normocephalic, atraumatic with no lesions or palpable masses.  Eye Eyeball - Bilateral-Extraocular movements intact. Sclera/Conjunctiva - Bilateral-No scleral icterus.  Chest and Lung Exam Chest and lung exam reveals -quiet, even and easy respiratory effort with no use of accessory muscles and  on auscultation, normal breath sounds, no adventitious sounds and normal vocal resonance. Inspection Chest Wall - Normal. Back - normal.  Cardiovascular Cardiovascular examination reveals -on palpation PMI is normal in location and amplitude, no palpable S3 or S4. Normal cardiac borders., normal heart sounds, regular rate and rhythm with no murmurs, carotid auscultation reveals no bruits and normal pedal pulses bilaterally.  Abdomen Inspection Inspection of the abdomen reveals - No Hernias. Skin - Scar - no surgical scars. Palpation/Percussion Palpation and Percussion of the abdomen reveal - Soft, Non Tender, No Rebound tenderness, No Rigidity (guarding) and No hepatosplenomegaly. Auscultation Auscultation of the abdomen reveals - Bowel sounds normal.  Neurologic Neurologic evaluation reveals -alert and oriented x 3 with no impairment of recent or remote memory. Mental Status-Normal.  Musculoskeletal Normal Exam - Left-Upper Extremity Strength Normal and Lower Extremity Strength Normal. Normal Exam - Right-Upper Extremity Strength Normal, Lower Extremity Weakness. Lower Extremity -Note: Right posterior calf linear scar has healed well without surrounding nodularity.   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 I  have discussed right inguinal sentinel lymph node biopsy with her including risks and benefits. I discussed the approximate percentage of having positive lymph node being 10%. If that is the case, she would need further right inguinal lymphadenectomy. Otherwise she is eager to schedule in the near future and she is agreeable.    Georganna Skeans, [name], MPH, FACS Trauma: [phone] General Surgery: [phone]

## 2014-11-28 NOTE — Telephone Encounter (Signed)
  Follow up Call-  Call back number 11/24/2014  Post procedure Call Back phone  # [phone]  Permission to leave phone message Yes     Patient questions:  Do you have a fever, pain , or abdominal swelling? No. Pain Score  0 *  Have you tolerated food without any problems? Yes.    Have you been able to return to your normal activities? Yes.    Do you have any questions about your discharge instructions: Diet   No. Medications  No. Follow up visit  No.  Do you have questions or concerns about your Care? No.  Actions: * If pain score is 4 or above: No action needed, pain <4.

## 2014-11-29 ENCOUNTER — Ambulatory Visit (HOSPITAL_COMMUNITY): Payer: Medicare Other | Admitting: Anesthesiology

## 2014-11-29 ENCOUNTER — Ambulatory Visit (HOSPITAL_COMMUNITY)
Admission: RE | Admit: 2014-11-29 | Discharge: 2014-11-29 | Disposition: A | Discharge status: Home / Self Care | Payer: Medicare Other | Source: Ambulatory Visit | Attending: General Surgery | Admitting: General Surgery

## 2014-11-29 ENCOUNTER — Encounter (HOSPITAL_COMMUNITY): Payer: Self-pay | Admitting: *Deleted

## 2014-11-29 ENCOUNTER — Encounter (HOSPITAL_COMMUNITY)
Admission: RE | Disposition: A | Discharge status: Home / Self Care | Payer: Self-pay | Source: Ambulatory Visit | Attending: General Surgery

## 2014-11-29 ENCOUNTER — Encounter (HOSPITAL_COMMUNITY)
Admission: RE | Admit: 2014-11-29 | Discharge: 2014-11-29 | Disposition: A | Discharge status: Home / Self Care | Payer: Medicare Other | Source: Ambulatory Visit | Attending: General Surgery | Admitting: General Surgery

## 2014-11-29 DIAGNOSIS — Z792 Long term (current) use of antibiotics: Secondary | ICD-10-CM | POA: Insufficient documentation

## 2014-11-29 DIAGNOSIS — Z90710 Acquired absence of both cervix and uterus: Secondary | ICD-10-CM | POA: Diagnosis not present

## 2014-11-29 DIAGNOSIS — K219 Gastro-esophageal reflux disease without esophagitis: Secondary | ICD-10-CM | POA: Diagnosis not present

## 2014-11-29 DIAGNOSIS — Z87891 Personal history of nicotine dependence: Secondary | ICD-10-CM | POA: Diagnosis not present

## 2014-11-29 DIAGNOSIS — Z85118 Personal history of other malignant neoplasm of bronchus and lung: Secondary | ICD-10-CM | POA: Diagnosis not present

## 2014-11-29 DIAGNOSIS — G43909 Migraine, unspecified, not intractable, without status migrainosus: Secondary | ICD-10-CM | POA: Diagnosis not present

## 2014-11-29 DIAGNOSIS — F419 Anxiety disorder, unspecified: Secondary | ICD-10-CM | POA: Diagnosis not present

## 2014-11-29 DIAGNOSIS — J439 Emphysema, unspecified: Secondary | ICD-10-CM | POA: Insufficient documentation

## 2014-11-29 DIAGNOSIS — C4371 Malignant melanoma of right lower limb, including hip: Secondary | ICD-10-CM

## 2014-11-29 DIAGNOSIS — M1990 Unspecified osteoarthritis, unspecified site: Secondary | ICD-10-CM | POA: Insufficient documentation

## 2014-11-29 DIAGNOSIS — K649 Unspecified hemorrhoids: Secondary | ICD-10-CM | POA: Diagnosis not present

## 2014-11-29 HISTORY — PX: MELANOMA EXCISION WITH SENTINEL LYMPH NODE BIOPSY: SHX5267

## 2014-11-29 SURGERY — MELANOMA EXCISION WITH SENTINEL LYMPH NODE BIOPSY
Anesthesia: General | Site: Leg Lower | Laterality: Right

## 2014-11-29 MED ORDER — FENTANYL CITRATE 0.05 MG/ML IJ SOLN
INTRAMUSCULAR | Status: AC
  Filled 2014-11-29: qty 2

## 2014-11-29 MED ORDER — MIDAZOLAM HCL 2 MG/2ML IJ SOLN
INTRAMUSCULAR | Status: AC
  Administered 2014-11-29: 2 mg
  Filled 2014-11-29: qty 2

## 2014-11-29 MED ORDER — LIDOCAINE HCL (CARDIAC) 10 MG/ML IV SOLN
INTRAVENOUS | Status: DC | PRN
  Administered 2014-11-29: 60 mg via INTRAVENOUS

## 2014-11-29 MED ORDER — METHYLENE BLUE 1 % INJ SOLN
INTRAMUSCULAR | Status: AC
  Filled 2014-11-29: qty 10

## 2014-11-29 MED ORDER — OXYCODONE HCL 5 MG/5ML PO SOLN: 5.0000 mg | Freq: Once | ORAL | Status: AC | PRN

## 2014-11-29 MED ORDER — LACTATED RINGERS IV SOLN
INTRAVENOUS | Status: DC
  Administered 2014-11-29 (×2): via INTRAVENOUS

## 2014-11-29 MED ORDER — HYDROCODONE-ACETAMINOPHEN 5-325 MG PO TABS: 1.0000 | ORAL_TABLET | Freq: Four times a day (QID) | ORAL | Status: DC | PRN

## 2014-11-29 MED ORDER — 0.9 % SODIUM CHLORIDE (POUR BTL) OPTIME
TOPICAL | Status: DC | PRN
  Administered 2014-11-29: 1000 mL

## 2014-11-29 MED ORDER — MIDAZOLAM HCL 2 MG/2ML IJ SOLN
INTRAMUSCULAR | Status: AC
  Filled 2014-11-29: qty 2

## 2014-11-29 MED ORDER — FENTANYL CITRATE 0.05 MG/ML IJ SOLN
INTRAMUSCULAR | Status: DC | PRN
Start: 2014-11-29 — End: 2014-11-29
  Administered 2014-11-29 (×2): 25 ug via INTRAVENOUS

## 2014-11-29 MED ORDER — PROPOFOL 10 MG/ML IV BOLUS
INTRAVENOUS | Status: DC | PRN
  Administered 2014-11-29: 200 mg via INTRAVENOUS

## 2014-11-29 MED ORDER — OXYCODONE HCL 5 MG PO TABS
ORAL_TABLET | ORAL | Status: AC
  Filled 2014-11-29: qty 1

## 2014-11-29 MED ORDER — MIDAZOLAM HCL 5 MG/5ML IJ SOLN
INTRAMUSCULAR | Status: DC | PRN
  Administered 2014-11-29: 2 mg via INTRAVENOUS

## 2014-11-29 MED ORDER — FENTANYL CITRATE 0.05 MG/ML IJ SOLN
100.0000 ug | Freq: Once | INTRAMUSCULAR | Status: AC
  Administered 2014-11-29: 100 ug via INTRAVENOUS

## 2014-11-29 MED ORDER — METHYLENE BLUE 1 % INJ SOLN
INTRAMUSCULAR | Status: DC | PRN
  Administered 2014-11-29: 5 mL via INTRAMUSCULAR

## 2014-11-29 MED ORDER — SODIUM CHLORIDE 0.9 % IJ SOLN
INTRAMUSCULAR | Status: AC
  Filled 2014-11-29: qty 10

## 2014-11-29 MED ORDER — ONDANSETRON HCL 4 MG/2ML IJ SOLN
INTRAMUSCULAR | Status: AC
  Filled 2014-11-29: qty 2

## 2014-11-29 MED ORDER — DEXAMETHASONE SODIUM PHOSPHATE 4 MG/ML IJ SOLN
INTRAMUSCULAR | Status: AC
  Filled 2014-11-29: qty 1

## 2014-11-29 MED ORDER — BUPIVACAINE-EPINEPHRINE (PF) 0.25% -1:200000 IJ SOLN
INTRAMUSCULAR | Status: AC
  Filled 2014-11-29: qty 30

## 2014-11-29 MED ORDER — ONDANSETRON HCL 4 MG/2ML IJ SOLN
INTRAMUSCULAR | Status: DC | PRN
  Administered 2014-11-29: 4 mg via INTRAVENOUS

## 2014-11-29 MED ORDER — BUPIVACAINE-EPINEPHRINE 0.25% -1:200000 IJ SOLN
INTRAMUSCULAR | Status: DC | PRN
  Administered 2014-11-29: 30 mL

## 2014-11-29 MED ORDER — MIDAZOLAM HCL 5 MG/ML IJ SOLN: 2.0000 mg | Freq: Once | INTRAMUSCULAR | Status: DC

## 2014-11-29 MED ORDER — ONDANSETRON HCL 4 MG/2ML IJ SOLN: 4.0000 mg | Freq: Four times a day (QID) | INTRAMUSCULAR | Status: DC | PRN

## 2014-11-29 MED ORDER — HYDROCODONE-ACETAMINOPHEN 5-325 MG PO TABS: 1.0000 | ORAL_TABLET | ORAL | Status: DC | PRN

## 2014-11-29 MED ORDER — TECHNETIUM TC 99M SULFUR COLLOID FILTERED
0.5000 | Freq: Once | INTRAVENOUS | Status: AC | PRN
  Administered 2014-11-29: 0.5 via INTRADERMAL

## 2014-11-29 MED ORDER — FENTANYL CITRATE 0.05 MG/ML IJ SOLN
25.0000 ug | INTRAMUSCULAR | Status: DC | PRN
Start: 2014-11-29 — End: 2014-11-29
  Administered 2014-11-29 (×2): 50 ug via INTRAVENOUS

## 2014-11-29 MED ORDER — OXYCODONE HCL 5 MG PO TABS
5.0000 mg | ORAL_TABLET | Freq: Once | ORAL | Status: AC | PRN
Start: 2014-11-29 — End: 2014-11-29
  Administered 2014-11-29: 5 mg via ORAL

## 2014-11-29 MED ORDER — FENTANYL CITRATE 0.05 MG/ML IJ SOLN
INTRAMUSCULAR | Status: AC
  Filled 2014-11-29: qty 5

## 2014-11-29 MED ORDER — DEXAMETHASONE SODIUM PHOSPHATE 4 MG/ML IJ SOLN
INTRAMUSCULAR | Status: DC | PRN
  Administered 2014-11-29: 4 mg via INTRAVENOUS

## 2014-11-29 SURGICAL SUPPLY — 44 items
APPLIER CLIP 9.375 SM OPEN (CLIP) ×3
BENZOIN TINCTURE PRP APPL 2/3 (GAUZE/BANDAGES/DRESSINGS) ×3 IMPLANT
CANISTER SUCTION 2500CC (MISCELLANEOUS) ×3 IMPLANT
CHLORAPREP W/TINT 26ML (MISCELLANEOUS) ×3 IMPLANT
CLIP APPLIE 9.375 SM OPEN (CLIP) ×1 IMPLANT
CLOSURE WOUND 1/2 X4 (GAUZE/BANDAGES/DRESSINGS) ×1
CONT SPEC 4OZ CLIKSEAL STRL BL (MISCELLANEOUS) ×3 IMPLANT
COVER PROBE W GEL 5X96 (DRAPES) ×3 IMPLANT
COVER SURGICAL LIGHT HANDLE (MISCELLANEOUS) ×3 IMPLANT
DRAPE LAPAROSCOPIC ABDOMINAL (DRAPES) ×3 IMPLANT
DRAPE UTILITY XL STRL (DRAPES) ×6 IMPLANT
ELECT CAUTERY BLADE 6.4 (BLADE) ×3 IMPLANT
ELECT REM PT RETURN 9FT ADLT (ELECTROSURGICAL) ×3
ELECTRODE REM PT RTRN 9FT ADLT (ELECTROSURGICAL) ×1 IMPLANT
GAUZE SPONGE 4X4 12PLY STRL (GAUZE/BANDAGES/DRESSINGS) ×3 IMPLANT
GLOVE BIO SURGEON STRL SZ7 (GLOVE) ×3 IMPLANT
GLOVE BIO SURGEON STRL SZ8 (GLOVE) ×3 IMPLANT
GLOVE BIOGEL PI IND STRL 7.0 (GLOVE) ×2 IMPLANT
GLOVE BIOGEL PI IND STRL 8 (GLOVE) ×1 IMPLANT
GLOVE BIOGEL PI INDICATOR 7.0 (GLOVE) ×4
GLOVE BIOGEL PI INDICATOR 8 (GLOVE) ×2
GLOVE SURG SS PI 7.0 STRL IVOR (GLOVE) ×6 IMPLANT
GOWN STRL REUS W/ TWL LRG LVL3 (GOWN DISPOSABLE) ×2 IMPLANT
GOWN STRL REUS W/ TWL XL LVL3 (GOWN DISPOSABLE) ×1 IMPLANT
GOWN STRL REUS W/TWL LRG LVL3 (GOWN DISPOSABLE) ×4
GOWN STRL REUS W/TWL XL LVL3 (GOWN DISPOSABLE) ×2
KIT BASIN OR (CUSTOM PROCEDURE TRAY) ×3 IMPLANT
KIT ROOM TURNOVER OR (KITS) ×3 IMPLANT
LIQUID BAND (GAUZE/BANDAGES/DRESSINGS) ×3 IMPLANT
NEEDLE 18GX1X1/2 (RX/OR ONLY) (NEEDLE) ×3 IMPLANT
NEEDLE 22X1 1/2 (OR ONLY) (NEEDLE) ×6 IMPLANT
NS IRRIG 1000ML POUR BTL (IV SOLUTION) ×3 IMPLANT
PACK GENERAL/GYN (CUSTOM PROCEDURE TRAY) ×3 IMPLANT
PAD ARMBOARD 7.5X6 YLW CONV (MISCELLANEOUS) ×6 IMPLANT
STAPLER VISISTAT 35W (STAPLE) ×3 IMPLANT
STRIP CLOSURE SKIN 1/2X4 (GAUZE/BANDAGES/DRESSINGS) ×2 IMPLANT
SUT MON AB 4-0 PC3 18 (SUTURE) ×6 IMPLANT
SUT SILK 2 0 SH (SUTURE) ×3 IMPLANT
SUT VIC AB 3-0 SH 27 (SUTURE) ×2
SUT VIC AB 3-0 SH 27XBRD (SUTURE) ×1 IMPLANT
SYR CONTROL 10ML LL (SYRINGE) ×6 IMPLANT
TOWEL OR 17X24 6PK STRL BLUE (TOWEL DISPOSABLE) ×3 IMPLANT
TOWEL OR 17X26 10 PK STRL BLUE (TOWEL DISPOSABLE) ×3 IMPLANT
WATER STERILE IRR 1000ML POUR (IV SOLUTION) IMPLANT

## 2014-11-29 NOTE — Transfer of Care (Signed)
Immediate Anesthesia Transfer of Care Note  Patient: [name]  Procedure(s) Performed: Procedure(s): RIGHT INGUINAL SENTINEL LYMPH NODE BIOPSY (Right)  Patient Location: PACU  Anesthesia Type:General  Level of Consciousness: awake, oriented and patient cooperative  Airway & Oxygen Therapy: Patient Spontanous Breathing and Patient connected to nasal cannula oxygen  Post-op Assessment: Report given to RN and Post -op Vital signs reviewed and stable  Post vital signs: Reviewed  Last Vitals:  Filed Vitals:   11/29/14 1230  BP: 100/65  Pulse: 84  Temp:   Resp: 16    Complications: No apparent anesthesia complications

## 2014-11-29 NOTE — Anesthesia Preprocedure Evaluation (Signed)
Anesthesia Evaluation  Patient identified by MRN, date of birth, ID band Patient awake    Reviewed: Allergy & Precautions, NPO status , Patient's Chart, lab work & pertinent test results  Airway Mallampati: II   Neck ROM: full    Dental   Pulmonary asthma , COPDformer smoker,  Lung CA breath sounds clear to auscultation        Cardiovascular negative cardio ROS  Rhythm:regular Rate:Normal     Neuro/Psych  Headaches, Seizures -,  Anxiety    GI/Hepatic GERD-  ,  Endo/Other    Renal/GU      Musculoskeletal  (+) Arthritis -,   Abdominal   Peds  Hematology   Anesthesia Other Findings   Reproductive/Obstetrics                             Anesthesia Physical Anesthesia Plan  ASA: III  Anesthesia Plan: General   Post-op Pain Management:    Induction: Intravenous  Airway Management Planned: LMA  Additional Equipment:   Intra-op Plan:   Post-operative Plan:   Informed Consent: I have reviewed the patients History and Physical, chart, labs and discussed the procedure including the risks, benefits and alternatives for the proposed anesthesia with the patient or authorized representative who has indicated his/her understanding and acceptance.     Plan Discussed with: CRNA, Anesthesiologist and Surgeon  Anesthesia Plan Comments:         Anesthesia Quick Evaluation

## 2014-11-29 NOTE — Op Note (Signed)
11/29/2014  2:29 PM  PATIENT:  [name]  49 y.o. female  PRE-OPERATIVE DIAGNOSIS:  Melanoma right lower leg  POST-OPERATIVE DIAGNOSIS:  Melanoma right lower leg  PROCEDURE:  Procedure(s): RIGHT INGUINAL SENTINEL LYMPH NODE BIOPSY  SURGEON:  Surgeon(s): Georganna Skeans, MD  ASSISTANTS: none   ANESTHESIA:   local and general  EBL:     BLOOD ADMINISTERED:none  DRAINS: none   SPECIMEN:  Excision  DISPOSITION OF SPECIMEN:  PATHOLOGY  COUNTS:  YES  DICTATION: .Dragon Dictation [name] is status post excision of melanoma from her right posterior calf. She presents for right inguinal Sentinel lymph node biopsy. Nuclear medicine did an injection in preop holding. She was identified there and informed consent was obtained. She received intravenous antibiotic. She was brought to the operating room and general anesthesia was administered by the anesthesia staff. We did a time out procedure. 4 mL of methylene blue was injected intradermally around her wide excision scar using aseptic technique. Right inguinal region was then prepped and draped in sterile fashion. Neoprobe was used and easily located a hot spot in the groin. Local was injected. A transverse incision was made along tissue lines and subcutaneous tissues were dissected down. I located a small, blue lymphatic and trace this to a hot blue lymph node. This was circumferentially dissected, using small clips on the lymphatics and small blood vessels. It was excised and sent to pathology as a hot, blue lymph node. Neoprobe reading was over 400. Further neoprobe exploration of the wound revealed no other signal. The wound was copiously irrigated. Additional local was injected. Hemostasis was obtained. Wound was closed in 2 layers with deep tissues approximated with interrupted 3-0 Vicryl. Skin was closed with running 4-0 Monocryl subcuticular stitch followed by liquid band. All counts were correct. Patient tolerated procedure well  without apparent complications and was taken recovery in stable condition.  PATIENT DISPOSITION:  PACU - hemodynamically stable.   Delay start of Pharmacological VTE agent (>24hrs) due to surgical blood loss or risk of bleeding:  no  Georganna Skeans, MD, MPH, FACS Pager: [phone]  3/29/20162:29 PM

## 2014-11-29 NOTE — Anesthesia Postprocedure Evaluation (Signed)
  Anesthesia Post-op Note  Patient: [name]  Procedure(s) Performed: Procedure(s): RIGHT INGUINAL SENTINEL LYMPH NODE BIOPSY (Right)  Patient Location: PACU  Anesthesia Type: General   Level of Consciousness: awake, alert  and oriented  Airway and Oxygen Therapy: Patient Spontanous Breathing  Post-op Pain: mild  Post-op Assessment: Post-op Vital signs reviewed  Post-op Vital Signs: Reviewed  Last Vitals:  Filed Vitals:   11/29/14 1540  BP:   Pulse:   Temp: 36.7 C  Resp:     Complications: No apparent anesthesia complications

## 2014-11-29 NOTE — Interval H&P Note (Signed)
History and Physical Interval Note:  11/29/2014 12:46 PM  [name]  has presented today for surgery, with the diagnosis of melanoma right lower leg  The various methods of treatment have been discussed with the patient and family. After consideration of risks, benefits and other options for treatment, the patient has consented to  Procedure(s): RIGHT INGUINAL  WITH SENTINEL LYMPH NODE BIOPSY (Right) as a surgical intervention .  The patient's history has been reviewed, patient re-examined, no change in status, stable for surgery.  I have reviewed the patient's chart and labs.  Questions were answered to the patient's satisfaction.     Verlisa Vara E

## 2014-11-29 NOTE — Anesthesia Procedure Notes (Signed)
Procedure Name: LMA Insertion Date/Time: 11/29/2014 1:54 PM Performed by: Merrilyn Puma B Pre-anesthesia Checklist: Patient identified, Timeout performed, Emergency Drugs available, Suction available and Patient being monitored Patient Re-evaluated:Patient Re-evaluated prior to inductionOxygen Delivery Method: Circle system utilized Preoxygenation: Pre-oxygenation with 100% oxygen Intubation Type: IV induction Ventilation: Mask ventilation without difficulty LMA: LMA inserted LMA Size: 4.0 Number of attempts: 1 Placement Confirmation: positive ETCO2 and breath sounds checked- equal and bilateral Tube secured with: Tape Dental Injury: Teeth and Oropharynx as per pre-operative assessment

## 2014-11-30 ENCOUNTER — Encounter (HOSPITAL_COMMUNITY): Payer: Self-pay | Admitting: General Surgery

## 2014-11-30 ENCOUNTER — Other Ambulatory Visit: Payer: Self-pay | Admitting: Oncology

## 2014-12-01 ENCOUNTER — Encounter: Payer: Self-pay | Admitting: *Deleted

## 2014-12-01 ENCOUNTER — Encounter: Payer: Self-pay | Admitting: Family Medicine

## 2014-12-01 ENCOUNTER — Telehealth: Payer: Self-pay | Admitting: *Deleted

## 2014-12-01 ENCOUNTER — Other Ambulatory Visit: Payer: Self-pay | Admitting: Oncology

## 2014-12-01 ENCOUNTER — Ambulatory Visit (INDEPENDENT_AMBULATORY_CARE_PROVIDER_SITE_OTHER): Payer: Medicare Other | Admitting: Family Medicine

## 2014-12-01 VITALS — BP 105/64 | HR 96 | Temp 98.1°F | Ht 65.0 in | Wt 132.8 lb

## 2014-12-01 DIAGNOSIS — M7989 Other specified soft tissue disorders: Secondary | ICD-10-CM | POA: Diagnosis not present

## 2014-12-01 DIAGNOSIS — F411 Generalized anxiety disorder: Secondary | ICD-10-CM | POA: Diagnosis not present

## 2014-12-01 DIAGNOSIS — K5900 Constipation, unspecified: Secondary | ICD-10-CM | POA: Insufficient documentation

## 2014-12-01 DIAGNOSIS — C4371 Malignant melanoma of right lower limb, including hip: Secondary | ICD-10-CM

## 2014-12-01 DIAGNOSIS — G43909 Migraine, unspecified, not intractable, without status migrainosus: Secondary | ICD-10-CM | POA: Diagnosis not present

## 2014-12-01 DIAGNOSIS — C189 Malignant neoplasm of colon, unspecified: Secondary | ICD-10-CM | POA: Insufficient documentation

## 2014-12-01 HISTORY — DX: Other specified soft tissue disorders: M79.89

## 2014-12-01 MED ORDER — ALPRAZOLAM 0.5 MG PO TABS: 0.5000 mg | ORAL_TABLET | Freq: Three times a day (TID) | ORAL | Status: DC | PRN

## 2014-12-01 MED ORDER — POLYETHYLENE GLYCOL 3350 17 GM/SCOOP PO POWD: ORAL | Status: DC

## 2014-12-01 MED ORDER — TOPIRAMATE 50 MG PO TABS: 75.0000 mg | ORAL_TABLET | Freq: Two times a day (BID) | ORAL | Status: DC

## 2014-12-01 MED ORDER — CITALOPRAM HYDROBROMIDE 20 MG PO TABS: 20.0000 mg | ORAL_TABLET | Freq: Every day | ORAL | Status: DC

## 2014-12-01 NOTE — Patient Instructions (Addendum)
It was a pleasure seeing you today, Ms Dimmitt!  I am sorry that you have so much going on right now.  I will work hard to make sure that you care is being coordinated appropriately.  Please make sure to follow up with your GI specialist and your oncologist.  Information regarding what we discussed is included in this packet.  Please make an appointment to see me after you have met with your other doctors or sooner if needed.  If there is anything else I can do please do not hesitate to call our office.  Continue to elevate your legs when sitting.  Also continue to do the "pedal pump" exercise I gave you to get the fluid off your feet.  Please feel free to call our office at [phone] if any questions or concerns arise.  Warm Regards, Tyus Kallam M. Jordanna Hendrie, DO  Ankle Exercises for Rehabilitation Following ankle injuries, it is as important to follow your caregiver's instructions for regaining full use of your ankle as it was to follow the initial treatment plan following the injury. The following are some suggestions for exercises and treatment, which can be done to help you regain full use of your ankle as soon as possible.  Follow all instructions regarding physical therapy.  Before exercising, it may be helpful to use heat on the muscles or joint being exercised. This loosens up the muscles and tendons (cordlike structure) and decreases chances of injury during your exercises. If this is not possible, just begin your exercises slowly to gradually warm up.  Stand on your toes several times per day to strengthen the calf muscles. These are the muscles in the back of your leg between the knee and the heel. The cord you can feel just above the heel is the Achilles tendon. Rise up on your toes several times repeating this three to four times per day. Do not exercise to the point of pain. If pain starts to develop, decrease the exercise until you are comfortable again.  Do range of motion  exercises. This means moving the ankle in all directions. Practice writing the alphabet with your toes in the air. Do not increase beyond a range that is comfortable.  Increase the strength of the muscles in the front of your leg by raising your toes and foot straight up in the air. Repeat this exercise as you did the calf exercise with the same warnings. This also help to stretch your muscles.  Stretch your calf muscles also by leaning against a wall with your hands in front of you. Put your feet a few feet from the wall and bend your knees until you feel the muscles in your calves become tight.  After exercising it may be helpful to put ice on the ankle to prevent swelling and improve rehabilitation. This may be done for 15 to 20 minutes following your exercises. If exercising is being done in the workplace, this may not always be possible.  Taping an ankle injury may be helpful to give added support following an injury. It also may help prevent reinjury. This may be true if you are in training or in a conditioning program. You and your caregiver can decide on the best course of action to follow. Document Released: 08/16/2000 Document Revised: 01/03/2014 Document Reviewed: 08/13/2008 [facility] Patient Information 2015 [place], [place]. This information is not intended to replace advice given to you by your health care provider. Make sure you discuss any questions you have with your  health care provider.

## 2014-12-01 NOTE — Assessment & Plan Note (Signed)
Well controlled on Topamax -RF x6 months

## 2014-12-01 NOTE — Assessment & Plan Note (Signed)
Discussed inguinal node bx. Which was negative.  -Recommended that patient follow up with specialists as planned.

## 2014-12-01 NOTE — Assessment & Plan Note (Signed)
Patient with increased anxiety 2/2 to health and financial burdens.  No SI/HI.  -RF Celexa x6 months -Continue to monitor.  -Patient instructed to seek immediate medical attention if SI/HI

## 2014-12-01 NOTE — Progress Notes (Signed)
I was the preceptor for this visit.

## 2014-12-01 NOTE — Progress Notes (Signed)
Patient ID: [name], female   DOB: [birth year], 49 y.o.   MRN: [id number]    Subjective: CC: biopsy follow up, R ankle swelling HPI: Patient is a 49 y.o. female presenting to clinic today for follow up appointment. Concerns today include:  1. R ankle swelling Patient reports that it has been swollen since last night.  It improved when slept but at the end of the day, it is swollen again this afternoon.  She endorses some discomfort from "tightness".  Continues to be able to ambulate.  She has been elevating feet.  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  Patient denies fevers, SOB, CP, cough.  2. Anxiety/ Adenocarcinoma of Colon/ Malignant melanoma Patient informs me that she received a call about 1.5 hours ago informing her of her colonoscopy results.  She voices a lot of anxiety about her health and the medical bills that are piling up.  She denies fevers, chills, weight loss.  Endorses some fatigue 2/2 poor sleep.  She continues to take Celexa with good relief until new diagnoses of cancer.  3. Constipation Patient reports about 1-2 month h/o constipation.  She states that she uses a laxative intermittently with good results.  She states that she noticed the change shortly after starting Celexa.  She reports drinking water regularly and incorporating some veggies into her diet.  She is open to trying Miralax, as this was also suggested by an outside provider.  4. Migraine Patient reports that migraine headache are well controlled with Topamax.  Denies any recent migraine headaches.  No nausea, vomiting, vision changes, difficulty finding words.  Social History Reviewed: former smoker. FamHx and MedHx updated.  Please see EMR.  ROS: All other systems reviewed and are negative.  Objective: Office vital signs reviewed. BP 105/64 mmHg  Pulse 96  Temp(Src) 98.1 F (36.7 C) (Oral)  Ht 5\' 5"  (1.651 m)  Wt 132 lb 12.8 oz  (60.238 kg)  BMI 22.10 kg/m2  Physical Examination:  General: Awake, alert, well nourished, NAD HEENT: Normal, MMM, EOMI Extremities: WWP  Right LE: healing lesion on right calf with no evidence of infection; right ankle with moderate edema.  AROM/PROM normal, negative homans, no TTP, no bruising MSK: Normal gait and station  Assessment: 48 y.o. female with R ankle swelling, anxiety,   Plan: See Problem List and After Visit Summary   [name], DO PGY-1, [place]

## 2014-12-01 NOTE — Assessment & Plan Note (Addendum)
Patient notified of dx today by ordering provider.  Results reviewed with patient and a copy given for her records. Bx results: "One of the biopsy fragments shows invasive adenocarcinoma and the morphologic features are most consistent with colorectal adenocarcinoma. There are also fragments of tubular adenoma present." -instructed patient to follow up with providers as scheduled.   -We discussed that there is a high likelihood that patient will need further testing for staging and treatment purposes.   -Patient given return precautions.

## 2014-12-01 NOTE — Assessment & Plan Note (Signed)
Patient with recent bx of R inguinal node.  Now with swelling of R ankle. Suspect that this is likely 2/2 to inguinal node removal but cannot r/o that there is no DVT.  Given patient's risk factors (hypercoag 2/2 malignancy, recent sx), will order RLE venous doppler to evaluate for possible DVT.  PE fairly unremarkable except for swelling of RLE. -f/u venous doppler of RLE -Will start on anticoagulants if appropriate

## 2014-12-01 NOTE — Telephone Encounter (Signed)
Spoke with [name] and appointment set up for Monday 12/05/2014 @ 10:30am. No Prior Auth Needed.   Called patient and notified her of appointment and she has agreed to the time.

## 2014-12-01 NOTE — Assessment & Plan Note (Signed)
Intermittent constipation, worsened since starting Celexa. -Increase Fiber and water -Miralax 1-2 times daily PRN constipation

## 2014-12-02 ENCOUNTER — Other Ambulatory Visit: Payer: Self-pay | Admitting: Oncology

## 2014-12-03 NOTE — Addendum Note (Signed)
Addendum  created 12/03/14 0830 by Albertha Ghee, MD   Modules edited: Anesthesia Attestations

## 2014-12-05 ENCOUNTER — Ambulatory Visit (HOSPITAL_COMMUNITY)
Admission: RE | Admit: 2014-12-05 | Discharge: 2014-12-05 | Disposition: A | Discharge status: Home / Self Care | Payer: Medicare Other | Source: Ambulatory Visit | Attending: Family Medicine | Admitting: Family Medicine

## 2014-12-05 DIAGNOSIS — M7989 Other specified soft tissue disorders: Secondary | ICD-10-CM | POA: Insufficient documentation

## 2014-12-05 NOTE — Progress Notes (Signed)
*  Preliminary Results* Right lower extremity venous duplex completed. Right lower extremity is negative for deep vein thrombosis. There is no evidence of right Baker's cyst.  Incidental finding: There is an anechoic area with mixed internal echoes in the right groin measuring 4.5 x 3.9cm in the area of incision.  12/05/2014 12:59 PM  Maudry Mayhew, RVT, RDCS, RDMS

## 2014-12-07 ENCOUNTER — Encounter: Payer: Self-pay | Admitting: Family Medicine

## 2014-12-13 ENCOUNTER — Ambulatory Visit (INDEPENDENT_AMBULATORY_CARE_PROVIDER_SITE_OTHER): Payer: Medicare Other | Admitting: Internal Medicine

## 2014-12-13 ENCOUNTER — Encounter: Payer: Self-pay | Admitting: Internal Medicine

## 2014-12-13 VITALS — BP 92/60 | HR 80 | Ht 65.0 in | Wt 132.4 lb

## 2014-12-13 DIAGNOSIS — C189 Malignant neoplasm of colon, unspecified: Secondary | ICD-10-CM | POA: Diagnosis not present

## 2014-12-13 MED ORDER — PEG-KCL-NACL-NASULF-NA ASC-C 100 G PO SOLR: 1.0000 | Freq: Once | ORAL | Status: DC

## 2014-12-13 NOTE — Progress Notes (Signed)
[name] [birth year] [id number]  Note: This dictation was prepared with Dragon digital system. Any transcriptional errors that result from this procedure are unintentional.   History of Present Illness: This is a 49 year old white female with history of lung cancer and melanoma now with new diagnosis of colon cancer based on postpolypectomy specimen from colonoscopy on 11/24/2014. Multiple polyps were removed: 3 in the descending colon which were all adenomatous and 3 in a ascending colon which showed 2 of them were adenomas and one was adenocarcinoma. 14 mm in size. . Since they did not look suspicious we did not place tattoo  For surgical marking.Marland Kitchen She will have to need resection of the right colon. We will repeat colonoscopy and place  markers at the post-polypectomy sites, specifically tattoo and endoclips    Past Medical History  Diagnosis Date  . External hemorrhoids   . Allergic rhinitis   . Idiopathic interstitial pneumonia, not otherwise specified   . Chronic bronchitis   . History of lung cancer   . Panic attacks   . Anxiety disorder   . Dyspepsia   . Insomnia   . Goiter   . Migraine, unspecified, without mention of intractable migraine without mention of status migrainosus   . Female stress incontinence   . Dizziness and giddiness   . Lung cancer   . EXTERNAL HEMORRHOIDS 09/27/2009  . 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 Skin cancer (melanoma)     right calf  . Hypercholesteremia     denies-last check normal labs  . Seizures     very long ago like 15 years ago or so , questionable etilogy  . GERD (gastroesophageal reflux disease)     occ depending on diet   . COPD (chronic obstructive pulmonary disease)   . Cough     Past Surgical History  Procedure Laterality Date  . Bronchoscopy  01/31/2005    nonspecific inflammation  . Spirometry  07/03/2002    min obstruction,mild restriction 01/31/2005  . Tvh  12/01/2000    hysterectomy  . Portacath removed  10/2007  .  Biopsy of right neck mass      negative-thyroid biopsy   . Bronchoscopy  08/24/2010    nonspecific inflammation  . Lung removal, partial      for lung cancer--followed by Dr. Arlyce Dice and Dr. Annamaria Boots  . Melanoma removal      right calf   . Melanoma excision with sentinel lymph node biopsy Right 11/29/2014    Procedure: RIGHT INGUINAL SENTINEL LYMPH NODE BIOPSY;  Surgeon: Georganna Skeans, MD;  Location: [facility];  Service: General;  Laterality: Right;    No Known Allergies  Family history and social history have been reviewed.  Review of Systems:   The remainder of the 10 point ROS is negative except as outlined in the H&P  Physical Exam: General Appearance Well developed, in no distress Psychological Normal mood and affect  Assessment and Plan:   49 year old white female with the cancerous polyp of the ascending colon removed on colonoscopy 3 weeks ago. Segmental resection of the right colon will be necessary  Since tattoo was not placed at the time of colonoscopy we will repeat the colonoscopy and place  endoclips as well as tattoo in the appropriate areas in the right colon. I have discussed this with the patient and she agrees    [name] 12/13/2014

## 2014-12-13 NOTE — Patient Instructions (Addendum)
You have been scheduled for a colonoscopy. Please follow written instructions given to you at your visit today.  Please pick up your prep supplies at the pharmacy within the next 1-3 days. If you use inhalers (even only as needed), please bring them with you on the day of your procedure.    cc: Dr. Adam Phenix

## 2014-12-14 ENCOUNTER — Telehealth: Payer: Self-pay | Admitting: *Deleted

## 2014-12-15 MED ORDER — NA SULFATE-K SULFATE-MG SULF 17.5-3.13-1.6 GM/177ML PO SOLN: 1.0000 | Freq: Once | ORAL | Status: DC

## 2014-12-15 NOTE — Addendum Note (Signed)
Addended by: Celene Skeen A on: 12/15/2014 08:22 AM   Modules accepted: Orders, Medications

## 2014-12-15 NOTE — Telephone Encounter (Signed)
Sent Rx for [employer] to [facility]. Moviprep was not covered by the patient's insurance.

## 2014-12-20 ENCOUNTER — Encounter: Payer: Self-pay | Admitting: *Deleted

## 2014-12-26 ENCOUNTER — Encounter: Payer: Self-pay | Admitting: Internal Medicine

## 2014-12-26 ENCOUNTER — Ambulatory Visit (INDEPENDENT_AMBULATORY_CARE_PROVIDER_SITE_OTHER): Payer: Medicare Other | Admitting: Internal Medicine

## 2014-12-26 VITALS — BP 120/74 | HR 89 | Ht 65.0 in | Wt 132.0 lb

## 2014-12-26 DIAGNOSIS — J4541 Moderate persistent asthma with (acute) exacerbation: Secondary | ICD-10-CM | POA: Diagnosis not present

## 2014-12-26 DIAGNOSIS — J309 Allergic rhinitis, unspecified: Secondary | ICD-10-CM | POA: Diagnosis not present

## 2014-12-26 DIAGNOSIS — J3089 Other allergic rhinitis: Principal | ICD-10-CM

## 2014-12-26 DIAGNOSIS — J302 Other seasonal allergic rhinitis: Secondary | ICD-10-CM

## 2014-12-26 MED ORDER — ALBUTEROL SULFATE HFA 108 (90 BASE) MCG/ACT IN AERS: INHALATION_SPRAY | RESPIRATORY_TRACT | Status: DC

## 2014-12-26 MED ORDER — AZITHROMYCIN 250 MG PO TABS: ORAL_TABLET | ORAL | Status: DC

## 2014-12-26 MED ORDER — ALBUTEROL SULFATE (2.5 MG/3ML) 0.083% IN NEBU: 2.5000 mg | INHALATION_SOLUTION | Freq: Four times a day (QID) | RESPIRATORY_TRACT | Status: DC | PRN

## 2014-12-26 MED ORDER — FLUTICASONE FUROATE-VILANTEROL 100-25 MCG/INH IN AEPB: 1.0000 | INHALATION_SPRAY | Freq: Every day | RESPIRATORY_TRACT | Status: DC

## 2014-12-26 NOTE — Progress Notes (Signed)
Patient ID: [name], female    DOB: Jan 06, 1966, 49 y.o.   MRN: [id number]  HPI 50 yo former heavy smoker followed after RUL lobectomy for lung cancer, chronic bronchitis, allergic rhinitis, recent diffuse interstitial process. Dr Jana Hakim recently saw her for Oncology f/u and repeated CT 11/09/10 - images reviewed. There has been a decrease in the numerous cavitating nodular infiltrates since November comparison. This improved with no treatment. She has seen Dr Arlyce Dice today.  She has been sunning herself, lying outside and blames seasonal pollen for what she considers normal for season mild chest congestion with scant light yellow phlegm. Denies chest pain, fever, night sweats, nodes. Recent PPD skin test was negative. HIV serology was negative. She had chosen to defer bronchoscopy discussed at last visit, because she felt well despite CXR.  02/12/11-  Feels ok. Had a chest cold a couple of weeks ago- largely resolved. Otherwise has felt well. Boyfriend still smokes heavily around her-  makes her cough. Otherwise not noting routine cough, chest pain, palpitation, fever.  06/25/11-44 yo former heavy smoker followed after RUL lobectomy for lung cancer, chronic bronchitis, allergic rhinitis, recent diffuse interstitial process. Cooler air of the fall season and humid air in the shower both cause her chest to tighten. Dr. Arlyce Dice has now released her from his long-term followup. She is coughing and wheezing very little and feels stable. She is still exposed to her boyfriend's cigarette smoke. CXR-06/12/2011-stable post therapy appearance of the chest with nothing new. There is a right hilar/paratracheal mass with surgical clips unchanged from 2010, radiation changes. The widespread pulmonary interstitial opacities which developed over a year ago, are stable and consistent now with scarring.  12/24/11- 62 yo former heavy smoker followed after RUL lobectomy for lung cancer, chronic bronchitis, allergic  rhinitis, recent diffuse interstitial process. Blames pollen for recent tighter chest, mild wheeze, nasal congestion and drainage. Denies fever, pain or blood. Discussed medication refill needs. Educated the difference between Mucinex and antihistamines. Chest x-ray 06/12/2011-reviewed-stable.  06/23/12- 63 yo former heavy smoker followed after RUL lobectomy for lung cancer, chronic bronchitis, allergic rhinitis, recent diffuse interstitial process.  Had flu vaccine Pt c/o increased coughing while showering, pt thinks d/t heat and steam. Pt notices this with severe weather changes as well.  Patient needs printed Rx 30 day supply/refills for Foradil, Xopenex HFA and Neb and Asmanex. Boyfriend still smokes but they got rid of the wood stove. Xopenex has been a big help. Her oncologist will get a followup CT scan but there has been no recurrence of her lung cancer.  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  FOLLOWS FOR: Pt. reports there is no change with her coughing, pt. states she feels stuffy, due to weather change.  Pt. denies any chest tightness or wheezing. Sneezing some from the pollen. Otherwise okay. She has been released for followup by her oncologist after 5 years. medications reviewed. Very sensitive to albuterol-shaky. CT 07/09/12 IMPRESSION:  1. Stable surgical and radiation changes involving the right lung.  No findings for recurrent tumor or pulmonary metastatic disease.  2. 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 Recommend clinical correlation.  Original Report Authenticated By: Marijo Sanes, M.D.  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  FOLLOWS MEQ:ASTMHDQQI is about the same however having slight increase in cough(gets this time of year) [name] better as well. Had flu vaccine. 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  FOLLOWS FOR: Pollen is causing her to have cough and congestion. Head congestion, sneeze, some cough-green. No fever. Likes Breo. CT 06/24/13 IMPRESSION:  Stable postsurgical changes and postradiation changes in right upper  lobe. Surgical clips in right hilum again noted. Again noted status  post right upper thoracotomy. No pulmonary edema. No definite  evidence of superimposed infiltrate or recurrent metastatic disease.  Electronically Signed  By: Lahoma Crocker M.D.  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  FOLLOW FOR:  Asthma w/ Bronchitis; sinus pressure; cough, would like cough medication to help her sleep at night Acute- 3 days- cough green, sinus drainage, ? 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 Complicated by AdenoCA colon polyp, melanoma R  Calf FOLLOWS FOR: Pt states she feels she is getting a flare up from pollen,etc.  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 Pollen is making her cough some with slightly yellow sputum, postnasal drainage but she think she's had some low-grade fever, feels badly and thinks she has a cold. Has liked Breo inhaler. CXR 08/19/14 IMPRESSION: 1. No interval change. 2. Stable postsurgical change in  the right hemi thorax. 3. Stable interstitial lung disease. Electronically Signed  By: Suzy Bouchard M.D.  On: 08/19/2014 18:05  Review of Systems-See HPI Constitutional:   No-   weight loss, night sweats, fevers, chills, fatigue, lassitude. HEENT:   No-  headaches, difficulty swallowing, tooth/dental problems, sore throat,      +mild sneezing,no- itching, ear ache, +nasal congestion, post nasal drip,  CV:  No-   chest pain, orthopnea, PND, swelling in lower extremities, anasarca, dizziness, palpitations Resp: No- acute  shortness of breath with exertion or at rest.             + productive cough,  little non-productive cough,  No- coughing up of blood.              + change in color of mucus.  +wheezing.   Skin: No-   rash or lesions. GI:  No-   heartburn, indigestion, abdominal pain, nausea, vomiting,  GU:   MS:  No-   joint pain or swelling.   Neuro-     nothing unusual Psych:  No- change in mood or affect. No depression or anxiety.  No memory loss.   Objective:   Physical Exam General- Alert, Oriented, Affect-appropriate, Distress- none acute Skin- rash-none, lesions- none, excoriation- none. Always has a somewhat sunburned look. Lymphadenopathy- none Head- atraumatic            Eyes- Gross vision intact, PERRLA, conjunctivae clear secretions            Ears- Hearing, canals-normal            Nose- stuffy +, no-Septal dev, mucus, polyps, erosion, perforation  Throat- Mallampati II , mucosa clear , drainage- none, tonsils- atrophic Neck- flexible , trachea midline, no stridor , thyroid nl, carotid no bruit Chest - symmetrical excursion , unlabored           Heart/CV- RRR , no murmur , no gallop  , no rub, nl s1 s2                           - JVD- none , edema- none, stasis changes- none, varices- none           Lung- rhonchi + Left,  Wheeze-+ bilateral, cough-none dullness-none, rub- none. Unlabored           Chest wall-  Abd- +3 cm fluctuant subcutaneous  swelling right groin Br/ Gen/ Rectal- Not done, not indicated Extrem- cyanosis- none, clubbing, none, atrophy- none, strength- nl Neuro- grossly intact to observation

## 2014-12-26 NOTE — Patient Instructions (Signed)
Scripts printed for refills and for the Zpak  The Zpak might interact with your citalopram to affect your heart rhythm. If you notice problems with your heart beat then stop the Z pak.  See your surgeons about the fluid seroma

## 2014-12-27 ENCOUNTER — Other Ambulatory Visit: Payer: Self-pay | Admitting: Dermatology

## 2014-12-27 NOTE — Assessment & Plan Note (Signed)
Not sure if she has a mild upper respiratory infection, pollen rhinitis or both, plan-Z-Pak, fluids

## 2014-12-27 NOTE — Assessment & Plan Note (Signed)
Plan-discussed goals and basic management. Refill rescue inhaler.

## 2014-12-28 ENCOUNTER — Encounter: Payer: Self-pay | Admitting: *Deleted

## 2014-12-28 ENCOUNTER — Telehealth: Payer: Self-pay | Admitting: Internal Medicine

## 2014-12-28 NOTE — Telephone Encounter (Signed)
Spoke with patient and gave her verbal instructions for Suprep which she received instead of Moviprep.

## 2014-12-30 ENCOUNTER — Ambulatory Visit (INDEPENDENT_AMBULATORY_CARE_PROVIDER_SITE_OTHER)
Admission: RE | Admit: 2014-12-30 | Discharge: 2014-12-30 | Disposition: A | Discharge status: Home / Self Care | Payer: Medicare Other | Source: Ambulatory Visit | Attending: Internal Medicine | Admitting: Internal Medicine

## 2014-12-30 ENCOUNTER — Ambulatory Visit (AMBULATORY_SURGERY_CENTER): Payer: Medicare Other | Admitting: Internal Medicine

## 2014-12-30 ENCOUNTER — Encounter: Payer: Self-pay | Admitting: Internal Medicine

## 2014-12-30 ENCOUNTER — Other Ambulatory Visit: Payer: Self-pay | Admitting: *Deleted

## 2014-12-30 VITALS — BP 95/59 | HR 87 | Resp 24 | Ht 65.0 in | Wt 132.0 lb

## 2014-12-30 DIAGNOSIS — C189 Malignant neoplasm of colon, unspecified: Secondary | ICD-10-CM

## 2014-12-30 DIAGNOSIS — D122 Benign neoplasm of ascending colon: Secondary | ICD-10-CM | POA: Diagnosis not present

## 2014-12-30 DIAGNOSIS — D124 Benign neoplasm of descending colon: Secondary | ICD-10-CM

## 2014-12-30 MED ORDER — SODIUM CHLORIDE 0.9 % IV SOLN: 500.0000 mL | INTRAVENOUS | Status: DC

## 2014-12-30 NOTE — Progress Notes (Signed)
Called to room to assist during endoscopic procedure.  Patient ID and intended procedure confirmed with present staff. Received instructions for my participation in the procedure from the performing physician.  

## 2014-12-30 NOTE — Progress Notes (Signed)
Pt bp was low on admittance, 88/59, hr 115, started IV gave NS, after about 5 mins rechecked bp 90/60, pt denies any symptoms at this time, No dizziness(light headness), no chest pain, No nausea, pt reports pain in upper r thigh from cyst that was lanced yesterday 4/28 that is bandaged with purulent drainage on outside of dressing, pt rates is 8/10 but says it is better from yesterday where it was 10/10. Rechecked pt after 400 NS given BP was 88/56. Pt still denies any symptoms, Holland Falling CRNA took pt to procedure room, spoke with Dr Olevia Perches in hall and advised of BP, she discussed with [facility] and ok'd to start procedure-adm

## 2014-12-30 NOTE — Op Note (Signed)
[facility]  [address] [place], [address]   COLONOSCOPY PROCEDURE REPORT  PATIENT: Lori, [name]  MR#: [id number] BIRTHDATE: 1965-09-26 , 48  yrs. old GENDER: female ENDOSCOPIST: Lafayette Dragon, MD REFERRED BY:Dr Marianne Sofia PROCEDURE DATE:  12/30/2014 PROCEDURE:   Colonoscopy, surveillance , Colonoscopy with cold biopsy polypectomy, Colonoscopy with snare polypectomy, and Submucosal injection, any substance First Screening Colonoscopy - Avg.  risk and is 50 yrs.  old or older - No.  Prior Negative Screening - Now for repeat screening. N/A  History of Adenoma - Now for follow-up colonoscopy & has been > or = to 3 yrs.  N/A ASA CLASS:   Class II INDICATIONS:recent colonoscopy on 11/24/2014 showed the 3 descending colon polyps one of them contained adenocarcinoma.  The patient is having repeat colonoscopy for the purpose of marking the cancerous polyp with tattoo and Endo Clip.Marland Kitchen MEDICATIONS: Monitored anesthesia care and Propofol 260 mg IV  DESCRIPTION OF PROCEDURE:   After the risks benefits and alternatives of the procedure were thoroughly explained, informed consent was obtained.  The digital rectal exam revealed no abnormalities of the rectum.   The LB [id number]-[id number]  endoscope was introduced through the anus and advanced to the cecum, which was identified by both the appendix and ileocecal valve. No adverse events experienced.   The quality of the prep was excellent. (MoviPrep was used)  The instrument was then slowly withdrawn as the colon was fully examined.      COLON FINDINGS: Three sessile polyps measuring 5 mm in size were found in the descending colon and ascending colon. the ascending colon polyp was located at 80 cm from the rectum it was very small and it was snared and biopsied and sent to pathology it was most likely the polyp that had adenocarcinoma in it .The remaining polypectomy sites   were no longer apparent. The  polypectomy site was tattooed with 2 cc of SPOT and an Endo Clip was placed to mark the area. There were 2additional polyps in the descending colon both 4-5 mm in size at 40 and 50 cm and both were removed with combination of snare and biopsies these polyps were clearly benign. Retroflexed views revealed no abnormalities. The time to cecum = 4.58 Withdrawal time = 21.52   The scope was withdrawn and the procedure completed. COMPLICATIONS: There were no immediate complications.  ENDOSCOPIC IMPRESSION: 5 mm residual polyp in the ascending colon removed tattoo and Endo Clip placed at 80 cm from the rectum, most likely represents the residual polypectomy site 2 additional benign-appearing polyps in descending colon at 40 and 50 cm removed  RECOMMENDATIONS: Await pathology results KUB today to confirm  the placement of the clips further recommendations pending path report  eSigned:  Lafayette Dragon, MD 12/30/2014 8:31 AM   cc:   PATIENT NAME:  Lori, [name] MR#: [id number]

## 2014-12-30 NOTE — Progress Notes (Signed)
To recovery, report to McCoy, RN, VSS 

## 2014-12-30 NOTE — Patient Instructions (Signed)
Discharge instructions given. Card for endo clip given to patient. KUB today. Handout on polyp. Resume previous medications. YOU HAD AN ENDOSCOPIC PROCEDURE TODAY AT [facility]:   Refer to the procedure report that was given to you for any specific questions about what was found during the examination.  If the procedure report does not answer your questions, please call your gastroenterologist to clarify.  If you requested that your care partner not be given the details of your procedure findings, then the procedure report has been included in a sealed envelope for you to review at your convenience later.  YOU SHOULD EXPECT: Some feelings of bloating in the abdomen. Passage of more gas than usual.  Walking can help get rid of the air that was put into your GI tract during the procedure and reduce the bloating. If you had a lower endoscopy (such as a colonoscopy or flexible sigmoidoscopy) you may notice spotting of blood in your stool or on the toilet paper. If you underwent a bowel prep for your procedure, you may not have a normal bowel movement for a few days.  Please Note:  You might notice some irritation and congestion in your nose or some drainage.  This is from the oxygen used during your procedure.  There is no need for concern and it should clear up in a day or so.  SYMPTOMS TO REPORT IMMEDIATELY:   Following lower endoscopy (colonoscopy or flexible sigmoidoscopy):  Excessive amounts of blood in the stool  Significant tenderness or worsening of abdominal pains  Swelling of the abdomen that is new, acute  Fever of 100F or higher   For urgent or emergent issues, a gastroenterologist can be reached at any hour by calling [phone].   DIET: Your first meal following the procedure should be a small meal and then it is ok to progress to your normal diet. Heavy or fried foods are harder to digest and may make you feel nauseous or bloated.  Likewise, meals heavy in  dairy and vegetables can increase bloating.  Drink plenty of fluids but you should avoid alcoholic beverages for 24 hours.  ACTIVITY:  You should plan to take it easy for the rest of today and you should NOT DRIVE or use heavy machinery until tomorrow (because of the sedation medicines used during the test).    FOLLOW UP: Our staff will call the number listed on your records the next business day following your procedure to check on you and address any questions or concerns that you may have regarding the information given to you following your procedure. If we do not reach you, we will leave a message.  However, if you are feeling well and you are not experiencing any problems, there is no need to return our call.  We will assume that you have returned to your regular daily activities without incident.  If any biopsies were taken you will be contacted by phone or by letter within the next 1-3 weeks.  Please call us at [phone] if you have not heard about the biopsies in 3 weeks.    SIGNATURES/CONFIDENTIALITY: You and/or your care partner have signed paperwork which will be entered into your electronic medical record.  These signatures attest to the fact that that the information above on your After Visit Summary has been reviewed and is understood.  Full responsibility of the confidentiality of this discharge information lies with you and/or your care-partner.

## 2015-01-02 ENCOUNTER — Telehealth: Payer: Self-pay

## 2015-01-02 NOTE — Telephone Encounter (Signed)
  Follow up Call-  Call back number 12/30/2014 11/24/2014  Post procedure Call Back phone  # [phone]  Permission to leave phone message Yes Yes     Patient questions:  Do you have a fever, pain , or abdominal swelling? No. Pain Score  0 *  Have you tolerated food without any problems? Yes.    Have you been able to return to your normal activities? Yes.    Do you have any questions about your discharge instructions: Diet   No. Medications  No. Follow up visit  No.  Do you have questions or concerns about your Care? No.  Actions: * If pain score is 4 or above: No action needed, pain <4.

## 2015-01-03 ENCOUNTER — Encounter: Payer: Self-pay | Admitting: Internal Medicine

## 2015-01-04 ENCOUNTER — Telehealth: Payer: Self-pay | Admitting: Oncology

## 2015-01-04 ENCOUNTER — Ambulatory Visit: Payer: 59 | Admitting: Oncology

## 2015-01-04 NOTE — Telephone Encounter (Signed)
Patient called to reschedule appointment. Confirmed appointment for 05/25.

## 2015-01-13 ENCOUNTER — Telehealth: Payer: Self-pay | Admitting: Internal Medicine

## 2015-01-13 NOTE — Telephone Encounter (Signed)
Results discussed with pt and letter mailed to pt.

## 2015-01-25 ENCOUNTER — Telehealth: Payer: Self-pay | Admitting: Oncology

## 2015-01-25 ENCOUNTER — Ambulatory Visit (HOSPITAL_BASED_OUTPATIENT_CLINIC_OR_DEPARTMENT_OTHER): Payer: Medicare Other | Admitting: Oncology

## 2015-01-25 VITALS — BP 115/72 | HR 93 | Temp 97.7°F | Resp 18 | Ht 65.0 in | Wt 127.3 lb

## 2015-01-25 DIAGNOSIS — C189 Malignant neoplasm of colon, unspecified: Secondary | ICD-10-CM

## 2015-01-25 DIAGNOSIS — C4371 Malignant melanoma of right lower limb, including hip: Secondary | ICD-10-CM

## 2015-01-25 DIAGNOSIS — C182 Malignant neoplasm of ascending colon: Secondary | ICD-10-CM | POA: Diagnosis not present

## 2015-01-25 DIAGNOSIS — Z85118 Personal history of other malignant neoplasm of bronchus and lung: Secondary | ICD-10-CM

## 2015-01-25 DIAGNOSIS — C3490 Malignant neoplasm of unspecified part of unspecified bronchus or lung: Secondary | ICD-10-CM

## 2015-01-25 NOTE — Progress Notes (Signed)
[facility]  Telephone:([phone]) [phone] Fax:(336) [phone]     ID: LEVONNE CARRERAS DOB: 08/30/1966  MR#: [id number]  QMV#:[id number]  Patient Care Team: [name], DO as PCP - General Deneise Lever, MD (Pulmonary Disease) PCP: [name], DO GYN: SU:  OTHER MD: Griselda Miner M.D., Rob Hickman M.D., Georganna Skeans MD, [name] M.D.  CHIEF COMPLAINT: malignant melanoma, colon carcinoma  CURRENT TREATMENT: Observation   HISTORY OF PRESENT ILLNESS:  From the prior summary:  I know Ms. Morr from her remote history of lung carcinoma, which has not recurred. More recently, her significant other [name] noticed a lesion in her right posterior calf. He keeps a picture Atlas of melanoma lesions on his bathroom wall, and told her he was "99% sure" the lesion there would be a melanoma. The patient brought it to Dr. Marjean Donna attention and a punch biopsy was obtained 09/06/2014. This showed ([id number]) malignant melanoma, with a maximum thickness of 0.33 mm, anatomic level III, no ulceration, with negative deep margins but clear peripheral margins. The mitotic index was low, less than one per square millimeter. There was no evidence of lymphovascular invasion. There was brisk tumor infiltrating lymphocytes. Tumor regression was noted across the full aspect of the punch biopsy.   This was read as a pathologic stage TIa, and the patient was referred to Dr. Sarajane Jews for wide excision, performed 09/13/2014. Dr. Everett Graff note indicates a 1 cm margin around the initial biopsy site. The final pathology ([id number]) showed a tumor thickness of 1.15, anatomic level III, with ulceration seen only in the area of prior biopsy, (and therefore read as negative). Peripheral as well as deep margins were free. Again the mitotic index was low there was no evidence of vascular invasion and tumor infiltrating lymphocytes were brisk. This was read as a pT2a lesion.   The  patient is referred for further evaluation and treatment.  INTERVAL HISTORY: Tyjae returns today for follow-up of her melanoma and colon cancers. Since her last visit here she underwent sentinel lymph node sampling in the right groin area. She had significant problems with swelling and drainage, but those have resolved. There is some scarring that concerns her. In addition, she had a colonoscopy showed adenocarcinoma in an ascending colon polyp. Repeat colonoscopy in late April identified the area and removed a polyp which may or may not have been the one with carcinoma (the final pathology did not show carcinoma in the repeat biopsy). At this point the plan is for repeat colonoscopy in 6 months  REVIEW OF SYSTEMS: Continues to Have Problems with Insomnia, Complaints of Shortness of Breath with Moderate Activity, and Has Stress Urinary Incontinence. She Has Joint Pains Here in There Which Are Not More Persistent or Intense Than before. She Has Rare Headaches. She Feels Anxious but Not Depressed. A Detailed Review of Systems Today Was Otherwise Stable.  PAST MEDICAL HISTORY: Past Medical History  Diagnosis Date  . External hemorrhoids   . Allergic rhinitis   . Idiopathic interstitial pneumonia, not otherwise specified   . Chronic bronchitis   . History of lung cancer   . Panic attacks   . Anxiety disorder   . Dyspepsia   . Insomnia   . Goiter   . Migraine, unspecified, without mention of intractable migraine without mention of status migrainosus   . Female stress incontinence   . Dizziness and giddiness   . Lung cancer   . EXTERNAL HEMORRHOIDS 09/27/2009  . Adhesive capsulitis  05/22/2011  . Skin cancer (melanoma)     right calf  . Hypercholesteremia     denies-last check normal labs  . Seizures     very long ago like 15 years ago or so , questionable etilogy  . GERD (gastroesophageal reflux disease)     occ depending on diet   . COPD (chronic obstructive pulmonary disease)   .  Cough     PAST SURGICAL HISTORY: Past Surgical History  Procedure Laterality Date  . Bronchoscopy  01/31/2005    nonspecific inflammation  . Spirometry  07/03/2002    min obstruction,mild restriction 01/31/2005  . Tvh  12/01/2000    hysterectomy  . Portacath removed  10/2007  . Biopsy of right neck mass      negative-thyroid biopsy   . Bronchoscopy  08/24/2010    nonspecific inflammation  . Lung removal, partial      for lung cancer--followed by Dr. Arlyce Dice and Dr. Annamaria Boots  . Melanoma removal      right calf   . Melanoma excision with sentinel lymph node biopsy Right 11/29/2014    Procedure: RIGHT INGUINAL SENTINEL LYMPH NODE BIOPSY;  Surgeon: Georganna Skeans, MD;  Location: [facility];  Service: General;  Laterality: Right;    FAMILY HISTORY Family History  Problem Relation Age of Onset  . Lung cancer Father   . Diabetes Maternal Grandfather   . Colon polyps Brother   . Colon cancer Neg Hx    the patient's father died in his mid 22s from lung cancer. The patient's mother is living, in her 52s. The patient has one brother, no sisters. There is no other history of cancer in the family to her knowledge  GYNECOLOGIC HISTORY:  No LMP recorded. Patient has had a hysterectomy. Menarche age 12, first live birth age 83. The patient is GX P1. She had a hysterectomy approximately 2001. She did not undergo salpingo-oophorectomy. She did not take hormone replacement  SOCIAL HISTORY:  She is disabled and lives by herself, with a cat. Her significant other, Aaron Edelman, works as a Chief Strategy Officer. Her daughter Theadora Rama, 22 years old as of March 2016, lives in [place] and in addition to working has 4 children.    ADVANCED DIRECTIVES: Not in place. The patient intends to name her mother Worthy Flank as healthcare power of attorney. She can be reached at [phone]. At the 11/07/2014 visit patient received the appropriate documents 2 complete and notarize at her discretion   HEALTH MAINTENANCE: History  Substance  Use Topics  . Smoking status: Former Smoker    Types: Cigarettes    Quit date: 10/26/2001  . Smokeless tobacco: Never Used     Comment: Significant passive exposure from boy friend  . Alcohol Use: No     Comment: history of alcohol abuse 20-30     Colonoscopy: Due  PAP: Status post hysterectomy  Bone density:  Lipid panel:  No Known Allergies  Current Outpatient Prescriptions  Medication Sig Dispense Refill  . acetaminophen (TYLENOL) 650 MG CR tablet Take 650 mg by mouth every 8 (eight) hours as needed for pain.    Marland Kitchen albuterol (PROAIR HFA) 108 (90 BASE) MCG/ACT inhaler 1-2 puffs every 4-6 hours as needed for wheezing 1 Inhaler prn  . albuterol (PROVENTIL) (2.5 MG/3ML) 0.083% nebulizer solution Take 3 mLs (2.5 mg total) by nebulization every 6 (six) hours as needed for wheezing or shortness of breath. (Patient not taking: Reported on 12/30/2014) 75 vial prn  . ALPRAZolam (XANAX) 0.5 MG tablet Take  1 tablet (0.5 mg total) by mouth 3 (three) times daily as needed for anxiety. 90 tablet 5  . azithromycin (ZITHROMAX) 250 MG tablet 2 today then one daily (Patient not taking: Reported on 12/30/2014) 6 tablet 0  . citalopram (CELEXA) 20 MG tablet Take 1 tablet (20 mg total) by mouth daily. 30 tablet 3  . Fluticasone Furoate-Vilanterol (BREO ELLIPTA) 100-25 MCG/INH AEPB Inhale 1 puff into the lungs daily. Rinse after each use 1 each prn  . polyethylene glycol powder (GLYCOLAX/MIRALAX) powder Take 1 capful mixed in 8 ounces of liquid 1 to 2 times daily. (Patient not taking: Reported on 12/30/2014) 3350 g 1  . topiramate (TOPAMAX) 50 MG tablet Take 1.5 tablets (75 mg total) by mouth 2 (two) times daily. 90 tablet 5   No current facility-administered medications for this visit.    OBJECTIVE: Middle-aged white woman in no acute distress Filed Vitals:   01/25/15 0929  BP: 115/72  Pulse: 93  Temp: 97.7 F (36.5 C)  Resp: 18     Body mass index is 21.18 kg/(m^2).    ECOG FS:1 - Symptomatic but  completely ambulatory  Sclerae unicteric, pupils round and equal Oropharynx clear and moist-- no thrush or other lesions No cervical or supraclavicular adenopathy Lungs no rales or rhonchi Heart regular rate and rhythm Abd soft, nontender, positive bowel sounds MSK no focal spinal tenderness, no right lower extremity lymphedema Neuro: nonfocal, well oriented, appropriate affect Breasts: No masses palpated in either breast, no skin or nipple changes of concern, both axillae are benign. Skin: The scar from the right groin incision has healed with minimal skin distortion and slight induration beneath the scar, but no erythema or swelling.  LAB RESULTS:  CMP     Component Value Date/Time   NA 140 11/25/2014 0901   NA 142 11/07/2014 1045   NA 148* 07/09/2011 1413   K 3.6 11/25/2014 0901   K 3.6 11/07/2014 1045   K 3.5 07/09/2011 1413   CL 109 11/25/2014 0901   CL 111* 07/09/2012 0958   CL 100 07/09/2011 1413   CO2 26 11/25/2014 0901   CO2 24 11/07/2014 1045   CO2 26 07/09/2011 1413   GLUCOSE 84 11/25/2014 0901   GLUCOSE 75 11/07/2014 1045   GLUCOSE 80 07/09/2012 0958   GLUCOSE 96 07/09/2011 1413   BUN 7 11/25/2014 0901   BUN 8.7 11/07/2014 1045   BUN 11 07/09/2011 1413   CREATININE 0.66 11/25/2014 0901   CREATININE 0.8 11/07/2014 1045   CREATININE 0.77 05/13/2013 1052   CALCIUM 8.9 11/25/2014 0901   CALCIUM 8.9 11/07/2014 1045   CALCIUM 8.9 07/09/2011 1413   PROT 7.5 11/07/2014 1045   PROT 6.7 05/13/2013 1052   PROT 7.9 07/09/2011 1413   ALBUMIN 3.8 11/07/2014 1045   ALBUMIN 3.7 05/13/2013 1052   AST 22 11/07/2014 1045   AST 17 05/13/2013 1052   AST 17 07/09/2011 1413   ALT 17 11/07/2014 1045   ALT 11 05/13/2013 1052   ALT 16 07/09/2011 1413   ALKPHOS 72 11/07/2014 1045   ALKPHOS 66 05/13/2013 1052   ALKPHOS 94* 07/09/2011 1413   BILITOT 0.45 11/07/2014 1045   BILITOT 0.6 05/13/2013 1052   BILITOT 1.00 07/09/2011 1413   GFRNONAA >90 11/25/2014 0901   GFRAA >90  11/25/2014 0901    INo results found for: SPEP, UPEP  Lab Results  Component Value Date   WBC 6.9 11/25/2014   NEUTROABS 5.8 11/07/2014   HGB 13.5 11/25/2014  HCT 38.0 11/25/2014   MCV 85.2 11/25/2014   PLT 194 11/25/2014      Chemistry      Component Value Date/Time   NA 140 11/25/2014 0901   NA 142 11/07/2014 1045   NA 148* 07/09/2011 1413   K 3.6 11/25/2014 0901   K 3.6 11/07/2014 1045   K 3.5 07/09/2011 1413   CL 109 11/25/2014 0901   CL 111* 07/09/2012 0958   CL 100 07/09/2011 1413   CO2 26 11/25/2014 0901   CO2 24 11/07/2014 1045   CO2 26 07/09/2011 1413   BUN 7 11/25/2014 0901   BUN 8.7 11/07/2014 1045   BUN 11 07/09/2011 1413   CREATININE 0.66 11/25/2014 0901   CREATININE 0.8 11/07/2014 1045   CREATININE 0.77 05/13/2013 1052      Component Value Date/Time   CALCIUM 8.9 11/25/2014 0901   CALCIUM 8.9 11/07/2014 1045   CALCIUM 8.9 07/09/2011 1413   ALKPHOS 72 11/07/2014 1045   ALKPHOS 66 05/13/2013 1052   ALKPHOS 94* 07/09/2011 1413   AST 22 11/07/2014 1045   AST 17 05/13/2013 1052   AST 17 07/09/2011 1413   ALT 17 11/07/2014 1045   ALT 11 05/13/2013 1052   ALT 16 07/09/2011 1413   BILITOT 0.45 11/07/2014 1045   BILITOT 0.6 05/13/2013 1052   BILITOT 1.00 07/09/2011 1413       Lab Results  Component Value Date   LABCA2 26 07/21/2007    No components found for: [id number]  No results for input(s): INR in the last 168 hours.  Urinalysis    Component Value Date/Time   LABSPEC 1.005 07/16/2012 1207   PHURINE 6.0 07/16/2012 1207   HGBUR Negative 07/16/2012 1207   BILIRUBINUR Negative 07/16/2012 1207   KETONESUR Negative 07/16/2012 1207   PROTEINUR Negative 07/16/2012 1207   NITRITE Negative 07/16/2012 1207   LEUKOCYTESUR Trace 07/16/2012 1207    STUDIES: Dg Abd 1 View  12/30/2014   CLINICAL DATA:  Colonoscopy performed this morning. Evaluate endo clip placement.  EXAM: ABDOMEN - 1 VIEW  COMPARISON:  None.  FINDINGS: A small clip projects  over the right iliac bone, in the vicinity of the ascending colon.  Bowel gas pattern unremarkable on today's supine radiograph. No significant abnormal calcifications.  IMPRESSION: 1. A metal clip projects over the right lower quadrant, in the vicinity of the ascending colon.   Electronically Signed   By: Van Clines M.D.   On: 12/30/2014 09:02    ASSESSMENT: 49 y.o. [place] woman  A: LUNG CANCER  (1) status post right upper lobe wedge resection of a T1 N2, stage IIIA non-small cell lung cancer (adenocarcinoma) measuring 1.8 cm, poorly differentiated, with negative margins, but with 4 positive lymph nodes, 3 N1 and 1 N2; on Iressa since April of this year.    (a) on Iressa 2004 to 2009  (b) no evidence of disease recurrence on most recent chest x-ray 08/19/2014, and likely cured  (2) ashthmatic bronchitis/ COPD  (3) tobacco abuse: The patient quit smoking 2001  B: MALIGNANT MELANOMA  (4) s/p punch biopsy Right posterior calf for malignant melanoma 0.33 mm thickness, level III, w/o ulceration and with a low mitotic index, free deep but positive lateral margins  (5) status post wide excision 09/13/2014 showing a maximum tumor thickness of 1.15 mm, and anatomic level III, with ulceration noted only at the site of prior biopsy, with clear margins( 1 cm per surgical note) with a low mitotic index and brisk  infiltration by tumor infiltrating lymphocytes: pT2a NX MX = stage IB  (6) right inguinal sentinel lymph node biopsy 11/29/2014 showed no evidence of melanoma  C: COLON CANCER (7) colonoscopy 11/29/2014 shows adenocarcinoma in  ascending colon polyp; repeat colonoscopy 12/30/2014 removed a tubular adenoma in that area which was negative for malignancy. The area was marked for possible definitive surgery  (a) Repeat colonoscopy planned    PLAN:  consent has completed the treatment for her malignant melanoma, which proved to be node negative. This requires only follow-up.  The  early stage colon cancer will be looked at again with repeat anoscopy in November. Accordingly I am going to see her again in December. Prior to that visit we will obtain a restaging CT scan of the chest.  Venita has a good understanding of the overall plan. She agrees with it. She knows the goal of treatment in her case is cure. She will call with any problems that may develop before her next visit here.  Chauncey Cruel, MD   01/25/2015 9:40 AM Medical Oncology and Hematology [facility] [address] [place], [place] [address] Tel. [phone]    Fax. [phone]

## 2015-01-25 NOTE — Telephone Encounter (Signed)
avs printed for patient,patient will call dr Olevia Perches for her repeat colon

## 2015-03-15 ENCOUNTER — Other Ambulatory Visit: Payer: Self-pay | Admitting: Family Medicine

## 2015-06-07 ENCOUNTER — Ambulatory Visit (INDEPENDENT_AMBULATORY_CARE_PROVIDER_SITE_OTHER): Payer: Medicare Other | Admitting: Family Medicine

## 2015-06-07 ENCOUNTER — Encounter: Payer: Self-pay | Admitting: Family Medicine

## 2015-06-07 VITALS — BP 114/69 | HR 81 | Temp 97.8°F | Ht 65.0 in | Wt 132.1 lb

## 2015-06-07 DIAGNOSIS — M799 Soft tissue disorder, unspecified: Secondary | ICD-10-CM | POA: Diagnosis not present

## 2015-06-07 DIAGNOSIS — K5900 Constipation, unspecified: Secondary | ICD-10-CM | POA: Diagnosis not present

## 2015-06-07 DIAGNOSIS — M7989 Other specified soft tissue disorders: Secondary | ICD-10-CM

## 2015-06-07 DIAGNOSIS — F411 Generalized anxiety disorder: Secondary | ICD-10-CM | POA: Diagnosis not present

## 2015-06-07 DIAGNOSIS — G43909 Migraine, unspecified, not intractable, without status migrainosus: Secondary | ICD-10-CM | POA: Diagnosis not present

## 2015-06-07 DIAGNOSIS — Z23 Encounter for immunization: Secondary | ICD-10-CM

## 2015-06-07 MED ORDER — TOPIRAMATE 50 MG PO TABS: ORAL_TABLET | ORAL | Status: DC

## 2015-06-07 MED ORDER — ALPRAZOLAM 0.5 MG PO TABS: 0.5000 mg | ORAL_TABLET | Freq: Three times a day (TID) | ORAL | Status: DC | PRN

## 2015-06-07 MED ORDER — POLYETHYLENE GLYCOL 3350 17 GM/SCOOP PO POWD
ORAL | Status: DC
Start: 2015-06-07 — End: 2015-11-29

## 2015-06-07 MED ORDER — POLYETHYLENE GLYCOL 3350 17 GM/SCOOP PO POWD
ORAL | Status: DC
Start: 2015-06-07 — End: 2015-06-07

## 2015-06-07 NOTE — Progress Notes (Signed)
    Subjective: CC: depression/anxiety HPI: Patient is a 49 y.o. female presenting to clinic today for follow up appointment. Concerns today include:  1. Depression Patient reports that she has been compliant on Celexa.  She took medication for 2 months but reports that she felt "foggy on medication".  She reports that depression has been controlled without Celexa.    2. Anxiety Patient still taking Xanax.  Well controlled on this medication.  She notes that she has some stressors in life currently, family member had an aneurysm.  Patient also with recent dx of malignant melanoma and adenocarcinoma of colon.  3. Colon cancer Patient to follow up for repeat colonoscopy in November.  Occasional rectal bleeding.  Miralax is working well.  Patient denies weight loss, fevers, chills.  4. Migraine Well controlled on Topamax.  Last migraine about 4 months ago.  No headache, dizziness, vision changes.  5. Lump Patient reports a lump on her L flank that she noticed abotu 6 months ago.  She does not feel that it is getting any larger but is understandably concerned given h/o cancer.  She denies weight loss, fever, chills, fatigue.  Social History Reviewed: former smoker. FamHx and MedHx updated.  Please see EMR. Health Maintenance: Flu shot due.  ROS: All other systems reviewed and are negative.  Objective: Office vital signs reviewed. BP 114/69 mmHg  Pulse 81  Temp(Src) 97.8 F (36.6 C) (Oral)  Wt 132 lb 1.6 oz (59.92 kg)  Physical Examination:  General: Awake, alert, well nourished, well appearing, NAD HEENT: Normal, MMM Cardio: RRR, S1S2 heard, no murmurs appreciated Pulm: CTAB, no wheezes, rhonchi or rales, normal WOB Extremities: WWP, No edema, cyanosis or clubbing; +2 pulses bilaterally MSK: Normal gait and station Skin: dry, intact, no rashes, kidney bean sized soft, mobile, well circumscribed soft tissue mass appreciated on L flank around Rib 9-10.  Mass is non TTP.  No  superficial skin changes appreciated Psych: mood stable, good eye contact, speech normal  Assessment/ Plan: 49 y.o. female with  1. Constipation, unspecified constipation type.  Seems to be doing well with Miralax - polyethylene glycol powder (GLYCOLAX/MIRALAX) powder; Take 1 capful mixed in 8 ounces of liquid 1 to 2 times daily.  Dispense: 3350 g; Refill: 11  2. Generalized anxiety disorder.  Doing well with Xanax.  No red flags. - ALPRAZolam (XANAX) 0.5 MG tablet; Take 1 tablet (0.5 mg total) by mouth 3 (three) times daily as needed for anxiety.  Dispense: 90 tablet; Refill: 5 - Celexa discontinued 2/2 intolerance.  Will consider alternative if patient experience worsening depression/anxiety.  3. Migraine without status migrainosus, not intractable, unspecified migraine type.  Controlled with Topamax - topiramate (TOPAMAX) 50 MG tablet; TAKE 1 1/2 TABLET BY MOUTH TWICE DAILY.  Dispense: 270 tablet; Refill: 3  4. Soft tissue mass.  Seems like an inclusion cyst but given h/o Malignant melanoma and adenocarcinoma of the colon, think it warrants a closer look by derm. - Recommend follow up with Derm - Return precautions reviewed.  5. Encounter for immunization - Flu shot today   [name], DO PGY-2, [facility]

## 2015-06-07 NOTE — Patient Instructions (Signed)
Make your appointment with your GI doctor for November.  Make sure that mention your soft tissue mass (i think this is a benign cyst) to your Dermatologist at your appointment this month.

## 2015-06-21 ENCOUNTER — Encounter: Payer: Self-pay | Admitting: Gastroenterology

## 2015-06-27 ENCOUNTER — Encounter: Payer: Self-pay | Admitting: Internal Medicine

## 2015-06-27 ENCOUNTER — Ambulatory Visit (INDEPENDENT_AMBULATORY_CARE_PROVIDER_SITE_OTHER): Payer: Medicare Other | Admitting: Internal Medicine

## 2015-06-27 VITALS — BP 108/68 | HR 94 | Ht 65.0 in | Wt 136.6 lb

## 2015-06-27 DIAGNOSIS — J4541 Moderate persistent asthma with (acute) exacerbation: Secondary | ICD-10-CM

## 2015-06-27 DIAGNOSIS — C3490 Malignant neoplasm of unspecified part of unspecified bronchus or lung: Secondary | ICD-10-CM | POA: Diagnosis not present

## 2015-06-27 MED ORDER — ALBUTEROL SULFATE 108 (90 BASE) MCG/ACT IN AEPB
2.0000 | INHALATION_SPRAY | Freq: Four times a day (QID) | RESPIRATORY_TRACT | Status: DC | PRN
Start: 2015-06-27 — End: 2016-06-28

## 2015-06-27 MED ORDER — LEVALBUTEROL HCL 0.63 MG/3ML IN NEBU
0.6300 mg | INHALATION_SOLUTION | Freq: Once | RESPIRATORY_TRACT | Status: AC
Start: 2015-06-27 — End: 2015-06-27
  Administered 2015-06-27: 0.63 mg via RESPIRATORY_TRACT

## 2015-06-27 MED ORDER — FLUTICASONE FUROATE-VILANTEROL 100-25 MCG/INH IN AEPB
1.0000 | INHALATION_SPRAY | Freq: Every day | RESPIRATORY_TRACT | Status: DC
Start: 2015-06-27 — End: 2016-08-14

## 2015-06-27 MED ORDER — ALBUTEROL SULFATE (2.5 MG/3ML) 0.083% IN NEBU: 2.5000 mg | INHALATION_SOLUTION | Freq: Four times a day (QID) | RESPIRATORY_TRACT | Status: DC | PRN

## 2015-06-27 MED ORDER — AZITHROMYCIN 250 MG PO TABS: ORAL_TABLET | ORAL | Status: DC

## 2015-06-27 MED ORDER — METHYLPREDNISOLONE ACETATE 80 MG/ML IJ SUSP
80.0000 mg | Freq: Once | INTRAMUSCULAR | Status: AC
  Administered 2015-06-27: 80 mg via INTRAMUSCULAR

## 2015-06-27 NOTE — Patient Instructions (Signed)
Script and sample for ProAir Respiclick   2 puffs every 4-6 hours as needed albuterol rescue inhaler  Scripts printed refilling neb solution and Breo  Script printed Z pak  Neb xop 0.63  Depo 80

## 2015-06-27 NOTE — Progress Notes (Signed)
Patient ID: [name], female    DOB: Jan 06, 1966, 49 y.o.   MRN: [id number]  HPI 50 yo former heavy smoker followed after RUL lobectomy for lung cancer, chronic bronchitis, allergic rhinitis, recent diffuse interstitial process. Dr Jana Hakim recently saw her for Oncology f/u and repeated CT 11/09/10 - images reviewed. There has been a decrease in the numerous cavitating nodular infiltrates since November comparison. This improved with no treatment. She has seen Dr Arlyce Dice today.  She has been sunning herself, lying outside and blames seasonal pollen for what she considers normal for season mild chest congestion with scant light yellow phlegm. Denies chest pain, fever, night sweats, nodes. Recent PPD skin test was negative. HIV serology was negative. She had chosen to defer bronchoscopy discussed at last visit, because she felt well despite CXR.  02/12/11-  Feels ok. Had a chest cold a couple of weeks ago- largely resolved. Otherwise has felt well. Boyfriend still smokes heavily around her-  makes her cough. Otherwise not noting routine cough, chest pain, palpitation, fever.  06/25/11-44 yo former heavy smoker followed after RUL lobectomy for lung cancer, chronic bronchitis, allergic rhinitis, recent diffuse interstitial process. Cooler air of the fall season and humid air in the shower both cause her chest to tighten. Dr. Arlyce Dice has now released her from his long-term followup. She is coughing and wheezing very little and feels stable. She is still exposed to her boyfriend's cigarette smoke. CXR-06/12/2011-stable post therapy appearance of the chest with nothing new. There is a right hilar/paratracheal mass with surgical clips unchanged from 2010, radiation changes. The widespread pulmonary interstitial opacities which developed over a year ago, are stable and consistent now with scarring.  12/24/11- 62 yo former heavy smoker followed after RUL lobectomy for lung cancer, chronic bronchitis, allergic  rhinitis, recent diffuse interstitial process. Blames pollen for recent tighter chest, mild wheeze, nasal congestion and drainage. Denies fever, pain or blood. Discussed medication refill needs. Educated the difference between Mucinex and antihistamines. Chest x-ray 06/12/2011-reviewed-stable.  06/23/12- 63 yo former heavy smoker followed after RUL lobectomy for lung cancer, chronic bronchitis, allergic rhinitis, recent diffuse interstitial process.  Had flu vaccine Pt c/o increased coughing while showering, pt thinks d/t heat and steam. Pt notices this with severe weather changes as well.  Patient needs printed Rx 30 day supply/refills for Foradil, Xopenex HFA and Neb and Asmanex. Boyfriend still smokes but they got rid of the wood stove. Xopenex has been a big help. Her oncologist will get a followup CT scan but there has been no recurrence of her lung cancer.  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  FOLLOWS FOR: Pt. reports there is no change with her coughing, pt. states she feels stuffy, due to weather change.  Pt. denies any chest tightness or wheezing. Sneezing some from the pollen. Otherwise okay. She has been released for followup by her oncologist after 5 years. medications reviewed. Very sensitive to albuterol-shaky. CT 07/09/12 IMPRESSION:  1. Stable surgical and radiation changes involving the right lung.  No findings for recurrent tumor or pulmonary metastatic disease.  2. 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 Recommend clinical correlation.  Original Report Authenticated By: Marijo Sanes, M.D.  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  FOLLOWS FBP:ZWCHENIDP is about the same however having slight increase in cough(gets this time of year) [name] better as well. Had flu vaccine. 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  FOLLOWS FOR: Pollen is causing her to have cough and congestion. Head congestion, sneeze, some cough-green. No fever. Likes Breo. CT 06/24/13 IMPRESSION:  Stable postsurgical changes and postradiation changes in right upper  lobe. Surgical clips in right hilum again noted. Again noted status  post right upper thoracotomy. No pulmonary edema. No definite  evidence of superimposed infiltrate or recurrent metastatic disease.  Electronically Signed  By: Lahoma Crocker M.D.  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  FOLLOW FOR:  Asthma w/ Bronchitis; sinus pressure; cough, would like cough medication to help her sleep at night Acute- 3 days- cough green, sinus drainage, ? 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 Pollen is making her cough some with slightly yellow sputum, postnasal drainage but she think she's had some low-grade fever, feels badly and thinks she has a cold. Has liked Breo inhaler. CXR 08/19/14 IMPRESSION: 1. No interval change. 2. Stable postsurgical change in  the right hemi thorax. 3. Stable interstitial lung disease. Electronically Signed  By: Suzy Bouchard M.D.  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 Pt states that she has recurring symptoms when the seasons change. Pt states that she uses her albuterol HFA once a day.  Had flu vaccine  Review of Systems-See HPI Constitutional:   No-   weight loss, night sweats, fevers, chills, fatigue, lassitude. HEENT:   No-  headaches, difficulty swallowing, tooth/dental problems, sore throat,      +mild sneezing,no- itching, ear ache, +nasal congestion, post nasal drip,  CV:  No-   chest pain, orthopnea, PND, swelling in lower extremities, anasarca, dizziness, palpitations Resp: No- acute  shortness of breath with exertion or at rest.             + productive cough,  little non-productive cough,  No- coughing up of blood.              + change in color of mucus.  +wheezing.   Skin: No-   rash or lesions. GI:  No-   heartburn, indigestion, abdominal pain, nausea, vomiting,  GU:   MS:  No-   joint pain or swelling.   Neuro-     nothing unusual Psych:  No- change in mood or affect. No depression or anxiety.  No memory loss.   Objective:   Physical Exam General- Alert, Oriented, Affect-appropriate, Distress- none acute Skin- rash-none,  lesions- none, excoriation- none. Always has a somewhat sunburned look. Lymphadenopathy- none Head- atraumatic            Eyes- Gross vision intact, PERRLA, conjunctivae clear secretions            Ears- Hearing, canals-normal            Nose- stuffy +, no-Septal dev, mucus, polyps, erosion, perforation             Throat- Mallampati II , mucosa clear , drainage- none, tonsils-  atrophic Neck- flexible , trachea midline, no stridor , thyroid nl, carotid no bruit Chest - symmetrical excursion , unlabored           Heart/CV- RRR , no murmur , no gallop  , no rub, nl s1 s2                           - JVD- none , edema- none, stasis changes- none, varices- none           Lung- rhonchi + Left,  Wheeze-+ bilateral, cough +, dullness-none, rub- none. Unlabored           Chest wall-  Abd-  Br/ Gen/ Rectal- Not done, not indicated Extrem- cyanosis- none, clubbing, none, atrophy- none, strength- nl Neuro- grossly intact to observation

## 2015-07-25 NOTE — Assessment & Plan Note (Signed)
Long-term follow-up by oncology without recurrence

## 2015-07-25 NOTE — Assessment & Plan Note (Signed)
Exacerbation consistent with acute infection, initially viral. Plan-nebulizer Xopenex, Depo-Medrol, Z-Pak, prescription to try Proair Respiclick

## 2015-08-01 ENCOUNTER — Ambulatory Visit (HOSPITAL_COMMUNITY)
Admission: RE | Admit: 2015-08-01 | Discharge: 2015-08-01 | Disposition: A | Discharge status: Home / Self Care | Payer: Medicare Other | Source: Ambulatory Visit | Attending: Oncology | Admitting: Oncology

## 2015-08-01 ENCOUNTER — Encounter (HOSPITAL_COMMUNITY): Payer: Self-pay

## 2015-08-01 DIAGNOSIS — J432 Centrilobular emphysema: Secondary | ICD-10-CM | POA: Insufficient documentation

## 2015-08-01 DIAGNOSIS — Z85820 Personal history of malignant melanoma of skin: Secondary | ICD-10-CM | POA: Insufficient documentation

## 2015-08-01 DIAGNOSIS — C966 Unifocal Langerhans-cell histiocytosis: Secondary | ICD-10-CM | POA: Insufficient documentation

## 2015-08-01 DIAGNOSIS — Z9221 Personal history of antineoplastic chemotherapy: Secondary | ICD-10-CM | POA: Insufficient documentation

## 2015-08-01 DIAGNOSIS — C189 Malignant neoplasm of colon, unspecified: Secondary | ICD-10-CM

## 2015-08-01 DIAGNOSIS — Z902 Acquired absence of lung [part of]: Secondary | ICD-10-CM | POA: Insufficient documentation

## 2015-08-01 DIAGNOSIS — Z923 Personal history of irradiation: Secondary | ICD-10-CM | POA: Insufficient documentation

## 2015-08-01 DIAGNOSIS — Z85118 Personal history of other malignant neoplasm of bronchus and lung: Secondary | ICD-10-CM | POA: Diagnosis present

## 2015-08-01 DIAGNOSIS — C4371 Malignant melanoma of right lower limb, including hip: Secondary | ICD-10-CM

## 2015-08-01 DIAGNOSIS — C3490 Malignant neoplasm of unspecified part of unspecified bronchus or lung: Secondary | ICD-10-CM

## 2015-08-01 MED ORDER — IOHEXOL 300 MG/ML  SOLN
75.0000 mL | Freq: Once | INTRAMUSCULAR | Status: AC | PRN
  Administered 2015-08-01: 75 mL via INTRAVENOUS

## 2015-08-08 ENCOUNTER — Telehealth: Payer: Self-pay | Admitting: Oncology

## 2015-08-08 ENCOUNTER — Ambulatory Visit (HOSPITAL_BASED_OUTPATIENT_CLINIC_OR_DEPARTMENT_OTHER): Payer: Medicare Other | Admitting: Oncology

## 2015-08-08 VITALS — BP 105/68 | HR 102 | Temp 97.7°F | Resp 20 | Ht 65.0 in | Wt 135.5 lb

## 2015-08-08 DIAGNOSIS — Z85820 Personal history of malignant melanoma of skin: Secondary | ICD-10-CM

## 2015-08-08 DIAGNOSIS — Z85038 Personal history of other malignant neoplasm of large intestine: Secondary | ICD-10-CM | POA: Diagnosis not present

## 2015-08-08 DIAGNOSIS — Z85118 Personal history of other malignant neoplasm of bronchus and lung: Secondary | ICD-10-CM

## 2015-08-08 DIAGNOSIS — C3491 Malignant neoplasm of unspecified part of right bronchus or lung: Secondary | ICD-10-CM

## 2015-08-08 NOTE — Telephone Encounter (Signed)
Appointments made and avs pritned for patient °

## 2015-08-08 NOTE — Progress Notes (Signed)
[name]  [phone]) [phone] [phone]) [phone]     ID: [name] DOB: 11-01-1965  MR#: [id number]  ZDG#:[id number]  Patient Care Team: [name], DO as PCP - General Deneise Lever, MD (Pulmonary Disease) PCP: Ronnie Doss, DO GYN: SU:  OTHER MD: Griselda Miner M.D., Rob Hickman M.D., Georganna Skeans MD, Harriett Sine MD  CHIEF COMPLAINT: malignant melanoma, colon carcinoma  CURRENT TREATMENT: Observation   HISTORY OF PRESENT ILLNESS:  From the prior summary:  I know [name] from her remote history of lung carcinoma, which has not recurred. More recently, her significant other [name] noticed a lesion in her right posterior calf. He keeps a picture Atlas of melanoma lesions on his bathroom wall, and told her he was "99% sure" the lesion there would be a melanoma. The patient brought it to Dr. Marjean Donna attention and a punch biopsy was obtained 09/06/2014. This showed ([id number]) malignant melanoma, with a maximum thickness of 0.33 mm, anatomic level III, no ulceration, with negative deep margins but clear peripheral margins. The mitotic index was low, less than one per square millimeter. There was no evidence of lymphovascular invasion. There was brisk tumor infiltrating lymphocytes. Tumor regression was noted across the full aspect of the punch biopsy.   This was read as a pathologic stage TIa, and the patient was referred to Dr. Sarajane Jews for wide excision, performed 09/13/2014. Dr. Everett Graff note indicates a 1 cm margin around the initial biopsy site. The final pathology ([id number]) showed a tumor thickness of 1.15, anatomic level III, with ulceration seen only in the area of prior biopsy, (and therefore read as negative). Peripheral as well as deep margins were free. Again the mitotic index was low there was no evidence of vascular invasion and tumor infiltrating lymphocytes were brisk. This was read as a pT2a lesion.   The  patient is referred for further evaluation and treatment.  INTERVAL HISTORY: [name] returns today for follow-up of her multiple cancers. In particular she was supposed to have had a repeat colonoscopy in November and she tells me she did receive a letter asking her to schedule that, but she did not recognize the doctor's name on the letter. She had her prior colonoscopy done by Dr. Olevia Perches.  REVIEW OF SYSTEMS: She has blurred vision at times but most of the time this is not a problem per she has chronic sinus issues and currently is "getting a cold" which she got from visiting her grandchildren over the Thanksgiving holidays. She has some reflux problems. She is short of breath sometimes at rest sometimes with minimal activity, other times she can do more. She follows with [facility] regarding her asthmatic bronchitis. She is following closely with dermatology and she has had additional lesions removed from her face in other areas of skin, but all benign. The scar on her right calf has healed well, she says. She has rare headaches. There are not more intense or persistent than before. She is anxious but not depressed. A detailed review of systems today was otherwise noncontributory  PAST MEDICAL HISTORY: Past Medical History  Diagnosis Date  . External hemorrhoids   . Allergic rhinitis   . 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 Chronic bronchitis   . History of lung cancer   . Panic attacks   . Anxiety disorder   . Dyspepsia   . Insomnia   . Goiter   . Migraine, unspecified, without mention of intractable migraine without  mention of status migrainosus   . Female stress incontinence   . Dizziness and giddiness   . Lung cancer ([place])   . EXTERNAL HEMORRHOIDS 09/27/2009  . 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 Skin cancer (melanoma) ([place])     right calf  . Hypercholesteremia     denies-last check normal labs  . Seizures ([place])     very long ago like 15 years ago or  so , questionable etilogy  . GERD (gastroesophageal reflux disease)     occ depending on diet   . COPD (chronic obstructive pulmonary disease) ([place])   . Cough     PAST SURGICAL HISTORY: Past Surgical History  Procedure Laterality Date  . Bronchoscopy  01/31/2005    nonspecific inflammation  . Spirometry  07/03/2002    min obstruction,mild restriction 01/31/2005  . Tvh  12/01/2000    hysterectomy  . Portacath removed  10/2007  . Biopsy of right neck mass      negative-thyroid biopsy   . Bronchoscopy  08/24/2010    nonspecific inflammation  . Lung removal, partial      for lung cancer--followed by Dr. Arlyce Dice and Dr. Annamaria Boots  . Melanoma removal      right calf   . Melanoma excision with sentinel lymph node biopsy Right 11/29/2014    Procedure: RIGHT INGUINAL SENTINEL LYMPH NODE BIOPSY;  Surgeon: Georganna Skeans, MD;  Location: [facility];  Service: General;  Laterality: Right;    FAMILY HISTORY Family History  Problem Relation Age of Onset  . Lung cancer Father   . Diabetes Maternal Grandfather   . Colon polyps Brother   . Colon cancer Neg Hx    the patient's father died in his mid 63s from lung cancer. The patient's mother is living, in her 55s. The patient has one brother, no sisters. There is no other history of cancer in the family to her knowledge  GYNECOLOGIC HISTORY:  No LMP recorded. Patient has had a hysterectomy. Menarche age 50, first live birth age 32. The patient is GX P1. She had a hysterectomy approximately 2001. She did not undergo salpingo-oophorectomy. She did not take hormone replacement  SOCIAL HISTORY:  She is disabled and lives by herself, with a cat. Her significant other, [name], works as a Chief Strategy Officer. Her daughter [name], 60 years old as of March 2016, lives in [place] and in addition to working has 4 children. Brandy's husband works for an Tree surgeon, putting up signs    ADVANCED DIRECTIVES: Not in place. The patient intends to name her mother [name] as healthcare power of attorney. She can be reached at [phone]. At the 11/07/2014 visit patient received the appropriate documents 2 complete and notarize at her discretion   HEALTH MAINTENANCE: Social History  Substance Use Topics  . Smoking status: Former Smoker    Types: Cigarettes    Quit date: 10/26/2001  . Smokeless tobacco: Never Used     Comment: Significant passive exposure from boy friend  . Alcohol Use: No     Comment: history of alcohol abuse 20-30    Mammography: March 2016  Colonoscopy: Due  PAP: Status post hysterectomy  Bone density:  Lipid panel:  No Known Allergies  Current Outpatient Prescriptions  Medication Sig Dispense Refill  . acetaminophen (TYLENOL) 650 MG CR tablet Take 650 mg by mouth every 8 (eight) hours as needed for pain.    Marland Kitchen albuterol (PROVENTIL) (2.5 MG/3ML) 0.083% nebulizer solution Take 3 mLs (2.5 mg total) by nebulization every 6 (  six) hours as needed for wheezing or shortness of breath. 75 vial prn  . Albuterol Sulfate (PROAIR RESPICLICK) 397 (90 BASE) MCG/ACT AEPB Inhale 2 puffs into the lungs every 6 (six) hours as needed. 1 each 11  . ALPRAZolam (XANAX) 0.5 MG tablet Take 1 tablet (0.5 mg total) by mouth 3 (three) times daily as needed for anxiety. 90 tablet 5  . azithromycin (ZITHROMAX) 250 MG tablet 2 today then one daily 6 each 0  . Fluticasone Furoate-Vilanterol (BREO ELLIPTA) 100-25 MCG/INH AEPB Inhale 1 puff into the lungs daily. Rinse after each use 1 each prn  . polyethylene glycol powder (GLYCOLAX/MIRALAX) powder Take 1 capful mixed in 8 ounces of liquid 1 to 2 times daily. 3350 g 11  . topiramate (TOPAMAX) 50 MG tablet TAKE 1 1/2 TABLET BY MOUTH TWICE DAILY. 270 tablet 3   No current facility-administered medications for this visit.    OBJECTIVE: Middle-aged white woman who appears stated age 72 Vitals:   08/08/15 1130  BP: 105/68  Pulse: 102  Temp: 97.7 F (36.5 C)  Resp: 20     Body mass index is 22.55  kg/(m^2).    ECOG FS:1 - Symptomatic but completely ambulatory  Sclerae unicteric, pupils round and equal Oropharynx clear and moist-- no thrush or other lesions No cervical or supraclavicular adenopathy Lungs no rales or rhonchi, fair excursion bilaterally  Heart regular rate and rhythm Abd soft, nontender, positive bowel sounds MSK no focal spinal tenderness, no upper extremity lymphedema Neuro: nonfocal, well oriented, appropriate affect Breasts: deferred Skin: The scar from the melanoma excision in the right calf has healed very nicely, with no evidence of recurrence.   LAB RESULTS:  CMP     Component Value Date/Time   NA 140 11/25/2014 0901   NA 142 11/07/2014 1045   NA 148* 07/09/2011 1413   K 3.6 11/25/2014 0901   K 3.6 11/07/2014 1045   K 3.5 07/09/2011 1413   CL 109 11/25/2014 0901   CL 111* 07/09/2012 0958   CL 100 07/09/2011 1413   CO2 26 11/25/2014 0901   CO2 24 11/07/2014 1045   CO2 26 07/09/2011 1413   GLUCOSE 84 11/25/2014 0901   GLUCOSE 75 11/07/2014 1045   GLUCOSE 80 07/09/2012 0958   GLUCOSE 96 07/09/2011 1413   BUN 7 11/25/2014 0901   BUN 8.7 11/07/2014 1045   BUN 11 07/09/2011 1413   CREATININE 0.66 11/25/2014 0901   CREATININE 0.8 11/07/2014 1045   CREATININE 0.77 05/13/2013 1052   CALCIUM 8.9 11/25/2014 0901   CALCIUM 8.9 11/07/2014 1045   CALCIUM 8.9 07/09/2011 1413   PROT 7.5 11/07/2014 1045   PROT 6.7 05/13/2013 1052   PROT 7.9 07/09/2011 1413   ALBUMIN 3.8 11/07/2014 1045   ALBUMIN 3.7 05/13/2013 1052   ALBUMIN 3.6 07/09/2011 1413   AST 22 11/07/2014 1045   AST 17 05/13/2013 1052   AST 17 07/09/2011 1413   ALT 17 11/07/2014 1045   ALT 11 05/13/2013 1052   ALT 16 07/09/2011 1413   ALKPHOS 72 11/07/2014 1045   ALKPHOS 66 05/13/2013 1052   ALKPHOS 94* 07/09/2011 1413   BILITOT 0.45 11/07/2014 1045   BILITOT 0.6 05/13/2013 1052   BILITOT 1.00 07/09/2011 1413   GFRNONAA >90 11/25/2014 0901   GFRAA >90 11/25/2014 0901    INo  results found for: SPEP, UPEP  Lab Results  Component Value Date   WBC 6.9 11/25/2014   NEUTROABS 5.8 11/07/2014   HGB 13.5 11/25/2014  HCT 38.0 11/25/2014   MCV 85.2 11/25/2014   PLT 194 11/25/2014      Chemistry      Component Value Date/Time   NA 140 11/25/2014 0901   NA 142 11/07/2014 1045   NA 148* 07/09/2011 1413   K 3.6 11/25/2014 0901   K 3.6 11/07/2014 1045   K 3.5 07/09/2011 1413   CL 109 11/25/2014 0901   CL 111* 07/09/2012 0958   CL 100 07/09/2011 1413   CO2 26 11/25/2014 0901   CO2 24 11/07/2014 1045   CO2 26 07/09/2011 1413   BUN 7 11/25/2014 0901   BUN 8.7 11/07/2014 1045   BUN 11 07/09/2011 1413   CREATININE 0.66 11/25/2014 0901   CREATININE 0.8 11/07/2014 1045   CREATININE 0.77 05/13/2013 1052      Component Value Date/Time   CALCIUM 8.9 11/25/2014 0901   CALCIUM 8.9 11/07/2014 1045   CALCIUM 8.9 07/09/2011 1413   ALKPHOS 72 11/07/2014 1045   ALKPHOS 66 05/13/2013 1052   ALKPHOS 94* 07/09/2011 1413   AST 22 11/07/2014 1045   AST 17 05/13/2013 1052   AST 17 07/09/2011 1413   ALT 17 11/07/2014 1045   ALT 11 05/13/2013 1052   ALT 16 07/09/2011 1413   BILITOT 0.45 11/07/2014 1045   BILITOT 0.6 05/13/2013 1052   BILITOT 1.00 07/09/2011 1413       Lab Results  Component Value Date   LABCA2 26 07/21/2007    No components found for: [id number]  No results for input(s): INR in the last 168 hours.  Urinalysis    Component Value Date/Time   LABSPEC 1.005 07/16/2012 1207   PHURINE 6.0 07/16/2012 1207   HGBUR Negative 07/16/2012 1207   BILIRUBINUR Negative 07/16/2012 1207   KETONESUR Negative 07/16/2012 1207   PROTEINUR Negative 07/16/2012 1207   NITRITE Negative 07/16/2012 1207   LEUKOCYTESUR Trace 07/16/2012 1207    STUDIES: Ct Chest W Contrast  08/01/2015  CLINICAL DATA:  Remote history of non-small cell lung cancer diagnosed in 2003 status post chemotherapy and radiation therapy. Right lower extremity melanoma resected in March  2016. Colon adenocarcinoma within a polyp diagnosed in 2016. EXAM: CT CHEST WITH CONTRAST TECHNIQUE: Multidetector CT imaging of the chest was performed during intravenous contrast administration. CONTRAST:  16m OMNIPAQUE IOHEXOL 300 MG/ML  SOLN COMPARISON:  07/09/2012 chest CT.  08/19/2014 chest radiograph. FINDINGS: Mediastinum/Nodes: Normal heart size. Stable mild pericardial fluid/thickening. Great vessels are normal in course and caliber. No central pulmonary emboli. Normal visualized thyroid. Normal esophagus. No pathologically enlarged axillary, mediastinal or hilar lymph nodes. Lungs/Pleura: No pneumothorax. No pleural effusion. The patient is status post right upper lobectomy. There is stable sharply marginated consolidation with air bronchograms in the central right middle and right lower lobes, in keeping with radiation fibrosis. There is mild-to-moderate centrilobular emphysema. There are stable mild reticulonodular opacities at both lung apices, most in keeping with chronic pleural-parenchymal scarring. No acute consolidative airspace disease, new significant pulmonary nodules or lung masses. The extensive tiny cavitary and non cavitary pulmonary nodules seen throughout both lungs on multiple prior chest CT studies have significantly decreased, with scattered tiny centrilobular nodules and thin-walled cysts throughout both lungs, largest 4 mm in the left upper lobe (series 5/image 16). Upper abdomen: Unremarkable. Musculoskeletal: No aggressive appearing focal osseous lesions. Mild degenerative changes in the thoracic spine. IMPRESSION: 1. Status post right upper lobectomy. 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 No evidence of local tumor recurrence. 2. No evidence of  metastatic disease in the chest. 3. Significant interval decrease in the extensive tiny cavitary and non cavitary pulmonary nodules throughout both lungs, in keeping with continued resolution of Langerhans cell  histiocytosis. 4. Mild-to-moderate centrilobular emphysema. Electronically Signed   By: Ilona Sorrel M.D.   On: 08/01/2015 14:49    ASSESSMENT: 49 y.o. [place] woman  A: LUNG CANCER  (1) status post right upper lobe wedge resection of a T1 N2, stage IIIA non-small cell lung cancer (adenocarcinoma) measuring 1.8 cm, poorly differentiated, with negative margins, but with 4 positive lymph nodes, 3 N1 and 1 N2;   (a) on [place] 2004 to 2009  (b) no evidence of disease recurrence on most recent chest x-ray 08/19/2014, and likely cured  (2) ashthmatic bronchitis/ COPD  (3) tobacco abuse: The patient quit smoking 2001  B: MALIGNANT MELANOMA  (4) s/p punch biopsy Right posterior calf for malignant melanoma 0.33 mm thickness, level III, w/o ulceration and with a low mitotic index, free deep but positive lateral margins  (5) status post wide excision 09/13/2014 showing a maximum tumor thickness of 1.15 mm, and anatomic level III, with ulceration noted only at the site of prior biopsy, with clear margins( 1 cm per surgical note) with a low mitotic index and brisk infiltration by tumor infiltrating lymphocytes: pT2a NX MX = stage IB  (6) right inguinal sentinel lymph node biopsy 11/29/2014 showed no evidence of melanoma  C: COLON CANCER (7) colonoscopy 11/29/2014 shows adenocarcinoma in  ascending colon polyp; repeat colonoscopy 12/30/2014 removed a tubular adenoma in that area which was negative for malignancy. The area was marked for possible definitive surgery  (a) Repeat colonoscopy with polypectomy 12/30/2014 showed tubular adenomas.   PLAN: [name] did receive a letter from [facility] recommending that she get her colonoscopy done, but it didn't have Dr. Nichola Sizer name on it so she didn't call. We discussed the fact that Dr. Olevia Perches is in process of retirement and she needs to call him to get that scheduled.  Otherwise she is pretty much up-to-date on her health maintenance with mammography  in March 2016, and continuing visits with Dr. Elvera Lennox for her active skin and history of melanoma. She follows up with Dr. Annamaria Boots regarding her COPD and asthma problems.  From our point of view it is very favorable that she is now 12 years out from her diagnosis of non-small cell lung cancer, with no evidence of disease recurrence.  She is going to return to see [place] in July. We will do lab work and a chest x-ray the same day. She knows to call for any problems that may develop before then   Jacky Kindle, MD   08/08/2015 11:37 AM Medical Oncology and Hematology [facility] [place], [place] [address] Tel. [phone]    Fax. [phone]

## 2015-10-10 ENCOUNTER — Other Ambulatory Visit: Payer: Self-pay

## 2015-10-10 DIAGNOSIS — Z1231 Encounter for screening mammogram for malignant neoplasm of breast: Secondary | ICD-10-CM

## 2015-10-31 ENCOUNTER — Encounter: Payer: Self-pay | Admitting: Gastroenterology

## 2015-11-15 ENCOUNTER — Ambulatory Visit (AMBULATORY_SURGERY_CENTER): Payer: Self-pay | Admitting: *Deleted

## 2015-11-15 VITALS — Ht 65.0 in | Wt 139.0 lb

## 2015-11-15 DIAGNOSIS — Z86010 Personal history of colonic polyps: Secondary | ICD-10-CM

## 2015-11-15 MED ORDER — MOVIPREP 100 G PO SOLR: 1.0000 | Freq: Once | ORAL | Status: DC

## 2015-11-15 NOTE — Progress Notes (Signed)
No egg or soy allergy known to patient  No issues with past sedation with any surgeries  or procedures, no intubation problems  No diet pills per patient No home 02 use per patient  No blood thinners per patient  Pt denies issues with constipation  Pt requesting Movi prep as her bowel prep. She used Suprep and hard to take so requesting movi Pt states she is having rectal bleeding frequently. She states every 2 days or so she has rectal bleeding that is when she stools or even when she doesn't have a stool she has bleeding. She questions if the clip dr Olevia Perches placed 12-30-2014 is still there causing bleeding.  No abd pain but has severe gas, no fever. She does have a hx of internal and external hems

## 2015-11-17 ENCOUNTER — Ambulatory Visit
Admission: RE | Admit: 2015-11-17 | Discharge: 2015-11-17 | Disposition: A | Discharge status: Home / Self Care | Payer: Medicare Other | Source: Ambulatory Visit

## 2015-11-17 DIAGNOSIS — Z1231 Encounter for screening mammogram for malignant neoplasm of breast: Secondary | ICD-10-CM

## 2015-11-29 ENCOUNTER — Ambulatory Visit (AMBULATORY_SURGERY_CENTER): Payer: Medicare Other | Admitting: Gastroenterology

## 2015-11-29 ENCOUNTER — Telehealth: Payer: Self-pay | Admitting: *Deleted

## 2015-11-29 ENCOUNTER — Encounter: Payer: Self-pay | Admitting: Gastroenterology

## 2015-11-29 VITALS — BP 117/72 | HR 83 | Temp 97.0°F | Resp 19 | Ht 65.0 in | Wt 139.0 lb

## 2015-11-29 DIAGNOSIS — Z86010 Personal history of colonic polyps: Secondary | ICD-10-CM

## 2015-11-29 DIAGNOSIS — D123 Benign neoplasm of transverse colon: Secondary | ICD-10-CM

## 2015-11-29 DIAGNOSIS — K635 Polyp of colon: Secondary | ICD-10-CM | POA: Diagnosis not present

## 2015-11-29 DIAGNOSIS — D122 Benign neoplasm of ascending colon: Secondary | ICD-10-CM

## 2015-11-29 DIAGNOSIS — D125 Benign neoplasm of sigmoid colon: Secondary | ICD-10-CM | POA: Diagnosis not present

## 2015-11-29 MED ORDER — SODIUM CHLORIDE 0.9 % IV SOLN
500.0000 mL | INTRAVENOUS | Status: DC
Start: 2015-11-29 — End: 2015-11-29

## 2015-11-29 NOTE — Progress Notes (Signed)
No problems noted in the recovery room. maw 

## 2015-11-29 NOTE — Telephone Encounter (Signed)
Left a message for patient to call back to discuss scheduling for hemorrhoid banding.

## 2015-11-29 NOTE — Op Note (Signed)
[place] Patient Name: [name] Procedure Date: 11/29/2015 11:35 AM MRN: [id number] Endoscopist: Remo Lipps P. Havery Moros , MD Age: 50 Referring MD:  Date of Birth: [birth year] Gender: Female Procedure:                Colonoscopy Indications:              High risk colon cancer surveillance: Personal                            history of non-advanced adenoma, High risk colon                            cancer surveillance: Personal history of cancerous                            polyp removed one year ago with multiple other                            adenomas removed within the past year. Also history                            of melanoma and lung cancer. Medicines:                Monitored Anesthesia Care Procedure:                Pre-Anesthesia Assessment:                           - Prior to the procedure, a History and Physical                            was performed, and patient medications and                            allergies were reviewed. The patient's tolerance of                            previous anesthesia was also reviewed. The risks                            and benefits of the procedure and the sedation                            options and risks were discussed with the patient.                            All questions were answered, and informed consent                            was obtained. Prior Anticoagulants: The patient has                            taken no previous anticoagulant or antiplatelet  agents. ASA Grade Assessment: III - A patient with                            severe systemic disease. After reviewing the risks                            and benefits, the patient was deemed in                            satisfactory condition to undergo the procedure.                           After obtaining informed consent, the colonoscope                            was passed under direct vision. Throughout the                             procedure, the patient's blood pressure, pulse, and                            oxygen saturations were monitored continuously. The                            Model PCF-H190L [id number]) scope was introduced                            through the anus and advanced to the the cecum,                            identified by appendiceal orifice and ileocecal                            valve. The colonoscopy was performed without                            difficulty. The patient tolerated the procedure                            well. The quality of the bowel preparation was                            adequate. The ileocecal valve, appendiceal orifice,                            and rectum were photographed. Scope In: 11:45:31 AM Scope Out: 12:07:58 PM Scope Withdrawal Time: 0 hours 19 minutes 55 seconds  Total Procedure Duration: 0 hours 22 minutes 27 seconds  Findings:      A 4 mm polyp was found in the ascending colon. The polyp was sessile.       The polyp was removed with a cold snare. Resection and retrieval were       complete.      A diminutive polyp was found in the ascending colon. The polyp  was       sessile. The polyp was removed with a cold biopsy forceps. Resection and       retrieval were complete.      A post polypectomy scar was found in the ascending colon. There was a       few mm focus of possible residual polyp at the site. This was removed       with a cold forceps for histology.      A 6 mm polyp was found in the transverse colon. The polyp was       semi-pedunculated. The polyp was removed with a hot snare. Resection and       retrieval were complete.      A 4 mm polyp was found in the transverse colon. The polyp was sessile.       The polyp was removed with a cold snare. Resection and retrieval were       complete.      A 4 mm polyp was found in the sigmoid colon. The polyp was sessile. The       polyp was removed with a cold snare.  Resection and retrieval were       complete.      A single small angiodysplastic lesion without bleeding was found in the       sigmoid colon.      Non-bleeding internal hemorrhoids were found during retroflexion. The       hemorrhoids were small.      The exam was otherwise normal throughout the examined colon. Complications:            No immediate complications. Estimated blood loss:                            Minimal. Estimated Blood Loss:     Estimated blood loss was minimal. Impression:               - One 4 mm polyp in the ascending colon, removed                            with a cold snare. Resected and retrieved.                           - One diminutive polyp in the ascending colon,                            removed with a cold biopsy forceps. Resected and                            retrieved.                           - Post-polypectomy scar in the ascending colon.                            Biopsied.                           - One 6 mm polyp in the transverse colon, removed  with a hot snare. Resected and retrieved.                           - One 4 mm polyp in the transverse colon, removed                            with a cold snare. Resected and retrieved.                           - One 4 mm polyp in the sigmoid colon, removed with                            a cold snare. Resected and retrieved.                           - A single non-bleeding colonic angiodysplastic                            lesion.                           - Non-bleeding internal hemorrhoids. Recommendation:           - Patient has a contact number available for                            emergencies. The signs and symptoms of potential                            delayed complications were discussed with the                            patient. Return to normal activities tomorrow.                            Written discharge instructions were provided to the                             patient.                           - Resume previous diet.                           - Continue present medications.                           - No aspirin, ibuprofen, naproxen, or other                            non-steroidal anti-inflammatory drugs for 2 weeks                            after polyp removal.                           -  Await pathology results.                           - Repeat colonoscopy is recommended for                            surveillance. Consider repeat colonoscopy in one                            year given the number of polyps removed over the                            past year, with one polyp having adenocarcinoma                           - Consider genetic testing if not already done                            given the patient's history of multiple malignancies Procedure Code(s):        --- Professional ---                           [phone], Colonoscopy, flexible; with removal of                            tumor(s), polyp(s), or other lesion(s) by snare                            technique                           45380, 59, Colonoscopy, flexible; with biopsy,                            single or multiple CPT copyright 2016 American Medical Association. All rights reserved. Remo Lipps P. Avalyn Molino, MD 11/29/2015 12:16:49 PM This report has been signed electronically. Number of Addenda: 0 Referring MD:      Koleen Distance. Lajuana Ripple

## 2015-11-29 NOTE — Progress Notes (Signed)
TO PACU  Pt awake and alert  Report to RN

## 2015-11-29 NOTE — Progress Notes (Signed)
Called to room to assist during endoscopic procedure.  Patient ID and intended procedure confirmed with present staff. Received instructions for my participation in the procedure from the performing physician.  

## 2015-11-29 NOTE — Telephone Encounter (Signed)
Spoke with patient and scheduled first hemorrhoid banding on 12/20/15 at 3:30 PM.

## 2015-11-29 NOTE — Patient Instructions (Signed)
FOLLOW DISCHARGE INSTRUCTIONS (BLUE AND GREEN SHEETS).YOU HAD AN ENDOSCOPIC PROCEDURE TODAY AT [facility]:   Refer to the procedure report that was given to you for any specific questions about what was found during the examination.  If the procedure report does not answer your questions, please call your gastroenterologist to clarify.  If you requested that your care partner not be given the details of your procedure findings, then the procedure report has been included in a sealed envelope for you to review at your convenience later.  YOU SHOULD EXPECT: Some feelings of bloating in the abdomen. Passage of more gas than usual.  Walking can help get rid of the air that was put into your GI tract during the procedure and reduce the bloating. If you had a lower endoscopy (such as a colonoscopy or flexible sigmoidoscopy) you may notice spotting of blood in your stool or on the toilet paper. If you underwent a bowel prep for your procedure, you may not have a normal bowel movement for a few days.  Please Note:  You might notice some irritation and congestion in your nose or some drainage.  This is from the oxygen used during your procedure.  There is no need for concern and it should clear up in a day or so.  SYMPTOMS TO REPORT IMMEDIATELY:   Following lower endoscopy (colonoscopy or flexible sigmoidoscopy):  Excessive amounts of blood in the stool  Significant tenderness or worsening of abdominal pains  Swelling of the abdomen that is new, acute  Fever of 100F or higher   Following upper endoscopy (EGD)  Vomiting of blood or coffee ground material  New chest pain or pain under the shoulder blades  Painful or persistently difficult swallowing  New shortness of breath  Fever of 100F or higher  Black, tarry-looking stools  For urgent or emergent issues, a gastroenterologist can be reached at any hour by calling [phone].   DIET: Your first meal following the procedure  should be a small meal and then it is ok to progress to your normal diet. Heavy or fried foods are harder to digest and may make you feel nauseous or bloated.  Likewise, meals heavy in dairy and vegetables can increase bloating.  Drink plenty of fluids but you should avoid alcoholic beverages for 24 hours.  ACTIVITY:  You should plan to take it easy for the rest of today and you should NOT DRIVE or use heavy machinery until tomorrow (because of the sedation medicines used during the test).    FOLLOW UP: Our staff will call the number listed on your records the next business day following your procedure to check on you and address any questions or concerns that you may have regarding the information given to you following your procedure. If we do not reach you, we will leave a message.  However, if you are feeling well and you are not experiencing any problems, there is no need to return our call.  We will assume that you have returned to your regular daily activities without incident.  If any biopsies were taken you will be contacted by phone or by letter within the next 1-3 weeks.  Please call us at [phone] if you have not heard about the biopsies in 3 weeks.    SIGNATURES/CONFIDENTIALITY: You and/or your care partner have signed paperwork which will be entered into your electronic medical record.  These signatures attest to the fact that that the information above on your  After Visit Summary has been reviewed and is understood.  Full responsibility of the confidentiality of this discharge information lies with you and/or your care-partner.  No aspirin, ibuprofen.naproxen, or other non steroidal anti inflammatoey medications for 2 weeks.    Polyp information given.  Hemorrhoid banding information given.  Dr. Ozella Rocks office will schedule you an appointment for this.

## 2015-11-30 ENCOUNTER — Telehealth: Payer: Self-pay

## 2015-11-30 NOTE — Telephone Encounter (Signed)
  Follow up Call-  Call back number 11/29/2015 12/30/2014 11/24/2014  Post procedure Call Back phone  # [phone]  Permission to leave phone message Yes Yes Yes     Patient questions:  Do you have a fever, pain , or abdominal swelling? No. Pain Score  0 *  Have you tolerated food without any problems? Yes.    Have you been able to return to your normal activities? Yes.    Do you have any questions about your discharge instructions: Diet   No. Medications  No. Follow up visit  No.  Do you have questions or concerns about your Care? No.  Actions: * If pain score is 4 or above: No action needed, pain <4.

## 2015-12-14 ENCOUNTER — Other Ambulatory Visit: Payer: Self-pay | Admitting: Family Medicine

## 2015-12-14 DIAGNOSIS — G43909 Migraine, unspecified, not intractable, without status migrainosus: Secondary | ICD-10-CM

## 2015-12-14 DIAGNOSIS — F411 Generalized anxiety disorder: Secondary | ICD-10-CM

## 2015-12-14 MED ORDER — ALPRAZOLAM 0.5 MG PO TABS: 0.5000 mg | ORAL_TABLET | Freq: Three times a day (TID) | ORAL | Status: DC | PRN

## 2015-12-14 MED ORDER — TOPIRAMATE 50 MG PO TABS: ORAL_TABLET | ORAL | Status: DC

## 2015-12-14 NOTE — Telephone Encounter (Signed)
Patient asks refill for 6 days for Xanax 0.5 mg and Topamax 50 mg. Please, follow up.

## 2015-12-20 ENCOUNTER — Encounter: Payer: Self-pay | Admitting: Family Medicine

## 2015-12-20 ENCOUNTER — Ambulatory Visit (INDEPENDENT_AMBULATORY_CARE_PROVIDER_SITE_OTHER): Payer: Medicare Other | Admitting: Family Medicine

## 2015-12-20 ENCOUNTER — Ambulatory Visit (INDEPENDENT_AMBULATORY_CARE_PROVIDER_SITE_OTHER): Payer: Medicare Other | Admitting: Gastroenterology

## 2015-12-20 ENCOUNTER — Encounter: Payer: Self-pay | Admitting: Gastroenterology

## 2015-12-20 VITALS — BP 108/64 | HR 100 | Ht 64.5 in | Wt 139.2 lb

## 2015-12-20 VITALS — BP 115/78 | HR 108 | Temp 98.5°F | Wt 139.2 lb

## 2015-12-20 DIAGNOSIS — K640 First degree hemorrhoids: Secondary | ICD-10-CM | POA: Diagnosis not present

## 2015-12-20 DIAGNOSIS — K5900 Constipation, unspecified: Secondary | ICD-10-CM | POA: Diagnosis not present

## 2015-12-20 DIAGNOSIS — F410 Panic disorder [episodic paroxysmal anxiety] without agoraphobia: Secondary | ICD-10-CM

## 2015-12-20 DIAGNOSIS — J0100 Acute maxillary sinusitis, unspecified: Secondary | ICD-10-CM

## 2015-12-20 DIAGNOSIS — F411 Generalized anxiety disorder: Secondary | ICD-10-CM | POA: Diagnosis not present

## 2015-12-20 DIAGNOSIS — G43909 Migraine, unspecified, not intractable, without status migrainosus: Secondary | ICD-10-CM

## 2015-12-20 MED ORDER — ALPRAZOLAM 0.5 MG PO TABS: 0.5000 mg | ORAL_TABLET | Freq: Three times a day (TID) | ORAL | Status: DC | PRN

## 2015-12-20 MED ORDER — ALPRAZOLAM 1 MG PO TABS: 1.0000 mg | ORAL_TABLET | Freq: Two times a day (BID) | ORAL | Status: DC | PRN

## 2015-12-20 MED ORDER — TOPIRAMATE 50 MG PO TABS: ORAL_TABLET | ORAL | Status: DC

## 2015-12-20 MED ORDER — AZITHROMYCIN 250 MG PO TABS: ORAL_TABLET | ORAL | Status: DC

## 2015-12-20 MED ORDER — POLYETHYLENE GLYCOL 3350 17 GM/SCOOP PO POWD: 17.0000 g | Freq: Two times a day (BID) | ORAL | Status: DC | PRN

## 2015-12-20 NOTE — Progress Notes (Signed)
PROCEDURE NOTE: The patient presents with symptomatic grade I  hemorrhoids, requesting rubber band ligation of his/her hemorrhoidal disease.  All risks, benefits and alternative forms of therapy were described and informed consent was obtained.   The anorectum was pre-medicated with 0.125% nitroglycerine The decision was made to band the LL internal hemorrhoid, and the [place] was used to perform band ligation without complication.  Digital anorectal examination was then performed to assure proper positioning of the band, and to adjust the banded tissue as required.  The patient was discharged home without pain or other issues.  Dietary and behavioral recommendations were given and along with follow-up instructions.     The following adjunctive treatments were recommended: Daily fiber supplemenet - benefiber  The patient will return in 2-3 weeks for  follow-up and possible additional banding as required. No complications were encountered and the patient tolerated the procedure well.  [place] Cellar, MD [facility] Pager [phone]

## 2015-12-20 NOTE — Patient Instructions (Signed)
  HEMORRHOID BANDING PROCEDURE    FOLLOW-UP CARE   1. The procedure you have had should have been relatively painless since the banding of the area involved does not have nerve endings and there is no pain sensation.  The rubber band cuts off the blood supply to the hemorrhoid and the band may fall off as soon as 48 hours after the banding (the band may occasionally be seen in the toilet bowl following a bowel movement). You may notice a temporary feeling of fullness in the rectum which should respond adequately to plain Tylenol or Motrin.  2. Following the banding, avoid strenuous exercise that evening and resume full activity the next day.  A sitz bath (soaking in a warm tub) or bidet is soothing, and can be useful for cleansing the area after bowel movements.     3. To avoid constipation, take two tablespoons of natural wheat bran, natural oat bran, flax, Benefiber or any over the counter fiber supplement and increase your water intake to 7-8 glasses daily.    4. Unless you have been prescribed anorectal medication, do not put anything inside your rectum for two weeks: No suppositories, enemas, fingers, etc.  5. Occasionally, you may have more bleeding than usual after the banding procedure.  This is often from the untreated hemorrhoids rather than the treated one.  Don't be concerned if there is a tablespoon or so of blood.  If there is more blood than this, lie flat with your bottom higher than your head and apply an ice pack to the area. If the bleeding does not stop within a half an hour or if you feel faint, call our office at [phone] or go to the emergency room.  6. Problems are not common; however, if there is a substantial amount of bleeding, severe pain, chills, fever or difficulty passing urine (very rare) or other problems, you should call us at [phone] or report to the nearest emergency room.  7. Do not stay seated continuously for more than 2-3 hours for a day or  two after the procedure.  Tighten your buttock muscles 10-15 times every two hours and take 10-15 deep breaths every 1-2 hours.  Do not spend more than a few minutes on the toilet if you cannot empty your bowel; instead re-visit the toilet at a later time.    Today we are giving you a handout on benefiber to read and follow.   We will see you at your next banding appointment on May 26th at 4:00pm.   I appreciate the opportunity to care for you.

## 2015-12-20 NOTE — Patient Instructions (Signed)
Follow up in 6 months for anxiety.

## 2015-12-20 NOTE — Progress Notes (Signed)
Subjective: CC: 6 month follow up HPI: [name] is a 50 y.o. female presenting to clinic today for follow up. Concerns today include:  1. Hemorrhoids Patient reports that she is scheduled to have hemorrhoid banding this afternoon.  She notes bleeding per rectum with each bowel movement.  Notes occ itching/ pain from her external hemorrhoids.  Continues to have constipation and asks that she get a refill on her Miralax powder, as this helped a lot in the past.  2. Anxiety Patient reports that anxiety is moderately controlled with Xanax.  She is wondering if she can take a higher dose twice daily.  She reports that the Xanax works but seems to wear off before her next dose is due.  Denies HI/SI.  Endorses increased anxiety secondary to multiple cancer diagnoses.  3. Migraine Migraines well controlled with Topamax.  No concerns at this time.  No neurologic issues.  Social History Reviewed: non smoker but exposed to second hand smoke. FamHx and MedHx reviewed.  Please see EMR.  ROS: Per HPI  Objective: Office vital signs reviewed. BP 115/78 mmHg  Pulse 108  Temp(Src) 98.5 F (36.9 C) (Oral)  Wt 139 lb 3.2 oz (63.141 kg)  Physical Examination:  General: Awake, alert, well nourished, No acute distress, smells of tobacco, somewhat anxious appearing HEENT: Normal, + TTP to maxillary sinuses    Neck: No masses palpated. No lymphadenopathy    Ears: Tympanic membranes intact, normal light reflex, no erythema, no bulging    Eyes: PERRLA, EOMI    Nose: nasal turbinates moist, edematous, no purulence noted    Throat: moist mucus membranes, no erythema Cardio: regular rate and rhythm, S1S2 heard, no murmurs appreciated Pulm: clear to auscultation bilaterally, no wheezes, rhonchi or rales Extremities: warm, well perfused, No edema, cyanosis or clubbing; +2 pulses bilaterally, posterior LLE with well healing scar.  Assessment/ Plan: 50 y.o. female   1. PANIC ATTACKS.   Discussed at length that q8 hour dosing likely more beneficial that increasing dose and going BID.  She would like to proceed however with BID dosing of higher strenght. - ALPRAZolam (XANAX) 1 MG tablet; Take 1 tablet (1 mg total) by mouth 2 (two) times daily as needed for anxiety.  Dispense: 60 tablet; Refill: 5 - Discussed that she could break these tablets in half and resume her previous regimen if '1mg'$  too strong.  She voices good understanding - Discussed consideration of Klonopin instead as this is prescribed BID, she seems hesitant to change.  Will revisit this again in the future - Did not tolerate SSRI, though we could discuss revisiting this again in the future.  2. Generalized anxiety disorder - as above - ALPRAZolam (XANAX) 1 MG tablet; Take 1 tablet (1 mg total) by mouth 2 (two) times daily as needed for anxiety.  Dispense: 60 tablet; Refill: 5  3. Migraine without status migrainosus, not intractable, unspecified migraine type - topiramate (TOPAMAX) 50 MG tablet; TAKE 1 1/2 TABLET BY MOUTH TWICE DAILY.  Dispense: 270 tablet; Refill: 4  4. Constipation, unspecified constipation type - polyethylene glycol powder (GLYCOLAX/MIRALAX) powder; Take 17 g by mouth 2 (two) times daily as needed.  Dispense: 3350 g; Refill: 1  5. Acute maxillary sinusitis, recurrence not specified.   Afebrile here.  +Maxillary sinus TTP.   - Instructions given to hold off on abx for next couple of days.  If continues to have symptoms, can start abx at that time. - azithromycin (ZITHROMAX) 250 MG tablet; Take  as directed  Dispense: 6 each; Refill: 0 - Return precautions reviewed. - Follow up in 6 months.  [name], DO PGY-2, [place]

## 2015-12-26 ENCOUNTER — Encounter: Payer: Self-pay | Admitting: Internal Medicine

## 2015-12-26 ENCOUNTER — Ambulatory Visit (INDEPENDENT_AMBULATORY_CARE_PROVIDER_SITE_OTHER): Payer: Medicare Other | Admitting: Internal Medicine

## 2015-12-26 VITALS — BP 100/60 | HR 119 | Temp 98.6°F | Ht 65.0 in | Wt 136.2 lb

## 2015-12-26 DIAGNOSIS — J4541 Moderate persistent asthma with (acute) exacerbation: Secondary | ICD-10-CM | POA: Diagnosis not present

## 2015-12-26 DIAGNOSIS — J209 Acute bronchitis, unspecified: Secondary | ICD-10-CM | POA: Diagnosis not present

## 2015-12-26 MED ORDER — PROMETHAZINE-CODEINE 6.25-10 MG/5ML PO SYRP: 5.0000 mL | ORAL_SOLUTION | Freq: Four times a day (QID) | ORAL | Status: DC | PRN

## 2015-12-26 MED ORDER — METHYLPREDNISOLONE ACETATE 80 MG/ML IJ SUSP
80.0000 mg | Freq: Once | INTRAMUSCULAR | Status: AC
  Administered 2015-12-26: 80 mg via INTRAMUSCULAR

## 2015-12-26 MED ORDER — DOXYCYCLINE HYCLATE 100 MG PO TABS: 100.0000 mg | ORAL_TABLET | Freq: Two times a day (BID) | ORAL | Status: DC

## 2015-12-26 NOTE — Patient Instructions (Signed)
Depo 80     Dx acute bronchitis  Script printed for doxycycline antibiotic to start if you don't get better  Script printed for cough syrup  Stay well- hydrated, lots of fluids, plenty of rest. Throat lozenges, otc cough syrups and comfort meds

## 2015-12-26 NOTE — Progress Notes (Signed)
Patient ID: [name], female    DOB: Jan 06, 1966, 50 y.o.   MRN: [id number]  HPI 50 yo former heavy smoker followed after RUL lobectomy for lung cancer, chronic bronchitis, allergic rhinitis, recent diffuse interstitial process. Dr Jana Hakim recently saw her for Oncology f/u and repeated CT 11/09/10 - images reviewed. There has been a decrease in the numerous cavitating nodular infiltrates since November comparison. This improved with no treatment. She has seen Dr Arlyce Dice today.  She has been sunning herself, lying outside and blames seasonal pollen for what she considers normal for season mild chest congestion with scant light yellow phlegm. Denies chest pain, fever, night sweats, nodes. Recent PPD skin test was negative. HIV serology was negative. She had chosen to defer bronchoscopy discussed at last visit, because she felt well despite CXR.  02/12/11-  Feels ok. Had a chest cold a couple of weeks ago- largely resolved. Otherwise has felt well. Boyfriend still smokes heavily around her-  makes her cough. Otherwise not noting routine cough, chest pain, palpitation, fever.  06/25/11-44 yo former heavy smoker followed after RUL lobectomy for lung cancer, chronic bronchitis, allergic rhinitis, recent diffuse interstitial process. Cooler air of the fall season and humid air in the shower both cause her chest to tighten. Dr. Arlyce Dice has now released her from his long-term followup. She is coughing and wheezing very little and feels stable. She is still exposed to her boyfriend's cigarette smoke. CXR-06/12/2011-stable post therapy appearance of the chest with nothing new. There is a right hilar/paratracheal mass with surgical clips unchanged from 2010, radiation changes. The widespread pulmonary interstitial opacities which developed over a year ago, are stable and consistent now with scarring.  12/24/11- 62 yo former heavy smoker followed after RUL lobectomy for lung cancer, chronic bronchitis, allergic  rhinitis, recent diffuse interstitial process. Blames pollen for recent tighter chest, mild wheeze, nasal congestion and drainage. Denies fever, pain or blood. Discussed medication refill needs. Educated the difference between Mucinex and antihistamines. Chest x-ray 06/12/2011-reviewed-stable.  06/23/12- 63 yo former heavy smoker followed after RUL lobectomy for lung cancer, chronic bronchitis, allergic rhinitis, recent diffuse interstitial process.  Had flu vaccine Pt c/o increased coughing while showering, pt thinks d/t heat and steam. Pt notices this with severe weather changes as well.  Patient needs printed Rx 30 day supply/refills for Foradil, Xopenex HFA and Neb and Asmanex. Boyfriend still smokes but they got rid of the wood stove. Xopenex has been a big help. Her oncologist will get a followup CT scan but there has been no recurrence of her lung cancer.  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  FOLLOWS FOR: Pt. reports there is no change with her coughing, pt. states she feels stuffy, due to weather change.  Pt. denies any chest tightness or wheezing. Sneezing some from the pollen. Otherwise okay. She has been released for followup by her oncologist after 5 years. medications reviewed. Very sensitive to albuterol-shaky. CT 07/09/12 IMPRESSION:  1. Stable surgical and radiation changes involving the right lung.  No findings for recurrent tumor or pulmonary metastatic disease.  2. 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 Recommend clinical correlation.  Original Report Authenticated By: Marijo Sanes, M.D.  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  FOLLOWS GMW:NUUVOZDGU is about the same however having slight increase in cough(gets this time of year) [name] better as well. Had flu vaccine. 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  FOLLOWS FOR: Pollen is causing her to have cough and congestion. Head congestion, sneeze, some cough-green. No fever. Likes Breo. CT 06/24/13 IMPRESSION:  Stable postsurgical changes and postradiation changes in right upper  lobe. Surgical clips in right hilum again noted. Again noted status  post right upper thoracotomy. No pulmonary edema. No definite  evidence of superimposed infiltrate or recurrent metastatic disease.  Electronically Signed  By: Lahoma Crocker M.D.  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  FOLLOW FOR:  Asthma w/ Bronchitis; sinus pressure; cough, would like cough medication to help her sleep at night Acute- 3 days- cough green, sinus drainage, ? 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 Pollen is making her cough some with slightly yellow sputum, postnasal drainage but she think she's had some low-grade fever, feels badly and thinks she has a cold. Has liked Breo inhaler. CXR 08/19/14 IMPRESSION: 1. No interval change. 2. Stable postsurgical change in  the right hemi thorax. 3. Stable interstitial lung disease. Electronically Signed  By: Suzy Bouchard M.D.  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 Pt states that she has recurring symptoms when the seasons change. Pt states that she uses her albuterol HFA once a day.  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 ago,cough-dry,midchest pain with cough,fever-chill/sweats not cked at home,not sleeping due to coughing,no pnd,headaches,nasal congestion-clear,sob-same,weakness,no appetite,wheezing.Finished Zpak 2 days ago. Acute illness. Significant cough just now beginning to bring up thick sputum, mostly clear.  Review of Systems-See HPI Constitutional:   No-   weight loss, night sweats, fevers, chills, fatigue, lassitude. HEENT:   No-  headaches, difficulty swallowing, tooth/dental problems, sore throat,      +mild sneezing,no- itching, ear ache, +nasal congestion, post nasal drip,  CV:  No-   chest pain, orthopnea, PND, swelling in lower extremities, anasarca, dizziness, palpitations Resp: No- acute  shortness of breath with exertion or at rest.             + productive cough,  little non-productive cough,  No- coughing up of blood.              + change in color of mucus.  +wheezing.   Skin: No-  rash or lesions. GI:  No-   heartburn, indigestion, abdominal pain, nausea, vomiting,  GU:   MS:  No-   joint pain or swelling.   Neuro-     nothing  unusual Psych:  No- change in mood or affect. No depression or anxiety.  No memory loss.   Objective:   Physical Exam General- Alert, Oriented, Affect-appropriate, Distress- + Acutely ill, wearing a mask Skin- rash-none, lesions- none, excoriation- none. Always has a somewhat sunburned look. Lymphadenopathy- none Head- atraumatic            Eyes- Gross vision intact, PERRLA, conjunctivae clear secretions            Ears- Hearing, canals-normal            Nose- stuffy +, no-Septal dev, mucus, polyps, erosion, perforation             Throat- Mallampati II , mucosa clear , drainage- none, tonsils- atrophic Neck- flexible , trachea midline, no stridor , thyroid nl, carotid no bruit Chest - symmetrical excursion , unlabored           Heart/CV- RRR , no murmur , no gallop  , no rub, nl s1 s2                           - JVD- none , edema- none, stasis changes- none, varices- none           Lung- rhonchi + mild bilateral,  Wheeze-+ bilateral, cough +, dullness-none, rub- none. Unlabored           Chest wall-  Abd-  Br/ Gen/ Rectal- Not done, not indicated Extrem- cyanosis- none, clubbing, none, atrophy- none, strength- nl Neuro- grossly intact to observation

## 2015-12-26 NOTE — Assessment & Plan Note (Signed)
Significant acute exacerbation consistent with a viral bronchitis. Doubt pneumonia at this time Plan-hydration, rest and comfort care. Cough syrup refilled. Doxycycline to use if gets sicker, purulent

## 2016-01-09 ENCOUNTER — Other Ambulatory Visit: Payer: Self-pay | Admitting: Nurse Practitioner

## 2016-01-26 ENCOUNTER — Ambulatory Visit (INDEPENDENT_AMBULATORY_CARE_PROVIDER_SITE_OTHER): Payer: Medicare Other | Admitting: Gastroenterology

## 2016-01-26 ENCOUNTER — Encounter: Payer: Self-pay | Admitting: Gastroenterology

## 2016-01-26 VITALS — BP 100/72 | HR 64 | Ht 64.5 in | Wt 138.0 lb

## 2016-01-26 DIAGNOSIS — K640 First degree hemorrhoids: Secondary | ICD-10-CM

## 2016-01-26 NOTE — Progress Notes (Signed)
PROCEDURE NOTE: The patient presents with symptomatic grade I  hemorrhoids, requesting rubber band ligation of his/her hemorrhoidal disease.  All risks, benefits and alternative forms of therapy were described and informed consent was obtained.   The anorectum was pre-medicated with 0.125% nitroglycerin The decision was made to band the RP internal hemorrhoid, and the [place] was used to perform band ligation without complication.  Digital anorectal examination was then performed to assure proper positioning of the band, and to adjust the banded tissue as required.  The patient was discharged home without pain or other issues.  Dietary and behavioral recommendations were given and along with follow-up instructions.     The following adjunctive treatments were recommended: Daily fiber supplement  The patient will return in 2-3 weeks for  follow-up and possible additional banding as required. No complications were encountered and the patient tolerated the procedure well.  [place] Cellar, MD [facility] Pager [phone]

## 2016-01-26 NOTE — Patient Instructions (Signed)
HEMORRHOID BANDING PROCEDURE    FOLLOW-UP CARE   1. The procedure you have had should have been relatively painless since the banding of the area involved does not have nerve endings and there is no pain sensation.  The rubber band cuts off the blood supply to the hemorrhoid and the band may fall off as soon as 48 hours after the banding (the band may occasionally be seen in the toilet bowl following a bowel movement). You may notice a temporary feeling of fullness in the rectum which should respond adequately to plain Tylenol or Motrin.  2. Following the banding, avoid strenuous exercise that evening and resume full activity the next day.  A sitz bath (soaking in a warm tub) or bidet is soothing, and can be useful for cleansing the area after bowel movements.     3. To avoid constipation, take two tablespoons of natural wheat bran, natural oat bran, flax, Benefiber or any over the counter fiber supplement and increase your water intake to 7-8 glasses daily.    4. Unless you have been prescribed anorectal medication, do not put anything inside your rectum for two weeks: No suppositories, enemas, fingers, etc.  5. Occasionally, you may have more bleeding than usual after the banding procedure.  This is often from the untreated hemorrhoids rather than the treated one.  Don't be concerned if there is a tablespoon or so of blood.  If there is more blood than this, lie flat with your bottom higher than your head and apply an ice pack to the area. If the bleeding does not stop within a half an hour or if you feel faint, call our office at [phone] or go to the emergency room.  6. Problems are not common; however, if there is a substantial amount of bleeding, severe pain, chills, fever or difficulty passing urine (very rare) or other problems, you should call us at [phone] or report to the nearest emergency room.  7. Do not stay seated continuously for more than 2-3 hours for a day or two  after the procedure.  Tighten your buttock muscles 10-15 times every two hours and take 10-15 deep breaths every 1-2 hours.  Do not spend more than a few minutes on the toilet if you cannot empty your bowel; instead re-visit the toilet at a later time.   Thank you for choosing [facility]  Dr [place] Cellar

## 2016-02-21 ENCOUNTER — Encounter: Payer: Self-pay | Admitting: Gastroenterology

## 2016-02-21 ENCOUNTER — Ambulatory Visit (INDEPENDENT_AMBULATORY_CARE_PROVIDER_SITE_OTHER): Payer: Medicare Other | Admitting: Gastroenterology

## 2016-02-21 VITALS — BP 104/70 | HR 88 | Ht 65.0 in | Wt 140.0 lb

## 2016-02-21 DIAGNOSIS — K649 Unspecified hemorrhoids: Secondary | ICD-10-CM

## 2016-02-21 NOTE — Progress Notes (Signed)
PROCEDURE NOTE: The patient presents with symptomatic grade I hemorrhoids, requesting rubber band ligation of his/her hemorrhoidal disease.  All risks, benefits and alternative forms of therapy were described and informed consent was obtained.   The anorectum was pre-medicated with 0.125% nitroglycerin The decision was made to band the RA internal hemorrhoid, and the [place] was used to perform band ligation without complication.  Digital anorectal examination was then performed to assure proper positioning of the band, and to adjust the banded tissue as required.  The patient was discharged home without pain or other issues.  Dietary and behavioral recommendations were given and along with follow-up instructions.     The following adjunctive treatments were recommended: Daily fiber supplement  The patient will return in as needed for further symptoms. She has had 3 bandings now, no further treatment planned, she has responded quite well to therapy to date. . No complications were encountered and the patient tolerated the procedure well.  [place] Cellar, MD [facility] Pager [phone]

## 2016-02-21 NOTE — Patient Instructions (Signed)
HEMORRHOID BANDING PROCEDURE    FOLLOW-UP CARE   1. The procedure you have had should have been relatively painless since the banding of the area involved does not have nerve endings and there is no pain sensation.  The rubber band cuts off the blood supply to the hemorrhoid and the band may fall off as soon as 48 hours after the banding (the band may occasionally be seen in the toilet bowl following a bowel movement). You may notice a temporary feeling of fullness in the rectum which should respond adequately to plain Tylenol or Motrin.  2. Following the banding, avoid strenuous exercise that evening and resume full activity the next day.  A sitz bath (soaking in a warm tub) or bidet is soothing, and can be useful for cleansing the area after bowel movements.     3. To avoid constipation, take two tablespoons of natural wheat bran, natural oat bran, flax, Benefiber or any over the counter fiber supplement and increase your water intake to 7-8 glasses daily.    4. Unless you have been prescribed anorectal medication, do not put anything inside your rectum for two weeks: No suppositories, enemas, fingers, etc.  5. Occasionally, you may have more bleeding than usual after the banding procedure.  This is often from the untreated hemorrhoids rather than the treated one.  Don't be concerned if there is a tablespoon or so of blood.  If there is more blood than this, lie flat with your bottom higher than your head and apply an ice pack to the area. If the bleeding does not stop within a half an hour or if you feel faint, call our office at [phone] or go to the emergency room.  6. Problems are not common; however, if there is a substantial amount of bleeding, severe pain, chills, fever or difficulty passing urine (very rare) or other problems, you should call us at [phone] or report to the nearest emergency room.  7. Do not stay seated continuously for more than 2-3 hours for a day or two  after the procedure.  Tighten your buttock muscles 10-15 times every two hours and take 10-15 deep breaths every 1-2 hours.  Do not spend more than a few minutes on the toilet if you cannot empty your bowel; instead re-visit the toilet at a later time.    If you are age 31 or older, your body mass index should be between 23-30. Your Body mass index is 23.3 kg/(m^2). If this is out of the aforementioned range listed, please consider follow up with your Primary Care Provider.  If you are age 73 or younger, your body mass index should be between 19-25. Your Body mass index is 23.3 kg/(m^2). If this is out of the aformentioned range listed, please consider follow up with your Primary Care Provider.

## 2016-03-12 NOTE — Progress Notes (Signed)
[place]  [phone] Fax:([phone]) [phone]     ID: [name] DOB: 1966/01/05  MR#: [id number]  NHA#:[id number]  Patient Care Team: Lori Norlander, DO as PCP - General Lori Lever, MD (Pulmonary Disease) Lori Sine, MD as Consulting Physician (Dermatology) Lori Gunning, MD as Consulting Physician (Gastroenterology) PCP: Lori Doss, DO GYN: SU:  OTHER MD: [name] M.D., [name] M.D., Lori Skeans MD, Lori Sine MD  CHIEF COMPLAINT: malignant melanoma, colon carcinoma, lung cancer  CURRENT TREATMENT: Observation   HISTORY OF PRESENT ILLNESS:  From the prior summary:  I know [name] from her remote history of lung carcinoma, which has not recurred. More recently, her significant other [name] in her right posterior calf. He keeps a picture [name], and told her he was "99% sure" the [name] there would be a melanoma. The patient brought it to Dr. Marjean [name] attention and a punch biopsy was obtained 09/06/2014. This showed ([id number]) malignant melanoma, with a maximum thickness of 0.33 mm, anatomic level III, no ulceration, with negative deep margins but clear peripheral margins. The mitotic index was low, less than one per square millimeter. There was no evidence of lymphovascular invasion. There was brisk tumor infiltrating lymphocytes. Tumor regression was noted across the full aspect of the punch biopsy.   This was read as a pathologic stage TIa, and the patient was referred to [name] for wide excision, performed 09/13/2014. [name] note indicates a 1 cm margin around the initial biopsy site. The final pathology ([id number]) showed a tumor thickness of 1.15, anatomic level III, with ulceration seen only in the area of prior biopsy, (and therefore read as negative). Peripheral as well as deep margins were free. Again the mitotic  index was low there was no evidence of vascular invasion and tumor infiltrating lymphocytes were brisk. This was read as a pT2a [name].   The patient is referred for further evaluation and treatment.  INTERVAL HISTORY: [name] returns today for follow-up of her multiple cancers. The interval history is generally unremarkable. She has established herself with GI and had a repeat colonoscopy 11/29/2015. Marland Kitchen She still had multiple adenomas removed and there is a recommendation for repeat colonoscopy March 2018. Genetics consultation was also recommended  REVIEW OF SYSTEMS: [name] looks tanned but it is "break". She knows to stay out of the sun and sees [name] regularly. She has normal bowel movements. There has been no bleeding or black or tarry movements. She describes herself is moderately fatigued. She has blurred vision at times, but most of the time is not a problem. She has mild sinus symptoms. She Is short of breath with walking and climbing stairs. She has stress urinary incontinence. She feels anxious, but not depressed. She has hot flashes. A detailed review of systems was otherwise stable.  PAST MEDICAL HISTORY: Past Medical History  Diagnosis Date  . External hemorrhoids   . Allergic rhinitis   . 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 Chronic bronchitis   . History of lung cancer   . Panic attacks   . Anxiety disorder   . Dyspepsia   . Insomnia   . Goiter   . Migraine, unspecified, without mention of intractable migraine without mention of status migrainosus   . Female stress incontinence   . Dizziness and giddiness   . EXTERNAL HEMORRHOIDS 09/27/2009  . 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 Hypercholesteremia  denies-last check normal labs  . GERD (gastroesophageal reflux disease)     occ depending on diet   . COPD (chronic obstructive pulmonary disease) ([place])   . Cough   . Seizures ([place])     very long ago like 15 years ago or so ,  questionable etilogy  . Lung cancer ([place])   . Skin cancer (melanoma) ([place])     right calf  . Colon cancer ([place])     adenocarcinoma in a polyps 11-24-2014  . Emphysema of lung ([place])     PAST SURGICAL HISTORY: Past Surgical History  Procedure Laterality Date  . Bronchoscopy  01/31/2005    nonspecific inflammation  . Spirometry  07/03/2002    min obstruction,mild restriction 01/31/2005  . Tvh  12/01/2000    hysterectomy  . Portacath removed  10/2007  . Biopsy of right neck mass      negative-thyroid biopsy   . Bronchoscopy  08/24/2010    nonspecific inflammation  . Lung removal, partial      for lung cancer--followed by Dr. Arlyce Dice and Dr. Annamaria Boots  . Melanoma removal      right calf   . Melanoma excision with sentinel lymph node biopsy Right 11/29/2014    Procedure: RIGHT INGUINAL SENTINEL LYMPH NODE BIOPSY;  Surgeon: Lori Skeans, MD;  Location: [name];  Service: General;  Laterality: Right;  . Colonoscopy    . Polypectomy      FAMILY HISTORY Family History  Problem Relation Age of Onset  . Lung cancer Father   . Diabetes Maternal Grandfather   . Colon polyps Brother   . Colon cancer Neg Hx   . Rectal cancer Neg Hx   . Stomach cancer Neg Hx    the patient's father died in his mid 70s from lung cancer. The patient's mother is living, in her 36s. The patient has one brother, no sisters. There is no other history of cancer in the family to her knowledge  GYNECOLOGIC HISTORY:  No LMP recorded. Patient has had a hysterectomy. Menarche age 66, first live birth age 67. The patient is GX P1. She had a hysterectomy approximately 2001. She did not undergo salpingo-oophorectomy. She did not take hormone replacement  SOCIAL HISTORY:  She is disabled and lives by herself, with a cat. Her significant other, [name], works as a Chief Strategy Officer. Her daughter [name], 12 years old as of March 2016, lives in [place] and in addition to working has 4 children. [name]'s husband works for an Chiropractor, putting up signs    ADVANCED DIRECTIVES: Not in place. The patient intends to name her mother [name] as healthcare power of attorney. She can be reached at [phone]. At the 11/07/2014 visit patient received the appropriate documents 2 complete and notarize at her discretion   HEALTH MAINTENANCE: Social History  Substance Use Topics  . Smoking status: Former Smoker    Types: Cigarettes    Quit date: 10/26/2001  . Smokeless tobacco: Never Used     Comment: Significant passive exposure from boy friend  . Alcohol Use: No     Comment: history of alcohol abuse 20-30    Mammography: March 2016  Colonoscopy: Due 10/2016  PAP: Status post hysterectomy  Bone density:  Lipid panel:  No Known Allergies  Current Outpatient Prescriptions  Medication Sig Dispense Refill  . acetaminophen (TYLENOL) 650 MG CR tablet Take 650 mg by mouth every 8 (eight) hours as needed for pain.    Marland Kitchen albuterol (PROVENTIL) (2.5 MG/3ML) 0.083%  nebulizer solution Take 3 mLs (2.5 mg total) by nebulization every 6 (six) hours as needed for wheezing or shortness of breath. 75 vial prn  . Albuterol Sulfate (PROAIR RESPICLICK) 761 (90 BASE) MCG/ACT AEPB Inhale 2 puffs into the lungs every 6 (six) hours as needed. 1 each 11  . ALPRAZolam (XANAX) 1 MG tablet Take 1 tablet (1 mg total) by mouth 2 (two) times daily as needed for anxiety. 60 tablet 5  . Fluticasone Furoate-Vilanterol (BREO ELLIPTA) 100-25 MCG/INH AEPB Inhale 1 puff into the lungs daily. Rinse after each use 1 each prn  . polyethylene glycol powder (GLYCOLAX/MIRALAX) powder Take 17 g by mouth 2 (two) times daily as needed. 3350 g 1  . topiramate (TOPAMAX) 50 MG tablet TAKE 1 1/2 TABLET BY MOUTH TWICE DAILY. 270 tablet 4   No current facility-administered medications for this visit.    OBJECTIVE: Middle-aged white woman in no acute distress  Filed Vitals:   03/13/16 1135  BP: 127/81  Pulse: 86  Temp: 97.5 F (36.4 C)  Resp: 18     Body mass  index is 24.21 kg/(m^2).    ECOG FS:1 - Symptomatic but completely ambulatory  Sclerae unicteric, EOMs intact Oropharynx clear and moist No cervical or supraclavicular adenopathy Lungs no rales or rhonchi Heart regular rate and rhythm Abd soft, nontender, positive bowel sounds MSK no focal spinal tenderness, no upper extremity lymphedema Neuro: nonfocal, well oriented, appropriate affect Breasts: Deferred Skin: Right skin status post remote excision, with no evidence of recurrence   LAB RESULTS:  CMP     Component Value Date/Time   NA 138 03/13/2016 1121   NA 140 11/25/2014 0901   NA 148* 07/09/2011 1413   K 3.9 03/13/2016 1121   K 3.6 11/25/2014 0901   K 3.5 07/09/2011 1413   CL 109 11/25/2014 0901   CL 111* 07/09/2012 0958   CL 100 07/09/2011 1413   CO2 26 03/13/2016 1121   CO2 26 11/25/2014 0901   CO2 26 07/09/2011 1413   GLUCOSE 87 03/13/2016 1121   GLUCOSE 84 11/25/2014 0901   GLUCOSE 80 07/09/2012 0958   GLUCOSE 96 07/09/2011 1413   BUN 10.7 03/13/2016 1121   BUN 7 11/25/2014 0901   BUN 11 07/09/2011 1413   CREATININE 0.8 03/13/2016 1121   CREATININE 0.66 11/25/2014 0901   CREATININE 0.77 05/13/2013 1052   CALCIUM 8.8 03/13/2016 1121   CALCIUM 8.9 11/25/2014 0901   CALCIUM 8.9 07/09/2011 1413   PROT 7.4 03/13/2016 1121   PROT 6.7 05/13/2013 1052   PROT 7.9 07/09/2011 1413   ALBUMIN 3.7 03/13/2016 1121   ALBUMIN 3.7 05/13/2013 1052   ALBUMIN 3.6 07/09/2011 1413   AST 15 03/13/2016 1121   AST 17 05/13/2013 1052   AST 17 07/09/2011 1413   ALT 10 03/13/2016 1121   ALT 11 05/13/2013 1052   ALT 16 07/09/2011 1413   ALKPHOS 77 03/13/2016 1121   ALKPHOS 66 05/13/2013 1052   ALKPHOS 94* 07/09/2011 1413   BILITOT 0.66 03/13/2016 1121   BILITOT 0.6 05/13/2013 1052   BILITOT 1.00 07/09/2011 1413   GFRNONAA >90 11/25/2014 0901   GFRAA >90 11/25/2014 0901    INo results found for: SPEP, UPEP  Lab Results  Component Value Date   WBC 7.2 03/13/2016    NEUTROABS 5.2 03/13/2016   HGB 13.3 03/13/2016   HCT 38.4 03/13/2016   MCV 86.6 03/13/2016   PLT 199 03/13/2016      Chemistry  Component Value Date/Time   NA 138 03/13/2016 1121   NA 140 11/25/2014 0901   NA 148* 07/09/2011 1413   K 3.9 03/13/2016 1121   K 3.6 11/25/2014 0901   K 3.5 07/09/2011 1413   CL 109 11/25/2014 0901   CL 111* 07/09/2012 0958   CL 100 07/09/2011 1413   CO2 26 03/13/2016 1121   CO2 26 11/25/2014 0901   CO2 26 07/09/2011 1413   BUN 10.7 03/13/2016 1121   BUN 7 11/25/2014 0901   BUN 11 07/09/2011 1413   CREATININE 0.8 03/13/2016 1121   CREATININE 0.66 11/25/2014 0901   CREATININE 0.77 05/13/2013 1052      Component Value Date/Time   CALCIUM 8.8 03/13/2016 1121   CALCIUM 8.9 11/25/2014 0901   CALCIUM 8.9 07/09/2011 1413   ALKPHOS 77 03/13/2016 1121   ALKPHOS 66 05/13/2013 1052   ALKPHOS 94* 07/09/2011 1413   AST 15 03/13/2016 1121   AST 17 05/13/2013 1052   AST 17 07/09/2011 1413   ALT 10 03/13/2016 1121   ALT 11 05/13/2013 1052   ALT 16 07/09/2011 1413   BILITOT 0.66 03/13/2016 1121   BILITOT 0.6 05/13/2013 1052   BILITOT 1.00 07/09/2011 1413       Lab Results  Component Value Date   LABCA2 26 07/21/2007    No components found for: [id number]  No results for input(s): INR in the last 168 hours.  Urinalysis    Component Value Date/Time   LABSPEC 1.005 07/16/2012 1207   PHURINE 6.0 07/16/2012 1207   HGBUR Negative 07/16/2012 1207   BILIRUBINUR Negative 07/16/2012 1207   KETONESUR Negative 07/16/2012 1207   PROTEINUR Negative 07/16/2012 1207   NITRITE Negative 07/16/2012 1207   LEUKOCYTESUR Trace 07/16/2012 1207    STUDIES: CLINICAL DATA: Screening.  EXAM: 2D DIGITAL SCREENING BILATERAL MAMMOGRAM WITH CAD AND ADJUNCT TOMO  COMPARISON: Previous exam(s).  ACR Breast Density Category c: The breast tissue is heterogeneously dense, which may obscure small masses.  FINDINGS: There are no findings suspicious for  malignancy. Images were processed with CAD.  IMPRESSION: No mammographic evidence of malignancy. A result letter of this screening mammogram will be mailed directly to the patient.  RECOMMENDATION: Screening mammogram in one year. (Code:SM-B-01Y)  BI-RADS CATEGORY 1: Negative.   Electronically Signed  By: Lovey Newcomer M.D.  On: 11/17/2015 15:40   ASSESSMENT: 50 y.o. [place] woman  A: LUNG CANCER  (1) status post right upper lobe wedge resection of a T1 N2, stage IIIA non-small cell lung cancer (adenocarcinoma) measuring 1.8 cm, poorly differentiated, with negative margins, but with 4 positive lymph nodes, 3 N1 and 1 N2;   (a) on [place] 2004 to 2009  (b) no evidence of disease recurrence on most recent chest x-ray 08/19/2014, and likely cured  (2) ashthmatic bronchitis/ COPD  (3) tobacco abuse: The patient quit smoking 2001  B: MALIGNANT MELANOMA  (4) s/p punch biopsy Right posterior calf for malignant melanoma 0.33 mm thickness, level III, w/o ulceration and with a low mitotic index, free deep but positive lateral margins  (5) status post wide excision 09/13/2014 showing a maximum tumor thickness of 1.15 mm, and anatomic level III, with ulceration noted only at the site of prior biopsy, with clear margins( 1 cm per surgical note) with a low mitotic index and brisk infiltration by tumor infiltrating lymphocytes: pT2a NX MX = stage IB  (6) right inguinal sentinel lymph node biopsy 11/29/2014 showed no evidence of melanoma  C: COLON CANCER (7) colonoscopy  11/29/2014 shows adenocarcinoma in  ascending colon polyp; repeat colonoscopy 12/30/2014 removed a tubular adenoma in that area which was negative for malignancy. The area was marked for possible definitive surgery  (a) Repeat colonoscopy with polypectomy 12/30/2014 showed tubular adenomas.   PLAN: Rodneisha is doing remarkably well as far as her 3 cancers are concerned. She has excellent GI and dermatologic  follow-up. There has been no evidence of disease recurrence of her melanoma or her remote lung cancer. She continues on active colonoscopic surveillance.  I thank Dr. Doyne Keel recommendation for genetics testing is a good one and I will ask our counselors to contact the patient. At a minimum she should have her microsatellite instability reviewed  I am going to see her again in April 2018, after her March mammography. She knows to call for any problems that may develop before that visit.   Chauncey Cruel, MD   03/15/2016 4:07 PM Medical Oncology and Hematology [facility] [address] [place], [place] [address] Tel. [phone]    Fax. [phone]

## 2016-03-13 ENCOUNTER — Ambulatory Visit (HOSPITAL_BASED_OUTPATIENT_CLINIC_OR_DEPARTMENT_OTHER): Payer: Medicare Other | Admitting: Oncology

## 2016-03-13 ENCOUNTER — Other Ambulatory Visit (HOSPITAL_BASED_OUTPATIENT_CLINIC_OR_DEPARTMENT_OTHER): Payer: Medicare Other

## 2016-03-13 ENCOUNTER — Telehealth: Payer: Self-pay | Admitting: Oncology

## 2016-03-13 VITALS — BP 127/81 | HR 86 | Temp 97.5°F | Resp 18 | Ht 65.0 in | Wt 145.5 lb

## 2016-03-13 DIAGNOSIS — Z85038 Personal history of other malignant neoplasm of large intestine: Secondary | ICD-10-CM

## 2016-03-13 DIAGNOSIS — Z85820 Personal history of malignant melanoma of skin: Secondary | ICD-10-CM

## 2016-03-13 DIAGNOSIS — C189 Malignant neoplasm of colon, unspecified: Secondary | ICD-10-CM

## 2016-03-13 DIAGNOSIS — Z85118 Personal history of other malignant neoplasm of bronchus and lung: Secondary | ICD-10-CM

## 2016-03-13 DIAGNOSIS — C4371 Malignant melanoma of right lower limb, including hip: Secondary | ICD-10-CM

## 2016-03-13 DIAGNOSIS — C3491 Malignant neoplasm of unspecified part of right bronchus or lung: Secondary | ICD-10-CM

## 2016-03-13 LAB — CBC WITH DIFFERENTIAL/PLATELET
BASO%: 0.9 % (ref 0.0–2.0)
BASOS ABS: 0.1 10*3/uL (ref 0.0–0.1)
EOS ABS: 0.1 10*3/uL (ref 0.0–0.5)
EOS%: 1.5 % (ref 0.0–7.0)
HEMATOCRIT: 38.4 % (ref 34.8–46.6)
HGB: 13.3 g/dL (ref 11.6–15.9)
LYMPH#: 1.3 10*3/uL (ref 0.9–3.3)
LYMPH%: 17.8 % (ref 14.0–49.7)
MCH: 30 pg (ref 25.1–34.0)
MCHC: 34.6 g/dL (ref 31.5–36.0)
MCV: 86.6 fL (ref 79.5–101.0)
MONO#: 0.6 10*3/uL (ref 0.1–0.9)
MONO%: 7.9 % (ref 0.0–14.0)
NEUT#: 5.2 10*3/uL (ref 1.5–6.5)
NEUT%: 71.9 % (ref 38.4–76.8)
Platelets: 199 10*3/uL (ref 145–400)
RBC: 4.43 10*6/uL (ref 3.70–5.45)
RDW: 13.8 % (ref 11.2–14.5)
WBC: 7.2 10*3/uL (ref 3.9–10.3)

## 2016-03-13 LAB — COMPREHENSIVE METABOLIC PANEL
ALK PHOS: 77 U/L (ref 40–150)
ALT: 10 U/L (ref 0–55)
AST: 15 U/L (ref 5–34)
Albumin: 3.7 g/dL (ref 3.5–5.0)
Anion Gap: 6 mEq/L (ref 3–11)
BUN: 10.7 mg/dL (ref 7.0–26.0)
CALCIUM: 8.8 mg/dL (ref 8.4–10.4)
CHLORIDE: 107 meq/L (ref 98–109)
CO2: 26 mEq/L (ref 22–29)
Creatinine: 0.8 mg/dL (ref 0.6–1.1)
EGFR: 84 mL/min/{1.73_m2} — AB (ref >90)
Glucose: 87 mg/dl (ref 70–140)
POTASSIUM: 3.9 meq/L (ref 3.5–5.1)
Sodium: 138 mEq/L (ref 136–145)
Total Bilirubin: 0.66 mg/dL (ref 0.20–1.20)
Total Protein: 7.4 g/dL (ref 6.4–8.3)

## 2016-03-13 NOTE — Telephone Encounter (Signed)
appt made and avs printed °

## 2016-03-15 ENCOUNTER — Telehealth: Payer: Self-pay | Admitting: Oncology

## 2016-03-15 NOTE — Telephone Encounter (Signed)
Spoke with pt to confirm 8/1 appt at 2pm per order

## 2016-03-28 ENCOUNTER — Encounter: Payer: Self-pay | Admitting: *Deleted

## 2016-03-28 ENCOUNTER — Ambulatory Visit (INDEPENDENT_AMBULATORY_CARE_PROVIDER_SITE_OTHER): Payer: Medicare Other | Admitting: *Deleted

## 2016-03-28 ENCOUNTER — Telehealth: Payer: Self-pay | Admitting: Oncology

## 2016-03-28 VITALS — BP 96/62 | HR 96 | Temp 98.0°F | Resp 24 | Ht 65.75 in | Wt 145.8 lb

## 2016-03-28 DIAGNOSIS — Z0000 Encounter for general adult medical examination without abnormal findings: Secondary | ICD-10-CM | POA: Diagnosis not present

## 2016-03-28 NOTE — Telephone Encounter (Signed)
pt cld to r/s appt-gave pt r/s time & date for 07/04/16

## 2016-03-28 NOTE — Patient Instructions (Signed)
Fat and Cholesterol Restricted Diet High levels of fat and cholesterol in your blood may lead to various health problems, such as diseases of the heart, blood vessels, gallbladder, liver, and pancreas. Fats are concentrated sources of energy that come in various forms. Certain types of fat, including saturated fat, may be harmful in excess. Cholesterol is a substance needed by your body in small amounts. Your body makes all the cholesterol it needs. Excess cholesterol comes from the food you eat. When you have high levels of cholesterol and saturated fat in your blood, health problems can develop because the excess fat and cholesterol will gather along the walls of your blood vessels, causing them to narrow. Choosing the right foods will help you control your intake of fat and cholesterol. This will help keep the levels of these substances in your blood within normal limits and reduce your risk of disease. WHAT IS MY PLAN? Your health care provider recommends that you:  Get no more than __________ % of the total calories in your daily diet from fat.  Limit your intake of saturated fat to less than ______% of your total calories each day.  Limit the amount of cholesterol in your diet to less than _________mg per day. WHAT TYPES OF FAT SHOULD I CHOOSE?  Choose healthy fats more often. Choose monounsaturated and polyunsaturated fats, such as olive and canola oil, flaxseeds, walnuts, almonds, and seeds.  Eat more omega-3 fats. Good choices include salmon, mackerel, sardines, tuna, flaxseed oil, and ground flaxseeds. Aim to eat fish at least two times a week.  Limit saturated fats. Saturated fats are primarily found in animal products, such as meats, butter, and cream. Plant sources of saturated fats include palm oil, palm kernel oil, and coconut oil.  Avoid foods with partially hydrogenated oils in them. These contain trans fats. Examples of foods that contain trans fats are stick margarine, some  tub margarines, cookies, crackers, and other baked goods. WHAT GENERAL GUIDELINES DO I NEED TO FOLLOW? These guidelines for healthy eating will help you control your intake of fat and cholesterol:  Check food labels carefully to identify foods with trans fats or high amounts of saturated fat.  Fill one half of your plate with vegetables and green salads.  Fill one fourth of your plate with whole grains. Look for the word "whole" as the first word in the ingredient list.  Fill one fourth of your plate with lean protein foods.  Limit fruit to two servings a day. Choose fruit instead of juice.  Eat more foods that contain soluble fiber. Examples of foods that contain this type of fiber are apples, broccoli, carrots, beans, peas, and barley. Aim to get 20-30 g of fiber per day.  Eat more home-cooked food and less restaurant, buffet, and fast food.  Limit or avoid alcohol.  Limit foods high in starch and sugar.  Limit fried foods.  Cook foods using methods other than frying. Baking, boiling, grilling, and broiling are all great options.  Lose weight if you are overweight. Losing just 5-10% of your initial body weight can help your overall health and prevent diseases such as diabetes and heart disease. WHAT FOODS CAN I EAT? Grains Whole grains, such as whole wheat or whole grain breads, crackers, cereals, and pasta. Unsweetened oatmeal, bulgur, barley, quinoa, or brown rice. Corn or whole wheat flour tortillas. Vegetables Fresh or frozen vegetables (raw, steamed, roasted, or grilled). Green salads. Fruits All fresh, canned (in natural juice), or frozen fruits. Meat and  Other Protein Products Ground beef (85% or leaner), grass-fed beef, or beef trimmed of fat. Skinless chicken or [place]. Ground chicken or [place]. Pork trimmed of fat. All fish and seafood. Eggs. Dried beans, peas, or lentils. Unsalted nuts or seeds. Unsalted canned or dry beans. Dairy Low-fat dairy products, such as skim  or 1% milk, 2% or reduced-fat cheeses, low-fat ricotta or cottage cheese, or plain low-fat yogurt. Fats and Oils Tub margarines without trans fats. Light or reduced-fat mayonnaise and salad dressings. Avocado. Olive, canola, sesame, or safflower oils. Natural peanut or almond butter (choose ones without added sugar and oil). The items listed above may not be a complete list of recommended foods or beverages. Contact your dietitian for more options. WHAT FOODS ARE NOT RECOMMENDED? Grains White bread. White pasta. White rice. Cornbread. Bagels, pastries, and croissants. Crackers that contain trans fat. Vegetables White potatoes. Corn. Creamed or fried vegetables. Vegetables in a cheese sauce. Fruits Dried fruits. Canned fruit in light or heavy syrup. Fruit juice. Meat and Other Protein Products Fatty cuts of meat. Ribs, chicken wings, bacon, sausage, bologna, salami, chitterlings, fatback, hot dogs, bratwurst, and packaged luncheon meats. Liver and organ meats. Dairy Whole or 2% milk, cream, half-and-half, and cream cheese. Whole milk cheeses. Whole-fat or sweetened yogurt. Full-fat cheeses. Nondairy creamers and whipped toppings. Processed cheese, cheese spreads, or cheese curds. Sweets and Desserts Corn syrup, sugars, honey, and molasses. Candy. Jam and jelly. Syrup. Sweetened cereals. Cookies, pies, cakes, donuts, muffins, and ice cream. Fats and Oils Butter, stick margarine, lard, shortening, ghee, or bacon fat. Coconut, palm kernel, or palm oils. Beverages Alcohol. Sweetened drinks (such as sodas, lemonade, and fruit drinks or punches). The items listed above may not be a complete list of foods and beverages to avoid. Contact your dietitian for more information.   This information is not intended to replace advice given to you by your health care provider. Make sure you discuss any questions you have with your health care provider.   Document Released: 08/19/2005 Document Revised:  09/09/2014 Document Reviewed: 11/17/2013 Elsevier Interactive Patient Education 2016 [place] in the Home  Falls can cause injuries. They can happen to people of all ages. There are many things you can do to make your home safe and to help prevent falls.  WHAT CAN I DO ON THE OUTSIDE OF MY HOME?  Regularly fix the edges of walkways and driveways and fix any cracks.  Remove anything that might make you trip as you walk through a door, such as a raised step or threshold.  Trim any bushes or trees on the path to your home.  Use bright outdoor lighting.  Clear any walking paths of anything that might make someone trip, such as rocks or tools.  Regularly check to see if handrails are loose or broken. Make sure that both sides of any steps have handrails.  Any raised decks and porches should have guardrails on the edges.  Have any leaves, snow, or ice cleared regularly.  Use sand or salt on walking paths during winter.  Clean up any spills in your garage right away. This includes oil or grease spills. WHAT CAN I DO IN THE BATHROOM?   Use night lights.  Install grab bars by the toilet and in the tub and shower. Do not use towel bars as grab bars.  Use non-skid mats or decals in the tub or shower.  If you need to sit down in the shower, use a plastic,  non-slip stool.  Keep the floor dry. Clean up any water that spills on the floor as soon as it happens.  Remove soap buildup in the tub or shower regularly.  Attach bath mats securely with double-sided non-slip rug tape.  Do not have throw rugs and other things on the floor that can make you trip. WHAT CAN I DO IN THE BEDROOM?  Use night lights.  Make sure that you have a light by your bed that is easy to reach.  Do not use any sheets or blankets that are too big for your bed. They should not hang down onto the floor.  Have a firm chair that has side arms. You can use this for support while you get  dressed.  Do not have throw rugs and other things on the floor that can make you trip. WHAT CAN I DO IN THE KITCHEN?  Clean up any spills right away.  Avoid walking on wet floors.  Keep items that you use a lot in easy-to-reach places.  If you need to reach something above you, use a strong step stool that has a grab bar.  Keep electrical cords out of the way.  Do not use floor polish or wax that makes floors slippery. If you must use wax, use non-skid floor wax.  Do not have throw rugs and other things on the floor that can make you trip. WHAT CAN I DO WITH MY STAIRS?  Do not leave any items on the stairs.  Make sure that there are handrails on both sides of the stairs and use them. Fix handrails that are broken or loose. Make sure that handrails are as long as the stairways.  Check any carpeting to make sure that it is firmly attached to the stairs. Fix any carpet that is loose or worn.  Avoid having throw rugs at the top or bottom of the stairs. If you do have throw rugs, attach them to the floor with carpet tape.  Make sure that you have a light switch at the top of the stairs and the bottom of the stairs. If you do not have them, ask someone to add them for you. WHAT ELSE CAN I DO TO HELP PREVENT FALLS?  Wear shoes that:  Do not have high heels.  Have rubber bottoms.  Are comfortable and fit you well.  Are closed at the toe. Do not wear sandals.  If you use a stepladder:  Make sure that it is fully opened. Do not climb a closed stepladder.  Make sure that both sides of the stepladder are locked into place.  Ask someone to hold it for you, if possible.  Clearly mark and make sure that you can see:  Any grab bars or handrails.  First and last steps.  Where the edge of each step is.  Use tools that help you move around (mobility aids) if they are needed. These include:  Canes.  Walkers.  Scooters.  Crutches.  Turn on the lights when you go into a  dark area. Replace any light bulbs as soon as they burn out.  Set up your furniture so you have a clear path. Avoid moving your furniture around.  If any of your floors are uneven, fix them.  If there are any pets around you, be aware of where they are.  Review your medicines with your doctor. Some medicines can make you feel dizzy. This can increase your chance of falling. Ask your doctor what other things  that you can do to help prevent falls.   This information is not intended to replace advice given to you by your health care provider. Make sure you discuss any questions you have with your health care provider.   Document Released: 06/15/2009 Document Revised: 01/03/2015 Document Reviewed: 09/23/2014 Elsevier Interactive Patient Education 2016 [place] Maintenance, Female Adopting a healthy lifestyle and getting preventive care can go a long way to promote health and wellness. Talk with your health care provider about what schedule of regular examinations is right for you. This is a good chance for you to check in with your provider about disease prevention and staying healthy. In between checkups, there are plenty of things you can do on your own. Experts have done a lot of research about which lifestyle changes and preventive measures are most likely to keep you healthy. Ask your health care provider for more information. WEIGHT AND DIET  Eat a healthy diet  Be sure to include plenty of vegetables, fruits, low-fat dairy products, and lean protein.  Do not eat a lot of foods high in solid fats, added sugars, or salt.  Get regular exercise. This is one of the most important things you can do for your health.  Most adults should exercise for at least 150 minutes each week. The exercise should increase your heart rate and make you sweat (moderate-intensity exercise).  Most adults should also do strengthening exercises at least twice a week. This is in addition to the  moderate-intensity exercise.  Maintain a healthy weight  Body mass index (BMI) is a measurement that can be used to identify possible weight problems. It estimates body fat based on height and weight. Your health care provider can help determine your BMI and help you achieve or maintain a healthy weight.  For females 20 years of age and older:   A BMI below 18.5 is considered underweight.  A BMI of 18.5 to 24.9 is normal.  A BMI of 25 to 29.9 is considered overweight.  A BMI of 30 and above is considered obese.  Watch levels of cholesterol and blood lipids  You should start having your blood tested for lipids and cholesterol at 52 years of age, then have this test every 5 years.  You may need to have your cholesterol levels checked more often if:  Your lipid or cholesterol levels are high.  You are older than 52 years of age.  You are at high risk for heart disease.  CANCER SCREENING   Lung Cancer  Lung cancer screening is recommended for adults 48-23 years old who are at high risk for lung cancer because of a history of smoking.  A yearly low-dose CT scan of the lungs is recommended for people who:  Currently smoke.  Have quit within the past 15 years.  Have at least a 30-pack-year history of smoking. A pack year is smoking an average of one pack of cigarettes a day for 1 year.  Yearly screening should continue until it has been 15 years since you quit.  Yearly screening should stop if you develop a health problem that would prevent you from having lung cancer treatment.  Breast Cancer  Practice breast self-awareness. This means understanding how your breasts normally appear and feel.  It also means doing regular breast self-exams. Let your health care provider know about any changes, no matter how small.  If you are in your 20s or 30s, you should have a clinical breast exam (CBE)  by a health care provider every 1-3 years as part of a regular health exam.  If  you are 77 or older, have a CBE every year. Also consider having a breast X-ray (mammogram) every year.  If you have a family history of breast cancer, talk to your health care provider about genetic screening.  If you are at high risk for breast cancer, talk to your health care provider about having an MRI and a mammogram every year.  Breast cancer gene (BRCA) assessment is recommended for women who have family members with BRCA-related cancers. BRCA-related cancers include:  Breast.  Ovarian.  Tubal.  Peritoneal cancers.  Results of the assessment will determine the need for genetic counseling and BRCA1 and BRCA2 testing. Cervical Cancer Your health care provider may recommend that you be screened regularly for cancer of the pelvic organs (ovaries, uterus, and vagina). This screening involves a pelvic examination, including checking for microscopic changes to the surface of your cervix (Pap test). You may be encouraged to have this screening done every 3 years, beginning at age 28.  For women ages 60-65, health care providers may recommend pelvic exams and Pap testing every 3 years, or they may recommend the Pap and pelvic exam, combined with testing for human papilloma virus (HPV), every 5 years. Some types of HPV increase your risk of cervical cancer. Testing for HPV may also be done on women of any age with unclear Pap test results.  Other health care providers may not recommend any screening for nonpregnant women who are considered low risk for pelvic cancer and who do not have symptoms. Ask your health care provider if a screening pelvic exam is right for you.  If you have had past treatment for cervical cancer or a condition that could lead to cancer, you need Pap tests and screening for cancer for at least 20 years after your treatment. If Pap tests have been discontinued, your risk factors (such as having a new sexual partner) need to be reassessed to determine if screening should  resume. Some women have medical problems that increase the chance of getting cervical cancer. In these cases, your health care provider may recommend more frequent screening and Pap tests. Colorectal Cancer  This type of cancer can be detected and often prevented.  Routine colorectal cancer screening usually begins at 52 years of age and continues through 52 years of age.  Your health care provider may recommend screening at an earlier age if you have risk factors for colon cancer.  Your health care provider may also recommend using home test kits to check for hidden blood in the stool.  A small camera at the end of a tube can be used to examine your colon directly (sigmoidoscopy or colonoscopy). This is done to check for the earliest forms of colorectal cancer.  Routine screening usually begins at age 68.  Direct examination of the colon should be repeated every 5-10 years through 52 years of age. However, you may need to be screened more often if early forms of precancerous polyps or small growths are found. Skin Cancer  Check your skin from head to toe regularly.  Tell your health care provider about any new moles or changes in moles, especially if there is a change in a mole's shape or color.  Also tell your health care provider if you have a mole that is larger than the size of a pencil eraser.  Always use sunscreen. Apply sunscreen liberally and repeatedly throughout the  day.  Protect yourself by wearing long sleeves, pants, a wide-brimmed hat, and sunglasses whenever you are outside. HEART DISEASE, DIABETES, AND HIGH BLOOD PRESSURE   High blood pressure causes heart disease and increases the risk of stroke. High blood pressure is more likely to develop in:  People who have blood pressure in the high end of the normal range (130-139/85-89 mm Hg).  People who are overweight or obese.  People who are African American.  If you are 23-77 years of age, have your blood pressure  checked every 3-5 years. If you are 19 years of age or older, have your blood pressure checked every year. You should have your blood pressure measured twice--once when you are at a hospital or clinic, and once when you are not at a hospital or clinic. Record the average of the two measurements. To check your blood pressure when you are not at a hospital or clinic, you can use:  An automated blood pressure machine at a pharmacy.  A home blood pressure monitor.  If you are between 32 years and 25 years old, ask your health care provider if you should take aspirin to prevent strokes.  Have regular diabetes screenings. This involves taking a blood sample to check your fasting blood sugar level.  If you are at a normal weight and have a low risk for diabetes, have this test once every three years after 52 years of age.  If you are overweight and have a high risk for diabetes, consider being tested at a younger age or more often. PREVENTING INFECTION  Hepatitis B  If you have a higher risk for hepatitis B, you should be screened for this virus. You are considered at high risk for hepatitis B if:  You were born in a country where hepatitis B is common. Ask your health care provider which countries are considered high risk.  Your parents were born in a high-risk country, and you have not been immunized against hepatitis B (hepatitis B vaccine).  You have HIV or AIDS.  You use needles to inject street drugs.  You live with someone who has hepatitis B.  You have had sex with someone who has hepatitis B.  You get hemodialysis treatment.  You take certain medicines for conditions, including cancer, organ transplantation, and autoimmune conditions. Hepatitis C  Blood testing is recommended for:  Everyone born from [birth year] through 1967.  Anyone with known risk factors for hepatitis C. Sexually transmitted infections (STIs)  You should be screened for sexually transmitted infections (STIs)  including gonorrhea and chlamydia if:  You are sexually active and are younger than 52 years of age.  You are older than 52 years of age and your health care provider tells you that you are at risk for this type of infection.  Your sexual activity has changed since you were last screened and you are at an increased risk for chlamydia or gonorrhea. Ask your health care provider if you are at risk.  If you do not have HIV, but are at risk, it may be recommended that you take a prescription medicine daily to prevent HIV infection. This is called pre-exposure prophylaxis (PrEP). You are considered at risk if:  You are sexually active and do not regularly use condoms or know the HIV status of your partner(s).  You take drugs by injection.  You are sexually active with a partner who has HIV. Talk with your health care provider about whether you are at high risk of  being infected with HIV. If you choose to begin PrEP, you should first be tested for HIV. You should then be tested every 3 months for as long as you are taking PrEP.  PREGNANCY   If you are premenopausal and you may become pregnant, ask your health care provider about preconception counseling.  If you may become pregnant, take 400 to 800 micrograms (mcg) of folic acid every day.  If you want to prevent pregnancy, talk to your health care provider about birth control (contraception). OSTEOPOROSIS AND MENOPAUSE   Osteoporosis is a disease in which the bones lose minerals and strength with aging. This can result in serious bone fractures. Your risk for osteoporosis can be identified using a bone density scan.  If you are 41 years of age or older, or if you are at risk for osteoporosis and fractures, ask your health care provider if you should be screened.  Ask your health care provider whether you should take a calcium or vitamin D supplement to lower your risk for osteoporosis.  Menopause may have certain physical symptoms and  risks.  Hormone replacement therapy may reduce some of these symptoms and risks. Talk to your health care provider about whether hormone replacement therapy is right for you.  HOME CARE INSTRUCTIONS   Schedule regular health, dental, and eye exams.  Stay current with your immunizations.   Do not use any tobacco products including cigarettes, chewing tobacco, or electronic cigarettes.  If you are pregnant, do not drink alcohol.  If you are breastfeeding, limit how much and how often you drink alcohol.  Limit alcohol intake to no more than 1 drink per day for nonpregnant women. One drink equals 12 ounces of beer, 5 ounces of wine, or 1 ounces of hard liquor.  Do not use street drugs.  Do not share needles.  Ask your health care provider for help if you need support or information about quitting drugs.  Tell your health care provider if you often feel depressed.  Tell your health care provider if you have ever been abused or do not feel safe at home.   This information is not intended to replace advice given to you by your health care provider. Make sure you discuss any questions you have with your health care provider.   Document Released: 03/04/2011 Document Revised: 09/09/2014 Document Reviewed: 07/21/2013 Elsevier Interactive Patient Education 2016 [employer].   Hearing Loss Hearing loss is a partial or total loss of the ability to hear. This can be temporary or permanent, and it can happen in one or both ears. Hearing loss may be referred to as deafness. Medical care is necessary to treat hearing loss properly and to prevent the condition from getting worse. Your hearing may partially or completely come back, depending on what caused your hearing loss and how severe it is. In some cases, hearing loss is permanent. CAUSES Common causes of hearing loss include:   Too much wax in the ear canal.   Infection of the ear canal or middle ear.   Fluid in the middle ear.    Injury to the ear or surrounding area.   An object stuck in the ear.   Prolonged exposure to loud sounds, such as music.  Less common causes of hearing loss include:   Tumors in the ear.   Viral or bacterial infections, such as meningitis.   A hole in the eardrum (perforated eardrum).  Problems with the hearing nerve that sends signals between the brain  and the ear.  Certain medicines.  SYMPTOMS  Symptoms of this condition may include:  Difficulty telling the difference between sounds.  Difficulty following a conversation when there is background noise.  Lack of response to sounds in your environment. This may be most noticeable when you do not respond to startling sounds.  Needing to turn up the volume on the television, radio, etc.  Ringing in the ears.  Dizziness.  Pain in the ears. DIAGNOSIS This condition is diagnosed based on a physical exam and a hearing test (audiometry). The audiometry test will be performed by a hearing specialist (audiologist). You may also be referred to an ear, nose, and throat (ENT) specialist (otolaryngologist).  TREATMENT Treatment for recent onset of hearing loss may include:   Ear wax removal.   Being prescribed medicines to prevent infection (antibiotics).   Being prescribed medicines to reduce inflammation (corticosteroids).  HOME CARE INSTRUCTIONS  If you were prescribed an antibiotic medicine, take it as told by your health care provider. Do not stop taking the antibiotic even if you start to feel better.  Take over-the-counter and prescription medicines only as told by your health care provider.  Avoid loud noises.   Return to your normal activities as told by your health care provider. Ask your health care provider what activities are safe for you.  Keep all follow-up visits as told by your health care provider. This is important. SEEK MEDICAL CARE IF:   You feel dizzy.   You develop new symptoms.    You vomit or feel nauseous.   You have a fever.  SEEK IMMEDIATE MEDICAL CARE IF:  You develop sudden changes in your vision.   You have severe ear pain.   You have new or increased weakness.  You have a severe headache.   This information is not intended to replace advice given to you by your health care provider. Make sure you discuss any questions you have with your health care provider.   Document Released: 08/19/2005 Document Revised: 05/10/2015 Document Reviewed: 01/04/2015 Elsevier Interactive Patient Education [employer].

## 2016-03-28 NOTE — Progress Notes (Signed)
Subjective:   [name] is a 50 y.o. female who presents for Medicare Annual (Subsequent) preventive examination.  Cardiac Risk Factors include: sedentary lifestyle;smoking/ tobacco exposure;dyslipidemia (previous smoker/Passive smoke from boyfriend)     Objective:     Vitals: BP 96/62 (BP Location: Left Arm, Patient Position: Sitting, Cuff Size: Normal)   Pulse 96   Temp 98 F (36.7 C) (Oral)   Resp (!) 24   Ht 5' 5.75" (1.67 m)   Wt 145 lb 12.8 oz (66.1 kg)   SpO2 98%   BMI 23.71 kg/m   Body mass index is 23.71 kg/m.   Tobacco History  Smoking Status  . Former Smoker  . Types: Cigarettes  . Quit date: 10/26/2001  Smokeless Tobacco  . Never Used    Comment: Significant passive exposure from boy friend     Counseling given: Not Answered Patient has no plans to restart smoking. Boyfriend smokes in home. Patient has asked boyfriend to quit or smoke outside without success.  Past Medical History:  Diagnosis Date  . 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 Allergic rhinitis   . Anxiety disorder   . Chronic bronchitis   . Colon cancer ([place])    adenocarcinoma in a polyps 11-24-2014  . COPD (chronic obstructive pulmonary disease) ([place])   . Cough   . Dizziness and giddiness   . Dyspepsia   . Emphysema of lung ([place])   . External hemorrhoids   . EXTERNAL HEMORRHOIDS 09/27/2009  . Female stress incontinence   . GERD (gastroesophageal reflux disease)    occ depending on diet   . Goiter   . History of lung cancer   . Hypercholesteremia    denies-last check normal labs  . 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 Insomnia   . Lung cancer ([place])   . Migraine, unspecified, without mention of intractable migraine without mention of status migrainosus   . Panic attacks   . Seizures ([place])    very long ago like 15 years ago or so , questionable etilogy  . Skin cancer (melanoma) ([place])    right calf   Past Surgical History:  Procedure  Laterality Date  . biopsy of right neck mass     negative-thyroid biopsy   . BRONCHOSCOPY  01/31/2005   nonspecific inflammation  . BRONCHOSCOPY  08/24/2010   nonspecific inflammation  . COLONOSCOPY    . LUNG REMOVAL, PARTIAL     for lung cancer--followed by Dr. Arlyce Dice and Dr. Annamaria Boots  . MELANOMA EXCISION WITH SENTINEL LYMPH NODE BIOPSY Right 11/29/2014   Procedure: RIGHT INGUINAL SENTINEL LYMPH NODE BIOPSY;  Surgeon: Georganna Skeans, MD;  Location: [facility];  Service: General;  Laterality: Right;  . melanoma removal     right calf   . POLYPECTOMY    . portacath removed  10/2007  . SPIROMETRY  07/03/2002   min obstruction,mild restriction 01/31/2005  . tvh  12/01/2000   hysterectomy   Family History  Problem Relation Age of Onset  . Lung cancer Father   . COPD Mother   . Asthma Daughter   . Diabetes Maternal Grandfather   . Colon polyps Brother   . Colon cancer Neg Hx   . Rectal cancer Neg Hx   . Stomach cancer Neg Hx    History  Sexual Activity  . Sexual activity: Not on file    Outpatient Encounter Prescriptions as of 03/28/2016  Medication Sig  . acetaminophen (TYLENOL) 650 MG CR tablet Take 650  mg by mouth every 8 (eight) hours as needed for pain.  Marland Kitchen albuterol (PROVENTIL) (2.5 MG/3ML) 0.083% nebulizer solution Take 3 mLs (2.5 mg total) by nebulization every 6 (six) hours as needed for wheezing or shortness of breath.  . Albuterol Sulfate (PROAIR RESPICLICK) 628 (90 BASE) MCG/ACT AEPB Inhale 2 puffs into the lungs every 6 (six) hours as needed.  . ALPRAZolam (XANAX) 1 MG tablet Take 1 tablet (1 mg total) by mouth 2 (two) times daily as needed for anxiety.  . Fluticasone Furoate-Vilanterol (BREO ELLIPTA) 100-25 MCG/INH AEPB Inhale 1 puff into the lungs daily. Rinse after each use  . polyethylene glycol powder (GLYCOLAX/MIRALAX) powder Take 17 g by mouth 2 (two) times daily as needed.  . topiramate (TOPAMAX) 50 MG tablet TAKE 1 1/2 TABLET BY MOUTH TWICE DAILY.   No  facility-administered encounter medications on file as of 03/28/2016.     Activities of Daily Living In your present state of health, do you have any difficulty performing the following activities: 03/28/2016  Hearing? N  Vision? Y  Difficulty concentrating or making decisions? N  Walking or climbing stairs? Y  Dressing or bathing? N  Doing errands, shopping? N  Preparing Food and eating ? Y  Using the Toilet? N  In the past six months, have you accidently leaked urine? Y  Do you have problems with loss of bowel control? N  Managing your Medications? N  Managing your Finances? N  Housekeeping or managing your Housekeeping? Y  Some recent data might be hidden  Home Safety:  My home has a working smoke alarm:  Yes, 3 in mobile home.           My home throw rugs have been fastened down to the floor or removed:  Yes I have non-slip mats in the bathtub and shower:  Yes         All my home's stairs have railings or bannisters: home is one level but has 5 stairs at entrance with railings on both sides.         My home's floors, stairs and hallways are free from clutter, wires and cords:  Yes        Patient Care Team: [name], DO as PCP - General Deneise Lever, MD (Pulmonary Disease) Harriett Sine, MD as Consulting Physician (Dermatology) Manus Gunning, MD as Consulting Physician (Gastroenterology) Chauncey Cruel, MD as Consulting Physician (Oncology)    Assessment:     Exercise Activities and Dietary recommendations Current Exercise Habits: Home exercise routine, Type of exercise: walking, Time (Minutes): 20, Frequency (Times/Week): 6, Weekly Exercise (Minutes/Week): 120, Exercise limited by: respiratory conditions(s) (COPD)  Goals    . Exercise 150 minutes per week (moderate activity)    . Increase water intake    . Weight (lb) < 135 lb (61.2 kg)          7% weight loss      Fall Risk Fall Risk  03/28/2016 12/01/2014 01/31/2014 11/25/2013  Falls in  the past year? No No No No   Depression Screen PHQ 2/9 Scores 03/28/2016 06/07/2015 12/01/2014 09/06/2014  PHQ - 2 Score 1 0 0 0     Cognitive Testing MMSE - Mini Mental State Exam 11/25/2013  Orientation to time 5  Orientation to Place 5  Registration 3  Attention/ Calculation 0  Recall 0  Language- name 2 objects 2  Language- repeat 1  Language- follow 3 step command 3  Language- read & follow direction  1  Write a sentence 1  Copy design 1  Total score 22  Mini-Cog passed with score 3/5  TUG Test:  Done in 10 seconds. Patient used both hands to push out of chair and to sit back down.   Immunization History  Administered Date(s) Administered  . H1N1 08/05/2008  . Influenza Split 06/25/2011, 05/12/2012  . Influenza Whole 06/03/2008, 07/13/2009, 06/25/2010  . Influenza,inj,Quad PF,36+ Mos 05/13/2013, 06/24/2014, 06/07/2015  . Pneumococcal Conjugate-13 01/31/2014  . Pneumococcal Polysaccharide-23 06/23/2009  . Td 12/31/2001  . Tdap 05/13/2013   Screening Tests Health Maintenance  Topic Date Due  . INFLUENZA VACCINE  04/02/2016  . COLONOSCOPY  11/28/2016  . MAMMOGRAM  11/16/2017  . TETANUS/TDAP  05/14/2023  . Hepatitis C Screening  Completed  . HIV Screening  Completed      Plan:    During the course of the visit the patient was educated and counseled about the following appropriate screening and preventive services:   Vaccines to include Pneumoccal, Influenza, Td, Zostavax  Cardiovascular Disease  Colorectal cancer screening  Bone density screening  Diabetes screening  Mammography/PAP  Nutrition counseling   Patient Instructions (the written plan) was given to the patient.   Velora Heckler, RN  03/28/2016    I have reviewed patient's PMH, PSH, PSurgH, FamHx.  I have reviewed the encounter performed by Lyman Bishop, RN and agree with the above note.    Ashly M. Lajuana Ripple, DO PGY-3, [facility]

## 2016-04-02 ENCOUNTER — Encounter: Payer: Medicare Other | Admitting: Genetic Counselor

## 2016-04-02 ENCOUNTER — Other Ambulatory Visit: Payer: Medicare Other

## 2016-04-02 ENCOUNTER — Encounter: Payer: Self-pay | Admitting: *Deleted

## 2016-06-12 ENCOUNTER — Encounter: Payer: Self-pay | Admitting: Family Medicine

## 2016-06-12 ENCOUNTER — Ambulatory Visit (INDEPENDENT_AMBULATORY_CARE_PROVIDER_SITE_OTHER): Payer: Medicare Other | Admitting: Family Medicine

## 2016-06-12 VITALS — BP 126/85 | HR 101 | Temp 97.9°F | Ht 65.5 in | Wt 150.2 lb

## 2016-06-12 DIAGNOSIS — G4709 Other insomnia: Secondary | ICD-10-CM

## 2016-06-12 DIAGNOSIS — F411 Generalized anxiety disorder: Secondary | ICD-10-CM | POA: Diagnosis not present

## 2016-06-12 DIAGNOSIS — F410 Panic disorder [episodic paroxysmal anxiety] without agoraphobia: Secondary | ICD-10-CM | POA: Diagnosis not present

## 2016-06-12 DIAGNOSIS — Z23 Encounter for immunization: Secondary | ICD-10-CM | POA: Diagnosis not present

## 2016-06-12 MED ORDER — ALPRAZOLAM 1 MG PO TABS: 1.0000 mg | ORAL_TABLET | Freq: Two times a day (BID) | ORAL | 5 refills | Status: DC | PRN

## 2016-06-12 MED ORDER — TRAZODONE HCL 50 MG PO TABS: 50.0000 mg | ORAL_TABLET | Freq: Every evening | ORAL | 3 refills | Status: DC | PRN

## 2016-06-12 NOTE — Patient Instructions (Signed)
I have prescribed Trazodone for sleep.  Take this as needed for sleep.  Decrease your caffeine intake.  Make sure the TV is off and that you are in a quiet, dark room for sleep.  See me in 6 months for anxiety or sooner if needed.  Insomnia Insomnia is a sleep disorder that makes it difficult to fall asleep or to stay asleep. Insomnia can cause tiredness (fatigue), low energy, difficulty concentrating, mood swings, and poor performance at work or school.  There are three different ways to classify insomnia:  Difficulty falling asleep.  Difficulty staying asleep.  Waking up too early in the morning. Any type of insomnia can be long-term (chronic) or short-term (acute). Both are common. Short-term insomnia usually lasts for three months or less. Chronic insomnia occurs at least three times a week for longer than three months. CAUSES  Insomnia may be caused by another condition, situation, or substance, such as:  Anxiety.  Certain medicines.  Gastroesophageal reflux disease (GERD) or other gastrointestinal conditions.  Asthma or other breathing conditions.  Restless legs syndrome, sleep apnea, or other sleep disorders.  Chronic pain.  Menopause. This may include hot flashes.  Stroke.  Abuse of alcohol, tobacco, or illegal drugs.  Depression.  Caffeine.   Neurological disorders, such as Alzheimer disease.  An overactive thyroid (hyperthyroidism). The cause of insomnia may not be known. RISK FACTORS Risk factors for insomnia include:  Gender. Women are more commonly affected than men.  Age. Insomnia is more common as you get older.  Stress. This may involve your professional or personal life.  Income. Insomnia is more common in people with lower income.  Lack of exercise.   Irregular work schedule or night shifts.  Traveling between different time zones. SIGNS AND SYMPTOMS If you have insomnia, trouble falling asleep or trouble staying asleep is the main  symptom. This may lead to other symptoms, such as:  Feeling fatigued.  Feeling nervous about going to sleep.  Not feeling rested in the morning.  Having trouble concentrating.  Feeling irritable, anxious, or depressed. TREATMENT  Treatment for insomnia depends on the cause. If your insomnia is caused by an underlying condition, treatment will focus on addressing the condition. Treatment may also include:   Medicines to help you sleep.  Counseling or therapy.  Lifestyle adjustments. HOME CARE INSTRUCTIONS   Take medicines only as directed by your health care [name].  Keep regular sleeping and waking hours. Avoid naps.  Keep a sleep diary to help you and your health care [name] figure out what could be causing your insomnia. Include:   When you sleep.  When you wake up during the night.  How well you sleep.   How rested you feel the next day.  Any side effects of medicines you are taking.  What you eat and drink.   Make your bedroom a comfortable place where it is easy to fall asleep:  Put up shades or special blackout curtains to block light from outside.  Use a white noise machine to block noise.  Keep the temperature cool.   Exercise regularly as directed by your health care [name]. Avoid exercising right before bedtime.  Use relaxation techniques to manage stress. Ask your health care [name] to suggest some techniques that may work well for you. These may include:  Breathing exercises.  Routines to release muscle tension.  Visualizing peaceful scenes.  Cut back on alcohol, caffeinated beverages, and cigarettes, especially close to bedtime. These can disrupt your sleep.  Do not overeat or eat spicy foods right before bedtime. This can lead to digestive discomfort that can make it hard for you to sleep.  Limit screen use before bedtime. This includes:  Watching TV.  Using your smartphone, tablet, and computer.  Stick to a routine. This  can help you fall asleep faster. Try to do a quiet activity, brush your teeth, and go to bed at the same time each night.  Get out of bed if you are still awake after 15 minutes of trying to sleep. Keep the lights down, but try reading or doing a quiet activity. When you feel sleepy, go back to bed.  Make sure that you drive carefully. Avoid driving if you feel very sleepy.  Keep all follow-up appointments as directed by your health care [name]. This is important. SEEK MEDICAL CARE IF:   You are tired throughout the day or have trouble in your daily routine due to sleepiness.  You continue to have sleep problems or your sleep problems get worse. SEEK IMMEDIATE MEDICAL CARE IF:   You have serious thoughts about hurting yourself or someone else.   This information is not intended to replace advice given to you by your health care [name]. Make sure you discuss any questions you have with your health care [name].   Document Released: 08/16/2000 Document Revised: 05/10/2015 Document Reviewed: 05/20/2014 Elsevier Interactive Patient Education [employer].

## 2016-06-12 NOTE — Progress Notes (Signed)
    Subjective: CC: 6 month follow up HPI: [name] is a 50 y.o. female presenting to clinic today for follow up. Concerns today include:  1. Anxiety Patient reports that anxiety is well controlled with Xanax.  Taking medication twice daily as needed.  Denies anxiety, depressive symptoms.  Endorses insomnia but does not think that her difficulty sleeping is related to her anxiety.  Denies increased stress at home.  2. Migraine She reports that she discontinued Topamax because she had been without headaches.  She reports she stopped it about 2 months ago.  She is trying to get off of medications.  She had no adverse side effects to medication, she simply stopped having headaches.  3. Insomnia Patient reports that she has been having poor sleep for a while.  She was taking Melatonin without help.  She reports she was previously on Ambien but stopped having a need for it a while back.  She reports that she falls asleep for about 2 hours then is fully awake.  Drinks about 3 sodas daily.  She has been cutting back.  Denies nocturia.  Denies stress.  Reports snore.  Denies night time cough, apnea.  Has had 2 sleep studies done in past and were inconclusive.  Social History Reviewed: non smoker but exposed to second hand smoke. FamHx and MedHx reviewed.  Please see EMR.  ROS: Per HPI  Objective: Office vital signs reviewed. BP 126/85   Pulse (!) 101   Temp 97.9 F (36.6 C) (Oral)   Wt 150 lb 3.2 oz (68.1 kg)   BMI 24.43 kg/m   Physical Examination:  General: Awake, alert, well nourished, No acute distress HEENT: Normal, EOMI, MMM Psych: Mood stable, affect appropriate, good eye contact.  GAD 7 : Generalized Anxiety Score 06/12/2016  Nervous, Anxious, on Edge 0  Control/stop worrying 0  Worry too much - different things 0  Trouble relaxing 3  Restless 0  Easily annoyed or irritable 0  Afraid - awful might happen 1  Total GAD 7 Score 4  Anxiety Difficulty Not difficult  at all    Assessment/ Plan: 50 y.o. female   1. PANIC ATTACKS, stable. - ALPRAZolam (XANAX) 1 MG tablet; Take 1 tablet (1 mg total) by mouth 2 (two) times daily as needed for anxiety.  Dispense: 60 tablet; Refill: 5  2. Generalized anxiety disorder, stable.  GAD score 4. - ALPRAZolam (XANAX) 1 MG tablet; Take 1 tablet (1 mg total) by mouth 2 (two) times daily as needed for anxiety.  Dispense: 60 tablet; Refill: 5  3. Other insomnia.  Discussed reluctance to use Ambien in the setting of concurrent use of Xanax.  Patient refractory to Melatonin OTC. - Recommend avoiding caffeine. - Sleep hygiene reviewed - traZODone (DESYREL) 50 MG tablet; Take 1 tablet (50 mg total) by mouth at bedtime as needed for sleep.  Dispense: 30 tablet; Refill: 3  4. Encounter for immunization - Flu Vaccine QUAD 36+ mos IM  Follow up in 6 months or sooner if needed.   [name], DO PGY-2, [place]

## 2016-06-28 ENCOUNTER — Encounter: Payer: Self-pay | Admitting: Internal Medicine

## 2016-06-28 ENCOUNTER — Ambulatory Visit (INDEPENDENT_AMBULATORY_CARE_PROVIDER_SITE_OTHER): Payer: Medicare Other | Admitting: Internal Medicine

## 2016-06-28 ENCOUNTER — Ambulatory Visit (INDEPENDENT_AMBULATORY_CARE_PROVIDER_SITE_OTHER)
Admission: RE | Admit: 2016-06-28 | Discharge: 2016-06-28 | Disposition: A | Discharge status: Home / Self Care | Payer: Medicare Other | Source: Ambulatory Visit | Attending: Internal Medicine | Admitting: Internal Medicine

## 2016-06-28 VITALS — BP 114/60 | HR 100 | Ht 65.0 in | Wt 152.4 lb

## 2016-06-28 DIAGNOSIS — J449 Chronic obstructive pulmonary disease, unspecified: Secondary | ICD-10-CM | POA: Diagnosis not present

## 2016-06-28 DIAGNOSIS — J4541 Moderate persistent asthma with (acute) exacerbation: Secondary | ICD-10-CM

## 2016-06-28 DIAGNOSIS — J8482 Adult pulmonary Langerhans cell histiocytosis: Secondary | ICD-10-CM | POA: Diagnosis not present

## 2016-06-28 DIAGNOSIS — F5101 Primary insomnia: Secondary | ICD-10-CM | POA: Diagnosis not present

## 2016-06-28 LAB — NITRIC OXIDE: NITRIC OXIDE: 5

## 2016-06-28 MED ORDER — ZOLPIDEM TARTRATE 5 MG PO TABS: ORAL_TABLET | ORAL | 5 refills | Status: DC

## 2016-06-28 MED ORDER — ALBUTEROL SULFATE 108 (90 BASE) MCG/ACT IN AEPB: 2.0000 | INHALATION_SPRAY | Freq: Four times a day (QID) | RESPIRATORY_TRACT | 11 refills | Status: DC | PRN

## 2016-06-28 MED ORDER — ALBUTEROL SULFATE (2.5 MG/3ML) 0.083% IN NEBU: 2.5000 mg | INHALATION_SOLUTION | Freq: Four times a day (QID) | RESPIRATORY_TRACT | 99 refills | Status: DC | PRN

## 2016-06-28 NOTE — Assessment & Plan Note (Signed)
Exacerbation now consistent with seasonal weather change. FENO-5 makes an allergic basis unlikely and pollen counts are currently very low. Plan-may need prednisone but I would like to see if we can stabilize her by adding LAMA Incruse. May also need to increase Breo to 200. She will let us know how she does. CXR ordered.

## 2016-06-28 NOTE — Assessment & Plan Note (Signed)
Chest x-ray today looks stable. CT scan last year showed improving small cavitary nodules consistent with clinical improvement. Plan-follow-up with observation

## 2016-06-28 NOTE — Patient Instructions (Addendum)
Order- CXR    Dx COPD with asthma  Order- Walk test on room air    Order- FENO   Dx COPD with asthma  Order- Office Spirometry  Samples x 2 Incruse   Inhale 1 puff once daily     Try this instead of Breo. When you run out of the samples, go back to [place]. See which works better.  Script for [employer] 5 mg

## 2016-06-28 NOTE — Progress Notes (Signed)
Patient ID: [name], female    DOB: 11/22/1965, 50 y.o.   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 Pt states that she has recurring symptoms when the seasons change. Pt states that she uses her albuterol HFA once a day.  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 ago,cough-dry,midchest pain with cough,fever-chill/sweats not cked at home,not sleeping due to coughing,no pnd,headaches,nasal congestion-clear,sob-same,weakness,no appetite,wheezing.Finished Zpak 2 days ago. Acute illness. Significant cough just now beginning to bring up thick sputum, mostly clear.  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 Pt would like to get Rx for Ambien-Trazodone not working-unable to sleep. We discussed her last chest CT. No recent imaging. Feels tighter in chest with more wheeze. Using rescue inhaler about twice daily and continues Breo. CT chest 08/01/2015 IMPRESSION: 1. Status post right upper lobectomy. 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 No evidence of local  tumor recurrence. 2. No evidence of metastatic disease in the chest. 3. 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 4. Mild-to-moderate centrilobular emphysema. Electronically Signed   By: Ilona Sorrel M.D.   On: 08/01/2015 14:49 Walk test on room air 06/28/2016-95%, 95%, 95%, 93%, peak heart rate 120/minute. No desaturation after 3185 feet. Office Spirometry 06/28/2016-limited validity due to cough. Restriction of exhaled volume. FVC 1.79/50%, FEV1 1.64/57%, ratio 0.80. FENO- 5 CXR 06/28/2016(today)-stable no interim change IMPRESSION: Postsurgical/postradiation changes with scarring right suprahilar region and apex again noted. No interim change. Electronically Signed   By: Marcello Moores  Register   On: 06/28/2016 13:26  Review of Systems-See HPI Constitutional:   No-   weight loss, night sweats, fevers, chills, fatigue, lassitude. HEENT:   No-  headaches, difficulty swallowing, tooth/dental problems, sore throat,      +mild sneezing,no- itching, ear ache, nasal congestion, post nasal drip,  CV:  No-   chest pain, orthopnea, PND, swelling in lower extremities, anasarca, dizziness, palpitations Resp: No- acute  shortness of breath with exertion or at rest.             + productive cough,  little non-productive cough,  No- coughing up of blood.               change in color of mucus.  +wheezing.   Skin: No-   rash or lesions. GI:  No-   heartburn, indigestion, abdominal pain, nausea, vomiting,  GU:   MS:  No-   joint pain or swelling.   Neuro-     nothing unusual Psych:  No- change in mood or affect. No depression or anxiety.  No memory loss.   Objective:   Physical Exam General- Alert, Oriented, Affect-appropriate, Distress- +  Mild dyspnea Skin- rash-none, lesions- none, excoriation- none. Always has a somewhat sunburned  look. Lymphadenopathy- none Head- atraumatic            Eyes- Gross vision intact, PERRLA, conjunctivae clear secretions            Ears- Hearing, canals-normal            Nose- stuffy +, no-Septal dev, mucus, polyps, erosion, perforation             Throat- Mallampati II , mucosa clear , drainage- none, tonsils- atrophic Neck- flexible , trachea midline, no stridor , thyroid nl, carotid no bruit Chest - symmetrical excursion , unlabored           Heart/CV- RRR , no murmur , no gallop  , no rub, nl s1 s2                           - JVD- none , edema- none, stasis changes- none, varices- none           Lung-   Wheeze-+ bilateral, cough +, dullness-none, rub- none. Unlabored at rest           Chest wall-  Abd-  Br/ Gen/ Rectal- Not done, not indicated Extrem- cyanosis- none, clubbing, none, atrophy- none, strength- nl Neuro- grossly intact to observation

## 2016-06-28 NOTE — Assessment & Plan Note (Signed)
Primary care tried trazodone but patient finds it totally ineffective and actually alerts her. Plan-Ambien

## 2016-07-03 ENCOUNTER — Encounter: Payer: Self-pay | Admitting: Genetic Counselor

## 2016-07-04 ENCOUNTER — Other Ambulatory Visit: Payer: Medicare Other

## 2016-07-04 ENCOUNTER — Ambulatory Visit (HOSPITAL_BASED_OUTPATIENT_CLINIC_OR_DEPARTMENT_OTHER): Payer: Medicare Other | Admitting: Genetic Counselor

## 2016-07-04 DIAGNOSIS — Z800 Family history of malignant neoplasm of digestive organs: Secondary | ICD-10-CM

## 2016-07-04 DIAGNOSIS — C189 Malignant neoplasm of colon, unspecified: Secondary | ICD-10-CM

## 2016-07-04 DIAGNOSIS — Z85118 Personal history of other malignant neoplasm of bronchus and lung: Secondary | ICD-10-CM

## 2016-07-04 DIAGNOSIS — Z8371 Family history of colonic polyps: Secondary | ICD-10-CM

## 2016-07-04 DIAGNOSIS — Z801 Family history of malignant neoplasm of trachea, bronchus and lung: Secondary | ICD-10-CM

## 2016-07-04 DIAGNOSIS — C4371 Malignant melanoma of right lower limb, including hip: Secondary | ICD-10-CM

## 2016-07-04 DIAGNOSIS — Z86010 Personal history of colonic polyps: Secondary | ICD-10-CM | POA: Diagnosis not present

## 2016-07-05 ENCOUNTER — Encounter: Payer: Self-pay | Admitting: Genetic Counselor

## 2016-07-05 DIAGNOSIS — Z86010 Personal history of colonic polyps: Secondary | ICD-10-CM | POA: Insufficient documentation

## 2016-07-05 NOTE — Progress Notes (Signed)
REFERRING PROVIDER: [place] Cellar, MD  OTHER PROVIDER(S): Lurline Del, MD  PRIMARY PROVIDER:  Ronnie Doss, DO  PRIMARY REASON FOR VISIT:  1. History of colon polyps   2. Adenocarcinoma, colon ([place])   3. 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 History of lung cancer   5. Family history of colonic polyps   6. Family history of lung cancer   7. Family history of throat cancer      HISTORY OF PRESENT ILLNESS:   [name], a 50 y.o. female, was seen for a [facility] cancer genetics consultation at the request of Dr. Havery Moros due to a personal history of colon polyps and colon and other cancers and family history of colon polyps.  [name]. Busenbark presents to clinic today to discuss the possibility of a hereditary predisposition to cancer, genetic testing, and to further clarify her future cancer risks, as well as potential cancer risks for family members.   In 2016, at the age of 30, [name] was diagnosed with adenocarcinoma of the ascending colon. This was treated with surgical resection.  She was also found to have approximately 12-13 additional tubular adenomas of the colon.  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    HORMONAL RISK FACTORS:  Menarche was at age 37.  First live birth at age 57.  OCP use for approximately 12 years.  Ovaries intact: yes.  Hysterectomy: yes, at age 36 for excessive bleeding.  Menopausal status: has had a hysterectomy.  HRT use: 0 years. Colonoscopy: yes; history of colon adenocarcinoma and approx 12-13 other polyps in 2016; is on an annual colonoscopy schedule. Mammogram within the last year: yes. Number of breast biopsies: reports history of a benign breast lumpectomy (fibrocystic breasts). Up to date with pelvic  exams:  n/a. Any excessive radiation exposure/other exposures in the past:  Is exposed to secondhand smoke (boyfriend smokes around her); history of radiation treatment with cancer; worked at [employer] for 8 years when she was younger and she was exposed to a potent glue that was supposed to be used in a well-ventilated area, but the area she worked in was not well-ventilated; reports history of sun-bathing, but says she currently tries to stay out of the sun  Past Medical History:  Diagnosis Date  . 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 Allergic rhinitis   . Anxiety disorder   . Chronic bronchitis   . Colon cancer ([place])    adenocarcinoma in a polyps 11-24-2014  . COPD (chronic obstructive pulmonary disease) ([place])   . Cough   . Dizziness and giddiness   . Dyspepsia   . Emphysema of lung ([place])   . External hemorrhoids   . EXTERNAL HEMORRHOIDS 09/27/2009  . Female stress incontinence   . GERD (gastroesophageal reflux disease)    occ depending on diet   . Goiter   . History of lung cancer   . Hypercholesteremia    denies-last check normal labs  . Idiopathic interstitial pneumonia, not otherwise specified   . Insomnia   . Lung cancer ([place])   . Migraine, unspecified, without mention of intractable migraine without mention of status migrainosus   . Panic attacks   . Seizures ([place])    very long ago like 15 years  ago or so , questionable etilogy  . Skin cancer (melanoma) ([place])    right calf    Past Surgical History:  Procedure Laterality Date  . biopsy of right neck mass     negative-thyroid biopsy   . BRONCHOSCOPY  01/31/2005   nonspecific inflammation  . BRONCHOSCOPY  08/24/2010   nonspecific inflammation  . COLONOSCOPY    . LUNG REMOVAL, PARTIAL     for lung cancer--followed by Dr. Arlyce Dice and Dr. Annamaria Boots  . MELANOMA EXCISION WITH SENTINEL LYMPH NODE BIOPSY Right 11/29/2014   Procedure: RIGHT INGUINAL SENTINEL LYMPH NODE BIOPSY;  Surgeon: Georganna Skeans, MD;  Location: [facility];   Service: General;  Laterality: Right;  . melanoma removal     right calf   . POLYPECTOMY    . portacath removed  10/2007  . SPIROMETRY  07/03/2002   min obstruction,mild restriction 01/31/2005  . tvh  12/01/2000   hysterectomy    Social History   Social History  . Marital status: Widowed    Spouse name: N/A  . Number of children: 1  . Years of education: 10   Occupational History  . Disabled- waitress Unemployed   Social History Main Topics  . Smoking status: Former Smoker    Packs/day: 2.00    Years: 23.00    Types: Cigarettes    Start date: 09/02/1978    Quit date: 10/26/2001  . Smokeless tobacco: Never Used     Comment: Significant passive exposure from boy friend  . Alcohol use No     Comment: history of alcohol abuse 20-30  . Drug use: No     Comment: +MJ  . Sexual activity: Not on file   Other Topics Concern  . Not on file   Social History Narrative   Health Care POA:    Emergency Contact:    End of Life Plan:    Who lives with you: self   Any pets: cat, Bart   Diet: Pt has a varied diet but reports not eating much because she is afraid of weight gain.   Exercise: Pt has no regular exercise routine.   Seatbelts: Pt reports wearing seatbelt when in vehicles.    Hobbies: movies           FAMILY HISTORY:  We obtained a detailed, 4-generation family history.  Significant diagnoses are listed below: Family History  Problem Relation Age of Onset  . Lung cancer Father     smoker and worked at a [employer]  . COPD Mother     not a smoker  . Other Mother     hx of hysterectomy for unspecified reason  . Asthma Daughter   . Diabetes Maternal Grandfather   . Colon polyps Brother     approx 16 polyps on his first colonoscopy  . Other Maternal Aunt     non-cancerous growth in lungs; respiratory issues; smoker  . COPD Maternal Grandmother     d. 25  . Cancer Paternal Grandfather     oral/mouth cancer; chewed tobacco; d. older age  . Throat cancer Other      maternal great aunt (MGF's sister); not a smoker  . Cirrhosis Paternal Uncle     hx of alcohol abuse   . Colon cancer Neg Hx   . Rectal cancer Neg Hx   . Stomach cancer Neg Hx     [name] has one daughter who is 52.  Her daughter has three daughters and one son of her own.  [name] has one full brother who is currently 15 and has never had cancer.  He recently had his first colonoscopy that showed 16 polyps.    [name]'s mother is currently 35 and has never had cancer.  She has COPD and has a history of a hysterectomy for an unspecified reason.  [name]'s mother has one full sister who is currently 61.  [name] reports that this aunt is a smoker, has a non-cancerous growth in her lung, and has respiratory issues.  [name] has two maternal first cousins, and they have never had cancer.  [name] maternal grandmother died of COPD at 34.  Her grandfather died of pneumonia at 72.  He had a sister who died of throat cancer.  [name] has no further information for any other maternal great aunts/uncles and great grandparents.  [name] father was a heavy smoker and he worked in a Estate agent.  He died of lung cancer in his late-50s.  He had five full brothers and one full sister.  His sister and two of his brothers are currently in their 56s and have not had cancer.  Three brothers had a history of alcohol abuse and died from cirrhosis of the liver at later ages.  [name] has limited information for her paternal first cousins, but she does report that one cousin died of an unspecified cause at a young age.  [name] paternal grandparents died at older ages.  Her grandfather chewed tobacco and was diagnosed with mouth/oral cancer.  [name] has no information for her paternal great aunts/uncles and great grandparents.    [name] is unaware of any previous family history of genetic testing for hereditary cancer risks.  Patient's maternal ancestors  are of Caucasian/Irish and Native American/Cherokee descent, and paternal ancestors are of Caucasian descent. There is no reported Ashkenazi Jewish ancestry. There is no known consanguinity.  GENETIC COUNSELING ASSESSMENT: [name] is a 50 y.o. female with a personal and family history of colon polyps and/or cancer which issomewhat suggestive of a hereditary polyposis/colon cancer syndrome and predisposition to cancer. We, therefore, discussed and recommended the following at today's visit.   DISCUSSION: We reviewed the characteristics, features and inheritance patterns of hereditary cancer syndromes, particularly those caused by mutations within the Lynch syndrome, APC, and MUTYH genes. We also discussed genetic testing, including the appropriate family members to test, the process of testing, insurance coverage and turn-around-time for results. We discussed the implications of a negative, positive and/or variant of uncertain significant result. We recommended [name] pursue genetic testing for the 25-gene Custom Cancer Panel with MSH2 Exons 1-7 Inversion Analysis through [facility].  This Custom Panel offered by [facility] includes sequencing and/or duplication/deletion testing of the following 25 genes: APC, ATM, AXIN2, BAP1, BRCA1, BRCA2, BMPR1A, CDK4, CDKN2A, CDH1, CHEK2, EPCAM, MITF, MLH1, MSH2, MSH6, MUTYH, PMS2, POLD1, POLE, PTEN, SCG5/GREM1, SMAD4, STK11, and TP53.    Based on [name]. Remigio's personal and family history of polyps and/or cancer, she meets medical criteria for genetic testing. Despite that she meets criteria, she may still have an out of pocket cost. We discussed that if her out of pocket cost for testing is over $100, the laboratory will call and confirm whether she wants to proceed with testing.  If the out of pocket cost of testing is less than $100 she will be billed by the genetic testing laboratory.   PLAN: After considering the risks, benefits, and limitations,  [name].  [place]ozny  provided informed consent to pursue genetic testing and the blood sample was sent to [facility] for analysis of the 25-gene Custom Panel with MSH2 Exons 1-7 Inversion Analysis. Results should be available within approximately 2-3 weeks' time, at which point they will be disclosed by telephone to [name]. [place]ight, as will any additional recommendations warranted by these results. [name]. Avena will receive a summary of her genetic counseling visit and a copy of her results once available. This information will also be available in Epic. We encouraged [name]. Tison to remain in contact with cancer genetics annually so that we can continuously update the family history and inform her of any changes in cancer genetics and testing that may be of benefit for her family. [name]. Wengert's questions were answered to her satisfaction today. Our contact information was provided should additional questions or concerns arise.  Thank you for the referral and allowing [place] to share in the care of your patient.   Jeanine Luz, [name], [facility] Certified Genetic Counselor [email or web address] Phone: [phone]  The patient was seen for a total of 60 minutes in face-to-face genetic counseling.  This patient was discussed with Drs. Magrinat, Lindi Adie and/or Burr Medico who agrees with the above.    _______________________________________________________________________ For Office Staff:  Number of people involved in session: 1 Was an Intern/ student involved with case: no

## 2016-07-11 ENCOUNTER — Telehealth: Payer: Self-pay | Admitting: Internal Medicine

## 2016-07-11 NOTE — Telephone Encounter (Signed)
Notes Recorded by Deneise Lever, MD on 06/30/2016 at 10:30 AM EDT CXR- stable old scarring with no active process seen -------------------------------------------------------------------- lmtcb x1 for pt.

## 2016-07-11 NOTE — Telephone Encounter (Signed)
Patient called states she is returning Katie's call -pr

## 2016-07-12 NOTE — Telephone Encounter (Signed)
Pt returned call.[name] ° °

## 2016-07-12 NOTE — Telephone Encounter (Signed)
Called spoke with patient Discussed cxr results as stated by CY Pt voiced her understanding and denied any questions/concerns Nothing further needed; will sign off

## 2016-07-24 ENCOUNTER — Telehealth: Payer: Self-pay | Admitting: Genetic Counselor

## 2016-07-24 NOTE — Telephone Encounter (Signed)
Discussed with Ms. [name] that her genetic test result was negative for known pathogenic mutations within any of 25 genes on the Custom Panel through [facility] (19-gene Colorectal Cancer Panel with additional melanoma genes; MSH2 Exons 1-7 inversion also included).  One variant of uncertain significance called "c.7187C>G (p.Thr2396Ser)" was found in one copy of the ATM gene.  Discussed that we treat this like a negative test result until it gets updated by the lab and reviewed why we do that.  Encouraged her to keep her phone number up-to-date with [place], so that we can call her if this gets reclassified in the future.  Discussed that her brother would likely be eligible for genetic testing due to his history of colon polyps, if she is interested.  Ms. [name] should continue to follow her doctors recommendations for cancer/colon polyp screening.  Her daughter can get her first colonoscopy at age 50 due to Ms. [name]'s history of colon cancer.  Ms. [name]'s brother and his children should make their primary doctors aware of the personal and family history, so that they can be screened appropriately.  Also encouraged Ms. [name] to call and update the family history with [place], if anyone else in the family is diagnosed with cancer or >10 colon polyps.  She knows she is welcome to call with any questions.  She would like a copy of her result mailed and I am happy to do that.

## 2016-07-24 NOTE — Telephone Encounter (Signed)
Thank you for your consultation and recommendations.

## 2016-07-29 ENCOUNTER — Ambulatory Visit: Payer: Self-pay | Admitting: Genetic Counselor

## 2016-07-29 DIAGNOSIS — Z809 Family history of malignant neoplasm, unspecified: Secondary | ICD-10-CM

## 2016-07-29 DIAGNOSIS — Z860100 Personal history of colon polyps, unspecified: Secondary | ICD-10-CM

## 2016-07-29 DIAGNOSIS — C3491 Malignant neoplasm of unspecified part of right bronchus or lung: Secondary | ICD-10-CM

## 2016-07-29 DIAGNOSIS — Z1379 Encounter for other screening for genetic and chromosomal anomalies: Secondary | ICD-10-CM

## 2016-07-29 DIAGNOSIS — C189 Malignant neoplasm of colon, unspecified: Secondary | ICD-10-CM

## 2016-07-29 DIAGNOSIS — C4371 Malignant melanoma of right lower limb, including hip: Secondary | ICD-10-CM

## 2016-08-11 ENCOUNTER — Other Ambulatory Visit: Payer: Self-pay | Admitting: Internal Medicine

## 2016-08-11 DIAGNOSIS — J4541 Moderate persistent asthma with (acute) exacerbation: Secondary | ICD-10-CM

## 2016-08-14 ENCOUNTER — Telehealth: Payer: Self-pay | Admitting: Internal Medicine

## 2016-08-14 DIAGNOSIS — J4541 Moderate persistent asthma with (acute) exacerbation: Secondary | ICD-10-CM

## 2016-08-14 MED ORDER — FLUTICASONE FUROATE-VILANTEROL 100-25 MCG/INH IN AEPB: 1.0000 | INHALATION_SPRAY | Freq: Every day | RESPIRATORY_TRACT | 4 refills | Status: DC

## 2016-08-14 NOTE — Telephone Encounter (Signed)
Rx sent to preferred pharmacy. Pt aware & voiced understanding. Nothing further needed.

## 2016-08-16 MED ORDER — FLUTICASONE FUROATE-VILANTEROL 100-25 MCG/INH IN AEPB: 1.0000 | INHALATION_SPRAY | Freq: Every day | RESPIRATORY_TRACT | 6 refills | Status: DC

## 2016-08-16 NOTE — Telephone Encounter (Signed)
Called and spoke with pt and she stated that she did get the rx for the breo at her last OV.  She stated that she turned this in to the pharmacy and now they are saying that they do not have this on file.  She is aware of refill that has been sent in and nothing further is needed.

## 2016-08-16 NOTE — Telephone Encounter (Signed)
Patient stated preferred pharmacy isn't her pharmacy. Pharmacy on file is the correct pharmacy. [facility] [phone] - [place], [address] AT [address].  Patient request a refill of Breo.

## 2016-08-16 NOTE — Addendum Note (Signed)
Addended by: Elie Confer on: 08/16/2016 02:56 PM   Modules accepted: Orders

## 2016-08-18 DIAGNOSIS — Z1379 Encounter for other screening for genetic and chromosomal anomalies: Secondary | ICD-10-CM

## 2016-08-18 HISTORY — DX: Encounter for other screening for genetic and chromosomal anomalies: Z13.79

## 2016-08-18 NOTE — Progress Notes (Signed)
GENETIC TEST RESULT  HPI: [name] was previously seen in the [place] clinic due to a personal history of colon, lung, and melanoma cancers, personal history of colon polyps, family history of cancer, and concerns regarding a hereditary predisposition to cancer. Please refer to our prior cancer genetics clinic note from July 04, 2016 for more information regarding [name]'s medical, social and family histories, and our assessment and recommendations, at the time. [name]'s recent genetic test results were disclosed to her, as were recommendations warranted by these results. These results and recommendations are discussed in more detail below.  GENETIC TEST RESULTS: Genetic testing reported out on July 23, 2016 through the [place] Panel (with MSH2 Exons 1-7 Inversion Analysis) found no known deleterious mutations.  One variant of uncertain significance (VUS) called "c.7187C>G (p.Thr2396Ser)" was found in one copy of the ATM gene.  This Custom Panel offered by [facility] includes sequencing and/or duplication/deletion testing of the following 25 genes: APC, ATM, AXIN2, BAP1, BMPR1A, BRCA1, BRCA2, CDH1, CDK4, CDKN2A, CHEK2, EPCAM, MITF, MLH1, MSH2, MSH6, MUTYH, PMS2, POLD1, POLE, PTEN, SCG5/GREM1, SMAD4, STK11, and TP53.  The test report will be scanned into EPIC and will be located under the Molecular Pathology section of the Results Review tab.   Genetic testing did identify a variant of uncertain significance (VUS) called "c.7187C>G (p.Thr2396Ser)" in one copy of the ATM gene. At this time, it is unknown if this VUS is associated with an increased risk for cancer or if this is a normal finding. Since this VUS result is uncertain, it cannot help guide screening recommendations, and family members should not be tested for this VUS to help define their own cancer risks.  Also, we all have variants within our genes that make [place] unique individuals--most of these  variants are benign.  Thus, we treat this VUS as a negative result until it gets updated by the lab.   With time, we suspect the lab will reclassify this variant and when they do, we will try to re-contact [name] to discuss the reclassification further.  We also encouraged [name] to contact us in a year or two to obtain an update on the status of this VUS.  We discussed with [name] that since the current genetic testing is not perfect, it is possible there may be a gene mutation in one of these genes that current testing cannot detect, but that chance is small. We also discussed, that it is possible that another gene that has not yet been discovered, or that we have not yet tested, is responsible for the cancer diagnoses in the family, and it is, therefore, important to remain in touch with cancer genetics in the future so that we can continue to offer [name] the most up-to-date genetic testing.   CANCER SCREENING RECOMMENDATIONS: While we still do not have an explanation for the personal and family history of cancer/polyps. This result is reassuring and indicates that [name] likely does not have an increased risk for a future cancer due to a mutation in one of these genes. This normal test also suggests that [name]'s cancer was most likely not due to an inherited predisposition associated with one of these genes.  Most cancers happen by chance and this negative test suggests that her cancer falls into this category.  We, therefore, recommended she continue to follow the cancer management and screening guidelines provided by her oncology and primary healthcare providers.   RECOMMENDATIONS FOR  FAMILY MEMBERS: Men and women in this family might be at some increased risk of developing cancer or colon polyps, over the general population risk, simply due to the family history of cancer and polyps. We recommended women in this family have a yearly mammogram beginning at age 51, or 47  years younger than the earliest onset of cancer, an annual clinical breast exam, and perform monthly breast self-exams. Women in this family should also have a gynecological exam as recommended by their primary provider. All family members should have a colonoscopy by age 87.  Close family members, such as [name]'s daughter, should begin getting colonoscopies at age 48 based on her colon cancer diagnosis at 17.    Based on [name]'s family history, we recommended her brother, who was diagnosed with approximately 26 colon polyps, have genetic counseling and testing, if interested. [name] will let us know if we can be of any assistance in coordinating genetic counseling and/or testing for this family member.   FOLLOW-UP: Lastly, we discussed with [name] that cancer genetics is a rapidly advancing field and it is possible that new genetic tests will be appropriate for her and/or her family members in the future. We encouraged her to remain in contact with cancer genetics on an annual basis so we can update her personal and family histories and let her know of advances in cancer genetics that may benefit this family.   Our contact number was provided. [name]'s questions were answered to her satisfaction, and she knows she is welcome to call us at anytime with additional questions or concerns.   Jeanine Luz, MS, [facility] Certified Genetic Counselor [email or web address] Phone: [phone]

## 2016-09-30 ENCOUNTER — Other Ambulatory Visit: Payer: Self-pay | Admitting: Internal Medicine

## 2016-09-30 DIAGNOSIS — J4541 Moderate persistent asthma with (acute) exacerbation: Secondary | ICD-10-CM

## 2016-10-07 ENCOUNTER — Encounter: Payer: Self-pay | Admitting: Gastroenterology

## 2016-10-30 ENCOUNTER — Encounter: Payer: Self-pay | Admitting: Internal Medicine

## 2016-10-30 ENCOUNTER — Ambulatory Visit (INDEPENDENT_AMBULATORY_CARE_PROVIDER_SITE_OTHER): Payer: Medicare Other | Admitting: Internal Medicine

## 2016-10-30 VITALS — BP 122/88 | HR 112 | Ht 65.0 in | Wt 159.8 lb

## 2016-10-30 DIAGNOSIS — J4541 Moderate persistent asthma with (acute) exacerbation: Secondary | ICD-10-CM | POA: Diagnosis not present

## 2016-10-30 DIAGNOSIS — J101 Influenza due to other identified influenza virus with other respiratory manifestations: Secondary | ICD-10-CM | POA: Insufficient documentation

## 2016-10-30 DIAGNOSIS — C3491 Malignant neoplasm of unspecified part of right bronchus or lung: Secondary | ICD-10-CM

## 2016-10-30 DIAGNOSIS — J8482 Adult pulmonary Langerhans cell histiocytosis: Secondary | ICD-10-CM | POA: Diagnosis not present

## 2016-10-30 DIAGNOSIS — R6889 Other general symptoms and signs: Secondary | ICD-10-CM | POA: Diagnosis not present

## 2016-10-30 LAB — POCT INFLUENZA A/B
Influenza A, POC: NEGATIVE
Influenza B, POC: POSITIVE — AB

## 2016-10-30 MED ORDER — PROMETHAZINE-CODEINE 6.25-10 MG/5ML PO SYRP: 5.0000 mL | ORAL_SOLUTION | Freq: Four times a day (QID) | ORAL | 0 refills | Status: DC | PRN

## 2016-10-30 MED ORDER — FLUTICASONE FUROATE-VILANTEROL 100-25 MCG/INH IN AEPB: 1.0000 | INHALATION_SPRAY | Freq: Every day | RESPIRATORY_TRACT | 3 refills | Status: DC

## 2016-10-30 MED ORDER — OSELTAMIVIR PHOSPHATE 75 MG PO CAPS: 75.0000 mg | ORAL_CAPSULE | Freq: Two times a day (BID) | ORAL | 0 refills | Status: DC

## 2016-10-30 MED ORDER — ZOLPIDEM TARTRATE 5 MG PO TABS: ORAL_TABLET | ORAL | 5 refills | Status: DC

## 2016-10-30 MED ORDER — ALBUTEROL SULFATE HFA 108 (90 BASE) MCG/ACT IN AERS: INHALATION_SPRAY | RESPIRATORY_TRACT | 3 refills | Status: DC

## 2016-10-30 NOTE — Assessment & Plan Note (Signed)
No recurrence so far. Being followed by oncology.

## 2016-10-30 NOTE — Patient Instructions (Addendum)
Order- flu swab  Scripts printed for Tamiflu and refills for Breo and ProAir, ambien, and prometh codeine cough syrup  This is probably flu. Stay well-hydrated and rested

## 2016-10-30 NOTE — Progress Notes (Signed)
Patient ID: [name], female    DOB: Feb 20, 1966, 51 y.o.   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 Walk test on room air 06/28/2016-95%, 95%, 95%, 93%, peak heart rate 120/minute. No desaturation after 3185 feet. Office Spirometry 06/28/2016-limited validity due to cough. Restriction of exhaled volume. FVC 1.79/50%, FEV1 1.64/57%, ratio 0.80. 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 Pt would like to get Rx for Ambien-Trazodone not working-unable to sleep. We discussed her last chest CT. No recent imaging. Feels tighter in chest with more wheeze. Using rescue inhaler about twice daily and continues Breo. CT chest 08/01/2015 IMPRESSION: 1. Status post right upper lobectomy. 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 No evidence of local tumor recurrence. 2. No evidence of metastatic disease in the chest. 3. 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 4. Mild-to-moderate centrilobular  emphysema. Electronically Signed   By: Ilona Sorrel M.D.   On: 08/01/2015 14:49 Walk test on room air 06/28/2016-95%, 95%, 95%, 93%, peak heart rate 120/minute. No desaturation after 3185 feet. Office Spirometry 06/28/2016-limited validity due to cough. Restriction of exhaled volume. FVC 1.79/50%, FEV1 1.64/57%, ratio 0.80. FENO- 5 CXR 06/28/2016(today)-stable no interim change IMPRESSION: Postsurgical/postradiation changes with scarring right suprahilar region and apex again noted. No interim change. 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 Sick 2 days with abrupt onset dry cough, temperature 100.3, mild chills, arms ache. No GI upset. Dislikes the taste of Incruse inhaler and prefers Breo. Nasal Flu Swab 10/30/2016-   Positive for Influenza B  Review of Systems-See HPI Constitutional:   No-   weight loss, night sweats, +fevers, chills, fatigue, lassitude. HEENT:   No-  headaches, difficulty swallowing, tooth/dental problems, sore throat,      +mild sneezing,no- itching, ear ache, nasal congestion, post nasal drip,  CV:  No-   chest pain, orthopnea, PND, swelling in lower extremities, anasarca, dizziness, palpitations Resp: No- acute  shortness of breath with exertion or at rest.              productive cough, + non-productive cough,  No- coughing up of blood.               change in color of mucus.  +wheezing.   Skin: No-   rash or lesions. GI:  No-   heartburn, indigestion, abdominal pain, nausea, vomiting,  GU:   MS:   No-   joint pain or  swelling.   Neuro-     nothing unusual Psych:  No- change in mood or affect. No depression or anxiety.  No memory loss.   Objective:   Physical Exam General- Alert, Oriented, Affect-appropriate, Distress+ Appears unwell. Wearing mask Skin- rash-none, lesions- none, excoriation- none.  Lymphadenopathy- none Head- atraumatic            Eyes- Gross vision intact, PERRLA, conjunctivae clear secretions            Ears- Hearing, canals-normal            Nose- , no-Septal dev, mucus, polyps, erosion, perforation             Throat- Mallampati II , mucosa clear , drainage- none, tonsils- atrophic Neck- flexible , trachea midline, no stridor , thyroid nl, carotid no bruit Chest - symmetrical excursion , unlabored           Heart/CV- RRR , no murmur , no gallop  , no rub, nl s1 s2                           - JVD- none , edema- none, stasis changes- none, varices- none           Lung-   Wheeze-none, cough + dry, dullness-none, rub- none. Unlabored at rest           Chest wall-  Abd-  Br/ Gen/ Rectal- Not done, not indicated Extrem- cyanosis- none, clubbing, none, atrophy- none, strength- nl Neuro- grossly intact to observation

## 2016-10-30 NOTE — Assessment & Plan Note (Signed)
Acute exacerbation related to flu syndrome. Plan- Refill Breo (disliked taste of Anoro), prometh codeine cough syrup.

## 2016-10-30 NOTE — Assessment & Plan Note (Signed)
Good history for influenza, confirmed by nasal swab as influenza B flu, Plan-Tamiflu, stay rested and well hydrated. Cough syrup refill as requested.

## 2016-10-30 NOTE — Assessment & Plan Note (Signed)
No symptom and activity identified. We can update imaging as needed.

## 2016-11-08 ENCOUNTER — Telehealth: Payer: Self-pay | Admitting: Internal Medicine

## 2016-11-08 MED ORDER — AMOXICILLIN-POT CLAVULANATE 875-125 MG PO TABS: 1.0000 | ORAL_TABLET | Freq: Two times a day (BID) | ORAL | 0 refills | Status: DC

## 2016-11-08 NOTE — Telephone Encounter (Signed)
Spoke with pt. States that she is not feeling any better since seeing CY on 10/30/16. Reports SOB, severe sinus pressure, headache, chest tightness. Denies coughing, fever or wheezing. When asking pt questions, she would not answer them and then go off on another subject that had nothing to do with the question I asked her. Getting history from the pt was very difficult. Pt would like to have an antibiotic sent in.  CY - please advise. Thanks.  No Known Allergies   Current Outpatient Prescriptions on File Prior to Visit  Medication Sig Dispense Refill  . acetaminophen (TYLENOL) 650 MG CR tablet Take 650 mg by mouth every 8 (eight) hours as needed for pain.    Marland Kitchen albuterol (PROAIR HFA) 108 (90 Base) MCG/ACT inhaler Inhale 2 puffs every 6 hours as needed 3 Inhaler 3  . albuterol (PROVENTIL) (2.5 MG/3ML) 0.083% nebulizer solution Take 3 mLs (2.5 mg total) by nebulization every 6 (six) hours as needed for wheezing or shortness of breath. 75 vial prn  . ALPRAZolam (XANAX) 1 MG tablet Take 1 tablet (1 mg total) by mouth 2 (two) times daily as needed for anxiety. 60 tablet 5  . fluticasone furoate-vilanterol (BREO ELLIPTA) 100-25 MCG/INH AEPB Inhale 1 puff into the lungs daily. Rinse after each use 3 each 3  . oseltamivir (TAMIFLU) 75 MG capsule Take 1 capsule (75 mg total) by mouth 2 (two) times daily. 10 capsule 0  . polyethylene glycol powder (GLYCOLAX/MIRALAX) powder Take 17 g by mouth 2 (two) times daily as needed. 3350 g 1  . PROAIR RESPICLICK 397 (90 Base) MCG/ACT AEPB INHALE 2 PUFFS INTO THE LUNGS EVERY 6 HOURS AS NEEDED. 1 each 4  . promethazine-codeine (PHENERGAN WITH CODEINE) 6.25-10 MG/5ML syrup Take 5 mLs by mouth every 6 (six) hours as needed for cough. 200 mL 0  . topiramate (TOPAMAX) 50 MG tablet TAKE 1 1/2 TABLET BY MOUTH TWICE DAILY. 270 tablet 4  . zolpidem (AMBIEN) 5 MG tablet 1 at bedtime for sleep as needed 30 tablet 5   No current facility-administered medications on file  prior to visit.

## 2016-11-08 NOTE — Telephone Encounter (Signed)
Offer augmentin 875 mg, # 20, 1 twice daily. Let us know next week if no better

## 2016-11-08 NOTE — Telephone Encounter (Signed)
Spoke with pt. She is aware of CY's recommendation. Rx has been sent in. Nothing further was needed.  

## 2016-12-11 ENCOUNTER — Other Ambulatory Visit (HOSPITAL_BASED_OUTPATIENT_CLINIC_OR_DEPARTMENT_OTHER): Payer: Medicare Other

## 2016-12-11 ENCOUNTER — Ambulatory Visit (HOSPITAL_COMMUNITY)
Admission: RE | Admit: 2016-12-11 | Discharge: 2016-12-11 | Disposition: A | Discharge status: Home / Self Care | Payer: Medicare Other | Source: Ambulatory Visit | Attending: Oncology | Admitting: Oncology

## 2016-12-11 ENCOUNTER — Ambulatory Visit (HOSPITAL_BASED_OUTPATIENT_CLINIC_OR_DEPARTMENT_OTHER): Payer: Medicare Other | Admitting: Oncology

## 2016-12-11 ENCOUNTER — Other Ambulatory Visit: Payer: Self-pay | Admitting: Oncology

## 2016-12-11 VITALS — BP 128/84 | HR 110 | Temp 97.8°F | Resp 18 | Ht 65.0 in | Wt 160.3 lb

## 2016-12-11 DIAGNOSIS — Z85820 Personal history of malignant melanoma of skin: Secondary | ICD-10-CM

## 2016-12-11 DIAGNOSIS — Z85118 Personal history of other malignant neoplasm of bronchus and lung: Secondary | ICD-10-CM

## 2016-12-11 DIAGNOSIS — Z85038 Personal history of other malignant neoplasm of large intestine: Secondary | ICD-10-CM | POA: Diagnosis not present

## 2016-12-11 DIAGNOSIS — C4371 Malignant melanoma of right lower limb, including hip: Secondary | ICD-10-CM

## 2016-12-11 DIAGNOSIS — C3491 Malignant neoplasm of unspecified part of right bronchus or lung: Secondary | ICD-10-CM

## 2016-12-11 DIAGNOSIS — R0602 Shortness of breath: Secondary | ICD-10-CM | POA: Diagnosis not present

## 2016-12-11 DIAGNOSIS — C189 Malignant neoplasm of colon, unspecified: Secondary | ICD-10-CM

## 2016-12-11 DIAGNOSIS — Z1231 Encounter for screening mammogram for malignant neoplasm of breast: Secondary | ICD-10-CM

## 2016-12-11 DIAGNOSIS — C182 Malignant neoplasm of ascending colon: Secondary | ICD-10-CM | POA: Diagnosis present

## 2016-12-11 DIAGNOSIS — C3411 Malignant neoplasm of upper lobe, right bronchus or lung: Secondary | ICD-10-CM | POA: Diagnosis not present

## 2016-12-11 LAB — CBC WITH DIFFERENTIAL/PLATELET
BASO%: 0.8 % (ref 0.0–2.0)
Basophils Absolute: 0.1 10*3/uL (ref 0.0–0.1)
EOS%: 1.8 % (ref 0.0–7.0)
Eosinophils Absolute: 0.1 10*3/uL (ref 0.0–0.5)
HCT: 39.1 % (ref 34.8–46.6)
HGB: 13.6 g/dL (ref 11.6–15.9)
LYMPH%: 18.2 % (ref 14.0–49.7)
MCH: 29.2 pg (ref 25.1–34.0)
MCHC: 34.6 g/dL (ref 31.5–36.0)
MCV: 84.4 fL (ref 79.5–101.0)
MONO#: 0.4 10*3/uL (ref 0.1–0.9)
MONO%: 6 % (ref 0.0–14.0)
NEUT#: 5.2 10*3/uL (ref 1.5–6.5)
NEUT%: 73.2 % (ref 38.4–76.8)
Platelets: 221 10*3/uL (ref 145–400)
RBC: 4.64 10*6/uL (ref 3.70–5.45)
RDW: 13.5 % (ref 11.2–14.5)
WBC: 7 10*3/uL (ref 3.9–10.3)
lymph#: 1.3 10*3/uL (ref 0.9–3.3)

## 2016-12-11 LAB — COMPREHENSIVE METABOLIC PANEL
ALT: 9 U/L (ref 0–55)
ANION GAP: 11 meq/L (ref 3–11)
AST: 13 U/L (ref 5–34)
Albumin: 3.9 g/dL (ref 3.5–5.0)
Alkaline Phosphatase: 75 U/L (ref 40–150)
BUN: 8.1 mg/dL (ref 7.0–26.0)
CHLORIDE: 108 meq/L (ref 98–109)
CO2: 22 meq/L (ref 22–29)
CREATININE: 0.8 mg/dL (ref 0.6–1.1)
Calcium: 9 mg/dL (ref 8.4–10.4)
EGFR: 84 mL/min/{1.73_m2} — ABNORMAL LOW (ref >90)
Glucose: 84 mg/dl (ref 70–140)
Potassium: 3.5 mEq/L (ref 3.5–5.1)
SODIUM: 141 meq/L (ref 136–145)
Total Bilirubin: 0.49 mg/dL (ref 0.20–1.20)
Total Protein: 7.5 g/dL (ref 6.4–8.3)

## 2016-12-11 NOTE — Progress Notes (Signed)
[name]  [phone] [phone]     ID: [name] DOB: 02-28-1966  MR#: [id number]  CMK#:[id number]  Patient Care Team: [name], DO as PCP - General Deneise Lever, MD (Pulmonary Disease) Harriett Sine, MD as Consulting Physician (Dermatology) Manus Gunning, MD as Consulting Physician (Gastroenterology) Chauncey Cruel, MD as Consulting Physician (Oncology) PCP: Ronnie Doss, DO GYN: SU:  OTHER MD: Griselda Miner M.D., Rob Hickman M.D., Georganna Skeans MD, Harriett Sine MD  CHIEF COMPLAINT: malignant melanoma, colon carcinoma, lung cancer  CURRENT TREATMENT: Observation   HISTORY OF PRESENT ILLNESS:  From the prior summary:  I know [name] from her remote history of lung carcinoma, which has not recurred. More recently, her significant other [name] noticed a lesion in her right posterior calf. He keeps a picture Atlas of melanoma lesions on his bathroom wall, and told her he was "99% sure" the lesion there would be a melanoma. The patient brought it to Dr. Marjean Donna attention and a punch biopsy was obtained 09/06/2014. This showed ([id number]) malignant melanoma, with a maximum thickness of 0.33 mm, anatomic level III, no ulceration, with negative deep margins but clear peripheral margins. The mitotic index was low, less than one per square millimeter. There was no evidence of lymphovascular invasion. There was brisk tumor infiltrating lymphocytes. Tumor regression was noted across the full aspect of the punch biopsy.   This was read as a pathologic stage TIa, and the patient was referred to Dr. Sarajane Jews for wide excision, performed 09/13/2014. Dr. Everett Graff note indicates a 1 cm margin around the initial biopsy site. The final pathology ([id number]) showed a tumor thickness of 1.15, anatomic level III, with ulceration seen only in the area of prior biopsy, (and therefore read as negative).  Peripheral as well as deep margins were free. Again the mitotic index was low there was no evidence of vascular invasion and tumor infiltrating lymphocytes were brisk. This was read as a pT2a lesion.   The patient is referred for further evaluation and treatment.  INTERVAL HISTORY: [name] returns today for follow-up of her remote lung cancer, and her more recent colon and melanoma cancers, as well as for routine health maintenance. In general she is doing well. She tells me her daughter is going to have another child, her 33th. His son yet tells me she missed an appointment with Dr. Havery Moros. She did not remember when she had her last mammogram. She is up-to-date on her regular dermatologic screening.  REVIEW OF SYSTEMS: [name] has developed pain in the lateral cutaneous nerve upper left thigh. This is very intermittent. It does not keep her from walking. She does feel very short of breath, had "the flu" at some point in the near past, and was treated by Dr. Annamaria Boots she says successfully. The shortness of breath limits her activities. Currently she denies any cough, phlegm production, or pleurisy. A detailed review of systems was otherwise stable.  PAST MEDICAL HISTORY: Past Medical History:  Diagnosis Date  . 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 Allergic rhinitis   . Anxiety disorder   . Chronic bronchitis   . Colon cancer ([place])    adenocarcinoma in a polyps 11-24-2014  . COPD (chronic obstructive pulmonary disease) ([place])   . Cough   . Dizziness and giddiness   . Dyspepsia   . Emphysema of lung ([place])   . External hemorrhoids   . EXTERNAL HEMORRHOIDS 09/27/2009  . Female stress incontinence   . GERD (  gastroesophageal reflux disease)    occ depending on diet   . Goiter   . History of lung cancer   . Hypercholesteremia    denies-last check normal labs  . Idiopathic interstitial pneumonia, not otherwise specified   . Insomnia   . Lung cancer ([place])   . Migraine, unspecified, without  mention of intractable migraine without mention of status migrainosus   . Panic attacks   . Seizures ([place])    very long ago like 15 years ago or so , questionable etilogy  . Skin cancer (melanoma) ([place])    right calf    PAST SURGICAL HISTORY: Past Surgical History:  Procedure Laterality Date  . biopsy of right neck mass     negative-thyroid biopsy   . BRONCHOSCOPY  01/31/2005   nonspecific inflammation  . BRONCHOSCOPY  08/24/2010   nonspecific inflammation  . COLONOSCOPY    . LUNG REMOVAL, PARTIAL     for lung cancer--followed by Dr. Arlyce Dice and Dr. Annamaria Boots  . MELANOMA EXCISION WITH SENTINEL LYMPH NODE BIOPSY Right 11/29/2014   Procedure: RIGHT INGUINAL SENTINEL LYMPH NODE BIOPSY;  Surgeon: Georganna Skeans, MD;  Location: [facility];  Service: General;  Laterality: Right;  . melanoma removal     right calf   . POLYPECTOMY    . portacath removed  10/2007  . SPIROMETRY  07/03/2002   min obstruction,mild restriction 01/31/2005  . tvh  12/01/2000   hysterectomy    FAMILY HISTORY Family History  Problem Relation Age of Onset  . Lung cancer Father     smoker and worked at a Academic librarian  . COPD Mother     not a smoker  . Other Mother     hx of hysterectomy for unspecified reason  . Asthma Daughter   . Diabetes Maternal Grandfather   . Colon polyps Brother     approx 16 polyps on his first colonoscopy  . Other Maternal Aunt     non-cancerous growth in lungs; respiratory issues; smoker  . COPD Maternal Grandmother     d. 32  . Cancer Paternal Grandfather     oral/mouth cancer; chewed tobacco; d. older age  . Throat cancer Other     maternal great aunt (MGF's sister); not a smoker  . Cirrhosis Paternal Uncle     hx of alcohol abuse   . Colon cancer Neg Hx   . Rectal cancer Neg Hx   . Stomach cancer Neg Hx    the patient's father died in his mid 53s from lung cancer. The patient's mother is living, in her 44s. The patient has one brother, no sisters. There is no other history of  cancer in the family to her knowledge  GYNECOLOGIC HISTORY:  No LMP recorded. Patient has had a hysterectomy. Menarche age 29, first live birth age 81. The patient is GX P1. She had a hysterectomy approximately 2001. She did not undergo salpingo-oophorectomy. She did not take hormone replacement  SOCIAL HISTORY:  She is disabled and lives by herself, with a cat. Her significant other, Aaron Edelman, works as a Chief Strategy Officer. Her daughter Theadora Rama, 29 years old as of March 2016, lives in [place] and in addition to working has 4 children. [name]'s husband works for an Tree surgeon, putting up signs    ADVANCED DIRECTIVES: Not in place. The patient intends to name her mother Worthy Flank as healthcare power of attorney. She can be reached at [phone]. At the 11/07/2014 visit patient received the appropriate documents 2 complete and notarize  at her discretion   HEALTH MAINTENANCE: Social History  Substance Use Topics  . Smoking status: Former Smoker    Packs/day: 2.00    Years: 23.00    Types: Cigarettes    Start date: 09/02/1978    Quit date: 10/26/2001  . Smokeless tobacco: Never Used     Comment: Significant passive exposure from boy friend  . Alcohol use No     Comment: history of alcohol abuse 20-30    Mammography: March 2016  Colonoscopy: Due 10/2016  PAP: Status post hysterectomy  Bone density:  Lipid panel:  No Known Allergies  Current Outpatient Prescriptions  Medication Sig Dispense Refill  . acetaminophen (TYLENOL) 650 MG CR tablet Take 650 mg by mouth every 8 (eight) hours as needed for pain.    Marland Kitchen albuterol (PROAIR HFA) 108 (90 Base) MCG/ACT inhaler Inhale 2 puffs every 6 hours as needed 3 Inhaler 3  . albuterol (PROVENTIL) (2.5 MG/3ML) 0.083% nebulizer solution Take 3 mLs (2.5 mg total) by nebulization every 6 (six) hours as needed for wheezing or shortness of breath. 75 vial prn  . ALPRAZolam (XANAX) 1 MG tablet Take 1 tablet (1 mg total) by mouth 2 (two) times daily as  needed for anxiety. 60 tablet 5  . amoxicillin-clavulanate (AUGMENTIN) 875-125 MG tablet Take 1 tablet by mouth 2 (two) times daily. 20 tablet 0  . fluticasone furoate-vilanterol (BREO ELLIPTA) 100-25 MCG/INH AEPB Inhale 1 puff into the lungs daily. Rinse after each use 3 each 3  . oseltamivir (TAMIFLU) 75 MG capsule Take 1 capsule (75 mg total) by mouth 2 (two) times daily. 10 capsule 0  . polyethylene glycol powder (GLYCOLAX/MIRALAX) powder Take 17 g by mouth 2 (two) times daily as needed. 3350 g 1  . PROAIR RESPICLICK 448 (90 Base) MCG/ACT AEPB INHALE 2 PUFFS INTO THE LUNGS EVERY 6 HOURS AS NEEDED. 1 each 4  . promethazine-codeine (PHENERGAN WITH CODEINE) 6.25-10 MG/5ML syrup Take 5 mLs by mouth every 6 (six) hours as needed for cough. 200 mL 0  . topiramate (TOPAMAX) 50 MG tablet TAKE 1 1/2 TABLET BY MOUTH TWICE DAILY. 270 tablet 4  . zolpidem (AMBIEN) 5 MG tablet 1 at bedtime for sleep as needed 30 tablet 5   No current facility-administered medications for this visit.     OBJECTIVE: Middle-aged white woman Who appears stated age  63:   12/11/16 1132  BP: 128/84  Pulse: (!) 110  Resp: 18  Temp: 97.8 F (36.6 C)     Body mass index is 26.68 kg/m.    ECOG FS:1 - Symptomatic but completely ambulatory  Sclerae unicteric, pupils round and equal Oropharynx clear and moist No cervical or supraclavicular adenopathy Lungs no rales or rhonchi Heart regular rate and rhythm Abd soft, nontender, positive bowel sounds MSK no focal spinal tenderness, no upper extremity lymphedema Neuro: nonfocal, well oriented, appropriate affect Breasts: Deferred    LAB RESULTS:  CMP     Component Value Date/Time   NA 138 03/13/2016 1121   K 3.9 03/13/2016 1121   CL 109 11/25/2014 0901   CL 111 (H) 07/09/2012 0958   CO2 26 03/13/2016 1121   GLUCOSE 87 03/13/2016 1121   GLUCOSE 80 07/09/2012 0958   BUN 10.7 03/13/2016 1121   CREATININE 0.8 03/13/2016 1121   CALCIUM 8.8 03/13/2016 1121    PROT 7.4 03/13/2016 1121   ALBUMIN 3.7 03/13/2016 1121   AST 15 03/13/2016 1121   ALT 10 03/13/2016 1121   ALKPHOS 77 03/13/2016  1121   BILITOT 0.66 03/13/2016 1121   GFRNONAA >90 11/25/2014 0901   GFRAA >90 11/25/2014 0901    INo results found for: SPEP, UPEP  Lab Results  Component Value Date   WBC 7.0 12/11/2016   NEUTROABS 5.2 12/11/2016   HGB 13.6 12/11/2016   HCT 39.1 12/11/2016   MCV 84.4 12/11/2016   PLT 221 12/11/2016      Chemistry      Component Value Date/Time   NA 138 03/13/2016 1121   K 3.9 03/13/2016 1121   CL 109 11/25/2014 0901   CL 111 (H) 07/09/2012 0958   CO2 26 03/13/2016 1121   BUN 10.7 03/13/2016 1121   CREATININE 0.8 03/13/2016 1121      Component Value Date/Time   CALCIUM 8.8 03/13/2016 1121   ALKPHOS 77 03/13/2016 1121   AST 15 03/13/2016 1121   ALT 10 03/13/2016 1121   BILITOT 0.66 03/13/2016 1121       Lab Results  Component Value Date   LABCA2 26 07/21/2007    No components found for: [id number]  No results for input(s): INR in the last 168 hours.  Urinalysis    Component Value Date/Time   LABSPEC 1.005 07/16/2012 1207   PHURINE 6.0 07/16/2012 1207   HGBUR Negative 07/16/2012 1207   BILIRUBINUR Negative 07/16/2012 1207   KETONESUR Negative 07/16/2012 1207   PROTEINUR Negative 07/16/2012 1207   NITRITE Negative 07/16/2012 1207   LEUKOCYTESUR Trace 07/16/2012 1207    STUDIES: Dg Chest 2 View  Result Date: 12/11/2016 CLINICAL DATA:  Colon cancer. Non small cell cancer right lung. Melanoma. Short of breath today. EXAM: CHEST  2 VIEW COMPARISON:  06/28/2016 FINDINGS: Right upper lobectomy. Soft tissue fullness around the resection site is unchanged most compatible with scarring and volume loss. No change. Negative for heart failure. No infiltrate effusion or recurrent mass lesion. IMPRESSION: No active cardiopulmonary disease. Electronically Signed   By: Franchot Gallo M.D.   On: 12/11/2016 13:09    ASSESSMENT: 51 y.o.  Superior woman  A: LUNG CANCER  (1) status post right upper lobe wedge resection of a T1 N2, stage IIIA non-small cell lung cancer (adenocarcinoma) measuring 1.8 cm, poorly differentiated, with negative margins, but with 4 positive lymph nodes, 3 N1 and 1 N2;   (a) on [place] 2004 to 2009  (b) no evidence of disease recurrence on most recent chest x-ray 08/19/2014, and likely cured  (2) ashthmatic bronchitis/ COPD  (3) tobacco abuse: The patient quit smoking 2001  B: MALIGNANT MELANOMA  (4) s/p punch biopsy Right posterior calf for malignant melanoma 0.33 mm thickness, level III, w/o ulceration and with a low mitotic index, free deep but positive lateral margins  (5) status post wide excision 09/13/2014 showing a maximum tumor thickness of 1.15 mm, and anatomic level III, with ulceration noted only at the site of prior biopsy, with clear margins( 1 cm per surgical note) with a low mitotic index and brisk infiltration by tumor infiltrating lymphocytes: pT2a NX MX = stage IB  (6) right inguinal sentinel lymph node biopsy 11/29/2014 showed no evidence of melanoma  C: COLON CANCER (7) colonoscopy 11/29/2014 shows adenocarcinoma in  ascending colon polyp; repeat colonoscopy 12/30/2014 removed a tubular adenoma in that area which was negative for malignancy. The area was marked for possible definitive surgery  (a) Repeat colonoscopy with polypectomy 12/30/2014 showed tubular adenomas.  D: GENETICS (8) Genetic testing 07/23/2016 through the [facility] Panel (with MSH2 Exons 1-7 Inversion Analysis) found  no known deleterious mutations.in APC, ATM, AXIN2, BAP1, BMPR1A, BRCA1, BRCA2, CDH1, CDK4, CDKN2A, CHEK2, EPCAM, MITF, MLH1, MSH2, MSH6, MUTYH, PMS2, POLD1, POLE, PTEN, SCG5/GREM1, SMAD4, STK11, and TP53.   (a) One variant of uncertain significance (VUS) called "c.7187C>G (p.Thr2396Ser)" was found in one copy of the ATM gene     PLAN: [name] is now 14 years out from initial  diagnosis of lung cancer with no evidence of disease recurrence. This is very favorable.  She apparently missed an appointment with Dr. Havery Moros and is behind on her surveillance colonoscopy. We have requested to have that appointment scheduled so she does not fall behind.  She is behind on her mammography and I have gone ahead and enter the appropriate orders that she can have that done this coming month.  She continues to have excellent dermatologic follow-up as well. The lesions she showed me today were of concern to her all appear clearly benign.  We discussed her genetics which do not show evidence of a deleterious mutation. She tells me a cousin may have been found to carry a mutation and I have asked her to request his status so we can added to her testing if appropriate.  Otherwise she will return to see [place] in 6 months. She knows to call for any problems that may develop before that visit.  Chauncey Cruel, MD   12/11/2016 11:34 AM Medical Oncology and Hematology [facility] [address] [place], [place] [address] Tel. [phone]    Fax. [phone]

## 2016-12-13 NOTE — Progress Notes (Signed)
Patient is scheduled for pre-visit on 4/18 and colonoscopy on 01/10/17 at [facility].

## 2016-12-16 ENCOUNTER — Ambulatory Visit (INDEPENDENT_AMBULATORY_CARE_PROVIDER_SITE_OTHER): Payer: Medicare Other | Admitting: Family Medicine

## 2016-12-16 ENCOUNTER — Encounter: Payer: Self-pay | Admitting: Family Medicine

## 2016-12-16 VITALS — BP 122/62 | HR 103 | Temp 97.8°F | Ht 65.0 in | Wt 162.0 lb

## 2016-12-16 DIAGNOSIS — F411 Generalized anxiety disorder: Secondary | ICD-10-CM

## 2016-12-16 DIAGNOSIS — M7062 Trochanteric bursitis, left hip: Secondary | ICD-10-CM | POA: Diagnosis not present

## 2016-12-16 DIAGNOSIS — F5101 Primary insomnia: Secondary | ICD-10-CM | POA: Diagnosis not present

## 2016-12-16 DIAGNOSIS — K5900 Constipation, unspecified: Secondary | ICD-10-CM

## 2016-12-16 MED ORDER — POLYETHYLENE GLYCOL 3350 17 GM/SCOOP PO POWD
17.0000 g | Freq: Two times a day (BID) | ORAL | 5 refills | Status: DC | PRN
Start: 2016-12-16 — End: 2018-07-28

## 2016-12-16 MED ORDER — POLYETHYLENE GLYCOL 3350 17 GM/SCOOP PO POWD: 17.0000 g | Freq: Two times a day (BID) | ORAL | 5 refills | Status: DC | PRN

## 2016-12-16 MED ORDER — METHYLPREDNISOLONE ACETATE 40 MG/ML IJ SUSP
40.0000 mg | Freq: Once | INTRAMUSCULAR | Status: AC
  Administered 2016-12-16: 40 mg via INTRAMUSCULAR

## 2016-12-16 MED ORDER — ALPRAZOLAM 1 MG PO TABS: 1.0000 mg | ORAL_TABLET | Freq: Two times a day (BID) | ORAL | 5 refills | Status: DC | PRN

## 2016-12-16 NOTE — Assessment & Plan Note (Addendum)
GAD 7 score 6.  Patient continues to decline SSRI at this time.  Patient stable on current dose of Xanax.  Refilled this medication x6 months.  Patient to follow up in 6 months with new PCP for continued monitoring and refills.

## 2016-12-16 NOTE — Progress Notes (Signed)
    Subjective: CC: anxiety, insomnia HPI: [name] is a 51 y.o. female presenting to clinic today for:  1. Anxiety Patient reports that she is doing well on Xanax.  She reports that symptoms have been well controlled.  No panic attacks.  No excessive sedation.  2. Insomnia Patient reports that she did not see improvement in her sleep with Trazodone.  She saw Dr Annamaria Boots for her COPD 06/28/2016 and he discontinued Trazodone and started Ambien.  She reports that sleep has been improved since starting Ambien.  Denies excessive somnolence, difficulty breathing, apnea during sleep.  3. Lower extremity pain Patient reports left outer thigh pain.  She reports this started about 4 months ago.  She reports that pain is aching.  She denies radiation to groin or involvement of back.  She reports she has taken Motrin with mild relief.  She denies radiation to LE.  She denies weakness, numbness, tingling, preceding injury.  Social Hx reviewed: former smoker. MedHx, medications and allergies reviewed.  Please see EMR. ROS: Per HPI  Objective: Office vital signs reviewed. BP 122/62   Pulse (!) 103   Temp 97.8 F (36.6 C) (Oral)   Ht '5\' 5"'$  (1.651 m)   Wt 162 lb (73.5 kg)   SpO2 99%   BMI 26.96 kg/m   Physical Examination:  General: Awake, alert, well nourished, well appearing, No acute distress Cardio: regular rate and rhythm, S1S2 heard, no murmurs appreciated Pulm: clear to auscultation bilaterally, no wheezes, rhonchi or rales; normal work of breathing on room air MSK: normal gait and normal station; negative FADIR/ FABER bilaterally.  Negative straight leg raise. 5/5 LE strength.  +TTP directly over left greater trochanter Skin: dry; intact; no rashes or lesions Psych: mood stable, speech normal, good eye contact.  GAD 7 : Generalized Anxiety Score 12/16/2016 06/12/2016  Nervous, Anxious, on Edge 0 0  Control/stop worrying 3 0  Worry too much - different things 3 0  Trouble  relaxing 0 3  Restless 0 0  Easily annoyed or irritable 0 0  Afraid - awful might happen 0 1  Total GAD 7 Score 6 4  Anxiety Difficulty Not difficult at all Not difficult at all   INJECTION: Patient was given informed consent, signed copy in the chart. Appropriate time out was taken. Area prepped and draped in usual sterile fashion. 1 cc of methylprednisolone 40 mg/ml plus  3 cc of 1% lidocaine with epinephrine was injected into the left greater trochanteric bursa. The patient tolerated the procedure well. There were no complications. Post procedure instructions were given.  Assessment/ Plan: 51 y.o. female   Insomnia Patient doing well on Ambien that was initiated last October by Dr Annamaria Boots.  Cautioned potential for excessive sedation with concurrent use of Xanax.  Continue good sleep hygiene practices.    Generalized anxiety disorder GAD 7 score 6.  Patient continues to decline SSRI at this time.  Patient stable on current dose of Xanax.  Refilled this medication x6 months.  Patient to follow up in 6 months with new PCP for continued monitoring and refills.  Greater trochanteric bursitis of left hip Steroid injection performed today.  Patient tolerated procedure well.  Return precautions reviewed.  [name], DO PGY-3, [facility]

## 2016-12-16 NOTE — Assessment & Plan Note (Signed)
Patient doing well on Ambien that was initiated last October by Dr Annamaria Boots.  Cautioned potential for excessive sedation with concurrent use of Xanax.  Continue good sleep hygiene practices.

## 2016-12-16 NOTE — Patient Instructions (Addendum)
It was a pleasure seeing you today, [name].  Be cautious when taking Ambien and Xanax.  As we discussed, this can precipitate excessive sedation.  Please make an appointment to see your new Primary Care Doctor in 6 months or sooner if needed.  Please feel free to call our office at [phone] if any questions or concerns arise.  Warm Regards, Jontrell Bushong M. Cherina Dhillon, DO   Hip Bursitis Hip bursitis is inflammation of a fluid-filled sac (bursa) in the hip joint. The bursa protects the bones in the hip joint from rubbing against each other. Hip bursitis can cause mild to moderate pain, and symptoms often come and go over time. What are the causes? This condition may be caused by:  Injury to the hip.  Overuse of the muscles that surround the hip joint.  Arthritis or gout.  Diabetes.  Thyroid disease.  Cold weather.  Infection. In some cases, the cause may not be known. What are the signs or symptoms? Symptoms of this condition may include:  Mild or moderate pain in the hip area. Pain may get worse with movement.  Tenderness and swelling of the hip, especially on the outer side of the hip. Symptoms may come and go. If the bursa becomes infected, you may have the following symptoms:  Fever.  Red skin and a feeling of warmth in the hip area. How is this diagnosed? This condition may be diagnosed based on:  A physical exam.  Your medical history.  X-rays.  Removal of fluid from your inflamed bursa for testing (biopsy). You may be sent to a health care provider who specializes in bone diseases (orthopedist) or a provider who specializes in joint inflammation (rheumatologist). How is this treated? This condition is treated by resting, raising (elevating), and applying pressure(compression) to the injured area. In some cases, this may be enough to make your symptoms go away. Treatment may also include:  Crutches.  Antibiotic medicine.  Draining fluid out of the bursa  to help relieve swelling.  Injecting medicine that helps to reduce inflammation (cortisone). Follow these instructions at home: Medicines   Take over-the-counter and prescription medicines only as told by your health care provider.  Do not drive or operate heavy machinery while taking prescription pain medicine, or as told by your health care provider.  If you were prescribed an antibiotic, take it as told by your health care provider. Do not stop taking the antibiotic even if you start to feel better. Activity   Return to your normal activities as told by your health care provider. Ask your health care provider what activities are safe for you.  Rest and protect your hip as much as possible until your pain and swelling get better. General instructions   Wear compression wraps only as told by your health care provider.  Elevate your hip above the level of your heart as much as you can without pain. To do this, try putting a pillow under your hips while you lie down.  Do not use your hip to support your body weight until your health care provider says that you can. Use crutches as told by your health care provider.  Gently massage and stretch your injured area as often as is comfortable.  Keep all follow-up visits as told by your health care provider. This is important. How is this prevented?  Exercise regularly, as told by your health care provider.  Warm up and stretch before being active.  Cool down and stretch after being  active.  If an activity irritates your hip or causes pain, avoid the activity as much as possible.  Avoid sitting down for long periods at a time. Contact a health care provider if:  You have a fever.  You develop new symptoms.  You have difficulty walking or doing everyday activities.  You have pain that gets worse or does not get better with medicine.  You develop red skin or a feeling of warmth in your hip area. Get help right away if:  You  cannot move your hip.  You have severe pain. This information is not intended to replace advice given to you by your health care provider. Make sure you discuss any questions you have with your health care provider. Document Released: 02/08/2002 Document Revised: 01/25/2016 Document Reviewed: 03/21/2015 Elsevier Interactive Patient Education  2017 [employer].

## 2016-12-18 ENCOUNTER — Ambulatory Visit (AMBULATORY_SURGERY_CENTER): Payer: Self-pay

## 2016-12-18 VITALS — Ht 65.0 in | Wt 156.2 lb

## 2016-12-18 DIAGNOSIS — Z85038 Personal history of other malignant neoplasm of large intestine: Secondary | ICD-10-CM

## 2016-12-18 MED ORDER — SUPREP BOWEL PREP KIT 17.5-3.13-1.6 GM/177ML PO SOLN: 1.0000 | Freq: Once | ORAL | 0 refills | Status: AC

## 2016-12-18 NOTE — Progress Notes (Addendum)
No allergies to eggs or soy No diet meds No home oxygen No past problems with anesthesia  No email, no emmi

## 2016-12-19 ENCOUNTER — Ambulatory Visit: Payer: Medicare Other | Admitting: Family Medicine

## 2017-01-10 ENCOUNTER — Ambulatory Visit (AMBULATORY_SURGERY_CENTER): Payer: Medicare Other | Admitting: Gastroenterology

## 2017-01-10 ENCOUNTER — Encounter: Payer: Self-pay | Admitting: Gastroenterology

## 2017-01-10 VITALS — BP 114/68 | HR 94 | Temp 98.2°F | Resp 16 | Ht 65.0 in | Wt 156.0 lb

## 2017-01-10 DIAGNOSIS — Z86010 Personal history of colonic polyps: Secondary | ICD-10-CM | POA: Diagnosis not present

## 2017-01-10 DIAGNOSIS — Z85038 Personal history of other malignant neoplasm of large intestine: Secondary | ICD-10-CM

## 2017-01-10 MED ORDER — SODIUM CHLORIDE 0.9 % IV SOLN: 500.0000 mL | INTRAVENOUS | Status: DC

## 2017-01-10 NOTE — Progress Notes (Signed)
Pt's states no medical or surgical changes since previsit or office visit. 

## 2017-01-10 NOTE — Op Note (Signed)
[place] Patient Name: Jamil Castillo Procedure Date: 01/10/2017 1:25 PM MRN: [id number] Endoscopist: Remo Lipps P. Mory Herrman MD, MD Age: 51 Referring MD:  Date of Birth: December 04, 1965 Gender: Female Account #: [id number] Procedure:                Colonoscopy Indications:              Personal history of malignant neoplasm of the colon                            with history of multiple adenomas on the last 2                            exams, last colonoscopy one year ago Medicines:                Monitored Anesthesia Care Procedure:                Pre-Anesthesia Assessment:                           - Prior to the procedure, a History and Physical                            was performed, and patient medications and                            allergies were reviewed. The patient's tolerance of                            previous anesthesia was also reviewed. The risks                            and benefits of the procedure and the sedation                            options and risks were discussed with the patient.                            All questions were answered, and informed consent                            was obtained. Prior Anticoagulants: The patient has                            taken no previous anticoagulant or antiplatelet                            agents. ASA Grade Assessment: II - A patient with                            mild systemic disease. After reviewing the risks                            and benefits, the patient was deemed in  satisfactory condition to undergo the procedure.                           After obtaining informed consent, the colonoscope                            was passed under direct vision. Throughout the                            procedure, the patient's blood pressure, pulse, and                            oxygen saturations were monitored continuously. The                            Colonoscope  was introduced through the anus and                            advanced to the the cecum, identified by                            appendiceal orifice and ileocecal valve. The                            colonoscopy was performed without difficulty. The                            patient tolerated the procedure well. The quality                            of the bowel preparation was good. The ileocecal                            valve, appendiceal orifice, and rectum were                            photographed. Scope In: 1:34:24 PM Scope Out: 1:50:59 PM Scope Withdrawal Time: 0 hours 14 minutes 7 seconds  Total Procedure Duration: 0 hours 16 minutes 35 seconds  Findings:                 The perianal and digital rectal examinations were                            normal.                           Internal hemorrhoids were found during                            retroflexion, small to medium in size with scarring                            noted from prior banding.  The exam was otherwise without abnormality. No                            polyps Complications:            No immediate complications. Estimated blood loss:                            None. Estimated Blood Loss:     Estimated blood loss: none. Impression:               - Internal hemorrhoids with scarring noted from                            prior banding                           - The examination was otherwise normal. No polyps Recommendation:           - Patient has a contact number available for                            emergencies. The signs and symptoms of potential                            delayed complications were discussed with the                            patient. Return to normal activities tomorrow.                            Written discharge instructions were provided to the                            patient.                           - Resume previous diet.                            - Continue present medications.                           - Repeat colonoscopy in 3 years for surveillance. Remo Lipps P. Laquasha Groome MD, MD 01/10/2017 1:57:35 PM This report has been signed electronically.

## 2017-01-10 NOTE — Progress Notes (Signed)
A/ox3 pleased with MAC, report to [facility]

## 2017-01-10 NOTE — Patient Instructions (Signed)
Discharge instructions given. Handout on hemorrhoids. Resume previous medications. YOU HAD AN ENDOSCOPIC PROCEDURE TODAY AT THE [facility]:   Refer to the procedure report that was given to you for any specific questions about what was found during the examination.  If the procedure report does not answer your questions, please call your gastroenterologist to clarify.  If you requested that your care partner not be given the details of your procedure findings, then the procedure report has been included in a sealed envelope for you to review at your convenience later.  YOU SHOULD EXPECT: Some feelings of bloating in the abdomen. Passage of more gas than usual.  Walking can help get rid of the air that was put into your GI tract during the procedure and reduce the bloating. If you had a lower endoscopy (such as a colonoscopy or flexible sigmoidoscopy) you may notice spotting of blood in your stool or on the toilet paper. If you underwent a bowel prep for your procedure, you may not have a normal bowel movement for a few days.  Please Note:  You might notice some irritation and congestion in your nose or some drainage.  This is from the oxygen used during your procedure.  There is no need for concern and it should clear up in a day or so.  SYMPTOMS TO REPORT IMMEDIATELY:   Following lower endoscopy (colonoscopy or flexible sigmoidoscopy):  Excessive amounts of blood in the stool  Significant tenderness or worsening of abdominal pains  Swelling of the abdomen that is new, acute  Fever of 100F or higher   For urgent or emergent issues, a gastroenterologist can be reached at any hour by calling [phone].   DIET:  We do recommend a small meal at first, but then you may proceed to your regular diet.  Drink plenty of fluids but you should avoid alcoholic beverages for 24 hours.  ACTIVITY:  You should plan to take it easy for the rest of today and you should NOT DRIVE or use heavy  machinery until tomorrow (because of the sedation medicines used during the test).    FOLLOW UP: Our staff will call the number listed on your records the next business day following your procedure to check on you and address any questions or concerns that you may have regarding the information given to you following your procedure. If we do not reach you, we will leave a message.  However, if you are feeling well and you are not experiencing any problems, there is no need to return our call.  We will assume that you have returned to your regular daily activities without incident.  If any biopsies were taken you will be contacted by phone or by letter within the next 1-3 weeks.  Please call us at [phone] if you have not heard about the biopsies in 3 weeks.    SIGNATURES/CONFIDENTIALITY: You and/or your care partner have signed paperwork which will be entered into your electronic medical record.  These signatures attest to the fact that that the information above on your After Visit Summary has been reviewed and is understood.  Full responsibility of the confidentiality of this discharge information lies with you and/or your care-partner. 

## 2017-01-13 ENCOUNTER — Telehealth: Payer: Self-pay

## 2017-01-13 NOTE — Telephone Encounter (Signed)
  Follow up Call-  Call back number 01/10/2017 11/29/2015 12/30/2014 11/24/2014  Post procedure Call Back phone  # [phone]  Permission to leave phone message Yes Yes Yes Yes  Some recent data might be hidden     Patient questions:  Do you have a fever, pain , or abdominal swelling? No. Pain Score  0 *  Have you tolerated food without any problems? Yes.    Have you been able to return to your normal activities? Yes.    Do you have any questions about your discharge instructions: Diet   No. Medications  No. Follow up visit  No.  Do you have questions or concerns about your Care? No.  Actions: * If pain score is 4 or above: No action needed, pain <4.

## 2017-01-14 ENCOUNTER — Telehealth: Payer: Self-pay

## 2017-01-14 NOTE — Telephone Encounter (Signed)
Pt scheduled for 1st banding on 03/06/17 at 3:45pm

## 2017-01-14 NOTE — Telephone Encounter (Signed)
-----   Message from Manus Gunning, MD sent at 01/10/2017  5:44 PM EDT ----- Regarding: another banding [name] can you help call this patient as well to schedule a routine hemorrhoid banding in clinic with me. Thanks

## 2017-01-21 ENCOUNTER — Ambulatory Visit
Admission: RE | Admit: 2017-01-21 | Discharge: 2017-01-21 | Disposition: A | Discharge status: Home / Self Care | Payer: Medicare Other | Source: Ambulatory Visit | Attending: Oncology | Admitting: Oncology

## 2017-01-21 DIAGNOSIS — C3491 Malignant neoplasm of unspecified part of right bronchus or lung: Secondary | ICD-10-CM

## 2017-01-21 DIAGNOSIS — Z1231 Encounter for screening mammogram for malignant neoplasm of breast: Secondary | ICD-10-CM

## 2017-01-21 DIAGNOSIS — C4371 Malignant melanoma of right lower limb, including hip: Secondary | ICD-10-CM

## 2017-02-14 ENCOUNTER — Encounter (HOSPITAL_COMMUNITY): Payer: Self-pay

## 2017-03-06 ENCOUNTER — Ambulatory Visit (INDEPENDENT_AMBULATORY_CARE_PROVIDER_SITE_OTHER): Payer: Medicare Other | Admitting: Gastroenterology

## 2017-03-06 ENCOUNTER — Encounter: Payer: Self-pay | Admitting: Gastroenterology

## 2017-03-06 VITALS — BP 114/70 | HR 120 | Ht 64.5 in | Wt 157.1 lb

## 2017-03-06 DIAGNOSIS — K649 Unspecified hemorrhoids: Secondary | ICD-10-CM

## 2017-03-06 NOTE — Patient Instructions (Signed)
HEMORRHOID BANDING PROCEDURE    FOLLOW-UP CARE   1. The procedure you have had should have been relatively painless since the banding of the area involved does not have nerve endings and there is no pain sensation.  The rubber band cuts off the blood supply to the hemorrhoid and the band may fall off as soon as 48 hours after the banding (the band may occasionally be seen in the toilet bowl following a bowel movement). You may notice a temporary feeling of fullness in the rectum which should respond adequately to plain Tylenol or Motrin.  2. Following the banding, avoid strenuous exercise that evening and resume full activity the next day.  A sitz bath (soaking in a warm tub) or bidet is soothing, and can be useful for cleansing the area after bowel movements.     3. To avoid constipation, take two tablespoons of natural wheat bran, natural oat bran, flax, Benefiber or any over the counter fiber supplement and increase your water intake to 7-8 glasses daily.    4. Unless you have been prescribed anorectal medication, do not put anything inside your rectum for two weeks: No suppositories, enemas, fingers, etc.  5. Occasionally, you may have more bleeding than usual after the banding procedure.  This is often from the untreated hemorrhoids rather than the treated one.  Don't be concerned if there is a tablespoon or so of blood.  If there is more blood than this, lie flat with your bottom higher than your head and apply an ice pack to the area. If the bleeding does not stop within a half an hour or if you feel faint, call our office at [phone] or go to the emergency room.  6. Problems are not common; however, if there is a substantial amount of bleeding, severe pain, chills, fever or difficulty passing urine (very rare) or other problems, you should call us at [phone] or report to the nearest emergency room.  7. Do not stay seated continuously for more than 2-3 hours for a day or two  after the procedure.  Tighten your buttock muscles 10-15 times every two hours and take 10-15 deep breaths every 1-2 hours.  Do not spend more than a few minutes on the toilet if you cannot empty your bowel; instead re-visit the toilet at a later time.    Please follow up as needed with Dr. Havery Moros.

## 2017-03-06 NOTE — Progress Notes (Signed)
Patient had hemorrhoid banding x 3 last year for symptomatic hemorrhoids. She reports the banding at the time took away her symptoms. She has since had some recurrent bleeding bothering her, discussed this at the time of her last colonoscopy. She requested additional banding    PROCEDURE NOTE: The patient presents with symptomatic grade II  hemorrhoids, requesting rubber band ligation of his/her hemorrhoidal disease.  All risks, benefits and alternative forms of therapy were described and informed consent was obtained.  In the Left Lateral Decubitus position anoscopic examination revealed grade II hemorrhoids in all position(s), largest / most inflamed in RP area.  The anorectum was pre-medicated with 0.125% The decision was made to band the RP internal hemorrhoid, and the [place] was used to perform band ligation without complication.  Digital anorectal examination was then performed to assure proper positioning of the band, and to adjust the banded tissue as required.  The patient was discharged home without pain or other issues.  Dietary and behavioral recommendations were given and along with follow-up instructions.     The following adjunctive treatments were recommended: Increase Miralax to twice daily. If not working to treat constipation, please contact us, would try Linzess in this setting.   The patient will return as needed possible additional banding as required. No complications were encountered and the patient tolerated the procedure well.  [facility] Cellar, MD [facility] Pager [phone]

## 2017-04-30 ENCOUNTER — Encounter (HOSPITAL_COMMUNITY): Payer: Self-pay

## 2017-04-30 ENCOUNTER — Ambulatory Visit (INDEPENDENT_AMBULATORY_CARE_PROVIDER_SITE_OTHER)
Admission: RE | Admit: 2017-04-30 | Discharge: 2017-04-30 | Disposition: A | Discharge status: Home / Self Care | Payer: Medicare Other | Source: Ambulatory Visit | Attending: Internal Medicine | Admitting: Internal Medicine

## 2017-04-30 ENCOUNTER — Ambulatory Visit (INDEPENDENT_AMBULATORY_CARE_PROVIDER_SITE_OTHER): Payer: Medicare Other | Admitting: Internal Medicine

## 2017-04-30 ENCOUNTER — Encounter: Payer: Self-pay | Admitting: Internal Medicine

## 2017-04-30 ENCOUNTER — Emergency Department (HOSPITAL_COMMUNITY): Payer: Medicare Other

## 2017-04-30 ENCOUNTER — Inpatient Hospital Stay (HOSPITAL_COMMUNITY)
Admission: EM | Admit: 2017-04-30 | Discharge: 2017-05-13 | DRG: 215 | Disposition: A | Discharge status: Home / Self Care | Payer: Medicare Other | Attending: Family Medicine | Admitting: Family Medicine

## 2017-04-30 VITALS — BP 114/68 | HR 106 | Ht 65.0 in | Wt 153.0 lb

## 2017-04-30 DIAGNOSIS — M545 Low back pain, unspecified: Secondary | ICD-10-CM

## 2017-04-30 DIAGNOSIS — Z79899 Other long term (current) drug therapy: Secondary | ICD-10-CM | POA: Diagnosis not present

## 2017-04-30 DIAGNOSIS — R0609 Other forms of dyspnea: Secondary | ICD-10-CM | POA: Diagnosis not present

## 2017-04-30 DIAGNOSIS — E7800 Pure hypercholesterolemia, unspecified: Secondary | ICD-10-CM | POA: Diagnosis present

## 2017-04-30 DIAGNOSIS — Z23 Encounter for immunization: Secondary | ICD-10-CM

## 2017-04-30 DIAGNOSIS — Z923 Personal history of irradiation: Secondary | ICD-10-CM

## 2017-04-30 DIAGNOSIS — Z85820 Personal history of malignant melanoma of skin: Secondary | ICD-10-CM | POA: Diagnosis not present

## 2017-04-30 DIAGNOSIS — I248 Other forms of acute ischemic heart disease: Secondary | ICD-10-CM | POA: Diagnosis present

## 2017-04-30 DIAGNOSIS — Z9221 Personal history of antineoplastic chemotherapy: Secondary | ICD-10-CM

## 2017-04-30 DIAGNOSIS — I5023 Acute on chronic systolic (congestive) heart failure: Principal | ICD-10-CM | POA: Diagnosis present

## 2017-04-30 DIAGNOSIS — E876 Hypokalemia: Secondary | ICD-10-CM

## 2017-04-30 DIAGNOSIS — R0602 Shortness of breath: Secondary | ICD-10-CM | POA: Diagnosis present

## 2017-04-30 DIAGNOSIS — Z85038 Personal history of other malignant neoplasm of large intestine: Secondary | ICD-10-CM

## 2017-04-30 DIAGNOSIS — J8482 Adult pulmonary Langerhans cell histiocytosis: Secondary | ICD-10-CM

## 2017-04-30 DIAGNOSIS — R000 Tachycardia, unspecified: Secondary | ICD-10-CM

## 2017-04-30 DIAGNOSIS — I429 Cardiomyopathy, unspecified: Secondary | ICD-10-CM | POA: Diagnosis not present

## 2017-04-30 DIAGNOSIS — F410 Panic disorder [episodic paroxysmal anxiety] without agoraphobia: Secondary | ICD-10-CM | POA: Diagnosis present

## 2017-04-30 DIAGNOSIS — I5021 Acute systolic (congestive) heart failure: Secondary | ICD-10-CM

## 2017-04-30 DIAGNOSIS — J4541 Moderate persistent asthma with (acute) exacerbation: Secondary | ICD-10-CM | POA: Diagnosis not present

## 2017-04-30 DIAGNOSIS — Z90710 Acquired absence of both cervix and uterus: Secondary | ICD-10-CM | POA: Diagnosis not present

## 2017-04-30 DIAGNOSIS — I509 Heart failure, unspecified: Secondary | ICD-10-CM

## 2017-04-30 DIAGNOSIS — Z801 Family history of malignant neoplasm of trachea, bronchus and lung: Secondary | ICD-10-CM | POA: Diagnosis not present

## 2017-04-30 DIAGNOSIS — R0600 Dyspnea, unspecified: Secondary | ICD-10-CM | POA: Diagnosis present

## 2017-04-30 DIAGNOSIS — Z902 Acquired absence of lung [part of]: Secondary | ICD-10-CM | POA: Diagnosis not present

## 2017-04-30 DIAGNOSIS — Z955 Presence of coronary angioplasty implant and graft: Secondary | ICD-10-CM

## 2017-04-30 DIAGNOSIS — F411 Generalized anxiety disorder: Secondary | ICD-10-CM | POA: Diagnosis not present

## 2017-04-30 DIAGNOSIS — G4700 Insomnia, unspecified: Secondary | ICD-10-CM | POA: Diagnosis present

## 2017-04-30 DIAGNOSIS — J449 Chronic obstructive pulmonary disease, unspecified: Secondary | ICD-10-CM

## 2017-04-30 DIAGNOSIS — K5900 Constipation, unspecified: Secondary | ICD-10-CM | POA: Diagnosis not present

## 2017-04-30 DIAGNOSIS — I2510 Atherosclerotic heart disease of native coronary artery without angina pectoris: Secondary | ICD-10-CM

## 2017-04-30 DIAGNOSIS — Z87891 Personal history of nicotine dependence: Secondary | ICD-10-CM | POA: Diagnosis not present

## 2017-04-30 DIAGNOSIS — Z85118 Personal history of other malignant neoplasm of bronchus and lung: Secondary | ICD-10-CM

## 2017-04-30 DIAGNOSIS — I25110 Atherosclerotic heart disease of native coronary artery with unstable angina pectoris: Secondary | ICD-10-CM | POA: Diagnosis not present

## 2017-04-30 DIAGNOSIS — G43909 Migraine, unspecified, not intractable, without status migrainosus: Secondary | ICD-10-CM | POA: Diagnosis present

## 2017-04-30 DIAGNOSIS — R51 Headache: Secondary | ICD-10-CM | POA: Diagnosis not present

## 2017-04-30 DIAGNOSIS — J9601 Acute respiratory failure with hypoxia: Secondary | ICD-10-CM | POA: Diagnosis not present

## 2017-04-30 DIAGNOSIS — I340 Nonrheumatic mitral (valve) insufficiency: Secondary | ICD-10-CM | POA: Diagnosis not present

## 2017-04-30 DIAGNOSIS — I351 Nonrheumatic aortic (valve) insufficiency: Secondary | ICD-10-CM | POA: Diagnosis not present

## 2017-04-30 HISTORY — DX: Dyspnea, unspecified: R06.00

## 2017-04-30 LAB — CBC WITH DIFFERENTIAL/PLATELET
Basophils Absolute: 0 10*3/uL (ref 0.0–0.1)
Basophils Relative: 0 %
EOS ABS: 0 10*3/uL (ref 0.0–0.7)
EOS PCT: 1 %
HCT: 38.9 % (ref 36.0–46.0)
HEMOGLOBIN: 13.1 g/dL (ref 12.0–15.0)
LYMPHS ABS: 1 10*3/uL (ref 0.7–4.0)
LYMPHS PCT: 13 %
MCH: 29.1 pg (ref 26.0–34.0)
MCHC: 33.7 g/dL (ref 30.0–36.0)
MCV: 86.4 fL (ref 78.0–100.0)
MONOS PCT: 6 %
Monocytes Absolute: 0.5 10*3/uL (ref 0.1–1.0)
Neutro Abs: 6 10*3/uL (ref 1.7–7.7)
Neutrophils Relative %: 80 %
PLATELETS: 216 10*3/uL (ref 150–400)
RBC: 4.5 MIL/uL (ref 3.87–5.11)
RDW: 13.5 % (ref 11.5–15.5)
WBC: 7.6 10*3/uL (ref 4.0–10.5)

## 2017-04-30 LAB — BASIC METABOLIC PANEL
Anion gap: 11 (ref 5–15)
BUN: 6 mg/dL (ref 6–20)
CHLORIDE: 105 mmol/L (ref 101–111)
CO2: 22 mmol/L (ref 22–32)
CREATININE: 0.77 mg/dL (ref 0.44–1.00)
Calcium: 9 mg/dL (ref 8.9–10.3)
GFR calc Af Amer: >60 mL/min (ref >60)
GFR calc non Af Amer: >60 mL/min (ref >60)
GLUCOSE: 85 mg/dL (ref 65–99)
POTASSIUM: 3.6 mmol/L (ref 3.5–5.1)
SODIUM: 138 mmol/L (ref 135–145)

## 2017-04-30 LAB — TROPONIN I: Troponin I: 0.04 ng/mL (ref <0.03)

## 2017-04-30 LAB — TSH: TSH: 1.695 u[IU]/mL (ref 0.350–4.500)

## 2017-04-30 LAB — BRAIN NATRIURETIC PEPTIDE: B Natriuretic Peptide: 679.1 pg/mL — ABNORMAL HIGH (ref 0.0–100.0)

## 2017-04-30 LAB — I-STAT TROPONIN, ED: Troponin i, poc: 0 ng/mL (ref 0.00–0.08)

## 2017-04-30 MED ORDER — SODIUM CHLORIDE 0.9% FLUSH: 3.0000 mL | INTRAVENOUS | Status: DC | PRN

## 2017-04-30 MED ORDER — POLYETHYLENE GLYCOL 3350 17 G PO PACK
17.0000 g | PACK | Freq: Every day | ORAL | Status: DC | PRN
  Administered 2017-05-05: 17 g via ORAL
  Filled 2017-04-30: qty 1

## 2017-04-30 MED ORDER — IPRATROPIUM-ALBUTEROL 0.5-2.5 (3) MG/3ML IN SOLN
3.0000 mL | RESPIRATORY_TRACT | Status: DC
  Administered 2017-05-01: 3 mL via RESPIRATORY_TRACT
  Filled 2017-04-30: qty 3

## 2017-04-30 MED ORDER — SODIUM CHLORIDE 0.9 % IV SOLN
250.0000 mL | INTRAVENOUS | Status: DC | PRN
  Administered 2017-05-08: 250 mL via INTRAVENOUS

## 2017-04-30 MED ORDER — FUROSEMIDE 10 MG/ML IJ SOLN
40.0000 mg | Freq: Once | INTRAMUSCULAR | Status: AC
  Administered 2017-04-30: 40 mg via INTRAVENOUS
  Filled 2017-04-30: qty 4

## 2017-04-30 MED ORDER — ACETAMINOPHEN 325 MG PO TABS
650.0000 mg | ORAL_TABLET | Freq: Four times a day (QID) | ORAL | Status: DC | PRN
  Administered 2017-04-30 – 2017-05-12 (×17): 650 mg via ORAL
  Filled 2017-04-30 (×17): qty 2

## 2017-04-30 MED ORDER — ACETAMINOPHEN 650 MG RE SUPP: 650.0000 mg | Freq: Four times a day (QID) | RECTAL | Status: DC | PRN

## 2017-04-30 MED ORDER — ALBUTEROL SULFATE (2.5 MG/3ML) 0.083% IN NEBU
2.5000 mg | INHALATION_SOLUTION | Freq: Four times a day (QID) | RESPIRATORY_TRACT | Status: DC | PRN
  Administered 2017-05-08 – 2017-05-10 (×2): 2.5 mg via RESPIRATORY_TRACT
  Filled 2017-04-30: qty 3

## 2017-04-30 MED ORDER — ENOXAPARIN SODIUM 40 MG/0.4ML ~~LOC~~ SOLN
40.0000 mg | SUBCUTANEOUS | Status: DC
  Administered 2017-05-01 – 2017-05-05 (×5): 40 mg via SUBCUTANEOUS
  Filled 2017-04-30 (×5): qty 0.4

## 2017-04-30 MED ORDER — ALBUTEROL SULFATE HFA 108 (90 BASE) MCG/ACT IN AERS: INHALATION_SPRAY | RESPIRATORY_TRACT | 3 refills | Status: DC

## 2017-04-30 MED ORDER — IOPAMIDOL (ISOVUE-370) INJECTION 76%
80.0000 mL | Freq: Once | INTRAVENOUS | Status: AC | PRN
  Administered 2017-04-30: 80 mL via INTRAVENOUS

## 2017-04-30 MED ORDER — ZOLPIDEM TARTRATE 5 MG PO TABS: ORAL_TABLET | ORAL | 5 refills | Status: DC

## 2017-04-30 MED ORDER — UMECLIDINIUM-VILANTEROL 62.5-25 MCG/INH IN AEPB
1.0000 | INHALATION_SPRAY | Freq: Every day | RESPIRATORY_TRACT | Status: DC
  Administered 2017-05-01 – 2017-05-13 (×13): 1 via RESPIRATORY_TRACT
  Filled 2017-04-30 (×2): qty 14

## 2017-04-30 MED ORDER — ALBUTEROL SULFATE (2.5 MG/3ML) 0.083% IN NEBU: 5.0000 mg | INHALATION_SOLUTION | Freq: Once | RESPIRATORY_TRACT | Status: DC

## 2017-04-30 MED ORDER — UMECLIDINIUM-VILANTEROL 62.5-25 MCG/INH IN AEPB: 1.0000 | INHALATION_SPRAY | Freq: Every day | RESPIRATORY_TRACT | 0 refills | Status: DC

## 2017-04-30 MED ORDER — SODIUM CHLORIDE 0.9% FLUSH
3.0000 mL | Freq: Two times a day (BID) | INTRAVENOUS | Status: DC
  Administered 2017-05-01 – 2017-05-12 (×20): 3 mL via INTRAVENOUS

## 2017-04-30 MED ORDER — CALCIUM CARBONATE ANTACID 500 MG PO CHEW
3.0000 | CHEWABLE_TABLET | Freq: Every day | ORAL | Status: DC | PRN
  Administered 2017-05-03 – 2017-05-13 (×5): 600 mg via ORAL
  Filled 2017-04-30 (×6): qty 3

## 2017-04-30 MED ORDER — ZOLPIDEM TARTRATE 5 MG PO TABS
5.0000 mg | ORAL_TABLET | Freq: Every evening | ORAL | Status: DC | PRN
  Administered 2017-04-30 – 2017-05-02 (×2): 5 mg via ORAL
  Filled 2017-04-30 (×2): qty 1

## 2017-04-30 NOTE — Progress Notes (Signed)
Patient ID: [name], female    DOB: 1966/01/21, 51 y.o.   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 Walk test on room air 06/28/2016-95%, 95%, 95%, 93%, peak heart rate 120/minute. No desaturation after 3185 feet. Office Spirometry 06/28/2016-limited validity due to cough. Restriction of exhaled volume. FVC 1.79/50%, FEV1 1.64/57%, ratio 0.80. 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 Pt would like to get Rx for Ambien-Trazodone not working-unable to sleep. We discussed her last chest CT. No recent imaging. Feels tighter in chest with more wheeze. Using rescue inhaler about twice daily and continues Breo. CT chest 08/01/2015 IMPRESSION: 1. Status post right upper lobectomy. 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 No evidence of local tumor recurrence. 2. No evidence of metastatic disease in the chest. 3. 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 4. Mild-to-moderate centrilobular  emphysema. Electronically Signed   By: Ilona Sorrel M.D.   On: 08/01/2015 14:49 Walk test on room air 06/28/2016-95%, 95%, 95%, 93%, peak heart rate 120/minute. No desaturation after 3185 feet. Office Spirometry 06/28/2016-limited validity due to cough. Restriction of exhaled volume. FVC 1.79/50%, FEV1 1.64/57%, ratio 0.80. FENO- 5 CXR 06/28/2016(today)-stable no interim change IMPRESSION: Postsurgical/postradiation changes with scarring right suprahilar region and apex again noted. No interim change. 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 Sick 2 days with abrupt onset dry cough, temperature 100.3, mild chills, arms ache. No GI upset. Dislikes the taste of Incruse inhaler and prefers Breo. 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 No O2 at home  ProAir, Breo 100, neb albuterol,  Walk Test on room Air- Qualified for home O2-room air resting 92%, desaturated on first left 87%, required 3 L to return to 93% during ambulation. Increased shortness of breath on exertion for 6 weeks with some orthopnea. Denies chest pain, palpitations feet may swell a little at times. She  has an oximeter at home and says saturation often around 86% with any activity. "Really bad snoring" but doesn't know about witnessed apneas. We can come back to this. Using her rescue inhaler several times daily which may explain sustained rapid  heart rate. Denies fever, purulent sputum, adenopathy. CXR 12/11/16 FINDINGS: Right upper lobectomy. Soft tissue fullness around the resection site is unchanged most compatible with scarring and volume loss. No change. Negative for heart failure. No infiltrate effusion or recurrent mass lesion. IMPRESSION: No active cardiopulmonary disease  Review of Systems-+ = pos Constitutional:   No-   weight loss, night sweats, +fevers, chills, fatigue, lassitude. HEENT:   No-  headaches, difficulty swallowing, tooth/dental problems, sore throat,      +mild sneezing,no- itching, ear ache, nasal congestion, post nasal drip,  CV:  No-   chest pain, orthopnea, PND, +swelling in lower extremities, anasarca, dizziness, palpitations Resp: + shortness of breath with exertion or at rest.              productive cough, + non-productive cough,  No- coughing up of blood.               change in color of mucus.  +wheezing.   Skin: No-   rash or lesions. GI:  No-   heartburn, indigestion, abdominal pain, nausea, vomiting,  GU:   MS:  No-   joint pain or swelling.   Neuro-     nothing unusual Psych:  No- change in mood or affect. No depression or anxiety.  No memory loss.   Objective:   Physical Exam General- Alert, Oriented, Affect-appropriate, Distress+minimal On our O2 2L> HR 114, sat 93% Skin- rash-none, lesions- none, excoriation- none.  Lymphadenopathy- none Head- atraumatic            Eyes- Gross vision intact, PERRLA, conjunctivae clear secretions            Ears- Hearing, canals-normal            Nose- , no-Septal dev, mucus, polyps, erosion, perforation             Throat- Mallampati II , mucosa clear , drainage- none, tonsils- atrophic, + hoarse Neck-  flexible , trachea midline, no stridor , thyroid nl, carotid no bruit Chest - symmetrical excursion , unlabored           Heart/CV- RRR rapid , no murmur , no gallop  , no rub, nl s1 s2                           - JVD+2cm , edema- none, stasis changes- none, varices- none           Lung-   Wheeze-none, cough + dry, dullness-none, rub- none.+ Decreased R upper lung           Chest wall-  Abd-  Br/ Gen/ Rectal- Not done, not indicated Extrem- cyanosis- none, clubbing, none, atrophy- none, strength- nl,     negative Homans Neuro- grossly intact to observation

## 2017-04-30 NOTE — ED Provider Notes (Signed)
[facility] DEPT Provider Note   CSN: [id number] Arrival date & time: 04/30/17  1552     History   Chief Complaint No chief complaint on file.   HPI [name] is a 51 y.o. female.  HPI  51 y.o. female with a hx of COPD, presents to the Emergency Department today due to increased shortness of breath and lower extremity edema x 6 weeks. Seen by Pulmonology this AM and sent to ED. CT Angio was performed and negative for PE today. Pt was placed on oxygen 3L [place] with low sats in office. Pt denies chest pain. Does note extreme shortness of breath with exertion. Notes minimal shortness of breath at rest, but present. Does not use oxygen normally. Denies fevers. No congestion. No N/V. No diaphoresis. No numbness/tingling. No headache. Does have hx of lung cancer several years ago with right upper lobectomy that is in remission. No other symptoms noted.     Past Medical History:  Diagnosis Date  . 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 Allergic rhinitis   . Allergy   . Anxiety disorder   . Asthma   . Chronic bronchitis   . Colon cancer ([place])    adenocarcinoma in a polyps 11-24-2014  . COPD (chronic obstructive pulmonary disease) ([place])   . Cough   . Dizziness and giddiness   . Dyspepsia   . Emphysema of lung ([place])   . External hemorrhoids   . EXTERNAL HEMORRHOIDS 09/27/2009  . Female stress incontinence   . GERD (gastroesophageal reflux disease)    occ depending on diet   . Goiter   . History of lung cancer   . Hypercholesteremia    denies-last check normal labs  . 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 Insomnia   . Lung cancer ([place])   . Migraine, unspecified, without mention of intractable migraine without mention of status migrainosus   . Panic attacks   . Seizures ([place])    very long ago like 15 years ago or so , questionable etilogy  . Skin cancer (melanoma) ([place])    right calf    Patient Active Problem List   Diagnosis Date Noted  .  Genetic testing 08/18/2016  . History of colon polyps 07/05/2016  . Constipation 12/01/2014  . Swelling of right lower extremity 12/01/2014  . Adenocarcinoma, colon ([place]) 12/01/2014  . Malignant melanoma of skin of right lower extremity ([place]) 09/06/2014  . Snoring 12/11/2010  . 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 Seasonal and perennial allergic rhinitis 01/26/2010  . Moderate persistent asthmatic bronchitis with exacerbation 02/01/2009  . Generalized anxiety disorder 11/10/2006  . Non-small cell cancer of right lung ([place]) 10/30/2006  . HYPERCHOLESTEROLEMIA 10/30/2006  . PANIC ATTACKS 10/30/2006  . Migraine 10/30/2006  . Insomnia 10/30/2006    Past Surgical History:  Procedure Laterality Date  . biopsy of right neck mass     negative-thyroid biopsy   . BREAST EXCISIONAL BIOPSY Right 1987   no visible scar  . BRONCHOSCOPY  01/31/2005   nonspecific inflammation  . BRONCHOSCOPY  08/24/2010   nonspecific inflammation  . COLONOSCOPY    . LUNG REMOVAL, PARTIAL     for lung cancer--followed by Dr. Arlyce Dice and Dr. Annamaria Boots  . MELANOMA EXCISION WITH SENTINEL LYMPH NODE BIOPSY Right 11/29/2014   Procedure: RIGHT INGUINAL SENTINEL LYMPH NODE BIOPSY;  Surgeon: Georganna Skeans, MD;  Location: [facility];  Service: General;  Laterality: Right;  . melanoma removal     right  calf   . POLYPECTOMY    . portacath removed  10/2007  . SPIROMETRY  07/03/2002   min obstruction,mild restriction 01/31/2005  . tvh  12/01/2000   hysterectomy    OB History    No data available       Home Medications    Prior to Admission medications   Medication Sig Start Date End Date Taking? Authorizing Provider  acetaminophen (TYLENOL) 650 MG CR tablet Take 650 mg by mouth every 8 (eight) hours as needed for pain.    [provider]  albuterol (PROAIR HFA) 108 (90 Base) MCG/ACT inhaler Inhale 2 puffs every 6 hours as needed 04/30/17   Baird Lyons D, MD  albuterol (PROVENTIL) (2.5 MG/3ML)  0.083% nebulizer solution Take 3 mLs (2.5 mg total) by nebulization every 6 (six) hours as needed for wheezing or shortness of breath. 06/28/16   Deneise Lever, MD  ALPRAZolam Duanne Moron) 1 MG tablet Take 1 tablet (1 mg total) by mouth 2 (two) times daily as needed for anxiety. 12/16/16   Ronnie Doss M, DO  fluticasone furoate-vilanterol (BREO ELLIPTA) 100-25 MCG/INH AEPB Inhale 1 puff into the lungs daily. Rinse after each use 10/30/16   Baird Lyons D, MD  polyethylene glycol powder (GLYCOLAX/MIRALAX) powder Take 17 g by mouth 2 (two) times daily as needed. 12/16/16   Gottschalk, Ashly M, DO  PROAIR RESPICLICK 751 (90 Base) MCG/ACT AEPB INHALE 2 PUFFS INTO THE LUNGS EVERY 6 HOURS AS NEEDED. 10/01/16   Young, Clinton D, MD  umeclidinium-vilanterol (ANORO ELLIPTA) 62.5-25 MCG/INH AEPB Inhale 1 puff into the lungs daily. 04/30/17   Deneise Lever, MD  zolpidem Lorrin Mais) 5 MG tablet 1 at bedtime for sleep as needed 04/30/17   Deneise Lever, MD    Family History Family History  Problem Relation Age of Onset  . Lung cancer Father        smoker and worked at a [employer]  . COPD Mother        not a smoker  . Other Mother        hx of hysterectomy for unspecified reason  . Asthma Daughter   . Diabetes Maternal Grandfather   . Colon polyps Brother        approx 16 polyps on his first colonoscopy  . Other Maternal Aunt        non-cancerous growth in lungs; respiratory issues; smoker  . COPD Maternal Grandmother        d. 63  . Cancer Paternal Grandfather        oral/mouth cancer; chewed tobacco; d. older age  . Throat cancer Other        maternal great aunt (MGF's sister); not a smoker  . Cirrhosis Paternal Uncle        hx of alcohol abuse   . Colon cancer Neg Hx   . Rectal cancer Neg Hx   . Stomach cancer Neg Hx     Social History Social History  Substance Use Topics  . Smoking status: Former Smoker    Packs/day: 2.00    Years: 23.00    Types: Cigarettes    Start date:  09/02/1978    Quit date: 10/26/2001  . Smokeless tobacco: Never Used     Comment: Significant passive exposure from boy friend  . Alcohol use No     Comment: history of alcohol abuse 20-30     Allergies   Patient has no known allergies.   Review of Systems Review of  Systems ROS reviewed and all are negative for acute change except as noted in the HPI.  Physical Exam Updated Vital Signs BP (!) 131/98   Pulse (!) 110   Temp 97.9 F (36.6 C) (Oral)   Resp (!) 24   SpO2 98%   Physical Exam  Constitutional: She is oriented to person, place, and time. She appears well-developed and well-nourished. No distress.  HENT:  Head: Normocephalic and atraumatic.  Right Ear: Tympanic membrane, external ear and ear canal normal.  Left Ear: Tympanic membrane, external ear and ear canal normal.  Nose: Nose normal.  Mouth/Throat: Uvula is midline, oropharynx is clear and moist and mucous membranes are normal. No trismus in the jaw. No oropharyngeal exudate, posterior oropharyngeal erythema or tonsillar abscesses.  Eyes: Pupils are equal, round, and reactive to light. EOM are normal.  Neck: Normal range of motion. Neck supple. No tracheal deviation present.  Cardiovascular: Regular rhythm, S1 normal, S2 normal, normal heart sounds, intact distal pulses and normal pulses.  Tachycardia present.   Pulmonary/Chest: Effort normal. No respiratory distress. She has decreased breath sounds in the right lower field and the left lower field. She has no wheezes. She has no rhonchi. She has no rales.  Abdominal: Normal appearance and bowel sounds are normal. There is no tenderness.  Musculoskeletal: Normal range of motion.  Neurological: She is alert and oriented to person, place, and time.  Skin: Skin is warm and dry.  Psychiatric: She has a normal mood and affect. Her speech is normal and behavior is normal. Thought content normal.   ED Treatments / Results  Labs (all labs ordered are listed, but only  abnormal results are displayed) Labs Reviewed  CBC WITH DIFFERENTIAL/PLATELET  BASIC METABOLIC PANEL  BRAIN NATRIURETIC PEPTIDE  I-STAT TROPONIN, ED    EKG  EKG Interpretation None       Radiology Dg Chest 2 View  Result Date: 04/30/2017 CLINICAL DATA:  Shortness of breath. EXAM: CHEST  2 VIEW COMPARISON:  12/11/2016 and chest CT from 04/30/2017 FINDINGS: Previous right lung surgery. Stable postsurgical changes in the right chest and right side of the mediastinum. Patchy interstitial densities in the lower chest, particularly at the left lung base. Heart size is within normal limits and stable. No large pleural effusions. IMPRESSION: Chronic postsurgical changes with slightly prominent interstitial densities in both lungs. Minimal change from the previous examinations but difficult to exclude mild edema particularly based on the recent CT findings. Electronically Signed   By: Markus Daft M.D.   On: 04/30/2017 17:17   Ct Angio Chest W/cm &/or Wo Cm  Result Date: 04/30/2017 CLINICAL DATA:  Worsening shortness of breath and hypoxia for 6 weeks. High pretest probability for pulmonary embolism. Personal history of lung carcinoma. EXAM: CT ANGIOGRAPHY CHEST WITH CONTRAST TECHNIQUE: Multidetector CT imaging of the chest was performed using the standard protocol during bolus administration of intravenous contrast. Multiplanar CT image reconstructions and MIPs were obtained to evaluate the vascular anatomy. CONTRAST:  80 mL Isovue 370 COMPARISON:  08/01/2015 FINDINGS: Cardiovascular: Satisfactory opacification of pulmonary arteries noted, and no pulmonary emboli identified. No evidence of thoracic aortic dissection or aneurysm. Aortic atherosclerosis. Increased cardiomegaly since prior study. Increased reflux of contrast into the IVC and hepatic veins, consistent with right heart insufficiency. No evidence of pericardial effusion. Mediastinum/Nodes: No masses or pathologically enlarged lymph nodes  identified. Lungs/Pleura: Stable post treatment changes seen in the right paramediastinal lung zone. Mild emphysema again noted. No suspicious pulmonary nodules or masses identified.  Increased mild diffuse ground-glass pulmonary opacity is seen bilaterally, suspicious for mild pulmonary edema. No evidence of pulmonary consolidation. Tiny right pleural effusion is new since prior exam. Upper abdomen: No acute findings. Musculoskeletal: No suspicious bone lesions identified. Review of the MIP images confirms the above findings. IMPRESSION: No evidence of pulmonary embolism. Increased cardiomegaly, and reflux of contrast into IVC hepatic veins consistent with right heart insufficiency. Increased symmetric ground-glass pulmonary opacity, likely due to mild diffuse pulmonary edema. New tiny right pleural effusion. Stable posttreatment changes in right hemithorax. No evidence of recurrent or metastatic carcinoma within the thorax. Aortic Atherosclerosis (ICD10-I70.0) and Emphysema (ICD10-J43.9). Electronically Signed   By: Earle Gell M.D.   On: 04/30/2017 15:06    Procedures Procedures (including critical care time) CRITICAL CARE Performed by: Ozella Rocks   Total critical care time: 35 minutes  Critical care time was exclusive of separately billable procedures and treating other patients.  Critical care was necessary to treat or prevent imminent or life-threatening deterioration.  Critical care was time spent personally by me on the following activities: development of treatment plan with patient and/or surrogate as well as nursing, discussions with consultants, evaluation of patient's response to treatment, examination of patient, obtaining history from patient or surrogate, ordering and performing treatments and interventions, ordering and review of laboratory studies, ordering and review of radiographic studies, pulse oximetry and re-evaluation of patient's condition.   Medications Ordered in  ED Medications  furosemide (LASIX) injection 40 mg (40 mg Intravenous Given 04/30/17 1722)     Initial Impression / Assessment and Plan / ED Course  I have reviewed the triage vital signs and the nursing notes.  Pertinent labs & imaging results that were available during my care of the patient were reviewed by me and considered in my medical decision making (see chart for details).  Final Clinical Impressions(s) / ED Diagnoses  {I have reviewed and evaluated the relevant laboratory values. {I have reviewed and evaluated the relevant imaging studies. {I have interpreted the relevant EKG. {I have reviewed the relevant previous healthcare records.  {I obtained HPI from historian. {Patient discussed with supervising physician.  ED Course:  Assessment: Pt is a 51 y.o. female with a hx of COPD, presents to the Emergency Department today due to increased shortness of breath and lower extremity edema x 6 weeks. Seen by Pulmonology this AM and sent to ED. CT Angio was performed and negative for PE today. Pt was placed on oxygen 3L [place] with low sats in office. Pt denies chest pain. Does note extreme shortness of breath with exertion. Notes minimal shortness of breath at rest, but present. Does not use oxygen normally. Denies fevers. No congestion. No N/V. No diaphoresis. No numbness/tingling. No headache. Does have hx of lung cancer several years ago with right upper lobectomy that is in remission. On exam, pt in NAD. Nontoxic/nonseptic appearing. VSS. Afebrile. Lungs CTA. Abdomen nontender soft. EKG with sinus tachycardia. Trop negative. CT Angio PTA showed no PE. Noted cardiomegaly with right heart insufficiency. Pulmonary edema noted with right pleural effusion. BNP pending. Given 40mg  IV Lasix in ED as well as Bipap. Plan is to Admit for new onset CHF.   Disposition/Plan:  Admit Pt acknowledges and agrees with plan  Supervising Physician Carmin Muskrat, MD  Final diagnoses:  Acute congestive heart  failure, unspecified heart failure type [facility])    New Prescriptions New Prescriptions   No medications on file       Shary Decamp, Hershal Coria 04/30/17  8022    Carmin Muskrat, MD 05/02/17 [phone]

## 2017-04-30 NOTE — ED Triage Notes (Signed)
Patient sent from [facility] following 6 weeks of increased shortness of breath and lower extremity edema. Patient placed on oxygen today at 3l in office due to low sats. Had CT chest that shows no PE per patient. No CP, but reports extreme SOB with any exertion. Alert and oriented.

## 2017-04-30 NOTE — H&P (Signed)
[facility] Admission History and Physical Service Pager: [phone]  Patient name: [name] Medical record number: [id number] Date of birth: November 05, 1965 Age: 51 y.o. Gender: female  Primary Care Provider: Bonnita Hollow, MD Consultants: none Code Status: Full  Chief Complaint: shortness of breath  Assessment and Plan: [name] is a 51 y.o. female presenting with shortness of breath. PMH is significant for GAD, migraines, h/o non-small cell lung cancer  Dyspnea: Acute on chronic. Patient has been having SOB for 6 weeks. She described the SOB as some pressure on chest with difficulty breathing. Inhalers help with chest pressure.  Troponin 0 in ED. BNP 679. HR 114. RR 24. O2 sats 96% on 2L [place]. Lungs clear with new O2 requirement. No h/o home O2 use. Remote h/o LE edema though no signs of fluid retention clinically. Wt down 149 lbs from 162 lbs 4 months ago. CTA neg for PE though suspicious for R-heart insufficient with pulm edema. CXR c/w pulm edema. BNP 679. Troponin neg and EKG unremarkable on admission. No signs concerning for ACS. Likely new onset CHF vs COPD exacerbation vs infectious etiology. Pt does have h/o interstitial process thought to be histiocytosis by pulm. Will need to await echo results to help determine etiology. - Admit to telemetry for inpatient, attending Dr. Dorcas Mcmurray  - Vitals per unit - Continuous pulse ox - Incentive spirometry - Trend troponins - obtain ECHO - Repeat EKG in am - TSH, CBC, BMP pending - Supplemental oxygen via [place] for O2 88-94% - Anoro was started today by pulm - Albuterol q6 prn for breakthrough SOB, wheezing - Duoneb q4 - Had flu shot today  COPD: SOB worsening on exertion.  - Continue home meds  Constipation: chronic. - Miralax prn   Generalized anxiety disorder: Uses Xanax BID- when needed. Has a history of panic attacks. - Holding Xanax due to respiratory disterss  H/o Adenocarcinoma,  colon: Adenocarcinoma of colon 2016, removed. No anastomosis, chemo or radiation required. - Monitor   H/o Malignant melanoma of skin of right lower extremity: Had right leg melanoma removed 2016 without signs of dissemination. - Monitor   H/o Non-small cell cancer of right lung: Thoracotomy 1/3 right lobe, chemo 5 years, radiation for 1 year, dx 2003  - Followed closely by Dr. Baird Lyons, Pulmonology  Migraine: Has chronic headaches - Tylenol 650mg  prn for pain   Insomnia: Uses ambien 2-3 times per week, usually takes xanax twice per day - Continue home ambien as required for sleep   FEN/GI: Regular diet Prophylaxis: Levonox  Disposition: Admit to telemetry  History of Present Illness:  [name] is a 51 y.o. female presenting with dyspnea.   Patient has been having SOB for 6 weeks. She described the SOB as some pressure on chest with difficulty breathing. Inhalers help with chest pressure. She has been using her inhaler multiple times throughout day with some relief. She has noticed that she has been getting progressively worse over these few weeks. She endorses getting SOB walking 20 ft now. She's been having orthopnea using 2 pillows at night for these past 6 weeks, as well as noticing feet swelling. She has never experienced this before but has had SOB in the past. Pulse ox at home dropped as low as 85%, improving to 96% at rest.   She did not see the doctor because she has a regular appt with her pulmonologist today. CT Angio was performed while at the office and was  negative for PE, but noted cardiomegaly with right heart insufficiency. Pulmonary edema was noted with right pleural effusion. Patient was placed on oxygen 3L [place] with low sats in office and sent to the ED. While in the ED patient was given 40 mg Lasix IV. Troponin 0 in ED. She was started on BiPAP although she was satting 96% on [place]. She has been tachycardic since admit with HR in the 110's, and tachypneic with RR  around 24. She has a history of non-small cell lung cancer and COPD. She does not have h/o O2 home use, but was prescribed home oxygen today by pulm. She arrived on 3L. Patient admitting for work up of possible new onset CHF exacerbation.  Review Of Systems: Per HPI with the following additions:  Otherwise the remainder of the systems were negative.  Review of Systems  Constitutional: Positive for weight loss.  HENT: Negative for congestion.   Respiratory: Positive for shortness of breath. Negative for cough.   Cardiovascular: Positive for leg swelling. Negative for chest pain.  Gastrointestinal: Positive for constipation. Negative for abdominal pain, diarrhea, nausea and vomiting.  Genitourinary: Negative for dysuria.  Neurological: Negative for headaches.    Patient Active Problem List   Diagnosis Date Noted  . Dyspnea 04/30/2017  . Genetic testing 08/18/2016  . History of colon polyps 07/05/2016  . Constipation 12/01/2014  . Swelling of right lower extremity 12/01/2014  . Adenocarcinoma, colon ([place]) 12/01/2014  . Malignant melanoma of skin of right lower extremity ([place]) 09/06/2014  . Snoring 12/11/2010  . 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 Seasonal and perennial allergic rhinitis 01/26/2010  . Moderate persistent asthmatic bronchitis with exacerbation 02/01/2009  . Generalized anxiety disorder 11/10/2006  . Non-small cell cancer of right lung ([place]) 10/30/2006  . HYPERCHOLESTEROLEMIA 10/30/2006  . PANIC ATTACKS 10/30/2006  . Migraine 10/30/2006  . Insomnia 10/30/2006    Past Medical History: Past Medical History:  Diagnosis Date  . 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 Allergic rhinitis   . Allergy   . Anxiety disorder   . Asthma   . Chronic bronchitis   . Colon cancer ([place])    adenocarcinoma in a polyps 11-24-2014  . COPD (chronic obstructive pulmonary disease) ([place])   . Cough   . Dizziness and giddiness   . Dyspepsia   . Emphysema of lung ([place])    . External hemorrhoids   . EXTERNAL HEMORRHOIDS 09/27/2009  . Female stress incontinence   . GERD (gastroesophageal reflux disease)    occ depending on diet   . Goiter   . History of lung cancer   . Hypercholesteremia    denies-last check normal labs  . 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 Insomnia   . Lung cancer ([place])   . Migraine, unspecified, without mention of intractable migraine without mention of status migrainosus   . Panic attacks   . Seizures ([place])    very long ago like 15 years ago or so , questionable etilogy  . Skin cancer (melanoma) ([place])    right calf    Past Surgical History: Past Surgical History:  Procedure Laterality Date  . biopsy of right neck mass     negative-thyroid biopsy   . BREAST EXCISIONAL BIOPSY Right 1987   no visible scar  . BRONCHOSCOPY  01/31/2005   nonspecific inflammation  . BRONCHOSCOPY  08/24/2010   nonspecific inflammation  . COLONOSCOPY    . LUNG REMOVAL, PARTIAL     for lung  cancer--followed by Dr. Arlyce Dice and Dr. Annamaria Boots  . MELANOMA EXCISION WITH SENTINEL LYMPH NODE BIOPSY Right 11/29/2014   Procedure: RIGHT INGUINAL SENTINEL LYMPH NODE BIOPSY;  Surgeon: Georganna Skeans, MD;  Location: [facility];  Service: General;  Laterality: Right;  . melanoma removal     right calf   . POLYPECTOMY    . portacath removed  10/2007  . SPIROMETRY  07/03/2002   min obstruction,mild restriction 01/31/2005  . tvh  12/01/2000   hysterectomy    Social History: Social History  Substance Use Topics  . Smoking status: Former Smoker    Packs/day: 2.00    Years: 23.00    Types: Cigarettes    Start date: 09/02/1978    Quit date: 10/26/2001  . Smokeless tobacco: Never Used     Comment: Significant passive exposure from boy friend  . Alcohol use No     Comment: history of alcohol abuse 20-30   Additional social history: EtOH none, no illicit drugs Please also refer to relevant sections of EMR.  Family History: Family History   Problem Relation Age of Onset  . Lung cancer Father        smoker and worked at a [employer]  . COPD Mother        not a smoker  . Other Mother        hx of hysterectomy for unspecified reason  . Asthma Daughter   . Diabetes Maternal Grandfather   . Colon polyps Brother        approx 16 polyps on his first colonoscopy  . Other Maternal Aunt        non-cancerous growth in lungs; respiratory issues; smoker  . COPD Maternal Grandmother        d. 2  . Cancer Paternal Grandfather        oral/mouth cancer; chewed tobacco; d. older age  . Throat cancer Other        maternal great aunt (MGF's sister); not a smoker  . Cirrhosis Paternal Uncle        hx of alcohol abuse   . Colon cancer Neg Hx   . Rectal cancer Neg Hx   . Stomach cancer Neg Hx     Allergies and Medications: No Known Allergies No current facility-administered medications on file prior to encounter.    Current Outpatient Prescriptions on File Prior to Encounter  Medication Sig Dispense Refill  . acetaminophen (TYLENOL) 650 MG CR tablet Take 650 mg by mouth every 8 (eight) hours as needed for pain.    Marland Kitchen albuterol (PROAIR HFA) 108 (90 Base) MCG/ACT inhaler Inhale 2 puffs every 6 hours as needed 3 Inhaler 3  . albuterol (PROVENTIL) (2.5 MG/3ML) 0.083% nebulizer solution Take 3 mLs (2.5 mg total) by nebulization every 6 (six) hours as needed for wheezing or shortness of breath. 75 vial prn  . ALPRAZolam (XANAX) 1 MG tablet Take 1 tablet (1 mg total) by mouth 2 (two) times daily as needed for anxiety. 60 tablet 5  . polyethylene glycol powder (GLYCOLAX/MIRALAX) powder Take 17 g by mouth 2 (two) times daily as needed. 225 g 5  . PROAIR RESPICLICK 627 (90 Base) MCG/ACT AEPB INHALE 2 PUFFS INTO THE LUNGS EVERY 6 HOURS AS NEEDED. 1 each 4  . umeclidinium-vilanterol (ANORO ELLIPTA) 62.5-25 MCG/INH AEPB Inhale 1 puff into the lungs daily. 2 each 0  . zolpidem (AMBIEN) 5 MG tablet 1 at bedtime for sleep as needed (Patient  taking differently: Take 5 mg by mouth  at bedtime as needed for sleep. ) 30 tablet 5  . fluticasone furoate-vilanterol (BREO ELLIPTA) 100-25 MCG/INH AEPB Inhale 1 puff into the lungs daily. Rinse after each use (Patient not taking: Reported on 04/30/2017) 3 each 3    Objective: BP (!) 112/91 (BP Location: Left Arm)   Pulse (!) 118   Temp 97.8 F (36.6 C) (Oral)   Resp 20   Ht 5\' 5"  (1.651 m)   Wt 149 lb 3.2 oz (67.7 kg)   SpO2 100%   BMI 24.83 kg/m  Exam: General: NAD, pleasant Eyes: no conjunctival pallor or injection Neck: Supple, no LAD, no JVD Cardiovascular: RRR, no m/r/g, no LE edema Respiratory: CTA BL, patient on BiPAP, speaking in full sentances Gastrointestinal: soft, nontender, nondistended, normoactive BS MSK: moves 4 extremities equally Derm: no rashes appreciated Psych: AOx3, appropriate affect  Labs and Imaging: CBC BMET   Recent Labs Lab 04/30/17 1627  WBC 7.6  HGB 13.1  HCT 38.9  PLT 216    Recent Labs Lab 04/30/17 1627  NA 138  K 3.6  CL 105  CO2 22  BUN 6  CREATININE 0.77  GLUCOSE 85  CALCIUM 9.0    BNP 679 Dg Chest 2 View  Result Date: 04/30/2017 CLINICAL DATA:  Shortness of breath. EXAM: CHEST  2 VIEW COMPARISON:  12/11/2016 and chest CT from 04/30/2017 FINDINGS: Previous right lung surgery. Stable postsurgical changes in the right chest and right side of the mediastinum. Patchy interstitial densities in the lower chest, particularly at the left lung base. Heart size is within normal limits and stable. No large pleural effusions. IMPRESSION: Chronic postsurgical changes with slightly prominent interstitial densities in both lungs. Minimal change from the previous examinations but difficult to exclude mild edema particularly based on the recent CT findings. Electronically Signed   By: Markus Daft M.D.   On: 04/30/2017 17:17   Ct Angio Chest W/cm &/or Wo Cm  Result Date: 04/30/2017 CLINICAL DATA:  Worsening shortness of breath and hypoxia for 6  weeks. High pretest probability for pulmonary embolism. Personal history of lung carcinoma. EXAM: CT ANGIOGRAPHY CHEST WITH CONTRAST TECHNIQUE: Multidetector CT imaging of the chest was performed using the standard protocol during bolus administration of intravenous contrast. Multiplanar CT image reconstructions and MIPs were obtained to evaluate the vascular anatomy. CONTRAST:  80 mL Isovue 370 COMPARISON:  08/01/2015 FINDINGS: Cardiovascular: Satisfactory opacification of pulmonary arteries noted, and no pulmonary emboli identified. No evidence of thoracic aortic dissection or aneurysm. Aortic atherosclerosis. Increased cardiomegaly since prior study. Increased reflux of contrast into the IVC and hepatic veins, consistent with right heart insufficiency. No evidence of pericardial effusion. Mediastinum/Nodes: No masses or pathologically enlarged lymph nodes identified. Lungs/Pleura: Stable post treatment changes seen in the right paramediastinal lung zone. Mild emphysema again noted. No suspicious pulmonary nodules or masses identified. Increased mild diffuse ground-glass pulmonary opacity is seen bilaterally, suspicious for mild pulmonary edema. No evidence of pulmonary consolidation. Tiny right pleural effusion is new since prior exam. Upper abdomen: No acute findings. Musculoskeletal: No suspicious bone lesions identified. Review of the MIP images confirms the above findings. IMPRESSION: No evidence of pulmonary embolism. Increased cardiomegaly, and reflux of contrast into IVC hepatic veins consistent with right heart insufficiency. Increased symmetric ground-glass pulmonary opacity, likely due to mild diffuse pulmonary edema. New tiny right pleural effusion. Stable posttreatment changes in right hemithorax. No evidence of recurrent or metastatic carcinoma within the thorax. Aortic Atherosclerosis (ICD10-I70.0) and Emphysema (ICD10-J43.9). Electronically Signed   By: Jenny Reichmann  Kris Hartmann M.D.   On: 04/30/2017 15:06    Shirley, [place], DO 04/30/2017, 10:44 PM PGY-1, [facility] pager: [phone], text pages welcome  Upper Level Addendum:  I have seen and evaluated this patient along with Dr. Enid Derry and reviewed the above note, making necessary revisions in blue.  Harriet [place], [place], PGY-2

## 2017-04-30 NOTE — Patient Instructions (Addendum)
Flu vax  Order- CTa chest with contrast   PE protocol      Dx Acute respiratory failure with hypoxia  Order- schedule PFT  Order- schedule Echocardiogram    dx dyspnea on exertion, rapid heart beat  Sample X 2 Anoro Ellipta    Inhale 1 puff, once daily     Try this instead of Breo as daily maintenance  Order- new DME, new O2 2L sleep, 3L exertion, add humidifier    Dx Acute respiratory failure with hypoxia, COPD mixed type  Please call as needed  HC parking form done

## 2017-05-01 ENCOUNTER — Telehealth: Payer: Self-pay | Admitting: Internal Medicine

## 2017-05-01 DIAGNOSIS — I509 Heart failure, unspecified: Secondary | ICD-10-CM

## 2017-05-01 DIAGNOSIS — J9621 Acute and chronic respiratory failure with hypoxia: Secondary | ICD-10-CM | POA: Insufficient documentation

## 2017-05-01 DIAGNOSIS — J9601 Acute respiratory failure with hypoxia: Secondary | ICD-10-CM

## 2017-05-01 DIAGNOSIS — E876 Hypokalemia: Secondary | ICD-10-CM

## 2017-05-01 HISTORY — DX: Acute respiratory failure with hypoxia: J96.01

## 2017-05-01 LAB — BASIC METABOLIC PANEL
Anion gap: 9 (ref 5–15)
BUN: 6 mg/dL (ref 6–20)
CHLORIDE: 103 mmol/L (ref 101–111)
CO2: 27 mmol/L (ref 22–32)
CREATININE: 0.78 mg/dL (ref 0.44–1.00)
Calcium: 9 mg/dL (ref 8.9–10.3)
GFR calc Af Amer: >60 mL/min (ref >60)
GFR calc non Af Amer: >60 mL/min (ref >60)
Glucose, Bld: 92 mg/dL (ref 65–99)
Potassium: 3.1 mmol/L — ABNORMAL LOW (ref 3.5–5.1)
SODIUM: 139 mmol/L (ref 135–145)

## 2017-05-01 LAB — CBC
HCT: 40.2 % (ref 36.0–46.0)
Hemoglobin: 13.6 g/dL (ref 12.0–15.0)
MCH: 28.8 pg (ref 26.0–34.0)
MCHC: 33.8 g/dL (ref 30.0–36.0)
MCV: 85.2 fL (ref 78.0–100.0)
PLATELETS: 200 10*3/uL (ref 150–400)
RBC: 4.72 MIL/uL (ref 3.87–5.11)
RDW: 13.3 % (ref 11.5–15.5)
WBC: 7.2 10*3/uL (ref 4.0–10.5)

## 2017-05-01 LAB — BRAIN NATRIURETIC PEPTIDE: B Natriuretic Peptide: 656.4 pg/mL — ABNORMAL HIGH (ref 0.0–100.0)

## 2017-05-01 LAB — TROPONIN I
Troponin I: 0.03 ng/mL (ref <0.03)
Troponin I: <0.03 ng/mL (ref <0.03)

## 2017-05-01 LAB — HIV ANTIBODY (ROUTINE TESTING W REFLEX): HIV SCREEN 4TH GENERATION: NONREACTIVE

## 2017-05-01 MED ORDER — IPRATROPIUM-ALBUTEROL 0.5-2.5 (3) MG/3ML IN SOLN
3.0000 mL | Freq: Three times a day (TID) | RESPIRATORY_TRACT | Status: DC
  Administered 2017-05-01 – 2017-05-02 (×4): 3 mL via RESPIRATORY_TRACT
  Filled 2017-05-01 (×4): qty 3

## 2017-05-01 MED ORDER — ALPRAZOLAM 0.5 MG PO TABS
1.0000 mg | ORAL_TABLET | Freq: Two times a day (BID) | ORAL | Status: DC | PRN
  Administered 2017-05-01 – 2017-05-13 (×22): 1 mg via ORAL
  Filled 2017-05-01 (×22): qty 2

## 2017-05-01 MED ORDER — POTASSIUM CHLORIDE CRYS ER 20 MEQ PO TBCR
30.0000 meq | EXTENDED_RELEASE_TABLET | Freq: Two times a day (BID) | ORAL | Status: DC
  Administered 2017-05-01: 30 meq via ORAL
  Filled 2017-05-01: qty 1

## 2017-05-01 MED ORDER — POTASSIUM CHLORIDE CRYS ER 20 MEQ PO TBCR
30.0000 meq | EXTENDED_RELEASE_TABLET | Freq: Two times a day (BID) | ORAL | Status: DC
  Administered 2017-05-02: 30 meq via ORAL
  Filled 2017-05-01: qty 1

## 2017-05-01 NOTE — Assessment & Plan Note (Signed)
Underlying severe COPD, but acute hypoxic process is new in the last 6 weeks. CXR in April negative for CHF. Without fever or purulent sputum, pneumonia less likely although aspiration could do this. First concern is PE can't rule out CHF-perhaps a cardiomyopathy. Plan-starting home oxygen. CT with contrast to rule out PE or other parenchymal process. Echocardiogram. Handicapped parking. If she gets worse go to ER.

## 2017-05-01 NOTE — Progress Notes (Signed)
Wt Readings from Last 10 Encounters:  05/01/17 149 lb 1.6 oz (67.6 kg)  04/30/17 153 lb (69.4 kg)  03/06/17 157 lb 2 oz (71.3 kg)  01/10/17 156 lb (70.8 kg)  12/18/16 156 lb 3.2 oz (70.9 kg)  12/16/16 162 lb (73.5 kg)  12/11/16 160 lb 4.8 oz (72.7 kg)  10/30/16 159 lb 12.8 oz (72.5 kg)  06/28/16 152 lb 6.4 oz (69.1 kg)  06/12/16 150 lb 3.2 oz (68.1 kg)

## 2017-05-01 NOTE — Telephone Encounter (Signed)
I returned call, reaching resident on call. I reviewed patient's recent clinical background and my office visit of 8/29. Appears to have CHF uncertain etiology. She does have newly ordered home oxygen through our office and return appointment scheduled in 6 weeks.

## 2017-05-01 NOTE — Assessment & Plan Note (Signed)
Finally resolved in 2016

## 2017-05-01 NOTE — Telephone Encounter (Signed)
No callback number or pager number listed for this service. Attempted to google this service and was unable to find a service by the documented name.   Pt is currently admitted in hospital, was seen yesterday by CY.    CY please advise if you're familiar with this service- perhaps a hospital service?  Thanks.

## 2017-05-01 NOTE — Discharge Summary (Signed)
[facility] Discharge Summary  Patient name: [name] Medical record number: [id number] Date of birth: 12/29/1965 Age: 51 y.o. Gender: female Date of Admission: 04/30/2017  Date of Discharge: 05/13/2017 Admitting Physician: Dickie La, MD  Primary Care Provider: Rory Percy, DO Consultants: Cardiology, Heart Failure Team, Cardiovascular and Thoracic Surgery  Indication for Hospitalization: Dyspnea  Discharge Diagnoses/Problem List:  New onset CHF New onset CAD COPD Hypokalemia GAD H/o colon adenocarcinoma H/o Malignant melanoma of right lower extremity H/o Non-small cell cancer of right lung, s/p thoracotomy, chemotherapy, radiation Migraine Insomnia  Disposition: Home  Discharge Condition: Stable  Discharge Exam:  General: well developed, well nourished female, in no acute distress Cardiovascular: RRR, no m/r/g Respiratory: CTAB, no wheezes/rales Abdomen: soft, non-tender, BS+ Extremities: warm, dry, trace LE edema, dorsal pedal pulses intact. Slight bruising to R groin, decreased from yesterday.  Brief Hospital Course:  [name] is a 51 y.o. female with PMH significant for COPD who presented from her pulmonology office with acute on chronic dyspnea and LE edema for the past 6 weeks. On admission, she was normotensive, tachycardic 118, tachypnec 24 and was started on BIPAP due to low saturations on O2 at her pulmonology office appointment earlier that day.  BNP was elevated to 679.1 on admission, but trended down to 656.4. CXR consistent with pulmonary edema. CT chest was negative for PE, but showed cardiomegaly and reflux of contrast into IVC hepatic veins consistent with right heart insufficiency. This was concerning for potential CHF with right heart strain. Troponins were slightly elevated at 0.04 but down trended to normal levels and noted to be likely due to stress placed on heart from CHF and not infarction as her EKG did  not show any ST elevation. Patient's respiratory status improved significantly after diuresis and was able to be weaned off of O2 with saturations in the mid 90s. ECHO revealed reduced EF of 20-25% and diffuse hypokinesis. Cardiology was consulted and performed L/R catheterization on 9/4 which revealed significant left main disease 60% stenosed and proximal RCA 15% stenosed.  She was felt to not be a candidate for CABG given her severe COPD seen on PFTs.  She then underwent LM angioplasty and stenting on 9/10. BPs remained low throughout admission following diuresis with SBP ranging mid 80s to low 100s. She tolerated ambulation with Cardiac Rehab well. She was started on cozaar, digoxin, carvedilol, spironolactone, Plavix, Lasix PRN per Cardiology.  She tolerated the stenting procedure well and is being discharged home with new medications listed above and close follow up with PCP, Cardiology, and Pulmonology.  Issues for Follow Up:  1. Ensure continued resolution of symptoms. 2. Ensure toleration and compliance of new medications: Plavix, Spironolactone, Coreg, Cozaar. 3. Patient can take Lasix 40mg  MWF as needed for fluid overload (LE edema, weight gain>2lbs) per Cardiology. Instructed to take daily weights. 4. Ensure adequate follow up with Cardiology and Pulmonology. 5. Consider weaning Xanax as patient reports taking twice daily.  Significant Procedures:  L/R Heart Catheterization with LM angioplasty and stent placement 05/12/17.  Significant Labs and Imaging:   Recent Labs Lab 05/11/17 0557 05/12/17 0433 05/13/17 0143  WBC 5.7 5.3 6.7  HGB 13.3 12.8 12.0  HCT 40.0 37.9 35.3*  PLT 190 180 181    Recent Labs Lab 05/09/17 0453 05/10/17 0308 05/11/17 0557 05/12/17 0433 05/13/17 0143  NA 137 137 136 138 135  K 3.7 4.0 3.8 4.2 3.8  CL 105 105 103 104 104  CO2  25 25 23 28 26   GLUCOSE 93 92 123* 95 109*  BUN 10 8 6 11 12   CREATININE 0.69 0.79 0.73 0.75 0.66  CALCIUM 9.3 9.1 9.2  9.1 8.9  MG 1.8  --   --   --   --     Coronary Stent Intervention 09/10 - A STENT SIERRA 4.00 X 15 MM drug eluting stent was successfully placed. - Ost LM lesion, 70 %stenosed. - Post intervention, there is a 0% residual stenosis.  Right/Left Heart Cath 09/04 - Ost LM to LM lesion, 60 %stenosed. - Prox RCA lesion, 15 %stenosed. - LV end diastolic pressure is normal. - Left subclavian injection showed patent LIMA. - CVTS consult for significant LM disease.  ECHO 08/31 - Left ventricle: LVEF is severely depressed at approximately 20 to   25% with diffuse hypokinesis; inferior, inferoseptal akinesis.   The cavity size was mildly dilated. Wall thickness was normal.   Systolic function was severely reduced. The estimated ejection   fraction was in the range of 20% to 25%. - Aortic valve: There was mild to moderate regurgitation. - Mitral valve: There was mild regurgitation. - Right ventricle: The cavity size was mildly dilated. Systolic   function was moderately reduced. - Right atrium: The atrium was mildly dilated.  Chest Xray 8/29 FINDINGS: Previous right lung surgery. Stable postsurgical changes in the right chest and right side of the mediastinum. Patchy interstitial densities in the lower chest, particularly at the left lung base. Heart size is within normal limits and stable. No large pleural effusions. IMPRESSION: Chronic postsurgical changes with slightly prominent interstitial densities in both lungs. Minimal change from the previous examinations but difficult to exclude mild edema particularly based on the recent CT findings.   CTA Chest 8/29 IMPRESSION: No evidence of pulmonary embolism. Increased cardiomegaly, and reflux of contrast into IVC hepatic veins consistent with right heart insufficiency. Increased symmetric ground-glass pulmonary opacity, likely due to mild diffuse pulmonary edema. New tiny right pleural effusion. Stable posttreatment changes in right hemithorax. No  evidence of recurrent or metastatic carcinoma within the thorax.    Results/Tests Pending at Time of Discharge: None  Discharge Medications:  Allergies as of 05/13/2017   No Known Allergies     Medication List    STOP taking these medications   acetaminophen 650 MG CR tablet Commonly known as:  TYLENOL   fluticasone furoate-vilanterol 100-25 MCG/INH Aepb Commonly known as:  BREO ELLIPTA     TAKE these medications   ALPRAZolam 1 MG tablet Commonly known as:  XANAX Take 1 tablet (1 mg total) by mouth 2 (two) times daily as needed for anxiety.   aspirin 81 MG EC tablet Take 1 tablet (81 mg total) by mouth daily.   atorvastatin 40 MG tablet Commonly known as:  LIPITOR Take 1 tablet (40 mg total) by mouth daily at 6 PM.   carvedilol 3.125 MG tablet Commonly known as:  COREG Take 1 tablet (3.125 mg total) by mouth 2 (two) times daily with a meal.   clopidogrel 75 MG tablet Commonly known as:  PLAVIX Take 1 tablet (75 mg total) by mouth daily.   digoxin 0.125 MG tablet Commonly known as:  LANOXIN Take 1 tablet (0.125 mg total) by mouth daily.   furosemide 40 MG tablet Commonly known as:  LASIX Take 1 tablet (40 mg total) by mouth daily. Take one tablet (40mg  total) by mouth daily as needed for increased swelling or weight gain >2lb   ipratropium-albuterol 0.5-2.5 (  3) MG/3ML Soln Commonly known as:  DUONEB Take 3 mLs by nebulization every 6 (six) hours as needed.   losartan 25 MG tablet Commonly known as:  COZAAR Take 1 tablet (25 mg total) by mouth daily.   MAALOX PO Take 1 tablet by mouth as needed (heartburn).   polyethylene glycol powder powder Commonly known as:  GLYCOLAX/MIRALAX Take 17 g by mouth 2 (two) times daily as needed.   PROAIR RESPICLICK 124 (90 Base) MCG/ACT Aepb Generic drug:  Albuterol Sulfate INHALE 2 PUFFS INTO THE LUNGS EVERY 6 HOURS AS NEEDED. What changed:  Another medication with the same name was removed. Continue taking this  medication, and follow the directions you see here.   spironolactone 25 MG tablet Commonly known as:  ALDACTONE Take 0.5 tablets (12.5 mg total) by mouth daily.   umeclidinium-vilanterol 62.5-25 MCG/INH Aepb Commonly known as:  ANORO ELLIPTA Inhale 1 puff into the lungs daily.   zolpidem 5 MG tablet Commonly known as:  AMBIEN 1 at bedtime for sleep as needed What changed:  how much to take  how to take this  when to take this  reasons to take this  additional instructions            Discharge Care Instructions        Start     Ordered   05/14/17 0000  furosemide (LASIX) 40 MG tablet  Daily     05/13/17 1053   05/13/17 0000  atorvastatin (LIPITOR) 40 MG tablet  Daily-1800     05/13/17 0934   05/13/17 0000  aspirin 81 MG EC tablet  Daily     05/13/17 0934   05/13/17 0000  carvedilol (COREG) 3.125 MG tablet  2 times daily with meals     05/13/17 0934   05/13/17 0000  clopidogrel (PLAVIX) 75 MG tablet  Daily     05/13/17 0934   05/13/17 0000  digoxin (LANOXIN) 0.125 MG tablet  Daily     05/13/17 0934   05/13/17 0000  losartan (COZAAR) 25 MG tablet  Daily     05/13/17 0934   05/13/17 0000  spironolactone (ALDACTONE) 25 MG tablet  Daily     05/13/17 0934   05/13/17 0000  Amb Referral to Cardiac Rehabilitation    Question Answer Comment  Diagnosis: Coronary Stents   Diagnosis: PTCA      05/13/17 0929   05/13/17 0000  ipratropium-albuterol (DUONEB) 0.5-2.5 (3) MG/3ML SOLN  Every 6 hours PRN     05/13/17 1111      Discharge Instructions: Please refer to Patient Instructions section of EMR for full details.  Patient was counseled important signs and symptoms that should prompt return to medical care, changes in medications, dietary instructions, activity restrictions, and follow up appointments.   Follow-Up Appointments: Follow-up Information    McDiarmid, Blane Ohara, MD Follow up on 05/15/2017.   Specialty:  Family Medicine Why:  Follow up at [facility] with Dr.  McDiarmid on 05/15/17 at 10:30am. Please arrive 15 minutes prior to appointment time. Contact information: [place] [address] [phone]        [name], MD. Schedule an appointment as soon as possible for a visit in 1 week(s).   Specialty:  Cardiology Contact information: [place] [address] [phone]           Rory Percy, DO 05/13/2017, 11:12 AM PGY-1, [place]

## 2017-05-01 NOTE — Progress Notes (Signed)
NURSING PROGRESS NOTE  [name] [id number] Admission Data: 05/01/2017 6:07 AM Attending Provider: Dickie La, MD OZH:[id number], Corena Pilgrim, MD Code Status: Full  [name] is a 51 y.o. female patient admitted from ED:  -No acute distress noted.  -No complaints of shortness of breath.  -No complaints of chest pain.   Cardiac Monitoring: Box # 20 in place. Cardiac monitor yields:sinus tachycardia.  Blood pressure 107/64, pulse (!) 101, temperature 98.2 F (36.8 C), temperature source Oral, resp. rate 18, height 5\' 5"  (1.651 m), weight 67.6 kg (149 lb 1.6 oz), SpO2 96 %.   IV Fluids:  IV in place, occlusive dsg intact without redness, IV cath antecubital left, condition patent and no redness none.   Allergies:  Patient has no known allergies.  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  Past Surgical History:   has a past surgical history that includes Bronchoscopy (01/31/2005); spirometry (07/03/2002); tvh (12/01/2000); portacath removed (10/2007); biopsy of right neck mass; Bronchoscopy (08/24/2010); Lung removal, partial; melanoma removal; Melanoma excision with sentinel lymph node dissection (Right, 11/29/2014); Colonoscopy; Polypectomy; and Breast excisional biopsy (Right, 1987).  Social History:    reports that she quit smoking about 15 years ago. Her smoking use included Cigarettes. She started smoking about 38 years ago. She has a 46.00 pack-year smoking history. She has never used smokeless tobacco. She reports that she does not drink alcohol or use drugs.  Skin: Clean, dry and intact.  Patient/Family orientated to room. Information packet given to patient/family. Admission inpatient armband information verified with patient/family to include name and date of birth and placed on patient arm. Side rails up x 2, fall assessment and education completed with patient/family. Patient/family able to verbalize understanding of risk associated with falls and verbalized understanding to call for assistance before getting out of bed. Call light within reach. Patient/family able to voice and demonstrate understanding of unit orientation instructions.    Will continue to evaluate and treat per MD orders.

## 2017-05-01 NOTE — Assessment & Plan Note (Addendum)
Acute process at this visit does not seem like a COPD exacerbation. Tachycardia might reflect heavy use of albuterol rescue inhaler-discussed. Likely minimal difference, but try change Breo to Anoro. Plan-schedule PFT. Home oxygen started.

## 2017-05-01 NOTE — Progress Notes (Signed)
Family Medicine Teaching Service Daily Progress Note Intern Pager: [phone]  Patient name: [name] Medical record number: [id number] Date of birth: Jun 28, 1966 Age: 51 y.o. Gender: female  Primary Care Provider: Bonnita Hollow, MD Consultants: Pulmonology Code Status: Full  Pt Overview and Major Events to Date:    [name] is a 51 y.o. female presenting with shortness of breath. PMH is significant for GAD, migraines, h/o non-small cell lung cancer  Assessment and Plan: Dyspnea: Improved after dieuresis. Denies SOB at rest, but becomes winded when walking to bathroom without O2. Troponins 0.00 > 0.04 > 0.03. BNP 679. HR 114. RR 24. O2 sats 96% on 2L [place]. Likely new onset CHF vs COPD exacerbation vs infectious etiology. Afebrile. WBC 7.6. CTA: No evidence of PE. Increased cardiomegaly, and reflux of contrast into IVC hepatic veins consistent with right heart insufficiency. Increased symmetric ground-glass pulmonary opacity, likely due to mild diffuse pulmonary edema. New tiny right pleural effusion. CXR: Chronic postsurgical changes with slightly prominent interstitial densities in both lungs. Minimal change from the previous examinations.s/p 1 treatment of IV Lasix 40 mg.  - Admit to telemetry for inpatient, attending Dr. Dorcas Mcmurray  - Vitals per unit - Continuous pulse ox - Incentive spirometry - repeat bnp - ECHO, pending - EKG showed possible anterior infarct, repeat showed possible anterior/lateral ischema, possibly due to new heart strain, consider cardiology consult if troponins increase - Supplemental oxygen via [place] for O2 88-94% - Anoro was started today by pulm - Albuterolq6 prn for breakthrough SOB, wheezing - Duoneb q4 - pulmonology consult appreciated - hold lasix, consider restarting if pulmonary status worsens  COPD: SOB worsening on exertion. Satting 96% on 2L O2 [place] - Continue home meds  Constipation: chronic. - Miralax prn      Hypokalemia. Pt  mildly hypokalemic at 3.1 this AM. Pt complaining of foot and back cramps. Likely due to recent dieuresis.  -replete w/ Kdur  Generalized anxiety disorder: Uses Xanax BID- when needed. Has a history of panic attacks. - monitor and consider giving xanax if has panic attack  H/o Adenocarcinoma, colon: Adenocarcinoma of colon 2016, removed. - Monitor              H/o Malignant melanoma of skin of right lower extremity: Had left leg melanoma removed 2016 - Monitor              H/o Non-small cell cancer of right lung: Thoracotomy 1/3 right lobe, chemo 5 years, radiation for 1 year, dx 2003   - Followed closely by Dr. Baird Lyons, Pulmonology  Migraine: Has chronic headaches - Tylenol 650mg  prn for pain              Insomnia: Uses ambien 2-3 times per week, usually takes xanax twice per day - Continue home ambien as required for sleep          GAD. Chronic. Pt endorses feeling anxious in hospital. Asked for home xanax -restart home xanax    FEN/GI: Regular diet Prophylaxis: Levonox  Disposition: Admit to telemetry  Subjective:  Feels that she can breath much better. Denies chest pain. Feels anxious. Complaining of foot cramps  Objective: Temp:  [97.8 F (36.6 C)-98.2 F (36.8 C)] 98.2 F (36.8 C) (08/30 0428) Pulse Rate:  [101-118] 101 (08/30 0428) Resp:  [14-27] 18 (08/30 0428) BP: (105-142)/(64-98) 107/64 (08/30 0428) SpO2:  [93 %-100 %] 96 % (08/30 0428) Weight:  [149 lb 1.6 oz (67.6 kg)-153 lb (69.4 kg)] 149 lb  1.6 oz (67.6 kg) (08/30 0428) Physical Exam: General: nad, awake, resting in bed Cardiovascular: rrr, no mrg Respiratory: throughout all lung fields broncovesicular breath sounds, slight expiratory wheeze, no crackles Abdomen: soft, nontender Extremities: no lower extremity edema  Laboratory:  Recent Labs Lab 04/30/17 1627 05/01/17 0432  WBC 7.6 7.2  HGB 13.1 13.6  HCT 38.9 40.2  PLT 216 200    Recent Labs Lab 04/30/17 1627 05/01/17 0432   NA 138 139  K 3.6 3.1*  CL 105 103  CO2 22 27  BUN 6 6  CREATININE 0.77 0.78  CALCIUM 9.0 9.0  GLUCOSE 85 92      Imaging/Diagnostic Tests: Dg Chest 2 View  Result Date: 04/30/2017 CLINICAL DATA:  Shortness of breath. EXAM: CHEST  2 VIEW COMPARISON:  12/11/2016 and chest CT from 04/30/2017 FINDINGS: Previous right lung surgery. Stable postsurgical changes in the right chest and right side of the mediastinum. Patchy interstitial densities in the lower chest, particularly at the left lung base. Heart size is within normal limits and stable. No large pleural effusions. IMPRESSION: Chronic postsurgical changes with slightly prominent interstitial densities in both lungs. Minimal change from the previous examinations but difficult to exclude mild edema particularly based on the recent CT findings. Electronically Signed   By: Markus Daft M.D.   On: 04/30/2017 17:17   Ct Angio Chest W/cm &/or Wo Cm  Result Date: 04/30/2017 CLINICAL DATA:  Worsening shortness of breath and hypoxia for 6 weeks. High pretest probability for pulmonary embolism. Personal history of lung carcinoma. EXAM: CT ANGIOGRAPHY CHEST WITH CONTRAST TECHNIQUE: Multidetector CT imaging of the chest was performed using the standard protocol during bolus administration of intravenous contrast. Multiplanar CT image reconstructions and MIPs were obtained to evaluate the vascular anatomy. CONTRAST:  80 mL Isovue 370 COMPARISON:  08/01/2015 FINDINGS: Cardiovascular: Satisfactory opacification of pulmonary arteries noted, and no pulmonary emboli identified. No evidence of thoracic aortic dissection or aneurysm. Aortic atherosclerosis. Increased cardiomegaly since prior study. Increased reflux of contrast into the IVC and hepatic veins, consistent with right heart insufficiency. No evidence of pericardial effusion. Mediastinum/Nodes: No masses or pathologically enlarged lymph nodes identified. Lungs/Pleura: Stable post treatment changes seen in  the right paramediastinal lung zone. Mild emphysema again noted. No suspicious pulmonary nodules or masses identified. Increased mild diffuse ground-glass pulmonary opacity is seen bilaterally, suspicious for mild pulmonary edema. No evidence of pulmonary consolidation. Tiny right pleural effusion is new since prior exam. Upper abdomen: No acute findings. Musculoskeletal: No suspicious bone lesions identified. Review of the MIP images confirms the above findings. IMPRESSION: No evidence of pulmonary embolism. Increased cardiomegaly, and reflux of contrast into IVC hepatic veins consistent with right heart insufficiency. Increased symmetric ground-glass pulmonary opacity, likely due to mild diffuse pulmonary edema. New tiny right pleural effusion. Stable posttreatment changes in right hemithorax. No evidence of recurrent or metastatic carcinoma within the thorax. Aortic Atherosclerosis (ICD10-I70.0) and Emphysema (ICD10-J43.9). Electronically Signed   By: Earle Gell M.D.   On: 04/30/2017 15:06    Bonnita Hollow, MD 05/01/2017, 7:02 AM PGY-1, [place] Intern pager: [phone], text pages welcome

## 2017-05-02 ENCOUNTER — Ambulatory Visit (HOSPITAL_COMMUNITY): Payer: Medicare Other

## 2017-05-02 DIAGNOSIS — I340 Nonrheumatic mitral (valve) insufficiency: Secondary | ICD-10-CM

## 2017-05-02 DIAGNOSIS — I509 Heart failure, unspecified: Secondary | ICD-10-CM

## 2017-05-02 DIAGNOSIS — J449 Chronic obstructive pulmonary disease, unspecified: Secondary | ICD-10-CM

## 2017-05-02 DIAGNOSIS — I351 Nonrheumatic aortic (valve) insufficiency: Secondary | ICD-10-CM

## 2017-05-02 HISTORY — DX: Heart failure, unspecified: I50.9

## 2017-05-02 LAB — BASIC METABOLIC PANEL
Anion gap: 8 (ref 5–15)
BUN: 6 mg/dL (ref 6–20)
CHLORIDE: 107 mmol/L (ref 101–111)
CO2: 25 mmol/L (ref 22–32)
CREATININE: 0.76 mg/dL (ref 0.44–1.00)
Calcium: 8.8 mg/dL — ABNORMAL LOW (ref 8.9–10.3)
GFR calc Af Amer: >60 mL/min (ref >60)
GFR calc non Af Amer: >60 mL/min (ref >60)
GLUCOSE: 98 mg/dL (ref 65–99)
POTASSIUM: 3.7 mmol/L (ref 3.5–5.1)
SODIUM: 140 mmol/L (ref 135–145)

## 2017-05-02 LAB — CBC
HCT: 40.2 % (ref 36.0–46.0)
Hemoglobin: 13.2 g/dL (ref 12.0–15.0)
MCH: 28.2 pg (ref 26.0–34.0)
MCHC: 32.8 g/dL (ref 30.0–36.0)
MCV: 85.9 fL (ref 78.0–100.0)
Platelets: 186 10*3/uL (ref 150–400)
RBC: 4.68 MIL/uL (ref 3.87–5.11)
RDW: 13.6 % (ref 11.5–15.5)
WBC: 4.9 10*3/uL (ref 4.0–10.5)

## 2017-05-02 LAB — LIPID PANEL
CHOL/HDL RATIO: 4.4 ratio
CHOLESTEROL: 155 mg/dL (ref 0–200)
HDL: 35 mg/dL — AB (ref >40)
LDL CALC: 89 mg/dL (ref 0–99)
TRIGLYCERIDES: 154 mg/dL — AB (ref <150)
VLDL: 31 mg/dL (ref 0–40)

## 2017-05-02 LAB — ECHOCARDIOGRAM COMPLETE
Height: 65 in
WEIGHTICAEL: 2435.2 [oz_av]

## 2017-05-02 LAB — HEMOGLOBIN A1C
HEMOGLOBIN A1C: 4.9 % (ref 4.8–5.6)
Mean Plasma Glucose: 93.93 mg/dL

## 2017-05-02 MED ORDER — IPRATROPIUM-ALBUTEROL 0.5-2.5 (3) MG/3ML IN SOLN
3.0000 mL | Freq: Four times a day (QID) | RESPIRATORY_TRACT | Status: DC | PRN
  Administered 2017-05-07 – 2017-05-12 (×2): 3 mL via RESPIRATORY_TRACT
  Filled 2017-05-02 (×2): qty 3

## 2017-05-02 MED ORDER — ATORVASTATIN CALCIUM 40 MG PO TABS
40.0000 mg | ORAL_TABLET | Freq: Every day | ORAL | Status: DC
  Administered 2017-05-02 – 2017-05-12 (×11): 40 mg via ORAL
  Filled 2017-05-02 (×11): qty 1

## 2017-05-02 MED ORDER — METOPROLOL TARTRATE 12.5 MG HALF TABLET
12.5000 mg | ORAL_TABLET | Freq: Two times a day (BID) | ORAL | Status: DC
  Administered 2017-05-02: 12.5 mg via ORAL
  Filled 2017-05-02: qty 1

## 2017-05-02 MED ORDER — PERFLUTREN LIPID MICROSPHERE: 1.0000 mL | INTRAVENOUS | Status: DC | PRN

## 2017-05-02 MED ORDER — METOPROLOL TARTRATE 25 MG PO TABS
25.0000 mg | ORAL_TABLET | Freq: Two times a day (BID) | ORAL | Status: DC
  Administered 2017-05-02: 25 mg via ORAL
  Filled 2017-05-02: qty 1

## 2017-05-02 MED ORDER — LOSARTAN POTASSIUM 50 MG PO TABS
25.0000 mg | ORAL_TABLET | Freq: Every day | ORAL | Status: DC
  Administered 2017-05-02 – 2017-05-13 (×11): 25 mg via ORAL
  Filled 2017-05-02 (×12): qty 1

## 2017-05-02 MED ORDER — PERFLUTREN LIPID MICROSPHERE
1.0000 mL | INTRAVENOUS | Status: AC | PRN
  Administered 2017-05-02: 2 mL via INTRAVENOUS
  Filled 2017-05-02: qty 10

## 2017-05-02 NOTE — Progress Notes (Signed)
Family Medicine Teaching Service Daily Progress Note Intern Pager: [phone]  Patient name: [name] Medical record number: [id number] Date of birth: June 25, 1966 Age: 51 y.o. 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 PMH is significant for GAD, migraines, h/o non-small cell lung cancer  Assessment and Plan: Dyspnea: Improved after dieuresis. Denies SOB at rest, but becomes winded when walking to bathroom without O2. BNP 679. HR 102. RR 18. O2 sats 94% on 1L [place]. Likely new onset CHF vs COPD exacerbation. CTA: No evidence of PE. Increased cardiomegaly, and reflux of contrast into IVC hepatic veins consistent with right heart insufficiency. Increased symmetric ground-glass pulmonary opacity, likely due to mild diffuse pulmonary edema. New tiny right pleural effusion. CXR: Chronic postsurgical changes with slightly prominent interstitial densities in both lungs. Minimal change from the previous examinations. s/p 1 treatment of IV Lasix 40 mg. Pt seems to have a baseline tachycardia around low 100s looking through prior charts.  - cardiac monitoring - Vitals per unit - Continuous pulse ox - Incentive spirometry - ECHO, pending - Supplemental oxygen via [place] for O2 88-94% - Anoro was started today by pulm - Albuterolq6 prn for breakthrough SOB, wheezing - Duoneb q4 - pulmonology consult appreciated - hold lasix, consider restarting if pulmonary status worsens - Pulmonologist Dr. Annamaria Boots had written order to send her home w/ O2 before admission - lipid panel, a1c, pending - metoprolol tartrate, should discharge home with succinate  Elevated troponins. Troponins 0.00 > 0.04 > 0.03 > <0.03. EKG showed anterolateral T-wave abnormalities. Twave abnormalities seem present on previous ecg on 11/2014. Denies chest  pressure/pain. Possibly due to heart strain in the setting of new onset of CHF - consider cardiology consult develops chest pain - ECHO pending  COPD: SOB worsening on exertion. Satting 96% on 2L O2 [place] - Continue Anoro Ellipta  Constipation: chronic. - Miralax prn      Hypokalemia. Resolved. Repeat K 3.7 this AM.  -cont to monitor K  H/o Adenocarcinoma, colon: Adenocarcinoma of colon 2016, removed. - Monitor              H/o Malignant melanoma of skin of right lower extremity: Had left leg melanoma removed 2016 - Monitor              H/o Non-small cell cancer of right lung: Thoracotomy 1/3 right lobe, chemo 5 years, radiation for 1 year, dx 2003   - Followed closely by Dr. Baird Lyons, Pulmonology  Migraine: Has chronic headaches - Tylenol 650mg  prn for pain              Insomnia: Uses ambien 2-3 times per week, usually takes xanax twice per day - Continue home ambien as required for sleep          GAD. Chronic. Pt endorses feeling anxious in hospital. Asked for home xanax. Takes 1mg  two times a day prn.  -cont home xanax    FEN/GI: Regular diet Prophylaxis: Levonox  Disposition: likely home, pending echo  Subjective:  Pt says she feels that breathing is better, able to walk down the hall yesterday. Denies chest pain. Endorses tachycardia when walking down the hall.   Objective: Temp:  [97.7 F (36.5 C)-98.1 F (36.7 C)] 97.8 F (36.6 C) (08/31 0418) Pulse Rate:  [102-114] 102 (08/31 0418) Resp:  [18] 18 (08/31  0418) BP: (98-115)/(69-76) 103/73 (08/31 0418) SpO2:  [94 %-97 %] 94 % (08/31 0418) Weight:  [152 lb 3.2 oz (69 kg)] 152 lb 3.2 oz (69 kg) (08/31 0418) Physical Exam: General: nad, awake, resting in bed Cardiovascular: rrr, no mrg Respiratory: throughout all lung fields broncovesicular breath sounds, slight expiratory wheeze, no crackles Abdomen: soft, nontender Extremities: no lower extremity edema, 2+ dp  Laboratory:  Recent Labs Lab  04/30/17 1627 05/01/17 0432 05/02/17 0347  WBC 7.6 7.2 4.9  HGB 13.1 13.6 13.2  HCT 38.9 40.2 40.2  PLT 216 200 186    Recent Labs Lab 04/30/17 1627 05/01/17 0432 05/02/17 0347  NA 138 139 140  K 3.6 3.1* 3.7  CL 105 103 107  CO2 22 27 25   BUN 6 6 6   CREATININE 0.77 0.78 0.76  CALCIUM 9.0 9.0 8.8*  GLUCOSE 85 92 98      Imaging/Diagnostic Tests: Dg Chest 2 View  Result Date: 04/30/2017 CLINICAL DATA:  Shortness of breath. EXAM: CHEST  2 VIEW COMPARISON:  12/11/2016 and chest CT from 04/30/2017 FINDINGS: Previous right lung surgery. Stable postsurgical changes in the right chest and right side of the mediastinum. Patchy interstitial densities in the lower chest, particularly at the left lung base. Heart size is within normal limits and stable. No large pleural effusions. IMPRESSION: Chronic postsurgical changes with slightly prominent interstitial densities in both lungs. Minimal change from the previous examinations but difficult to exclude mild edema particularly based on the recent CT findings. Electronically Signed   By: Markus Daft M.D.   On: 04/30/2017 17:17   Ct Angio Chest W/cm &/or Wo Cm  Result Date: 04/30/2017 CLINICAL DATA:  Worsening shortness of breath and hypoxia for 6 weeks. High pretest probability for pulmonary embolism. Personal history of lung carcinoma. EXAM: CT ANGIOGRAPHY CHEST WITH CONTRAST TECHNIQUE: Multidetector CT imaging of the chest was performed using the standard protocol during bolus administration of intravenous contrast. Multiplanar CT image reconstructions and MIPs were obtained to evaluate the vascular anatomy. CONTRAST:  80 mL Isovue 370 COMPARISON:  08/01/2015 FINDINGS: Cardiovascular: Satisfactory opacification of pulmonary arteries noted, and no pulmonary emboli identified. No evidence of thoracic aortic dissection or aneurysm. Aortic atherosclerosis. Increased cardiomegaly since prior study. Increased reflux of contrast into the IVC and  hepatic veins, consistent with right heart insufficiency. No evidence of pericardial effusion. Mediastinum/Nodes: No masses or pathologically enlarged lymph nodes identified. Lungs/Pleura: Stable post treatment changes seen in the right paramediastinal lung zone. Mild emphysema again noted. No suspicious pulmonary nodules or masses identified. Increased mild diffuse ground-glass pulmonary opacity is seen bilaterally, suspicious for mild pulmonary edema. No evidence of pulmonary consolidation. Tiny right pleural effusion is new since prior exam. Upper abdomen: No acute findings. Musculoskeletal: No suspicious bone lesions identified. Review of the MIP images confirms the above findings. IMPRESSION: No evidence of pulmonary embolism. Increased cardiomegaly, and reflux of contrast into IVC hepatic veins consistent with right heart insufficiency. Increased symmetric ground-glass pulmonary opacity, likely due to mild diffuse pulmonary edema. New tiny right pleural effusion. Stable posttreatment changes in right hemithorax. No evidence of recurrent or metastatic carcinoma within the thorax. Aortic Atherosclerosis (ICD10-I70.0) and Emphysema (ICD10-J43.9). Electronically Signed   By: Earle Gell M.D.   On: 04/30/2017 15:06    Bonnita Hollow, MD 05/02/2017, 6:13 AM PGY-1, [place] Intern pager: [phone], text pages welcome

## 2017-05-02 NOTE — Consult Note (Signed)
Referring Physician: Dorcas Mcmurray, MD/Aaron Grandville Silos, MD/[facility]   [name] is an 51 y.o. female.                       Chief Complaint: Shortness of breath  HPI: 51 year old female with PMH of non-small cell lung cancer treated with surgery, 5 year chemo and 1 year radiation and COPD (but quit smoking in 2003) has increasing exertional dyspnea. Her chest x-ray showed pulmonary edema, Echocardiogram showed global hypokinesia with 20-25 % EF, her BNP was elevated at 679.1 and troponin-I was 0.03 to 0.04 ng. She had good diuresis with lasix and has decreased shortness of breath.  Past Medical History:  Diagnosis Date  . 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 Allergic rhinitis   . Allergy   . Anxiety disorder   . Asthma   . Chronic bronchitis   . Colon cancer ([place])    adenocarcinoma in a polyps 11-24-2014  . COPD (chronic obstructive pulmonary disease) ([place])   . Cough   . Dizziness and giddiness   . Dyspepsia   . Emphysema of lung ([place])   . External hemorrhoids   . EXTERNAL HEMORRHOIDS 09/27/2009  . Female stress incontinence   . GERD (gastroesophageal reflux disease)    occ depending on diet   . Goiter   . History of lung cancer   . Hypercholesteremia    denies-last check normal labs  . 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 Insomnia   . Lung cancer ([place])   . Migraine, unspecified, without mention of intractable migraine without mention of status migrainosus   . Panic attacks   . Seizures ([place])    very long ago like 15 years ago or so , questionable etilogy  . Skin cancer (melanoma) ([place])    right calf      Past Surgical History:  Procedure Laterality Date  . biopsy of right neck mass     negative-thyroid biopsy   . BREAST EXCISIONAL BIOPSY Right 1987   no visible scar  . BRONCHOSCOPY  01/31/2005   nonspecific inflammation  . BRONCHOSCOPY  08/24/2010   nonspecific inflammation  . COLONOSCOPY    . LUNG REMOVAL, PARTIAL     for lung  cancer--followed by Dr. Arlyce Dice and Dr. Annamaria Boots  . MELANOMA EXCISION WITH SENTINEL LYMPH NODE BIOPSY Right 11/29/2014   Procedure: RIGHT INGUINAL SENTINEL LYMPH NODE BIOPSY;  Surgeon: Georganna Skeans, MD;  Location: [facility];  Service: General;  Laterality: Right;  . melanoma removal     right calf   . POLYPECTOMY    . portacath removed  10/2007  . SPIROMETRY  07/03/2002   min obstruction,mild restriction 01/31/2005  . tvh  12/01/2000   hysterectomy    Family History  Problem Relation Age of Onset  . Lung cancer Father        smoker and worked at a [employer]  . COPD Mother        not a smoker  . Other Mother        hx of hysterectomy for unspecified reason  . Asthma Daughter   . Diabetes Maternal Grandfather   . Colon polyps Brother        approx 16 polyps on his first colonoscopy  . Other Maternal Aunt        non-cancerous growth in lungs; respiratory issues; smoker  . COPD Maternal Grandmother        d. 71  . Cancer  Paternal Grandfather        oral/mouth cancer; chewed tobacco; d. older age  . Throat cancer Other        maternal great aunt (MGF's sister); not a smoker  . Cirrhosis Paternal Uncle        hx of alcohol abuse   . Colon cancer Neg Hx   . Rectal cancer Neg Hx   . Stomach cancer Neg Hx    Social History:  reports that she quit smoking about 15 years ago. Her smoking use included Cigarettes. She started smoking about 38 years ago. She has a 46.00 pack-year smoking history. She has never used smokeless tobacco. She reports that she does not drink alcohol or use drugs.  Allergies: No Known Allergies  Facility-Administered Medications Prior to Admission  Medication Dose Route Frequency Provider Last Rate Last Dose  . 0.9 %  sodium chloride infusion  500 mL Intravenous Continuous Armbruster, Carlota Raspberry, MD       Medications Prior to Admission  Medication Sig Dispense Refill  . acetaminophen (TYLENOL) 650 MG CR tablet Take 650 mg by mouth every 8 (eight) hours as needed  for pain.    Marland Kitchen albuterol (PROAIR HFA) 108 (90 Base) MCG/ACT inhaler Inhale 2 puffs every 6 hours as needed 3 Inhaler 3  . albuterol (PROVENTIL) (2.5 MG/3ML) 0.083% nebulizer solution Take 3 mLs (2.5 mg total) by nebulization every 6 (six) hours as needed for wheezing or shortness of breath. 75 vial prn  . ALPRAZolam (XANAX) 1 MG tablet Take 1 tablet (1 mg total) by mouth 2 (two) times daily as needed for anxiety. 60 tablet 5  . Calcium Carbonate Antacid (MAALOX PO) Take 1 tablet by mouth as needed (heartburn).    . polyethylene glycol powder (GLYCOLAX/MIRALAX) powder Take 17 g by mouth 2 (two) times daily as needed. 225 g 5  . PROAIR RESPICLICK 323 (90 Base) MCG/ACT AEPB INHALE 2 PUFFS INTO THE LUNGS EVERY 6 HOURS AS NEEDED. 1 each 4  . umeclidinium-vilanterol (ANORO ELLIPTA) 62.5-25 MCG/INH AEPB Inhale 1 puff into the lungs daily. 2 each 0  . zolpidem (AMBIEN) 5 MG tablet 1 at bedtime for sleep as needed (Patient taking differently: Take 5 mg by mouth at bedtime as needed for sleep. ) 30 tablet 5  . fluticasone furoate-vilanterol (BREO ELLIPTA) 100-25 MCG/INH AEPB Inhale 1 puff into the lungs daily. Rinse after each use (Patient not taking: Reported on 04/30/2017) 3 each 3    Results for orders placed or performed during the hospital encounter of 04/30/17 (from the past 48 hour(s))  HIV antibody (Routine Testing)     Status: None   Collection Time: 04/30/17  9:25 PM  Result Value Ref Range   HIV Screen 4th Generation wRfx Non Reactive Non Reactive    Comment: (NOTE) Performed At: [facility] [address] [place], [place] [id number] Lindon Romp MD [id number]   Troponin I     Status: Abnormal   Collection Time: 04/30/17  9:25 PM  Result Value Ref Range   Troponin I 0.04 (HH) <0.03 ng/mL    Comment: CRITICAL RESULT CALLED TO, READ BACK BY AND VERIFIED WITH: GRAY J,RN 04/30/17 2304 WAYK   TSH     Status: None   Collection Time: 04/30/17  9:25 PM  Result Value Ref Range    TSH 1.695 0.350 - 4.500 uIU/mL    Comment: Performed by a 3rd Generation assay with a functional sensitivity of <=0.01 uIU/mL.  Troponin I  Status: Abnormal   Collection Time: 05/01/17  4:32 AM  Result Value Ref Range   Troponin I 0.03 (HH) <0.03 ng/mL    Comment: CRITICAL VALUE NOTED.  VALUE IS CONSISTENT WITH PREVIOUSLY REPORTED AND CALLED VALUE.  Basic metabolic panel     Status: Abnormal   Collection Time: 05/01/17  4:32 AM  Result Value Ref Range   Sodium 139 135 - 145 mmol/L   Potassium 3.1 (L) 3.5 - 5.1 mmol/L   Chloride 103 101 - 111 mmol/L   CO2 27 22 - 32 mmol/L   Glucose, Bld 92 65 - 99 mg/dL   BUN 6 6 - 20 mg/dL   Creatinine, Ser 0.78 0.44 - 1.00 mg/dL   Calcium 9.0 8.9 - 10.3 mg/dL   GFR calc non Af Amer >60 >60 mL/min   GFR calc Af Amer >60 >60 mL/min    Comment: (NOTE) The eGFR has been calculated using the CKD EPI equation. This calculation has not been validated in all clinical situations. eGFR's persistently <60 mL/min signify possible Chronic Kidney Disease.    Anion gap 9 5 - 15  CBC     Status: None   Collection Time: 05/01/17  4:32 AM  Result Value Ref Range   WBC 7.2 4.0 - 10.5 K/uL   RBC 4.72 3.87 - 5.11 MIL/uL   Hemoglobin 13.6 12.0 - 15.0 g/dL   HCT 40.2 36.0 - 46.0 %   MCV 85.2 78.0 - 100.0 fL   MCH 28.8 26.0 - 34.0 pg   MCHC 33.8 30.0 - 36.0 g/dL   RDW 13.3 11.5 - 15.5 %   Platelets 200 150 - 400 K/uL  Brain natriuretic peptide     Status: Abnormal   Collection Time: 05/01/17  4:32 AM  Result Value Ref Range   B Natriuretic Peptide 656.4 (H) 0.0 - 100.0 pg/mL  Troponin I     Status: None   Collection Time: 05/01/17 10:02 AM  Result Value Ref Range   Troponin I <0.03 <0.03 ng/mL  CBC     Status: None   Collection Time: 05/02/17  3:47 AM  Result Value Ref Range   WBC 4.9 4.0 - 10.5 K/uL   RBC 4.68 3.87 - 5.11 MIL/uL   Hemoglobin 13.2 12.0 - 15.0 g/dL   HCT 40.2 36.0 - 46.0 %   MCV 85.9 78.0 - 100.0 fL   MCH 28.2 26.0 - 34.0 pg    MCHC 32.8 30.0 - 36.0 g/dL   RDW 13.6 11.5 - 15.5 %   Platelets 186 150 - 400 K/uL  Basic metabolic panel     Status: Abnormal   Collection Time: 05/02/17  3:47 AM  Result Value Ref Range   Sodium 140 135 - 145 mmol/L   Potassium 3.7 3.5 - 5.1 mmol/L   Chloride 107 101 - 111 mmol/L   CO2 25 22 - 32 mmol/L   Glucose, Bld 98 65 - 99 mg/dL   BUN 6 6 - 20 mg/dL   Creatinine, Ser 0.76 0.44 - 1.00 mg/dL   Calcium 8.8 (L) 8.9 - 10.3 mg/dL   GFR calc non Af Amer >60 >60 mL/min   GFR calc Af Amer >60 >60 mL/min    Comment: (NOTE) The eGFR has been calculated using the CKD EPI equation. This calculation has not been validated in all clinical situations. eGFR's persistently <60 mL/min signify possible Chronic Kidney Disease.    Anion gap 8 5 - 15  Lipid panel  Status: Abnormal   Collection Time: 05/02/17 11:08 AM  Result Value Ref Range   Cholesterol 155 0 - 200 mg/dL   Triglycerides 154 (H) <150 mg/dL   HDL 35 (L) >40 mg/dL   Total CHOL/HDL Ratio 4.4 RATIO   VLDL 31 0 - 40 mg/dL   LDL Cholesterol 89 0 - 99 mg/dL    Comment:        Total Cholesterol/HDL:CHD Risk Coronary Heart Disease Risk Table                     Men   Women  1/2 Average Risk   3.4   3.3  Average Risk       5.0   4.4  2 X Average Risk   9.6   7.1  3 X Average Risk  23.4   11.0        Use the calculated Patient Ratio above and the CHD Risk Table to determine the patient's CHD Risk.        ATP III CLASSIFICATION (LDL):  <100     mg/dL   Optimal  100-129  mg/dL   Near or Above                    Optimal  130-159  mg/dL   Borderline  160-189  mg/dL   High  >190     mg/dL   Very High   Hemoglobin A1c     Status: None   Collection Time: 05/02/17 11:08 AM  Result Value Ref Range   Hgb A1c MFr Bld 4.9 4.8 - 5.6 %    Comment: (NOTE) Pre diabetes:          5.7%-6.4% Diabetes:              >6.4% Glycemic control for   <7.0% adults with diabetes    Mean Plasma Glucose 93.93 mg/dL   Dg Chest 2  View  Result Date: 04/30/2017 CLINICAL DATA:  Shortness of breath. EXAM: CHEST  2 VIEW COMPARISON:  12/11/2016 and chest CT from 04/30/2017 FINDINGS: Previous right lung surgery. Stable postsurgical changes in the right chest and right side of the mediastinum. Patchy interstitial densities in the lower chest, particularly at the left lung base. Heart size is within normal limits and stable. No large pleural effusions. IMPRESSION: Chronic postsurgical changes with slightly prominent interstitial densities in both lungs. Minimal change from the previous examinations but difficult to exclude mild edema particularly based on the recent CT findings. Electronically Signed   By: Markus Daft M.D.   On: 04/30/2017 17:17    Review Of Systems Constitutional: No fever, chills, weight loss or gain. Eyes: No vision change, wears glasses. No discharge or pain. Ears: No hearing loss, No tinnitus. Respiratory: No asthma, positive COPD, pneumonias, shortness of breath. No hemoptysis. Cardiovascular: Occasional chest pain, palpitation, leg edema. Gastrointestinal: No nausea, vomiting, diarrhea, constipation. No GI bleed. No hepatitis. Genitourinary: No dysuria, hematuria, kidney stone. No incontinance. Neurological: No headache, stroke, seizures.  Psychiatry: No psych facility admission for anxiety, depression, suicide. No detox. Skin: No rash. Musculoskeletal: No joint pain, fibromyalgia. No neck pain, back pain. Lymphadenopathy: No lymphadenopathy. Hematology: No anemia or easy bruising.   Blood pressure 106/84, pulse (!) 117, temperature 97.8 F (36.6 C), temperature source Oral, resp. rate 18, height _0  (1.651 m), weight 69 kg (152 lb 3.2 oz), SpO2 92 %. Body mass index is 25.33 kg/m. General appearance: alert, cooperative, appears stated age and  no distress Head: Normocephalic, atraumatic. Eyes: Hazel eyes, pink conjunctiva, corneas clear. PERRL, EOM's intact. Neck: No adenopathy, no carotid bruit, no  JVD, supple, symmetrical, trachea midline and thyroid not enlarged. Resp: Clearing to auscultation bilaterally. Cardio: Regular rate and rhythm, S1, S2 normal, II/VI systolic murmur, no click, rub or gallop GI: Soft, non-tender; bowel sounds normal; no organomegaly. Extremities: Trace edema, cyanosis or clubbing. Skin: Warm and dry.  Neurologic: Alert and oriented X 3, normal strength. Normal coordination and gait.  Assessment/Plan Acute left heart systolic failure COPD S/P lung cancer S/P colon cancer S/P skin cancer Sinus tachycardia  Agree with diuresis. Increase B-blocker as tolerated. Add Losartan with possible Entresto use in future. May benefit from R+ L heart catheterization on Tuesday.  Birdie Riddle, MD  05/02/2017, 5:00 PM

## 2017-05-02 NOTE — Progress Notes (Addendum)
  Echocardiogram 2D Echocardiogram has been performed.  Attempted definity. Problem with IV. Nurse notified.   Sabastian Raimondi L Androw 05/02/2017, 11:52 AM

## 2017-05-03 LAB — BASIC METABOLIC PANEL
Anion gap: 10 (ref 5–15)
BUN: 5 mg/dL — ABNORMAL LOW (ref 6–20)
CHLORIDE: 105 mmol/L (ref 101–111)
CO2: 25 mmol/L (ref 22–32)
Calcium: 9.1 mg/dL (ref 8.9–10.3)
Creatinine, Ser: 0.66 mg/dL (ref 0.44–1.00)
GFR calc non Af Amer: >60 mL/min (ref >60)
Glucose, Bld: 88 mg/dL (ref 65–99)
POTASSIUM: 4 mmol/L (ref 3.5–5.1)
SODIUM: 140 mmol/L (ref 135–145)

## 2017-05-03 LAB — CBC
HEMATOCRIT: 38.3 % (ref 36.0–46.0)
HEMOGLOBIN: 13 g/dL (ref 12.0–15.0)
MCH: 29.1 pg (ref 26.0–34.0)
MCHC: 33.9 g/dL (ref 30.0–36.0)
MCV: 85.7 fL (ref 78.0–100.0)
Platelets: 193 10*3/uL (ref 150–400)
RBC: 4.47 MIL/uL (ref 3.87–5.11)
RDW: 13.7 % (ref 11.5–15.5)
WBC: 5.4 10*3/uL (ref 4.0–10.5)

## 2017-05-03 MED ORDER — METOPROLOL TARTRATE 25 MG PO TABS
25.0000 mg | ORAL_TABLET | Freq: Three times a day (TID) | ORAL | Status: DC
  Administered 2017-05-03 – 2017-05-07 (×11): 25 mg via ORAL
  Filled 2017-05-03 (×12): qty 1

## 2017-05-03 NOTE — Progress Notes (Signed)
Family Medicine Teaching Service Daily Progress Note Intern Pager: [phone]  Patient name: [name] Medical record number: [id number] Date of birth: [birth year] Age: 51 y.o. Gender: female  Primary Care Provider: Bonnita Hollow, MD Consultants: Pulmonology Code Status: Full  Pt Overview and Major Events to Date:  [name] is a 51 y.o. female presenting with shortness of breath. PMH is significant for GAD, migraines, h/o non-small cell lung cancer  Assessment and Plan: Dyspnea 2/2 new onset CHF: Improved after dieuresis. Denies SOB at rest. BNP 679. HR 102. RR 18. O2 sats 94% on 1L [place]. Likely due to new onset CHF - echo with EF 20-25% with diffuse hypokinesis. CXR with minimal change from the previous examinations. Pt seems to have a baseline tachycardia around low 100s looking through prior charts. UOP 1.5L last 24 hours. Last lasix 04/30/17. a1c 4.9. LDL 89, triglycerides 154. Of note, spoke to Dr. Terrence [name] today who states Dr. Doylene [name] plans [name] on Tuesday, will continue to monitor BP and volume status. Could consider 12.5mg  aldactone per his recs if BP can tolerate. - cardiac monitoring - Continuous pulse ox - Incentive spirometry - Supplemental oxygen via [name] for O2 88-94% - Anoro per pulm - Albuterolq6 prn for breakthrough SOB, wheezing - Duoneb q4 - hold lasix, consider restarting if respiratory status worsens - atorvastatin 40mg  - metoprolol tartrate, should discharge home with succinate - cardiology following: agree with BB, no diuresis recommendations, possible L/RHC 9/3; considering entresto in the future - daily BMP  COPD: Stable. SOB worsening on exertion.  - Continue Anoro Ellipta  Constipation: chronic. - Miralax prn               H/o Non-small cell cancer of right lung: Thoracotomy 1/3 right lobe, chemo 5 years, radiation for 1 year, dx 2003   - Followed closely by [name], Pulmonology  Migraine: Has chronic headaches - Tylenol  650mg  prn for pain              Insomnia: Uses ambien 2-3 times per week, usually takes xanax twice per day - Continue home ambien as required for sleep          GAD. Chronic. Pt endorses feeling anxious in hospital. Asked for home xanax. Takes 1mg  two times a day prn.  -cont home xanax   -on outpatient follow up, may need additional controlled anxiety medication  FEN/GI: Regular diet Prophylaxis: Levonox  Disposition: pending cardiology evaluation (possible RHC)  Subjective:  Pt pleased that her leg swelling is signficantly improved. States no home O2 at baseline. Would like her xanax scheduled, explained that we cannot do this while we are actively treating a respiratory issue.   Objective: Temp:  [97.8 F (36.6 C)-98 F (36.7 C)] 97.9 F (36.6 C) (09/01 0450) Pulse Rate:  [103-117] 103 (09/01 0450) Resp:  [18] 18 (09/01 0450) BP: (106-111)/(77-92) 111/92 (09/01 0450) SpO2:  [92 %-95 %] 93 % (09/01 0450) Weight:  [152 lb 3.2 oz (69 kg)] 152 lb 3.2 oz (69 kg) (09/01 0450) Physical Exam: General: thin female resting in bed in NAD Cardiovascular: RRR, no mrg Respiratory: throughout all lung fields broncovesicular breath sounds, slight expiratory wheeze, no crackles Abdomen: soft, nontender, nondistended, +BS Extremities: no lower extremity edema, warm  Laboratory:  Recent Labs Lab 04/30/17 1627 05/01/17 0432 05/02/17 0347  WBC 7.6 7.2 4.9  HGB 13.1 13.6 13.2  HCT 38.9 40.2 40.2  PLT 216 200 186    Recent Labs Lab 04/30/17 1627  05/01/17 0432 05/02/17 0347  NA 138 139 140  K 3.6 3.1* 3.7  CL 105 103 107  CO2 22 27 25   BUN 6 6 6   CREATININE 0.77 0.78 0.76  CALCIUM 9.0 9.0 8.8*  GLUCOSE 85 92 98    Imaging/Diagnostic Tests: No new imaging.  Lori Hilding, MD 05/03/2017, 7:27 AM PGY-2, [place] Intern pager: [phone], text pages welcome

## 2017-05-03 NOTE — Progress Notes (Signed)
Subjective:  Denies any chest pain states breathing and her leg swelling has markedly improved.  Objective:  Vital Signs in the last 24 hours: Temp:  [97.8 F (36.6 C)-98 F (36.7 C)] 97.9 F (36.6 C) (09/01 0450) Pulse Rate:  [103-117] 103 (09/01 0450) Resp:  [18] 18 (09/01 0450) BP: (106-111)/(77-92) 111/92 (09/01 0450) SpO2:  [92 %-94 %] 94 % (09/01 0828) Weight:  [69 kg (152 lb 3.2 oz)] 69 kg (152 lb 3.2 oz) (09/01 0450)  Intake/Output from previous day: 08/31 0701 - 09/01 0700 In: 600 [P.O.:600] Out: 1800 [Urine:1800] Intake/Output from this shift: No intake/output data recorded.  Physical Exam: Neck: no adenopathy, no carotid bruit, no JVD and supple, symmetrical, trachea midline Lungs: Decreased breath sounds right base Heart: Tachycardic C1-Y6 soft 2/6 systolic murmur and S3 gallop noted noted Abdomen: soft, non-tender; bowel sounds normal; no masses,  no organomegaly Extremities: extremities normal, atraumatic, no cyanosis or edema  Lab Results:  Recent Labs  05/02/17 0347 05/03/17 0619  WBC 4.9 5.4  HGB 13.2 13.0  PLT 186 193    Recent Labs  05/02/17 0347 05/03/17 0619  NA 140 140  K 3.7 4.0  CL 107 105  CO2 25 25  GLUCOSE 98 88  BUN 6 5*  CREATININE 0.76 0.66    Recent Labs  05/01/17 0432 05/01/17 1002  TROPONINI 0.03* <0.03   Hepatic Function Panel No results for input(s): PROT, ALBUMIN, AST, ALT, ALKPHOS, BILITOT, BILIDIR, IBILI in the last 72 hours.  Recent Labs  05/02/17 1108  CHOL 155   No results for input(s): PROTIME in the last 72 hours.  Imaging: Imaging results have been reviewed and No results found.  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

## 2017-05-03 NOTE — Plan of Care (Signed)
Problem: Tissue Perfusion: Goal: Risk factors for ineffective tissue perfusion will decrease Outcome: Progressing Patient able to maintain oxygen level around 90% on room air, continues to wear oxygen for shortness of breath

## 2017-05-04 LAB — BASIC METABOLIC PANEL
ANION GAP: 14 (ref 5–15)
BUN: 12 mg/dL (ref 6–20)
CALCIUM: 9.8 mg/dL (ref 8.9–10.3)
CO2: 21 mmol/L — ABNORMAL LOW (ref 22–32)
Chloride: 103 mmol/L (ref 101–111)
Creatinine, Ser: 0.69 mg/dL (ref 0.44–1.00)
GFR calc Af Amer: >60 mL/min (ref >60)
Glucose, Bld: 89 mg/dL (ref 65–99)
POTASSIUM: 4.2 mmol/L (ref 3.5–5.1)
SODIUM: 138 mmol/L (ref 135–145)

## 2017-05-04 LAB — CBC
HCT: 40 % (ref 36.0–46.0)
Hemoglobin: 13.3 g/dL (ref 12.0–15.0)
MCH: 28.4 pg (ref 26.0–34.0)
MCHC: 33.3 g/dL (ref 30.0–36.0)
MCV: 85.5 fL (ref 78.0–100.0)
PLATELETS: 194 10*3/uL (ref 150–400)
RBC: 4.68 MIL/uL (ref 3.87–5.11)
RDW: 13.5 % (ref 11.5–15.5)
WBC: 6.1 10*3/uL (ref 4.0–10.5)

## 2017-05-04 MED ORDER — SENNA 8.6 MG PO TABS
1.0000 | ORAL_TABLET | Freq: Every day | ORAL | Status: DC
  Administered 2017-05-04 – 2017-05-13 (×10): 8.6 mg via ORAL
  Filled 2017-05-04 (×10): qty 1

## 2017-05-04 NOTE — Plan of Care (Signed)
Problem: Tissue Perfusion: Goal: Risk factors for ineffective tissue perfusion will decrease Outcome: Not Progressing Oxygen saturation 89-90% on room air, maintains sats in the 90's on 1 liter, still has dyspnea on exertion

## 2017-05-04 NOTE — Progress Notes (Signed)
Family Medicine Teaching Service Daily Progress Note Intern Pager: [phone]  Patient name: AVERYANNA SAX Medical record number: [id number] Date of birth: Apr 24, 1966 Age: 51 y.o. Gender: female  Primary Care Provider: Bonnita Hollow, MD Consultants: Pulmonology Code Status: Full  Pt Overview and Major Events to Date:  UYEN EICHHOLZ is a 51 y.o. female presenting with shortness of breath. PMH is significant for GAD, migraines, h/o non-small cell lung cancer  Assessment and Plan: Dyspnea 2/2 new onset CHF: Improved after dieuresis. Denies SOB at rest. BNP 679. HR 102. RR 18. O2 sats 94% on 1L [place]. Likely due to new onset CHF - echo with EF 20-25% with diffuse hypokinesis. CXR with minimal change from the previous examinations. Pt seems to have a baseline tachycardia around low 100s looking through prior charts. UOP 1.5L last 24 hours. Last lasix 04/30/17. a1c 4.9. LDL 89, triglycerides 154. Of note, spoke to Dr. Terrence Dupont today who states Dr. Doylene Canard plans [facility] on Tuesday, will continue to monitor BP and volume status. Could consider 12.5mg  aldactone per his recs if BP can tolerate. - cardiac monitoring - Continuous pulse ox - Incentive spirometry - Supplemental oxygen via  for O2 88-94% - Anoro per pulm - Albuterolq6 prn for breakthrough SOB, wheezing - Duoneb q4 - hold lasix, consider restarting if respiratory status worsens - atorvastatin 40mg  - metoprolol tartrate, should discharge home with succinate - cardiology following: agree with BB, consider aldactone as above, possible L/RHC 9/3; considering entresto in the future - daily BMP  COPD: Stable. SOB worsening on exertion.  - Continue Anoro Ellipta  Constipation: chronic. - Miralax prn               H/o Non-small cell cancer of right lung: Thoracotomy 1/3 right lobe, chemo 5 years, radiation for 1 year, dx 2003   - Followed closely by Dr. Baird Lyons, Pulmonology  Migraine: Has chronic headaches - Tylenol  650mg  prn for pain              Insomnia: Uses ambien 2-3 times per week, usually takes xanax twice per day - Continue home ambien as required for sleep          GAD. Chronic. Pt endorses feeling anxious in hospital. Asked for home xanax. Takes 1mg  two times a day prn.  -cont home xanax   -on outpatient follow up, may need additional controlled anxiety medication  Constipation: patient uses Miralax at home, last BM yesterday but feels constipated.  -patient to ask for PRN miralax  FEN/GI: Regular diet Prophylaxis: Levonox  Disposition: pending cardiology evaluation (possible RHC)  Subjective:  Pt would like to ambulate with her O2 on today. She feels well, hopeful that cath will go well. States she feels constipated.   Objective: Temp:  [97.6 F (36.4 C)-98.2 F (36.8 C)] 97.6 F (36.4 C) (09/02 0513) Pulse Rate:  [101-110] 110 (09/02 0900) Resp:  [18-20] 18 (09/02 0513) BP: (82-115)/(52-78) 82/52 (09/02 0900) SpO2:  [88 %-97 %] 88 % (09/02 0900) FiO2 (%):  [28 %] 28 % (09/02 0820) Weight:  [152 lb 4.8 oz (69.1 kg)] 152 lb 4.8 oz (69.1 kg) (09/02 0513) Physical Exam: General: thin female resting in bed in NAD Cardiovascular: RRR, no mrg Respiratory: throughout all lung fields broncovesicular breath sounds, slight expiratory wheeze, no crackles Abdomen: soft, nontender, nondistended, +BS Extremities: no lower extremity edema, warm  Laboratory:  Recent Labs Lab 05/02/17 0347 05/03/17 0619 05/04/17 0524  WBC 4.9 5.4 6.1  HGB 13.2 13.0  13.3  HCT 40.2 38.3 40.0  PLT 186 193 194    Recent Labs Lab 05/02/17 0347 05/03/17 0619 05/04/17 0524  NA 140 140 138  K 3.7 4.0 4.2  CL 107 105 103  CO2 25 25 21*  BUN 6 5* 12  CREATININE 0.76 0.66 0.69  CALCIUM 8.8* 9.1 9.8  GLUCOSE 98 88 89    Imaging/Diagnostic Tests: No new imaging.  Sela Hilding, MD 05/04/2017, 9:57 AM PGY-2, [place] Intern pager: [phone], text pages welcome

## 2017-05-04 NOTE — Progress Notes (Signed)
Patient stable during 7 a to 7 p shift, VSS, heart rate remains tachy in the 100-110s.  Did complain of back pain and indigestion after supper, immediately relieved with TUMS and Tylenol.  Patient continues to maintain oxygen saturation 88-90% on room air, up to mid 90's on 1 liter.  States shortness of breath with exertion is improved but still present at times.   Family at bedside.

## 2017-05-04 NOTE — Progress Notes (Signed)
Subjective:  Doing well denies any chest pain or shortness of breath. Complains of constipation.  Objective:  Vital Signs in the last 24 hours: Temp:  [97.6 F (36.4 C)-98.2 F (36.8 C)] 97.6 F (36.4 C) (09/02 0513) Pulse Rate:  [101-114] 105 (09/02 0513) Resp:  [18-20] 18 (09/02 0513) BP: (95-115)/(63-78) 110/78 (09/02 0513) SpO2:  [90 %-97 %] 97 % (09/02 0820) FiO2 (%):  [28 %] 28 % (09/02 0820) Weight:  [69.1 kg (152 lb 4.8 oz)] 69.1 kg (152 lb 4.8 oz) (09/02 0513)  Intake/Output from previous day: 09/01 0701 - 09/02 0700 In: 240 [P.O.:240] Out: 1300 [Urine:1300] Intake/Output from this shift: Total I/O In: 120 [P.O.:120] Out: -   Physical Exam: Neck: no adenopathy, no carotid bruit, no JVD and supple, symmetrical, trachea midline Lungs: clear to auscultation bilaterally Heart: regular rate and rhythm, S1, S2 normal and 2/6 systolic murmur and S3 gallop noted Abdomen: soft, non-tender; bowel sounds normal; no masses,  no organomegaly Extremities: extremities normal, atraumatic, no cyanosis or edema  Lab Results:  Recent Labs  05/03/17 0619 05/04/17 0524  WBC 5.4 6.1  HGB 13.0 13.3  PLT 193 194    Recent Labs  05/03/17 0619 05/04/17 0524  NA 140 138  K 4.0 4.2  CL 105 103  CO2 25 21*  GLUCOSE 88 89  BUN 5* 12  CREATININE 0.66 0.69    Recent Labs  05/01/17 1002  TROPONINI <0.03   Hepatic Function Panel No results for input(s): PROT, ALBUMIN, AST, ALT, ALKPHOS, BILITOT, BILIDIR, IBILI in the last 72 hours.  Recent Labs  05/02/17 1108  CHOL 155   No results for input(s): PROTIME in the last 72 hours.  Imaging: Imaging results have been reviewed and No results found.  Cardiac Studies:  Assessment/Plan:  Acute on chronic left systolic heart failure Minimally elevated troponin I secondary to above COPD History of non-small cell Ca of right lung status post lobectomy in 2003 History of adenocarcinoma of colon S/P skin  cancer Plan Continue present management Rx for constipation Dr. Doylene Canard to follow from a.m. Patient schedule for left and right heart cath on Tuesday  LOS: 4 days    Charolette Forward 05/04/2017, 9:11 AM

## 2017-05-05 MED ORDER — DICLOFENAC SODIUM 1 % TD GEL
2.0000 g | Freq: Three times a day (TID) | TRANSDERMAL | Status: DC | PRN
  Administered 2017-05-05: 2 g via TOPICAL
  Filled 2017-05-05: qty 100

## 2017-05-05 NOTE — Care Management Important Message (Signed)
Important Message  Patient Details  Name: [name] MRN: [id number] Date of Birth: 11/10/1965   Medicare Important Message Given:  Yes    Jareth Pardee 05/05/2017, 11:07 AM

## 2017-05-05 NOTE — Progress Notes (Signed)
Pt educated about safety and importance of bed alarm during the night however pt refuses to be on bed alarm. Will continue to round on patient.   Ruther Ephraim, RN    

## 2017-05-05 NOTE — Progress Notes (Signed)
Family Medicine Teaching Service Daily Progress Note Intern Pager: [phone]  Patient name: [name] Medical record number: [id number] Date of birth: 06/07/1966 Age: 51 y.o. Gender: female  Primary Care Provider: Bonnita Hollow, MD Consultants: Pulmonology Code Status: Full  Pt Overview and Major Events to Date:  [name] is a 51 y.o. female presenting with shortness of breath. PMH is significant for GAD, migraines, h/o non-small cell lung cancer  Assessment and Plan: Dyspnea 2/2 new onset CHF: Improved after dieuresis. Denies SOB at rest. BNP 679. HR 102. RR 18. O2 sats 97% on 1L [place]. Likely due to new onset CHF - echo with EF 20-25% with diffuse hypokinesis. CXR with minimal change from the previous examinations. Pt seems to have a baseline tachycardia around low 100s looking through prior charts. UOP 1.5L last 24 hours. Last lasix 04/30/17. a1c 4.9. LDL 89, triglycerides 154. Of note, spoke to Dr. Terrence Dupont today who states Dr. Doylene Canard plans [facility] on Tuesday, will continue to monitor BP and volume status. Could consider 12.5mg  aldactone per his recs if BP can tolerate. - cardiac monitoring - Continuous pulse ox - Incentive spirometry - Supplemental oxygen via [place] for O2 95-97% - wean as tolerated. - Anoro per pulm - Albuterolq6 prn for breakthrough SOB, wheezing - Duoneb q4 - hold lasix, consider restarting if respiratory status worsens - atorvastatin 40mg  - metoprolol tartrate, should discharge home with succinate - cardiology following: agree with BB, consider aldactone as above, possible L/RHC 9/4; considering entresto in the future - daily BMP  COPD: Stable. SOB worsening on exertion.  - Continue Anoro Ellipta  Constipation: chronic. - Miralax prn               H/o Non-small cell cancer of right lung: Thoracotomy 1/3 right lobe, chemo 5 years, radiation for 1 year, dx 2003   - Followed closely by Dr. Baird Lyons, Pulmonology  Migraine: Has chronic  headaches - Tylenol 650mg  prn for pain              Insomnia: Uses ambien 2-3 times per week, usually takes xanax twice per day - Continue home ambien as required for sleep          GAD. Chronic. Pt endorses feeling anxious in hospital. Asked for home xanax. Takes 1mg  two times a day prn.  -cont home xanax   -on outpatient follow up, may need additional controlled anxiety medication  Constipation: patient uses Miralax at home, last BM yesterday but feels constipated.  -patient to ask for PRN miralax  FEN/GI: Regular diet Prophylaxis: Levonox  Disposition: pending cardiology evaluation (possible RHC)  Subjective:  Walked without O2 this morning due to back pain, patient states it did not alleviate back pain but she was able to walk without SOB. Parents had many questions about cath procedure.   Objective: Temp:  [98.1 F (36.7 C)-99 F (37.2 C)] 98.1 F (36.7 C) (09/03 0432) Pulse Rate:  [97-115] 97 (09/03 0432) Resp:  [18] 18 (09/03 0432) BP: (101-131)/(62-90) 101/69 (09/03 0432) SpO2:  [94 %-97 %] 97 % (09/03 0709) Weight:  [68.2 kg (150 lb 6.4 oz)] 68.2 kg (150 lb 6.4 oz) (09/03 0432) Physical Exam: General: thin female resting in bed in NAD Cardiovascular: RRR, no mrg Respiratory: CTA bilaterally throughout all lung fields, no crackles/wheezes. No increased WOB on 1L [place]. Abdomen: soft, nontender, nondistended, +BS Extremities: no lower extremity edema, warm  Laboratory:  Recent Labs Lab 05/02/17 0347 05/03/17 0619 05/04/17 0524  WBC 4.9  5.4 6.1  HGB 13.2 13.0 13.3  HCT 40.2 38.3 40.0  PLT 186 193 194    Recent Labs Lab 05/02/17 0347 05/03/17 0619 05/04/17 0524  NA 140 140 138  K 3.7 4.0 4.2  CL 107 105 103  CO2 25 25 21*  BUN 6 5* 12  CREATININE 0.76 0.66 0.69  CALCIUM 8.8* 9.1 9.8  GLUCOSE 98 88 89    Imaging/Diagnostic Tests: No new imaging.  Rory Percy, DO 05/05/2017, 9:31 AM PGY-1, [place] Intern pager:  [phone], text pages welcome

## 2017-05-05 NOTE — Progress Notes (Signed)
Pt c/o 7/10 lower back pain unrelieved by K pad. Requests lidocaine patch. On call notified.

## 2017-05-05 NOTE — Progress Notes (Signed)
Ref: Bonnita Hollow, MD   Subjective:  Exertional dyspnea continues. VS stable. Afebrile. Mild to moderate respiratory distress continues..   Objective:  Vital Signs in the last 24 hours: Temp:  [98.1 F (36.7 C)-99 F (37.2 C)] 98.1 F (36.7 C) (09/03 0432) Pulse Rate:  [97-115] 97 (09/03 0432) Cardiac Rhythm: Sinus tachycardia (09/03 0930) Resp:  [18] 18 (09/03 0432) BP: (101-131)/(69-90) 101/69 (09/03 0432) SpO2:  [94 %-97 %] 97 % (09/03 0709) Weight:  [68.2 kg (150 lb 6.4 oz)] 68.2 kg (150 lb 6.4 oz) (09/03 0432)  Physical Exam: BP Readings from Last 1 Encounters:  05/05/17 101/69    Wt Readings from Last 1 Encounters:  05/05/17 68.2 kg (150 lb 6.4 oz)    Weight change: -0.862 kg (-1 lb 14.4 oz) Body mass index is 25.03 kg/m. HEENT: [place]/AT, Eyes-[place], PERL, EOMI, Conjunctiva-Pink, Sclera-Non-icteric Neck: No JVD, No bruit, Trachea midline. Lungs:  Clearing, Bilateral. Cardiac:  Regular rhythm, normal S1 and S2, no S3. II/VI systolic murmur. Abdomen:  Soft, non-tender. BS present. Extremities:  No edema present. No cyanosis. No clubbing. 2 + femoral and distal pulses, bilateral. CNS: AxOx3, Cranial nerves grossly intact, moves all 4 extremities.  Skin: Warm and dry.   Intake/Output from previous day: 09/02 0701 - 09/03 0700 In: 733 [P.O.:730; I.V.:3] Out: 3250 [Urine:3250]    Lab Results: BMET    Component Value Date/Time   NA 138 05/04/2017 0524   NA 140 05/03/2017 0619   NA 140 05/02/2017 0347   NA 141 12/11/2016 1106   NA 138 03/13/2016 1121   NA 142 11/07/2014 1045   K 4.2 05/04/2017 0524   K 4.0 05/03/2017 0619   K 3.7 05/02/2017 0347   K 3.5 12/11/2016 1106   K 3.9 03/13/2016 1121   K 3.6 11/07/2014 1045   CL 103 05/04/2017 0524   CL 105 05/03/2017 0619   CL 107 05/02/2017 0347   CL 111 (H) 07/09/2012 0958   CL 100 07/09/2011 1413   CO2 21 (L) 05/04/2017 0524   CO2 25 05/03/2017 0619   CO2 25 05/02/2017 0347   CO2 22 12/11/2016 1106    CO2 26 03/13/2016 1121   CO2 24 11/07/2014 1045   GLUCOSE 89 05/04/2017 0524   GLUCOSE 88 05/03/2017 0619   GLUCOSE 98 05/02/2017 0347   GLUCOSE 84 12/11/2016 1106   GLUCOSE 87 03/13/2016 1121   GLUCOSE 75 11/07/2014 1045   GLUCOSE 80 07/09/2012 0958   GLUCOSE 96 07/09/2011 1413   BUN 12 05/04/2017 0524   BUN 5 (L) 05/03/2017 0619   BUN 6 05/02/2017 0347   BUN 8.1 12/11/2016 1106   BUN 10.7 03/13/2016 1121   BUN 8.7 11/07/2014 1045   CREATININE 0.69 05/04/2017 0524   CREATININE 0.66 05/03/2017 0619   CREATININE 0.76 05/02/2017 0347   CREATININE 0.8 12/11/2016 1106   CREATININE 0.8 03/13/2016 1121   CREATININE 0.8 11/07/2014 1045   CALCIUM 9.8 05/04/2017 0524   CALCIUM 9.1 05/03/2017 0619   CALCIUM 8.8 (L) 05/02/2017 0347   CALCIUM 9.0 12/11/2016 1106   CALCIUM 8.8 03/13/2016 1121   CALCIUM 8.9 11/07/2014 1045   GFRNONAA >60 05/04/2017 0524   GFRNONAA >60 05/03/2017 0619   GFRNONAA >60 05/02/2017 0347   GFRAA >60 05/04/2017 0524   GFRAA >60 05/03/2017 0619   GFRAA >60 05/02/2017 0347   CBC    Component Value Date/Time   WBC 6.1 05/04/2017 0524   RBC 4.68 05/04/2017 0524   HGB 13.3  05/04/2017 0524   HGB 13.6 12/11/2016 1106   HCT 40.0 05/04/2017 0524   HCT 39.1 12/11/2016 1106   PLT 194 05/04/2017 0524   PLT 221 12/11/2016 1106   MCV 85.5 05/04/2017 0524   MCV 84.4 12/11/2016 1106   MCH 28.4 05/04/2017 0524   MCHC 33.3 05/04/2017 0524   RDW 13.5 05/04/2017 0524   RDW 13.5 12/11/2016 1106   LYMPHSABS 1.0 04/30/2017 1627   LYMPHSABS 1.3 12/11/2016 1106   MONOABS 0.5 04/30/2017 1627   MONOABS 0.4 12/11/2016 1106   EOSABS 0.0 04/30/2017 1627   EOSABS 0.1 12/11/2016 1106   BASOSABS 0.0 04/30/2017 1627   BASOSABS 0.1 12/11/2016 1106   HEPATIC Function Panel  Recent Labs  12/11/16 1106  PROT 7.5   HEMOGLOBIN A1C No components found for: HGA1C,  MPG CARDIAC ENZYMES Lab Results  Component Value Date   TROPONINI <0.03 05/01/2017   TROPONINI 0.03 (HH)  05/01/2017   TROPONINI 0.04 (HH) 04/30/2017   BNP No results for input(s): PROBNP in the last 8760 hours. TSH  Recent Labs  04/30/17 2125  TSH 1.695   CHOLESTEROL  Recent Labs  05/02/17 1108  CHOL 155    Scheduled Meds: . atorvastatin  40 mg Oral q1800  . enoxaparin (LOVENOX) injection  40 mg Subcutaneous Q24H  . losartan  25 mg Oral Daily  . metoprolol tartrate  25 mg Oral TID  . senna  1 tablet Oral Daily  . sodium chloride flush  3 mL Intravenous Q12H  . umeclidinium-vilanterol  1 puff Inhalation Daily   Continuous Infusions: . sodium chloride     PRN Meds:.sodium chloride, acetaminophen **OR** acetaminophen, albuterol, ALPRAZolam, calcium carbonate, diclofenac sodium, ipratropium-albuterol, polyethylene glycol, sodium chloride flush, zolpidem  Assessment/Plan: Acute on chronic left heart systolic function Abnormal troponin I from demand ischemia COPD History of non-small cell cancer of the lung status post lobectomy in 2003 History of adenocarcinoma of colon Status post skin cancer  Awaiting right and left heart catheterization tomorrow.   LOS: 5 days    [name]  MD  05/05/2017, 12:09 PM

## 2017-05-06 ENCOUNTER — Encounter (HOSPITAL_COMMUNITY): Payer: Self-pay | Admitting: Cardiovascular Disease

## 2017-05-06 ENCOUNTER — Encounter (HOSPITAL_COMMUNITY)
Admission: EM | Disposition: A | Discharge status: Home / Self Care | Payer: Self-pay | Source: Home / Self Care | Attending: Family Medicine

## 2017-05-06 ENCOUNTER — Other Ambulatory Visit: Payer: Self-pay | Admitting: *Deleted

## 2017-05-06 DIAGNOSIS — I2510 Atherosclerotic heart disease of native coronary artery without angina pectoris: Secondary | ICD-10-CM

## 2017-05-06 DIAGNOSIS — M545 Low back pain, unspecified: Secondary | ICD-10-CM

## 2017-05-06 DIAGNOSIS — I25110 Atherosclerotic heart disease of native coronary artery with unstable angina pectoris: Secondary | ICD-10-CM

## 2017-05-06 HISTORY — PX: RIGHT/LEFT HEART CATH AND CORONARY ANGIOGRAPHY: CATH118266

## 2017-05-06 LAB — CBC
HEMATOCRIT: 41.7 % (ref 36.0–46.0)
HEMOGLOBIN: 14 g/dL (ref 12.0–15.0)
MCH: 28.6 pg (ref 26.0–34.0)
MCHC: 33.6 g/dL (ref 30.0–36.0)
MCV: 85.3 fL (ref 78.0–100.0)
Platelets: 193 10*3/uL (ref 150–400)
RBC: 4.89 MIL/uL (ref 3.87–5.11)
RDW: 13.4 % (ref 11.5–15.5)
WBC: 6.5 10*3/uL (ref 4.0–10.5)

## 2017-05-06 LAB — POCT I-STAT 3, VENOUS BLOOD GAS (G3P V)
Acid-Base Excess: 4 mmol/L — ABNORMAL HIGH (ref 0.0–2.0)
Bicarbonate: 30.2 mmol/L — ABNORMAL HIGH (ref 20.0–28.0)
O2 SAT: 48 %
PCO2 VEN: 49.6 mmHg (ref 44.0–60.0)
PO2 VEN: 27 mmHg — AB (ref 32.0–45.0)
TCO2: 32 mmol/L (ref 22–32)
pH, Ven: 7.392 (ref 7.250–7.430)

## 2017-05-06 LAB — POCT I-STAT 3, ART BLOOD GAS (G3+)
Acid-Base Excess: 4 mmol/L — ABNORMAL HIGH (ref 0.0–2.0)
BICARBONATE: 27.9 mmol/L (ref 20.0–28.0)
O2 Saturation: 91 %
PCO2 ART: 39.9 mmHg (ref 32.0–48.0)
PH ART: 7.452 — AB (ref 7.350–7.450)
PO2 ART: 58 mmHg — AB (ref 83.0–108.0)
TCO2: 29 mmol/L (ref 22–32)

## 2017-05-06 LAB — BASIC METABOLIC PANEL
ANION GAP: 9 (ref 5–15)
BUN: 15 mg/dL (ref 6–20)
CO2: 24 mmol/L (ref 22–32)
Calcium: 9.2 mg/dL (ref 8.9–10.3)
Chloride: 104 mmol/L (ref 101–111)
Creatinine, Ser: 0.67 mg/dL (ref 0.44–1.00)
GFR calc Af Amer: >60 mL/min (ref >60)
Glucose, Bld: 87 mg/dL (ref 65–99)
POTASSIUM: 4.4 mmol/L (ref 3.5–5.1)
SODIUM: 137 mmol/L (ref 135–145)

## 2017-05-06 LAB — PROTIME-INR
INR: 0.98
Prothrombin Time: 12.9 seconds (ref 11.4–15.2)

## 2017-05-06 SURGERY — RIGHT/LEFT HEART CATH AND CORONARY ANGIOGRAPHY
Anesthesia: LOCAL

## 2017-05-06 MED ORDER — SODIUM CHLORIDE 0.9 % IV SOLN: INTRAVENOUS | Status: AC

## 2017-05-06 MED ORDER — MIDAZOLAM HCL 2 MG/2ML IJ SOLN
INTRAMUSCULAR | Status: AC
  Filled 2017-05-06: qty 2

## 2017-05-06 MED ORDER — FENTANYL CITRATE (PF) 100 MCG/2ML IJ SOLN
INTRAMUSCULAR | Status: DC | PRN
Start: 2017-05-06 — End: 2017-05-06
  Administered 2017-05-06: 25 ug via INTRAVENOUS

## 2017-05-06 MED ORDER — SODIUM CHLORIDE 0.9% FLUSH: 3.0000 mL | INTRAVENOUS | Status: DC | PRN

## 2017-05-06 MED ORDER — LIDOCAINE HCL (PF) 1 % IJ SOLN
INTRAMUSCULAR | Status: DC | PRN
  Administered 2017-05-06: 25 mL via INTRADERMAL

## 2017-05-06 MED ORDER — FENTANYL CITRATE (PF) 100 MCG/2ML IJ SOLN
INTRAMUSCULAR | Status: AC
  Filled 2017-05-06: qty 2

## 2017-05-06 MED ORDER — HEPARIN (PORCINE) IN NACL 2-0.9 UNIT/ML-% IJ SOLN
INTRAMUSCULAR | Status: AC | PRN
  Administered 2017-05-06: 1000 mL

## 2017-05-06 MED ORDER — SODIUM CHLORIDE 0.9 % IV SOLN: 250.0000 mL | INTRAVENOUS | Status: DC | PRN

## 2017-05-06 MED ORDER — SODIUM CHLORIDE 0.9 % IV SOLN
INTRAVENOUS | Status: DC
  Administered 2017-05-06: 09:00:00 via INTRAVENOUS

## 2017-05-06 MED ORDER — IOPAMIDOL (ISOVUE-370) INJECTION 76%
INTRAVENOUS | Status: DC | PRN
  Administered 2017-05-06: 45 mL via INTRA_ARTERIAL

## 2017-05-06 MED ORDER — SODIUM CHLORIDE 0.9% FLUSH
3.0000 mL | Freq: Two times a day (BID) | INTRAVENOUS | Status: DC
  Administered 2017-05-07 – 2017-05-12 (×9): 3 mL via INTRAVENOUS

## 2017-05-06 MED ORDER — ONDANSETRON HCL 4 MG/2ML IJ SOLN
4.0000 mg | Freq: Four times a day (QID) | INTRAMUSCULAR | Status: DC | PRN
  Administered 2017-05-12: 4 mg via INTRAVENOUS
  Filled 2017-05-06: qty 2

## 2017-05-06 MED ORDER — HEPARIN (PORCINE) IN NACL 100-0.45 UNIT/ML-% IJ SOLN
1100.0000 [IU]/h | INTRAMUSCULAR | Status: DC
  Administered 2017-05-06: 800 [IU]/h via INTRAVENOUS
  Administered 2017-05-07 – 2017-05-08 (×2): 1250 [IU]/h via INTRAVENOUS
  Administered 2017-05-09 – 2017-05-11 (×3): 1100 [IU]/h via INTRAVENOUS
  Filled 2017-05-06 (×6): qty 250

## 2017-05-06 MED ORDER — SODIUM CHLORIDE 0.9% FLUSH
3.0000 mL | Freq: Two times a day (BID) | INTRAVENOUS | Status: DC
  Administered 2017-05-06: 3 mL via INTRAVENOUS

## 2017-05-06 MED ORDER — IOPAMIDOL (ISOVUE-370) INJECTION 76%
INTRAVENOUS | Status: AC
  Filled 2017-05-06: qty 100

## 2017-05-06 MED ORDER — LIDOCAINE HCL (PF) 1 % IJ SOLN
INTRAMUSCULAR | Status: AC
  Filled 2017-05-06: qty 30

## 2017-05-06 MED ORDER — MIDAZOLAM HCL 2 MG/2ML IJ SOLN
INTRAMUSCULAR | Status: DC | PRN
  Administered 2017-05-06: 0.5 mg via INTRAVENOUS

## 2017-05-06 MED ORDER — ASPIRIN 81 MG PO CHEW
81.0000 mg | CHEWABLE_TABLET | ORAL | Status: AC
  Administered 2017-05-06: 81 mg via ORAL
  Filled 2017-05-06: qty 1

## 2017-05-06 SURGICAL SUPPLY — 11 items
CATH INFINITI 5FR MULTPACK ANG (CATHETERS) ×2 IMPLANT
CATH SWAN GANZ 7F STRAIGHT (CATHETERS) ×2 IMPLANT
KIT HEART LEFT (KITS) ×2 IMPLANT
PACK CARDIAC CATHETERIZATION (CUSTOM PROCEDURE TRAY) ×2 IMPLANT
SHEATH PINNACLE 5F 10CM (SHEATH) ×2 IMPLANT
SHEATH PINNACLE 7F 10CM (SHEATH) ×2 IMPLANT
TRANSDUCER W/STOPCOCK (MISCELLANEOUS) ×4 IMPLANT
TUBING ART PRESS 72  MALE/FEM (TUBING) ×1
TUBING ART PRESS 72 MALE/FEM (TUBING) ×1 IMPLANT
WIRE EMERALD 3MM-J .025X260CM (WIRE) ×2 IMPLANT
WIRE EMERALD 3MM-J .035X150CM (WIRE) ×2 IMPLANT

## 2017-05-06 NOTE — Progress Notes (Signed)
Family Medicine Teaching Service Daily Progress Note Intern Pager: [phone]  Patient name: [name] Medical record number: [id number] Date of birth: [birth year] Age: 51 y.o. Gender: female  Primary Care Provider: Bonnita Hollow, MD Consultants: Pulmonology Code Status: Full  Pt Overview and Major Events to Date:  [name] is a 51 y.o. female presenting with shortness of breath. PMH is significant for GAD, migraines, h/o non-small cell lung cancer  Assessment and Plan: Dyspnea 2/2 new onset CHF: Improved after dieuresis. Denies SOB at rest. BNP 679. HR 94. RR 18. O2 sats 94-96% on room air. Likely due to new onset CHF - echo with EF 20-25% with diffuse hypokinesis. CXR with minimal change from the previous examinations. Pt seems to have a baseline tachycardia around low 100s looking through prior charts. UOP 2.3L last 24 hours. Last lasix 04/30/17. a1c 4.9. LDL 89, triglycerides 154. Trop on 8/29 0.04 > 0.03. Patient to get [facility] today, will continue to monitor BP and volume status. Consider 12.5mg  aldactone per cards recs if BP can tolerate. - cardiac monitoring - Continuous pulse ox - Incentive spirometry - Supplemental oxygen via [place] for O2 95-97% - wean as tolerated. - Anoro per pulm - Albuterolq6 prn for breakthrough SOB, wheezing - Duoneb q4 - hold lasix, consider restarting if respiratory status worsens - atorvastatin 40mg  - metoprolol tartrate, should discharge home with succinate - cardiology following: agree with BB, consider aldactone as above, L/RHC 9/4; considering entresto in the future  COPD: Stable. SOB worsening on exertion.  - Continue Anoro Ellipta  Constipation: chronic. Had 2 BMs yesterday of normal caliber. - Miralax prn               H/o Non-small cell cancer of right lung: Thoracotomy 1/3 right lobe, chemo 5 years, radiation for 1 year, dx 2003   - Followed closely by Dr. Baird Lyons, Pulmonology  Migraine: Has chronic headaches -  Tylenol 650mg  prn for pain              Insomnia: Uses ambien 2-3 times per week, usually takes xanax twice per day - Continue home ambien as required for sleep          GAD. Chronic. Pt endorses feeling anxious in hospital. Asked for home xanax. Takes 1mg  two times a day prn.  -cont home xanax   -on outpatient follow up, may need additional controlled anxiety medication  FEN/GI: NPO prior to [facility]; Regular diet after Prophylaxis: Lovenox, held for [facility]  Disposition: pending cardiology evaluation [facility])  Subjective:  Pt endorses no SOB or chest pain at rest or with walking. Parents had no concerns.  Objective: Temp:  [97.2 F (36.2 C)-97.8 F (36.6 C)] 97.6 F (36.4 C) (09/04 0800) Pulse Rate:  [90-95] 94 (09/04 0800) Resp:  [18] 18 (09/04 0800) BP: (92-105)/(54-69) 94/62 (09/04 0800) SpO2:  [91 %-96 %] 94 % (09/04 0924) Weight:  [151 lb 3.2 oz (68.6 kg)] 151 lb 3.2 oz (68.6 kg) (09/04 0525)   Net UOP 2.3L in last 24 hours. Sating mid 90s on room air. BP at 0800 94/62.  Physical Exam: General: thin female resting in bed in NAD Cardiovascular: RRR, no mrg Respiratory: CTA bilaterally throughout all lung fields, no crackles/wheezes. No increased WOB on room air. Abdomen: soft, nontender, nondistended, +BS Extremities: no lower extremity edema, warm  Laboratory:  Recent Labs Lab 05/02/17 0347 05/03/17 0619 05/04/17 0524  WBC 4.9 5.4 6.1  HGB 13.2 13.0 13.3  HCT 40.2 38.3 40.0  PLT 186 193 194    Recent Labs Lab 05/02/17 0347 05/03/17 0619 05/04/17 0524  NA 140 140 138  K 3.7 4.0 4.2  CL 107 105 103  CO2 25 25 21*  BUN 6 5* 12  CREATININE 0.76 0.66 0.69  CALCIUM 8.8* 9.1 9.8  GLUCOSE 98 88 89    Imaging/Diagnostic Tests: No new imaging.  Rory Percy, DO 05/06/2017, 9:34 AM PGY-1, [place] Intern pager: [phone], text pages welcome

## 2017-05-06 NOTE — Progress Notes (Signed)
I stopped in to speak to [name] regarding her new HF diagnosis.  She is currently awaiting a surgical consult after completion of today cardiac catheterization.  I did briefly review the HF diagnosis and recommendations for home.  I will return after plan has been determined.

## 2017-05-06 NOTE — Final Progress Note (Signed)
Site area: RT GROIN   ARTERIAL/VENOUS Site Prior to Removal:  Level 0 Pressure Applied For:22 MINUTES Manual:   YES Patient Status During Pull:  AWAKE Post Pull Site:  Level 0 Post Pull Instructions Given:  YES Post Pull Pulses Present: RT DP PAPABLE Dressing Applied:  YES Bedrest begins @ 14:30 Comments:

## 2017-05-06 NOTE — Progress Notes (Signed)
Patient has been NPO after midnight.  Weaned patient to RA this morning, Oxygen 94%.on RA.  No complaints or concerns during the night shift. Will continue to monitor.     Raimi Guillermo, RN

## 2017-05-06 NOTE — Progress Notes (Signed)
Old dressing removed from site. New dressing placed. Site currently without complication. Patient stable, no complaints. Site clean,dry intact, no bleeding or hematoma. Will continue to monitor.  Skylur Fuston, RN

## 2017-05-06 NOTE — Progress Notes (Signed)
ANTICOAGULATION CONSULT NOTE - Initial Consult  Pharmacy Consult for heparin Indication: chest pain/ACS  No Known Allergies  Patient Measurements: Height: 5\' 5"  (165.1 cm) Weight: 151 lb 3.2 oz (68.6 kg) (scale c) IBW/kg (Calculated) : 57 Heparin Dosing Weight: 68 Kg  Vital Signs: Temp: 97.9 F (36.6 C) (09/04 1119) Temp Source: Oral (09/04 1119) BP: 87/60 (09/04 1444) Pulse Rate: 92 (09/04 1444)  Labs:  Recent Labs  05/04/17 0524 05/06/17 1106  HGB 13.3 14.0  HCT 40.0 41.7  PLT 194 193  LABPROT  --  12.9  INR  --  0.98  CREATININE 0.69 0.67   Estimated Creatinine Clearance: 80 mL/min (by C-G formula based on SCr of 0.67 mg/dL).  Medical History: Past Medical History:  Diagnosis Date  . 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 Allergic rhinitis   . Allergy   . Anxiety disorder   . Asthma   . Chronic bronchitis   . Colon cancer ([place])    adenocarcinoma in a polyps 11-24-2014  . COPD (chronic obstructive pulmonary disease) ([place])   . Cough   . Dizziness and giddiness   . Dyspepsia   . Emphysema of lung ([place])   . External hemorrhoids   . EXTERNAL HEMORRHOIDS 09/27/2009  . Female stress incontinence   . GERD (gastroesophageal reflux disease)    occ depending on diet   . Goiter   . History of lung cancer   . Hypercholesteremia    denies-last check normal labs  . 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 Insomnia   . Lung cancer ([place])   . Migraine, unspecified, without mention of intractable migraine without mention of status migrainosus   . Panic attacks   . Seizures ([place])    very long ago like 15 years ago or so , questionable etilogy  . Skin cancer (melanoma) ([place])    right calf   Medications:  Scheduled:  . atorvastatin  40 mg Oral q1800  . losartan  25 mg Oral Daily  . metoprolol tartrate  25 mg Oral TID  . senna  1 tablet Oral Daily  . sodium chloride flush  3 mL Intravenous Q12H  . sodium chloride flush  3 mL Intravenous  Q12H  . umeclidinium-vilanterol  1 puff Inhalation Daily    Assessment: Lori Zecca Tallentis a 50 y.o.females/p cardiac catheterization. No anticoagulation PTA. CBC WNL. Pharmacy consulted to start heparin 8 hours post sheath removal. RN reports the sheath was removed in the cath lab.   Goal of Therapy:  Heparin level 0.3-0.7 units/ml Monitor platelets by anticoagulation protocol: Yes   Plan:  Start heparin infusion at 800 units/hr @ 2230 tonight Check anti-Xa level in 6 hours and daily while on heparin Continue to monitor H&H and platelets  [name] [name] 05/06/2017,3:07 PM

## 2017-05-06 NOTE — Progress Notes (Signed)
Pt with light red blood on right groin gauze, level 3 with no hematoma.  MD seen and stated it looked good.  Will continue to monitor.

## 2017-05-06 NOTE — H&P (View-Only) (Signed)
Ref: Bonnita Hollow, MD   Subjective:  Exertional dyspnea continues. VS stable. Afebrile. Mild to moderate respiratory distress continues..   Objective:  Vital Signs in the last 24 hours: Temp:  [98.1 F (36.7 C)-99 F (37.2 C)] 98.1 F (36.7 C) (09/03 0432) Pulse Rate:  [97-115] 97 (09/03 0432) Cardiac Rhythm: Sinus tachycardia (09/03 0930) Resp:  [18] 18 (09/03 0432) BP: (101-131)/(69-90) 101/69 (09/03 0432) SpO2:  [94 %-97 %] 97 % (09/03 0709) Weight:  [68.2 kg (150 lb 6.4 oz)] 68.2 kg (150 lb 6.4 oz) (09/03 0432)  Physical Exam: BP Readings from Last 1 Encounters:  05/05/17 101/69    Wt Readings from Last 1 Encounters:  05/05/17 68.2 kg (150 lb 6.4 oz)    Weight change: -0.862 kg (-1 lb 14.4 oz) Body mass index is 25.03 kg/m. HEENT: [place]/AT, Eyes-Hazel, PERL, EOMI, Conjunctiva-Pink, Sclera-Non-icteric Neck: No JVD, No bruit, Trachea midline. Lungs:  Clearing, Bilateral. Cardiac:  Regular rhythm, normal S1 and S2, no S3. II/VI systolic murmur. Abdomen:  Soft, non-tender. BS present. Extremities:  No edema present. No cyanosis. No clubbing. 2 + femoral and distal pulses, bilateral. CNS: AxOx3, Cranial nerves grossly intact, moves all 4 extremities.  Skin: Warm and dry.   Intake/Output from previous day: 09/02 0701 - 09/03 0700 In: 733 [P.O.:730; I.V.:3] Out: 3250 [Urine:3250]    Lab Results: BMET    Component Value Date/Time   NA 138 05/04/2017 0524   NA 140 05/03/2017 0619   NA 140 05/02/2017 0347   NA 141 12/11/2016 1106   NA 138 03/13/2016 1121   NA 142 11/07/2014 1045   K 4.2 05/04/2017 0524   K 4.0 05/03/2017 0619   K 3.7 05/02/2017 0347   K 3.5 12/11/2016 1106   K 3.9 03/13/2016 1121   K 3.6 11/07/2014 1045   CL 103 05/04/2017 0524   CL 105 05/03/2017 0619   CL 107 05/02/2017 0347   CL 111 (H) 07/09/2012 0958   CL 100 07/09/2011 1413   CO2 21 (L) 05/04/2017 0524   CO2 25 05/03/2017 0619   CO2 25 05/02/2017 0347   CO2 22 12/11/2016 1106   CO2 26 03/13/2016 1121   CO2 24 11/07/2014 1045   GLUCOSE 89 05/04/2017 0524   GLUCOSE 88 05/03/2017 0619   GLUCOSE 98 05/02/2017 0347   GLUCOSE 84 12/11/2016 1106   GLUCOSE 87 03/13/2016 1121   GLUCOSE 75 11/07/2014 1045   GLUCOSE 80 07/09/2012 0958   GLUCOSE 96 07/09/2011 1413   BUN 12 05/04/2017 0524   BUN 5 (L) 05/03/2017 0619   BUN 6 05/02/2017 0347   BUN 8.1 12/11/2016 1106   BUN 10.7 03/13/2016 1121   BUN 8.7 11/07/2014 1045   CREATININE 0.69 05/04/2017 0524   CREATININE 0.66 05/03/2017 0619   CREATININE 0.76 05/02/2017 0347   CREATININE 0.8 12/11/2016 1106   CREATININE 0.8 03/13/2016 1121   CREATININE 0.8 11/07/2014 1045   CALCIUM 9.8 05/04/2017 0524   CALCIUM 9.1 05/03/2017 0619   CALCIUM 8.8 (L) 05/02/2017 0347   CALCIUM 9.0 12/11/2016 1106   CALCIUM 8.8 03/13/2016 1121   CALCIUM 8.9 11/07/2014 1045   GFRNONAA >60 05/04/2017 0524   GFRNONAA >60 05/03/2017 0619   GFRNONAA >60 05/02/2017 0347   GFRAA >60 05/04/2017 0524   GFRAA >60 05/03/2017 0619   GFRAA >60 05/02/2017 0347   CBC    Component Value Date/Time   WBC 6.1 05/04/2017 0524   RBC 4.68 05/04/2017 0524   HGB 13.3 05/04/2017  0524   HGB 13.6 12/11/2016 1106   HCT 40.0 05/04/2017 0524   HCT 39.1 12/11/2016 1106   PLT 194 05/04/2017 0524   PLT 221 12/11/2016 1106   MCV 85.5 05/04/2017 0524   MCV 84.4 12/11/2016 1106   MCH 28.4 05/04/2017 0524   MCHC 33.3 05/04/2017 0524   RDW 13.5 05/04/2017 0524   RDW 13.5 12/11/2016 1106   LYMPHSABS 1.0 04/30/2017 1627   LYMPHSABS 1.3 12/11/2016 1106   MONOABS 0.5 04/30/2017 1627   MONOABS 0.4 12/11/2016 1106   EOSABS 0.0 04/30/2017 1627   EOSABS 0.1 12/11/2016 1106   BASOSABS 0.0 04/30/2017 1627   BASOSABS 0.1 12/11/2016 1106   HEPATIC Function Panel  Recent Labs  12/11/16 1106  PROT 7.5   HEMOGLOBIN A1C No components found for: HGA1C,  MPG CARDIAC ENZYMES Lab Results  Component Value Date   TROPONINI <0.03 05/01/2017   TROPONINI 0.03 (HH)  05/01/2017   TROPONINI 0.04 (HH) 04/30/2017   BNP No results for input(s): PROBNP in the last 8760 hours. TSH  Recent Labs  04/30/17 2125  TSH 1.695   CHOLESTEROL  Recent Labs  05/02/17 1108  CHOL 155    Scheduled Meds: . atorvastatin  40 mg Oral q1800  . enoxaparin (LOVENOX) injection  40 mg Subcutaneous Q24H  . losartan  25 mg Oral Daily  . metoprolol tartrate  25 mg Oral TID  . senna  1 tablet Oral Daily  . sodium chloride flush  3 mL Intravenous Q12H  . umeclidinium-vilanterol  1 puff Inhalation Daily   Continuous Infusions: . sodium chloride     PRN Meds:.sodium chloride, acetaminophen **OR** acetaminophen, albuterol, ALPRAZolam, calcium carbonate, diclofenac sodium, ipratropium-albuterol, polyethylene glycol, sodium chloride flush, zolpidem  Assessment/Plan: Acute on chronic left heart systolic function Abnormal troponin I from demand ischemia COPD History of non-small cell cancer of the lung status post lobectomy in 2003 History of adenocarcinoma of colon Status post skin cancer  Awaiting right and left heart catheterization tomorrow.   LOS: 5 days    [name]  MD  05/05/2017, 12:09 PM

## 2017-05-06 NOTE — Consult Note (Signed)
[address]       [place] [address]             [phone]        Re[facility] Y Fischel [place] Medical Record #[id number] Date of Birth: [birth year]  Referring: Doylene Canard Pulmonary: Dr Glynn Octave Primary Care: Bonnita Hollow, MD  Chief Complaint:   CAD, Shortness of breath  History of Present Illness:      [name] is a 51 yo white female with known history of Anxiety disorder, hypercholesterolemia, and Migraine.  She had non-small cell lung cancer in 2003.  She underwent right upper lobectomy for 1.8 cm adenocarcinoma with positive 10r an 4r nodesp T1c,p N2,cM0 Stage IIIA.  by Dr. Arlyce Dice, followed by chemotherapy and radiation.  She has a history of colon cancer S/P resection in 2016 and Melanoma on the back of her right leg.  She has a 20 year smoking history of 2.5 ppd, which she quit after her long cancer diagnosis.  Unfortunately she has severe underlying COPD with Fev1 of 1.64 obtained is 2017.  Her presenting complaint was shortness of breath.  She states that she is not really able to walk from the bed to the door without becoming winded.  She is not very active due to this.  Her episodes of shortness of breath are also accompanied by chest pressure.  She noticed she was swollen over the past several days and presented to the ED on 04/30/2017 for evaluation.  CTA of the chest was obtained and was negative for pulmonary embolism.  There was bilateral ground glass opacities concerning for pulmonary edema. There was reflux of contrast media into the hepatic vein consistent with right heart insufficiency.  There was no evidence pulmonary nodules or metastatic disease.  Her EKG was WNL, with negative troponin levels.  BNP was also obtained and was mildly elevated at 679.  She was admitted by the medicine service for diuresis and treatment of exacerbation of COPD.  Echocardiogram was obtained and showed no valvular abnormalities, but her EF was significantly reduced to  20-25%.  Cardiology consult was obtained to agreed with diuretics therapy, but ultimately felt cardiac catheterization would be indicated.  This was completed today and showed 60% LM disease.  She again re-iterated her complaints of shortness of breath, specifically with activity.  She denies chest pain.  Currently she complains of headache and overall feeling of fatigue and weakness which she attributes to being hospitalized for the past several days.    Current Activity/ Functional Status: Patient is independent with mobility/ambulation, transfers, ADL's, IADL's.   Zubrod Score: At the time of surgery this patient's most appropriate activity status/level should be described as: []     0    Normal activity, no symptoms []     1    Restricted in physical strenuous activity but ambulatory, able to do out light work [x]     2    Ambulatory and capable of self care, unable to do work activities, up and about                 more than 50%  Of the time                            []     3    Only limited self care, in bed greater than 50% of waking hours []     4  Completely disabled, no self care, confined to bed or chair []     5    Moribund  Past Medical History:  Diagnosis Date  . 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 Allergic rhinitis   . Allergy   . Anxiety disorder   . Asthma   . Chronic bronchitis   . Colon cancer ([place])    adenocarcinoma in a polyps 11-24-2014  . COPD (chronic obstructive pulmonary disease) ([place])   . Cough   . Dizziness and giddiness   . Dyspepsia   . Emphysema of lung ([place])   . External hemorrhoids   . EXTERNAL HEMORRHOIDS 09/27/2009  . Female stress incontinence   . GERD (gastroesophageal reflux disease)    occ depending on diet   . Goiter   . History of lung cancer   . Hypercholesteremia    denies-last check normal labs  . 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 Insomnia   . Lung cancer ([place])   . Migraine, unspecified, without mention  of intractable migraine without mention of status migrainosus   . Panic attacks   . Seizures ([place])    very long ago like 15 years ago or so , questionable etilogy  . Skin cancer (melanoma) ([place])    right calf    Past Surgical History:  Procedure Laterality Date  . biopsy of right neck mass     negative-thyroid biopsy   . BREAST EXCISIONAL BIOPSY Right 1987   no visible scar  . BRONCHOSCOPY  01/31/2005   nonspecific inflammation  . BRONCHOSCOPY  08/24/2010   nonspecific inflammation  . COLONOSCOPY    . LUNG REMOVAL, PARTIAL     for lung cancer--followed by Dr. Arlyce Dice and Dr. Annamaria Boots  . MELANOMA EXCISION WITH SENTINEL LYMPH NODE BIOPSY Right 11/29/2014   Procedure: RIGHT INGUINAL SENTINEL LYMPH NODE BIOPSY;  Surgeon: Georganna Skeans, MD;  Location: [facility];  Service: General;  Laterality: Right;  . melanoma removal     right calf   . POLYPECTOMY    . portacath removed  10/2007  . RIGHT/LEFT HEART CATH AND CORONARY ANGIOGRAPHY N/A 05/06/2017   Procedure: RIGHT/LEFT HEART CATH AND CORONARY ANGIOGRAPHY;  Surgeon: [name], MD;  Location: [facility];  Service: Cardiovascular;  Laterality: N/A;  . SPIROMETRY  07/03/2002   min obstruction,mild restriction 01/31/2005  . tvh  12/01/2000   hysterectomy    History  Smoking Status  . Former Smoker  . Packs/day: 2.00  . Years: 23.00  . Types: Cigarettes  . Start date: 09/02/1978  . Quit date: 10/26/2001  Smokeless Tobacco  . Never Used    Comment: Significant passive exposure from boy friend    History  Alcohol Use No    Comment: history of alcohol abuse 20-30    Social History   Social History  . Marital status: Widowed    Spouse name: N/A  . Number of children: 1  . Years of education: 10   Occupational History  . Disabled- waitress Unemployed   Social History Main Topics  . Smoking status: Former Smoker    Packs/day: 2.00    Years: 23.00    Types: Cigarettes    Start date: 09/02/1978    Quit date: 10/26/2001  .  Smokeless tobacco: Never Used     Comment: Significant passive exposure from boy friend  . Alcohol use No     Comment: history of alcohol abuse 20-30  . Drug use: No     Comment: +MJ  .  Sexual activity: Not on file   Other Topics Concern  . Not on file   Social History Narrative   Health Care POA:    Emergency Contact:    End of Life Plan:    Who lives with you: self   Any pets: cat, Bart   Diet: Pt has a varied diet but reports not eating much because she is afraid of weight gain.   Exercise: Pt has no regular exercise routine.   Seatbelts: Pt reports wearing seatbelt when in vehicles.    Hobbies: movies          No Known Allergies  Current Facility-Administered Medications  Medication Dose Route Frequency Provider Last Rate Last Dose  . 0.9 %  sodium chloride infusion  250 mL Intravenous PRN Pike Bing, DO      . 0.9 %  sodium chloride infusion   Intravenous Continuous [name], MD 50 mL/hr at 05/06/17 1445    . 0.9 %  sodium chloride infusion  250 mL Intravenous PRN [name], MD      . acetaminophen (TYLENOL) tablet 650 mg  650 mg Oral Q6H PRN [place] Bing, DO   650 mg at 05/06/17 1037   Or  . acetaminophen (TYLENOL) suppository 650 mg  650 mg Rectal Q6H PRN [place] Bing, DO      . albuterol (PROVENTIL) (2.5 MG/3ML) 0.083% nebulizer solution 2.5 mg  2.5 mg Nebulization Q6H PRN [place] Bing, DO      . ALPRAZolam Duanne Moron) tablet 1 mg  1 mg Oral BID PRN Bonnita Hollow, MD   1 mg at 05/06/17 1034  . atorvastatin (LIPITOR) tablet 40 mg  40 mg Oral q1800 [name], MD   40 mg at 05/05/17 1701  . calcium carbonate (TUMS - dosed in mg elemental calcium) chewable tablet 600 mg of elemental calcium  3 tablet Oral Daily PRN [place] Bing, DO   600 mg of elemental calcium at 05/04/17 1705  . heparin ADULT infusion 100 units/mL (25000 units/281mL sodium chloride 0.45%)  800 Units/hr Intravenous Continuous Rudisill, Jodean Lima, RPH      .  ipratropium-albuterol (DUONEB) 0.5-2.5 (3) MG/3ML nebulizer solution 3 mL  3 mL Nebulization Q6H PRN Dickie La, MD      . losartan (COZAAR) tablet 25 mg  25 mg Oral Daily [name], MD   25 mg at 05/06/17 1026  . metoprolol tartrate (LOPRESSOR) tablet 25 mg  25 mg Oral TID Charolette Forward, MD   25 mg at 05/06/17 1027  . ondansetron (ZOFRAN) injection 4 mg  4 mg Intravenous Q6H PRN [name], MD      . polyethylene glycol (MIRALAX / GLYCOLAX) packet 17 g  17 g Oral Daily PRN [place] Bing, DO   17 g at 05/05/17 0911  . senna (SENOKOT) tablet 8.6 mg  1 tablet Oral Daily Sela Hilding, MD   8.6 mg at 05/06/17 1027  . sodium chloride flush (NS) 0.9 % injection 3 mL  3 mL Intravenous Q12H [place] Bing, DO   3 mL at 05/06/17 1000  . sodium chloride flush (NS) 0.9 % injection 3 mL  3 mL Intravenous PRN [place] Bing, DO      . sodium chloride flush (NS) 0.9 % injection 3 mL  3 mL Intravenous Q12H [name], MD      . sodium chloride flush (NS) 0.9 % injection 3 mL  3 mL Intravenous PRN [name], MD      .  umeclidinium-vilanterol (ANORO ELLIPTA) 62.5-25 MCG/INH 1 puff  1 puff Inhalation Daily [place] Bing, DO   1 puff at 05/06/17 9371  . zolpidem (AMBIEN) tablet 5 mg  5 mg Oral QHS PRN [place] Bing, DO   5 mg at 05/02/17 0005    Facility-Administered Medications Prior to Admission  Medication Dose Route Frequency Provider Last Rate Last Dose  . 0.9 %  sodium chloride infusion  500 mL Intravenous Continuous Armbruster, Carlota Raspberry, MD       Prescriptions Prior to Admission  Medication Sig Dispense Refill Last Dose  . acetaminophen (TYLENOL) 650 MG CR tablet Take 650 mg by mouth every 8 (eight) hours as needed for pain.   prn  . albuterol (PROAIR HFA) 108 (90 Base) MCG/ACT inhaler Inhale 2 puffs every 6 hours as needed 3 Inhaler 3 prn  . albuterol (PROVENTIL) (2.5 MG/3ML) 0.083% nebulizer solution Take 3 mLs (2.5 mg total) by nebulization every 6 (six) hours  as needed for wheezing or shortness of breath. 75 vial prn prn  . ALPRAZolam (XANAX) 1 MG tablet Take 1 tablet (1 mg total) by mouth 2 (two) times daily as needed for anxiety. 60 tablet 5 prn  . Calcium Carbonate Antacid (MAALOX PO) Take 1 tablet by mouth as needed (heartburn).   prn  . polyethylene glycol powder (GLYCOLAX/MIRALAX) powder Take 17 g by mouth 2 (two) times daily as needed. 225 g 5 prn  . PROAIR RESPICLICK 696 (90 Base) MCG/ACT AEPB INHALE 2 PUFFS INTO THE LUNGS EVERY 6 HOURS AS NEEDED. 1 each 4 prn  . umeclidinium-vilanterol (ANORO ELLIPTA) 62.5-25 MCG/INH AEPB Inhale 1 puff into the lungs daily. 2 each 0 04/30/2017 at Unknown time  . zolpidem (AMBIEN) 5 MG tablet 1 at bedtime for sleep as needed (Patient taking differently: Take 5 mg by mouth at bedtime as needed for sleep. ) 30 tablet 5 prn  . fluticasone furoate-vilanterol (BREO ELLIPTA) 100-25 MCG/INH AEPB Inhale 1 puff into the lungs daily. Rinse after each use (Patient not taking: Reported on 04/30/2017) 3 each 3 Not Taking at Unknown time    Family History  Problem Relation Age of Onset  . Lung cancer Father        smoker and worked at a [employer]  . COPD Mother        not a smoker  . Other Mother        hx of hysterectomy for unspecified reason  . Asthma Daughter   . Diabetes Maternal Grandfather   . Colon polyps Brother        approx 16 polyps on his first colonoscopy  . Other Maternal Aunt        non-cancerous growth in lungs; respiratory issues; smoker  . COPD Maternal Grandmother        d. 45  . Cancer Paternal Grandfather        oral/mouth cancer; chewed tobacco; d. older age  . Throat cancer Other        maternal great aunt (MGF's sister); not a smoker  . Cirrhosis Paternal Uncle        hx of alcohol abuse   . Colon cancer Neg Hx   . Rectal cancer Neg Hx   . Stomach cancer Neg Hx    Review of Systems:  Pertinent items are noted in HPI.     Cardiac Review of Systems: Y or N  Chest Pain Aqua.Slicker    ]    Resting SOB [  y ]  Exertional SOB  Blue.Reese  ]  [place].Latina [  ]   Pedal Edema Blue.Reese   ]    Palpitations [  ] Syncope  [ n ]   Presyncope [ n  ]  General Review of Systems: [Y] = yes [  ]=no Constitional: recent weight change [  ]; anorexia [  ]; fatigue [ y ]; nausea Aqua.Slicker  ]; night sweats [  ]; fever [  ]; or chills [  ]                                                               Dental: poor dentition[  ]; Last Dentist visit:   Eye : blurred vision [  ]; diplopia [   ]; vision changes [  ];  Amaurosis fugax[  ]; Resp: cough [ y ];  wheezing[ y ];  hemoptysis[  ]; shortness of breath[ y ]; paroxysmal nocturnal dyspnea[  ]; dyspnea on exertion[y  ]; or orthopnea[  ];  GI:  gallstones[  ], vomiting[  ];  dysphagia[  ]; melena[  ];  hematochezia [  ]; heartburn[  ];   Hx of  Colonoscopy[  ]; GU: kidney stones [  ]; hematuria[  ];   dysuria [  ];  nocturia[  ];  history of     obstruction [  ]; urinary frequency [  ]             Skin: rash, swelling[  ];, hair loss[  ];  peripheral edema[y  ];  or itching[  ]; Musculosketetal: myalgias[  ];  joint swelling[  ];  joint erythema[  ];  joint pain[  ];  back pain[  ];  Heme/Lymph: bruising[  ];  bleeding[  ];  anemia[  ];  Neuro: TIA[  ];  headaches[ y ];  stroke[  ];  vertigo[  ];  seizures[  ];   paresthesias[  ];  difficulty walking[  ];  Psych:depression[  ]; Sylvester Harder  ];  Endocrine: diabetes[ n ];  thyroid dysfunction[  ];  Immunizations: Flu [  ]; Pneumococcal[  ];  Other:  Physical Exam: BP (!) 87/60 (BP Location: Left Arm)   Pulse 92   Temp 97.9 F (36.6 C) (Oral)   Resp 20   Ht 5\' 5"  (1.651 m)   Wt 151 lb 3.2 oz (68.6 kg) Comment: scale c  SpO2 90%   BMI 25.16 kg/m   General appearance: alert, cooperative and no distress Head: Normocephalic, without obvious abnormality, atraumatic Lymph nodes: Cervical, supraclavicular, and axillary nodes normal. Resp: diminished breath sounds bibasilar Cardio: regular rate and rhythm GI: soft,  non-tender; bowel sounds normal; no masses,  no organomegaly Extremities: extremities normal, atraumatic, no cyanosis or edema Neurologic: Grossly normal Right femoral artery cath site, no hematoma  Lower extremity vein suitable for cabg   Diagnostic Studies & Laboratory data:     Recent Radiology Findings:   Cardiac Catheterization:    Ost LM to LM lesion, 60 %stenosed.  Prox RCA lesion, 15 %stenosed.  LV end diastolic pressure is normal.   Left subclavian injection showed patent LIMA.  I have independently reviewed the above radiologic studies.  Recent Lab Findings: Lab Results  Component Value Date   WBC 6.5 05/06/2017  HGB 14.0 05/06/2017   HCT 41.7 05/06/2017   PLT 193 05/06/2017   GLUCOSE 87 05/06/2017   CHOL 155 05/02/2017   TRIG 154 (H) 05/02/2017   HDL 35 (L) 05/02/2017   LDLDIRECT 137 (H) 09/27/2009   LDLCALC 89 05/02/2017   ALT 9 12/11/2016   AST 13 12/11/2016   NA 137 05/06/2017   K 4.4 05/06/2017   CL 104 05/06/2017   CREATININE 0.67 05/06/2017   BUN 15 05/06/2017   CO2 24 05/06/2017   TSH 1.695 04/30/2017   INR 0.98 05/06/2017   HGBA1C 4.9 05/02/2017    Assessment / Plan:    1. CAD- LM disease 60%- patient is chest pain free, shortness of breath is chronic likely combination of underlying COPD and congestive heart failure 2. Pulm- severe underlying COPD, H/O Lung cancer S/P Lobectomy... Last Fev1 was 1.64-- will need repeat PFTs 3. GAD- per medicine 4. CV- CHF, diuretics per medicine, cardiology 5. Dispo- with severe underlying COPD, patient may not be a candidate to proceed with bypass surgery, if anything she will be considered a high risk candidate.  Dr. Servando Snare is aware of patient and will follow up with further recommendations  Patients lv dysfunction seems out of proportion coronary disease. Class III-IV heart failure, Class B. Unsure of the hemodynamic significance of angiographic findings. Before proceeding with considering CABG suggest  evaluation of Heart failure team, update PFT's .   I  spent 40 minutes counseling the patient face to face and 50% or more the  time was spent in counseling and coordination of care. The total time spent in the appointment was 60 minutes.    Lori Isaac MD      [address] [place], [address] Office [phone]   [place]

## 2017-05-06 NOTE — Interval H&P Note (Signed)
History and Physical Interval Note:  05/06/2017 12:08 PM  [name]  has presented today for surgery, with the diagnosis of unstable angina  The various methods of treatment have been discussed with the patient and family. After consideration of risks, benefits and other options for treatment, the patient has consented to  Procedure([name]): RIGHT/LEFT HEART CATH AND CORONARY ANGIOGRAPHY (N/A) as a surgical intervention .  The patient'[name] history has been reviewed, patient examined, no change in status, stable for surgery.  I have reviewed the patient'[name] chart and labs.  Questions were answered to the patient'[name] satisfaction.     [name]

## 2017-05-07 ENCOUNTER — Other Ambulatory Visit (HOSPITAL_COMMUNITY): Payer: Medicare Other

## 2017-05-07 DIAGNOSIS — I5021 Acute systolic (congestive) heart failure: Secondary | ICD-10-CM

## 2017-05-07 LAB — CBC
HEMATOCRIT: 41.5 % (ref 36.0–46.0)
Hemoglobin: 13.9 g/dL (ref 12.0–15.0)
MCH: 28.5 pg (ref 26.0–34.0)
MCHC: 33.5 g/dL (ref 30.0–36.0)
MCV: 85.2 fL (ref 78.0–100.0)
PLATELETS: 203 10*3/uL (ref 150–400)
RBC: 4.87 MIL/uL (ref 3.87–5.11)
RDW: 13.2 % (ref 11.5–15.5)
WBC: 6.4 10*3/uL (ref 4.0–10.5)

## 2017-05-07 LAB — BASIC METABOLIC PANEL
Anion gap: 6 (ref 5–15)
BUN: 14 mg/dL (ref 6–20)
CALCIUM: 9.1 mg/dL (ref 8.9–10.3)
CO2: 26 mmol/L (ref 22–32)
CREATININE: 0.64 mg/dL (ref 0.44–1.00)
Chloride: 104 mmol/L (ref 101–111)
GFR calc non Af Amer: >60 mL/min (ref >60)
Glucose, Bld: 84 mg/dL (ref 65–99)
Potassium: 4.5 mmol/L (ref 3.5–5.1)
SODIUM: 136 mmol/L (ref 135–145)

## 2017-05-07 LAB — HEPARIN LEVEL (UNFRACTIONATED)
HEPARIN UNFRACTIONATED: 0.17 [IU]/mL — AB (ref 0.30–0.70)
Heparin Unfractionated: 0.19 IU/mL — ABNORMAL LOW (ref 0.30–0.70)
Heparin Unfractionated: 0.37 IU/mL (ref 0.30–0.70)

## 2017-05-07 MED ORDER — CARVEDILOL 3.125 MG PO TABS
3.1250 mg | ORAL_TABLET | Freq: Two times a day (BID) | ORAL | Status: DC
Start: 2017-05-07 — End: 2017-05-13
  Administered 2017-05-07 – 2017-05-13 (×11): 3.125 mg via ORAL
  Filled 2017-05-07 (×13): qty 1

## 2017-05-07 NOTE — Progress Notes (Signed)
[place] for heparin Indication: chest pain/ACS  No Known Allergies  Patient Measurements: Height: 5\' 5"  (165.1 cm) Weight: 150 lb 9.6 oz (68.3 kg) (scale c) IBW/kg (Calculated) : 57 Heparin Dosing Weight: 68 Kg  Vital Signs: Temp: 98 F (36.7 C) (09/05 0408) Temp Source: Oral (09/05 0408) BP: 101/67 (09/05 0408) Pulse Rate: 93 (09/05 0408)  Labs:  Recent Labs  05/06/17 1106 05/07/17 0530  HGB 14.0 13.9  HCT 41.7 41.5  PLT 193 203  LABPROT 12.9  --   INR 0.98  --   HEPARINUNFRC  --  0.17*  CREATININE 0.67 0.64   Estimated Creatinine Clearance: 74 mL/min (by C-G formula based on SCr of 0.64 mg/dL).  Assessment: 51 y.o. female with CAD s/p cath, awaiting possible CABG, for heparin  Goal of Therapy:  Heparin level 0.3-0.7 units/ml Monitor platelets by anticoagulation protocol: Yes   Plan:  Increase Heparin 1000 units/hr Check heparin level in 8 hours.   Caryl Pina 05/07/2017,6:24 AM

## 2017-05-07 NOTE — Progress Notes (Signed)
[place] for heparin Indication: chest pain/ACS  No Known Allergies  Patient Measurements: Height: 5\' 5"  (165.1 cm) Weight: 150 lb 9.6 oz (68.3 kg) (scale c) IBW/kg (Calculated) : 57 Heparin Dosing Weight: 68 Kg  Assessment: 51 y.o. female with CAD s/p cath, awaiting possible CABG, for heparin. Last heparin level remains low at 0.19 after rate increase. CBC stable.  Goal of Therapy:  Heparin level 0.3-0.7 units/ml Monitor platelets by anticoagulation protocol: Yes   Plan:  Increase heparin gtt to 1250 units/hr Monitor daily heparin level, CBC, s/s of bleed  [name], PharmD, [facility] Clinical Pharmacist Pager [phone] 05/07/2017 4:08 PM

## 2017-05-07 NOTE — Progress Notes (Signed)
[place] for heparin Indication: chest pain/ACS  No Known Allergies  Patient Measurements: Height: 5\' 5"  (165.1 cm) Weight: 150 lb 9.6 oz (68.3 kg) (scale c) IBW/kg (Calculated) : 57 Heparin Dosing Weight: 68 Kg  Assessment: 51 y.o. female with CAD s/p cath, awaiting possible CABG, for heparin.   Heparin level therapeutic at 0.37. CBC stable and no bleeding noted.   Goal of Therapy:  Heparin level 0.3-0.7 units/ml Monitor platelets by anticoagulation protocol: Yes   Plan:  Continue heparin gtt at 1250 units/hr Confirm level with AM labs Daily heparin level and CBC Monitor for s/s bleeding F/u cards plan   Argie Ramming, PharmD Clinical Pharmacist 05/07/17 11:14 PM

## 2017-05-07 NOTE — Progress Notes (Signed)
Patient slept during the night. Site without complication. Dry, no bleeding or hematoma present.   Will contine to monitor.  Cristian Davitt, RN

## 2017-05-07 NOTE — Progress Notes (Signed)
Following pt. Please advise on ambulation (significant dyspnea with ambulation, significant LM). Also awaiting plan. Yves Dill CES, ACSM 3:49 PM 05/07/2017

## 2017-05-07 NOTE — Consult Note (Signed)
Advanced Heart Failure Team Consult Note   Primary Physician: Primary Cardiologist:  None Pulmonary: Dr Annamaria Boots Onocology: Dr Jana Hakim  Reason for Consultation: Heart Failure   HPI:    [name] is seen today for evaluation of heart failure at the request of Dr Servando Snare.   [name] is a 51 year old with a history of non small cell lung cancer (treated with RUL wedge resection,  chemo and radiation), emphysema, melanoma RLE ,colon cancer, and  former smoker quit 2001.   Last Wednesday she was evaluated by pulmonary due and was hypoxic. O2 sats < 88% so home oxygen ordered. Sent for CT of chest.  CT chest -->concerning for pulmonary edema, no metastatic recurrent and increased cardiomegaly.    Admitted with increased dyspnea. Pertinent admission labs included: K 3.6, creatinine 0.77, BNP 679, troponin 0.04, 0.03, 0.03,and hgb 13. CXR with edema on admit. Started on nebs. Also diuresed with IV lasix. ECHO was completed and showed reduced EF so cardiology consulted. LHC RHC completed with results below. CT surgery consulted and considering high risk bypass surgery.   Today she denies CP. Denies SOB.   LHC [facility] 05/06/2017 RA 5 PAP 44/22 Mean 30 PCWP 7 Fick CO/CI 2.86/1/62  Ost LM to LM lesion, 60 %stenosed.  Prox RCA lesion, 15 %stenosed.  LV end diastolic pressure is normal.  ECHO 05/02/2017 Left ventricle: LVEF is severely depressed at approximately 20 to   25% with diffuse hypokinesis; inferior, inferoseptal akinesis.   The cavity size was mildly dilated. Wall thickness was normal.   Systolic function was severely reduced. The estimated ejection   fraction was in the range of 20% to 25%. - Aortic valve: There was mild to moderate regurgitation. - Mitral valve: There was mild regurgitation. - Right ventricle: The cavity size was mildly dilated. Systolic   function was moderately reduced. - Right atrium: The atrium was mildly dilated.  Review of Systems: [y] = yes,  [ ]  = no   General: Weight gain [ ] ; Weight loss [ Y]; Anorexia [ ] ; Fatigue [ ] ; Fever [ ] ; Chills [ ] ; Weakness [ ]   Cardiac: Chest pain/pressure [ ] ; Resting SOB [ ] ; Exertional SOB [Y ]; Orthopnea [ ] ; Pedal Edema [ ] ; Palpitations [ ] ; Syncope [ ] ; Presyncope [ ] ; Paroxysmal nocturnal dyspnea[ ]   Pulmonary: Cough [ ] ; Wheezing[ ] ; Hemoptysis[ ] ; Sputum [ ] ; Snoring [ ]   GI: Vomiting[ ] ; Dysphagia[ ] ; Melena[ ] ; Hematochezia [ ] ; Heartburn[ ] ; Abdominal pain [ ] ; Constipation [ ] ; Diarrhea [ ] ; BRBPR [ ]   GU: Hematuria[ ] ; Dysuria [ ] ; Nocturia[ ]   Vascular: Pain in legs with walking [ ] ; Pain in feet with lying flat [ ] ; Non-healing sores [ ] ; Stroke [ ] ; TIA [ ] ; Slurred speech [ ] ;  Neuro: Headaches[ ] ; Vertigo[ ] ; Seizures[ ] ; Paresthesias[ ] ;Blurred vision [ ] ; Diplopia [ ] ; Vision changes [ ]   Ortho/Skin: Arthritis [ ] ; Joint pain [Y ]; Muscle pain [ ] ; Joint swelling [ ] ; Back Pain [Y ]; Rash [ ]   Psych: Depression[ ] ; Anxiety[ ]   Heme: Bleeding problems [ ] ; Clotting disorders [ ] ; Anemia [ ]   Endocrine: Diabetes [ ] ; Thyroid dysfunction[ ]   Home Medications Prior to Admission medications   Medication Sig Start Date End Date Taking? Authorizing Provider  acetaminophen (TYLENOL) 650 MG CR tablet Take 650 mg by mouth every 8 (eight) hours as needed for pain.   Yes [provider]  albuterol (PROAIR  HFA) 108 (90 Base) MCG/ACT inhaler Inhale 2 puffs every 6 hours as needed 04/30/17  Yes Young, Clinton D, MD  albuterol (PROVENTIL) (2.5 MG/3ML) 0.083% nebulizer solution Take 3 mLs (2.5 mg total) by nebulization every 6 (six) hours as needed for wheezing or shortness of breath. 06/28/16  Yes Young, Tarri Fuller D, MD  ALPRAZolam Duanne Moron) 1 MG tablet Take 1 tablet (1 mg total) by mouth 2 (two) times daily as needed for anxiety. 12/16/16  Yes Ronnie Doss M, DO  Calcium Carbonate Antacid (MAALOX PO) Take 1 tablet by mouth as needed (heartburn).   Yes [provider]    polyethylene glycol powder (GLYCOLAX/MIRALAX) powder Take 17 g by mouth 2 (two) times daily as needed. 12/16/16  Yes Gottschalk, Ashly M, DO  PROAIR RESPICLICK 657 (90 Base) MCG/ACT AEPB INHALE 2 PUFFS INTO THE LUNGS EVERY 6 HOURS AS NEEDED. 10/01/16  Yes Young, Clinton D, MD  umeclidinium-vilanterol (ANORO ELLIPTA) 62.5-25 MCG/INH AEPB Inhale 1 puff into the lungs daily. 04/30/17  Yes Young, Tarri Fuller D, MD  zolpidem (AMBIEN) 5 MG tablet 1 at bedtime for sleep as needed Patient taking differently: Take 5 mg by mouth at bedtime as needed for sleep.  04/30/17  Yes Young, Clinton D, MD  fluticasone furoate-vilanterol (BREO ELLIPTA) 100-25 MCG/INH AEPB Inhale 1 puff into the lungs daily. Rinse after each use Patient not taking: Reported on 04/30/2017 10/30/16   Deneise Lever, MD    Past Medical History: Past Medical History:  Diagnosis Date  . 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 Allergic rhinitis   . Allergy   . Anxiety disorder   . Asthma   . Chronic bronchitis   . 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 COPD (chronic obstructive pulmonary disease) ([place])   . Cough   . Dizziness and giddiness   . Dyspepsia   . Emphysema of lung ([place])   . External hemorrhoids   . EXTERNAL HEMORRHOIDS 09/27/2009  . Female stress incontinence   . GERD (gastroesophageal reflux disease)    occ depending on diet   . Goiter   . History of lung cancer   . Hypercholesteremia    denies-last check normal labs  . 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 Insomnia   . Lung cancer ([place])   . Migraine, unspecified, without mention of intractable migraine without mention of status migrainosus   . Panic attacks   . Seizures ([place])    very long ago like 15 years ago or so , questionable etilogy  . Skin cancer (melanoma) ([place])    right calf    Past Surgical History: Past Surgical History:  Procedure Laterality Date  . biopsy of right neck mass     negative-thyroid  biopsy   . BREAST EXCISIONAL BIOPSY Right 1987   no visible scar  . BRONCHOSCOPY  01/31/2005   nonspecific inflammation  . BRONCHOSCOPY  08/24/2010   nonspecific inflammation  . COLONOSCOPY    . LUNG REMOVAL, PARTIAL     for lung cancer--followed by Dr. Arlyce Dice and Dr. Annamaria Boots  . MELANOMA EXCISION WITH SENTINEL LYMPH NODE BIOPSY Right 11/29/2014   Procedure: RIGHT INGUINAL SENTINEL LYMPH NODE BIOPSY;  Surgeon: Georganna Skeans, MD;  Location: [place];  Service: General;  Laterality: Right;  . melanoma removal     right calf   . POLYPECTOMY    . portacath removed  10/2007  . RIGHT/LEFT HEART CATH AND CORONARY ANGIOGRAPHY N/A 05/06/2017   Procedure:  RIGHT/LEFT HEART CATH AND CORONARY ANGIOGRAPHY;  Surgeon: [name], MD;  Location: [facility];  Service: Cardiovascular;  Laterality: N/A;  . SPIROMETRY  07/03/2002   min obstruction,mild restriction 01/31/2005  . tvh  12/01/2000   hysterectomy    Family History: Family History  Problem Relation Age of Onset  . Lung cancer Father        smoker and worked at a Academic libr[employer]  . COPD Mother        not a smoker  . Other Mother        hx of hysterectomy for unspecified reason  . Asthma Daughter   . Diabetes Maternal Grandfather   . Colon polyps Brother        approx 16 polyps on his first colonoscopy  . Other Maternal Aunt        non-cancerous growth in lungs; respiratory issues; smoker  . COPD Maternal Grandmother        d. 37  . Cancer Paternal Grandfather        oral/mouth cancer; chewed tobacco; d. older age  . Throat cancer Other        maternal great aunt (MGF's sister); not a smoker  . Cirrhosis Paternal Uncle        hx of alcohol abuse   . Colon cancer Neg Hx   . Rectal cancer Neg Hx   . Stomach cancer Neg Hx     Social History: Social History   Social History  . Marital status: Widowed    Spouse name: N/A  . Number of children: 1  . Years of education: 10   Occupational History  . Disabled- waitress Unemployed    Social History Main Topics  . Smoking status: Former Smoker    Packs/day: 2.00    Years: 23.00    Types: Cigarettes    Start date: 09/02/1978    Quit date: 10/26/2001  . Smokeless tobacco: Never Used     Comment: Significant passive exposure from boy friend  . Alcohol use No     Comment: history of alcohol abuse 20-30  . Drug use: No     Comment: +MJ  . Sexual activity: Not Asked   Other Topics Concern  . None   Social History Narrative   Health Care POA:    Emergency Contact:    End of Life Plan:    Who lives with you: self   Any pets: cat, Bart   Diet: Pt has a varied diet but reports not eating much because she is afraid of weight gain.   Exercise: Pt has no regular exercise routine.   Seatbelts: Pt reports wearing seatbelt when in vehicles.    Hobbies: movies          Allergies:  No Known Allergies  Objective:    Vital Signs:   Temp:  [97.6 F (36.4 C)-98.2 F (36.8 C)] 98 F (36.7 C) (09/05 0408) Pulse Rate:  [0-100] 93 (09/05 0755) Resp:  [0-74] 18 (09/05 0755) BP: (76-116)/(47-76) 101/67 (09/05 0408) SpO2:  [0 %-96 %] 93 % (09/05 0755) Weight:  [150 lb 9.6 oz (68.3 kg)] 150 lb 9.6 oz (68.3 kg) (09/05 0408) Last BM Date: 05/05/17  Weight change: Filed Weights   05/05/17 0432 05/06/17 0525 05/07/17 0408  Weight: 150 lb 6.4 oz (68.2 kg) 151 lb 3.2 oz (68.6 kg) 150 lb 9.6 oz (68.3 kg)    Intake/Output:   Intake/Output Summary (Last 24 hours) at 05/07/17 1027 Last data filed  at 05/07/17 0600  Gross per 24 hour  Intake            545.2 ml  Output             2100 ml  Net          -1554.8 ml      Physical Exam    General:  Well appearing. No resp difficulty. In bed  HEENT: normal Neck: supple. JVP 5-6. Carotids 2+ bilat; no bruits. No lymphadenopathy or thyromegaly appreciated. Cor: PMI nondisplaced. Regular rate & rhythm. No rubs, gallops or murmurs. Lungs: clear on room air Abdomen: soft, nontender, nondistended. No hepatosplenomegaly. No  bruits or masses. Good bowel sounds. Extremities: no cyanosis, clubbing, rash, edema Neuro: alert & orientedx3, cranial nerves grossly intact. moves all 4 extremities w/o difficulty. Affect pleasant   Telemetry   NSR 90s QRS 92 Lori personally reviewed.   EKG    Sinus Tach 113 bpm  Labs   Basic Metabolic Panel:  Recent Labs Lab 05/02/17 0347 05/03/17 0619 05/04/17 0524 05/06/17 1106 05/07/17 0530  NA 140 140 138 137 136  K 3.7 4.0 4.2 4.4 4.5  CL 107 105 103 104 104  CO2 25 25 21* 24 26  GLUCOSE 98 88 89 87 84  BUN 6 5* 12 15 14   CREATININE 0.76 0.66 0.69 0.67 0.64  CALCIUM 8.8* 9.1 9.8 9.2 9.1    Liver Function Tests: No results for input(s): AST, ALT, ALKPHOS, BILITOT, PROT, ALBUMIN in the last 168 hours. No results for input(s): LIPASE, AMYLASE in the last 168 hours. No results for input(s): AMMONIA in the last 168 hours.  CBC:  Recent Labs Lab 04/30/17 1627  05/02/17 0347 05/03/17 0619 05/04/17 0524 05/06/17 1106 05/07/17 0530  WBC 7.6  < > 4.9 5.4 6.1 6.5 6.4  NEUTROABS 6.0  --   --   --   --   --   --   HGB 13.1  < > 13.2 13.0 13.3 14.0 13.9  HCT 38.9  < > 40.2 38.3 40.0 41.7 41.5  MCV 86.4  < > 85.9 85.7 85.5 85.3 85.2  PLT 216  < > 186 193 194 193 203  < > = values in this interval not displayed.  Cardiac Enzymes:  Recent Labs Lab 04/30/17 2125 05/01/17 0432 05/01/17 1002  TROPONINI 0.04* 0.03* <0.03    BNP: BNP (last 3 results)  Recent Labs  04/30/17 1627 05/01/17 0432  BNP 679.1* 656.4*    ProBNP (last 3 results) No results for input(s): PROBNP in the last 8760 hours.   CBG: No results for input(s): GLUCAP in the last 168 hours.  Coagulation Studies:  Recent Labs  05/06/17 1106  LABPROT 12.9  INR 0.98     Imaging    No results found.   Medications:     Current Medications: . atorvastatin  40 mg Oral q1800  . losartan  25 mg Oral Daily  . metoprolol tartrate  25 mg Oral TID  . senna  1 tablet Oral Daily    . sodium chloride flush  3 mL Intravenous Q12H  . sodium chloride flush  3 mL Intravenous Q12H  . umeclidinium-vilanterol  1 puff Inhalation Daily     Infusions: . sodium chloride    . sodium chloride    . heparin 1,000 Units/hr (05/07/17 1443)       Patient Profile    [name] is a 51 year old with a history of non small cell  lung cancer treated with chemo and radiation, emphysema, melanoma RLE ,colon cancer, and  former smoker admitted with increased dyspnea.   LHC with LM disease. Cardiac Surgery requested HF Team consult.    Assessment/Plan   1. Acute Systolic Heart Failure ECHO 05/02/2017 EF 20-25%.  RHC reviewed. Fick CO/CI low but appears well compensated. Euvolemic.  Off diuretics.  Renal function stable.  Stop lopressor. Start carvedilol 3.125 mg twice a day.  Add digoxin 0.125 mg daily.  Continue losartan.  2. CAD- LHC with LM disease 60%. ? Radiation to chest. CT surgery following for CABG.  3. COPD- repeat PFTs today  4. H/O lung cancer - non small cell cancer. Had chemo and radiation.  5 H/O melanoma RLE 6. H/O colon cancer  Length of Stay: 7 Amy Clegg, NP  05/07/2017, 10:27 AM  Advanced Heart Failure Team Pager [phone] (M-F; 7a - 4p)  Please contact [facility] for night-coverage after hours (4p -7a ) and weekends on [email or web address]  Patient seen and examined with Darrick Grinder, NP. We discussed all aspects of the encounter. I agree with the assessment and plan as stated above.   Patient's history, echo and cath images and RHC data reviewed personally with Dr. Doylene Canard.   51 y/o woman with h/o lung CA and COPD admitted with CHF. EF found to be about 20-25%. RV only mildly HK. Cath with 60-70% LM lesion. Has been seen by Dr. Servando Snare and pending CABG. We have been asked to consult to assess adequacy for CABG.   From hemodynamic standpoint she seems well optimized. Will await PFTs tomorrow for final clearance.   Interestingly, in looking her angiograms, she  has focal ostial, calcified CAD lesion in the LM with otherwise normal cors. I suspect this may be related to previous XRT.   Continue current therapy. Await PFT results. Suspect she will be CABG candidate.   Glori Bickers, MD  9:35 PM

## 2017-05-07 NOTE — Consult Note (Signed)
Ref: Bonnita Hollow, MD   Subjective:  VS stable. Exertional dyspnea continues. Significant LM disease with dampening of pressures with catheter engagement. Stable right heart pressures. 20-25 % EF by echocardiogram. Mild to moderate AI, mild MR and moderately reduced RV systolic function.  Objective:  Vital Signs in the last 24 hours: Temp:  [97.6 F (36.4 C)-98.2 F (36.8 C)] 98 F (36.7 C) (09/05 0408) Pulse Rate:  [0-100] 93 (09/05 0755) Cardiac Rhythm: Heart block (09/05 0700) Resp:  [0-74] 18 (09/05 0755) BP: (76-116)/(47-76) 101/67 (09/05 0408) SpO2:  [0 %-96 %] 93 % (09/05 0755) Weight:  [68.3 kg (150 lb 9.6 oz)] 68.3 kg (150 lb 9.6 oz) (09/05 0408)  Physical Exam: BP Readings from Last 1 Encounters:  05/07/17 101/67    Wt Readings from Last 1 Encounters:  05/07/17 68.3 kg (150 lb 9.6 oz)    Weight change: -0.272 kg (-9.6 oz) Body mass index is 25.06 kg/m. HEENT: [place]/AT, Eyes-Hazel, PERL, EOMI, Conjunctiva-Pink, Sclera-Non-icteric Neck: No JVD, No bruit, Trachea midline. Lungs:  Clearing, Bilateral. Cardiac:  Regular rhythm, normal S1 and S2, no S3. II/VI systolic murmur. Abdomen:  Soft, non-tender. BS present. Extremities:  No edema present. No cyanosis. No clubbing. No right groin hematoma CNS: AxOx3, Cranial nerves grossly intact, moves all 4 extremities.  Skin: Warm and dry.   Intake/Output from previous day: 09/04 0701 - 09/05 0700 In: 545.2 [P.O.:480; I.V.:65.2] Out: 2100 [Urine:2100]    Lab Results: BMET    Component Value Date/Time   NA 136 05/07/2017 0530   NA 137 05/06/2017 1106   NA 138 05/04/2017 0524   NA 141 12/11/2016 1106   NA 138 03/13/2016 1121   NA 142 11/07/2014 1045   K 4.5 05/07/2017 0530   K 4.4 05/06/2017 1106   K 4.2 05/04/2017 0524   K 3.5 12/11/2016 1106   K 3.9 03/13/2016 1121   K 3.6 11/07/2014 1045   CL 104 05/07/2017 0530   CL 104 05/06/2017 1106   CL 103 05/04/2017 0524   CL 111 (H) 07/09/2012 0958   CL 100  07/09/2011 1413   CO2 26 05/07/2017 0530   CO2 24 05/06/2017 1106   CO2 21 (L) 05/04/2017 0524   CO2 22 12/11/2016 1106   CO2 26 03/13/2016 1121   CO2 24 11/07/2014 1045   GLUCOSE 84 05/07/2017 0530   GLUCOSE 87 05/06/2017 1106   GLUCOSE 89 05/04/2017 0524   GLUCOSE 84 12/11/2016 1106   GLUCOSE 87 03/13/2016 1121   GLUCOSE 75 11/07/2014 1045   GLUCOSE 80 07/09/2012 0958   GLUCOSE 96 07/09/2011 1413   BUN 14 05/07/2017 0530   BUN 15 05/06/2017 1106   BUN 12 05/04/2017 0524   BUN 8.1 12/11/2016 1106   BUN 10.7 03/13/2016 1121   BUN 8.7 11/07/2014 1045   CREATININE 0.64 05/07/2017 0530   CREATININE 0.67 05/06/2017 1106   CREATININE 0.69 05/04/2017 0524   CREATININE 0.8 12/11/2016 1106   CREATININE 0.8 03/13/2016 1121   CREATININE 0.8 11/07/2014 1045   CALCIUM 9.1 05/07/2017 0530   CALCIUM 9.2 05/06/2017 1106   CALCIUM 9.8 05/04/2017 0524   CALCIUM 9.0 12/11/2016 1106   CALCIUM 8.8 03/13/2016 1121   CALCIUM 8.9 11/07/2014 1045   GFRNONAA >60 05/07/2017 0530   GFRNONAA >60 05/06/2017 1106   GFRNONAA >60 05/04/2017 0524   GFRAA >60 05/07/2017 0530   GFRAA >60 05/06/2017 1106   GFRAA >60 05/04/2017 0524   CBC    Component Value Date/Time  WBC 6.4 05/07/2017 0530   RBC 4.87 05/07/2017 0530   HGB 13.9 05/07/2017 0530   HGB 13.6 12/11/2016 1106   HCT 41.5 05/07/2017 0530   HCT 39.1 12/11/2016 1106   PLT 203 05/07/2017 0530   PLT 221 12/11/2016 1106   MCV 85.2 05/07/2017 0530   MCV 84.4 12/11/2016 1106   MCH 28.5 05/07/2017 0530   MCHC 33.5 05/07/2017 0530   RDW 13.2 05/07/2017 0530   RDW 13.5 12/11/2016 1106   LYMPHSABS 1.0 04/30/2017 1627   LYMPHSABS 1.3 12/11/2016 1106   MONOABS 0.5 04/30/2017 1627   MONOABS 0.4 12/11/2016 1106   EOSABS 0.0 04/30/2017 1627   EOSABS 0.1 12/11/2016 1106   BASOSABS 0.0 04/30/2017 1627   BASOSABS 0.1 12/11/2016 1106   HEPATIC Function Panel  Recent Labs  12/11/16 1106  PROT 7.5   HEMOGLOBIN A1C No components found for:  HGA1C,  MPG CARDIAC ENZYMES Lab Results  Component Value Date   TROPONINI <0.03 05/01/2017   TROPONINI 0.03 (HH) 05/01/2017   TROPONINI 0.04 (HH) 04/30/2017   BNP No results for input(s): PROBNP in the last 8760 hours. TSH  Recent Labs  04/30/17 2125  TSH 1.695   CHOLESTEROL  Recent Labs  05/02/17 1108  CHOL 155    Scheduled Meds: . atorvastatin  40 mg Oral q1800  . losartan  25 mg Oral Daily  . metoprolol tartrate  25 mg Oral TID  . senna  1 tablet Oral Daily  . sodium chloride flush  3 mL Intravenous Q12H  . sodium chloride flush  3 mL Intravenous Q12H  . umeclidinium-vilanterol  1 puff Inhalation Daily   Continuous Infusions: . sodium chloride    . sodium chloride    . heparin 1,000 Units/hr (05/07/17 0642)   PRN Meds:.sodium chloride, sodium chloride, acetaminophen **OR** acetaminophen, albuterol, ALPRAZolam, calcium carbonate, ipratropium-albuterol, ondansetron (ZOFRAN) IV, polyethylene glycol, sodium chloride flush, sodium chloride flush, zolpidem  Assessment/Plan: Acute on chronic left heart systolic failure Significant Left Main coronary artery disease Abnormal tropnine I from demand ischemia COPD H/O lung cancer with lobectomy H/O colon cancer S/P skin cancer Mild to moderate RV systolic failure Mild to moderate AI  Mild MR  PFT with DLCO Heart failure team consult Appreciate CVTS consult   LOS: 7 days    [name]  MD  05/07/2017, 10:12 AM

## 2017-05-07 NOTE — Progress Notes (Signed)
Family Medicine Teaching Service Daily Progress Note Intern Pager: [phone]  Patient name: [name] Medical record number: [id number] Date of birth: 1965/12/01 Age: 51 y.o. Gender: female  Primary Care Provider: Bonnita Hollow, MD Consultants: Pulmonology Code Status: Full  Pt Overview and Major Events to Date:  [name] is a 51 y.o. female presenting with shortness of breath in the setting of new onset CHF. PMH is significant for COPD, GAD, migraines, h/o non-small cell lung cancer.  Assessment and Plan: Dyspnea 2/2 new onset CHF: Improved after dieuresis. Denies SOB at rest. BNP 679. HR 93. RR 18. O2 sats 93-95% on room air. Likely due to new onset CHF - echo with EF 20-25% with diffuse hypokinesis. CXR with minimal change from the previous examinations. Pt seems to have a baseline tachycardia around low 100s looking through prior charts. UOP 1.5L last 24 hours. Last lasix 04/30/17. a1c 4.9. LDL 89, triglycerides 154. Trop on 8/29 0.04 > 0.03. Patient received L/RHC yesterday, with significant LM disease 60% stenosed and proximal RCA 15% stenosed. CVTS consulted who recommend updated PFTs and evaluation by HF team, high risk candidate for CABG. Will continue to monitor BP and volume status.  Consider 12.5mg  aldactone per cards recs if BP can tolerate. - cardiac monitoring, continuous pulse ox - Incentive spirometry - Supplemental oxygen via [place] for O2 95-97% - wean as tolerated. - Anoro per pulm - Albuterolq6 prn for breakthrough SOB, wheezing - hold lasix, consider restarting if respiratory status worsens - atorvastatin 40mg  - metoprolol tartrate, should discharge home with succinate - cardiology following: agree with BB, consider aldactone as above, L/RHC 9/4 with CVTS and HF consults; considering entresto in the future  COPD: Stable. SOB worsening on exertion.  - Continue Anoro Ellipta  Constipation: chronic. Had 2 BMs yesterday of normal caliber. - Miralax  prn               H/o Non-small cell cancer of right lung: Thoracotomy 1/3 right lobe, chemo 5 years, radiation for 1 year, dx 2003   - Followed closely by Dr. Baird Lyons, Pulmonology  Migraine: Has chronic headaches - Tylenol 650mg  prn for pain              Insomnia: Uses ambien 2-3 times per week, usually takes xanax twice per day - Continue home ambien as required for sleep          GAD. Chronic. Pt endorses feeling anxious in hospital. Asked for home xanax. Takes 1mg  two times a day prn.  -cont home xanax   -on outpatient follow up, may need additional controlled anxiety medication  FEN/GI: Regular diet Prophylaxis: Lovenox, at goal per pharmacy  Disposition: pending cardiology evaluation (CVTS, HF)  Subjective:  Pt endorses no SOB or chest pain at rest.  Reports sats down into 80s when walking down the hall. Mom had questions about if patient could not undergo bypass surgery what the plan would be, otherwise had no concerns.  Objective: Temp:  [97.6 F (36.4 C)-98.2 F (36.8 C)] 98 F (36.7 C) (09/05 0408) Pulse Rate:  [0-100] 93 (09/05 0755) Resp:  [0-74] 18 (09/05 0755) BP: (76-116)/(47-76) 101/67 (09/05 0408) SpO2:  [0 %-96 %] 93 % (09/05 0755) Weight:  [150 lb 9.6 oz (68.3 kg)] 150 lb 9.6 oz (68.3 kg) (09/05 0408)   Net UOP 1.5L in last 24 hours. Sating mid 90s on room air. BP at 0408 101/67.  Physical Exam: General: thin female resting in bed in NAD  Cardiovascular: RRR, no mrg Respiratory: CTA bilaterally throughout all lung fields, no crackles/wheezes. No increased WOB on room air. Abdomen: soft, nontender, nondistended, +BS Extremities: no lower extremity edema, warm. Dressing in place on R inner thigh.  Laboratory:  Recent Labs Lab 05/04/17 0524 05/06/17 1106 05/07/17 0530  WBC 6.1 6.5 6.4  HGB 13.3 14.0 13.9  HCT 40.0 41.7 41.5  PLT 194 193 203    Recent Labs Lab 05/04/17 0524 05/06/17 1106 05/07/17 0530  NA 138 137 136  K 4.2 4.4 4.5   CL 103 104 104  CO2 21* 24 26  BUN 12 15 14   CREATININE 0.69 0.67 0.64  CALCIUM 9.8 9.2 9.1  GLUCOSE 89 87 84   ABG, CBC, CMP wnl ABG: 7.45, 39.9, 27.9 VBG: 7.39, 49.6, 30.2   Imaging/Diagnostic Tests:  EKG 05/06/17 - ST & T wave abnormalities, consider anterolateral and inferior ischemia, possible 1st degree AV block.  R/LHC 05/06/17   Ost LM to LM lesion, 60% stenosed.  Prox RCA lesion, 15% stenosed.  LV end diastolic pressure is normal.   Left subclavian injection showed patent LIMA. CVTS consult for significant LM disease.  ECHO 05/02/17 Study Conclusions - Left ventricle: LVEF is severely depressed at approximately 20 to   25% with diffuse hypokinesis; inferior, inferoseptal akinesis.   The cavity size was mildly dilated. Wall thickness was normal.   Systolic function was severely reduced. The estimated ejection   fraction was in the range of 20% to 25%. - Aortic valve: There was mild to moderate regurgitation. - Mitral valve: There was mild regurgitation. - Right ventricle: The cavity size was mildly dilated. Systolic   function was moderately reduced. - Right atrium: The atrium was mildly dilated.   Rory Percy, DO 05/07/2017, 9:21 AM PGY-1, [place] Intern pager: [phone], text pages welcome

## 2017-05-08 ENCOUNTER — Other Ambulatory Visit (HOSPITAL_COMMUNITY): Payer: Medicare Other

## 2017-05-08 ENCOUNTER — Inpatient Hospital Stay (HOSPITAL_COMMUNITY): Payer: Medicare Other

## 2017-05-08 LAB — CBC
HCT: 41 % (ref 36.0–46.0)
HEMOGLOBIN: 13.7 g/dL (ref 12.0–15.0)
MCH: 28.8 pg (ref 26.0–34.0)
MCHC: 33.4 g/dL (ref 30.0–36.0)
MCV: 86.3 fL (ref 78.0–100.0)
Platelets: 188 10*3/uL (ref 150–400)
RBC: 4.75 MIL/uL (ref 3.87–5.11)
RDW: 13.4 % (ref 11.5–15.5)
WBC: 5.8 10*3/uL (ref 4.0–10.5)

## 2017-05-08 LAB — SPIROMETRY WITH GRAPH
FEF 25-75 Post: 0.6 L/sec
FEF 25-75 Pre: 0.44 L/sec
FEF2575-%Change-Post: 36 %
FEF2575-%Pred-Post: 22 %
FEF2575-%Pred-Pre: 16 %
FEV1-%Change-Post: 12 %
FEV1-%Pred-Post: 35 %
FEV1-%Pred-Pre: 32 %
FEV1-Post: 1.02 L
FEV1-Pre: 0.91 L
FEV1FVC-%Change-Post: 1 %
FEV1FVC-%Pred-Pre: 75 %
FEV6-%Change-Post: 9 %
FEV6-%Pred-Post: 47 %
FEV6-%Pred-Pre: 43 %
FEV6-Post: 1.65 L
FEV6-Pre: 1.51 L
FEV6FVC-%Change-Post: 0 %
FEV6FVC-%Pred-Post: 102 %
FEV6FVC-%Pred-Pre: 103 %
FVC-%Change-Post: 10 %
FVC-%Pred-Post: 46 %
FVC-%Pred-Pre: 41 %
FVC-Post: 1.67 L
FVC-Pre: 1.51 L
Post FEV1/FVC ratio: 61 %
Post FEV6/FVC ratio: 99 %
Pre FEV1/FVC ratio: 60 %
Pre FEV6/FVC Ratio: 100 %

## 2017-05-08 LAB — BASIC METABOLIC PANEL
Anion gap: 9 (ref 5–15)
BUN: 11 mg/dL (ref 6–20)
CHLORIDE: 103 mmol/L (ref 101–111)
CO2: 25 mmol/L (ref 22–32)
CREATININE: 0.75 mg/dL (ref 0.44–1.00)
Calcium: 9.1 mg/dL (ref 8.9–10.3)
GFR calc Af Amer: >60 mL/min (ref >60)
GLUCOSE: 86 mg/dL (ref 65–99)
POTASSIUM: 4 mmol/L (ref 3.5–5.1)
SODIUM: 137 mmol/L (ref 135–145)

## 2017-05-08 LAB — HEPARIN LEVEL (UNFRACTIONATED): Heparin Unfractionated: 0.45 IU/mL (ref 0.30–0.70)

## 2017-05-08 MED ORDER — MAGNESIUM SULFATE IN D5W 1-5 GM/100ML-% IV SOLN
1.0000 g | Freq: Once | INTRAVENOUS | Status: AC
  Administered 2017-05-09: 1 g via INTRAVENOUS
  Filled 2017-05-08: qty 100

## 2017-05-08 MED ORDER — DIGOXIN 125 MCG PO TABS
0.1250 mg | ORAL_TABLET | Freq: Every day | ORAL | Status: DC
  Administered 2017-05-08 – 2017-05-13 (×6): 0.125 mg via ORAL
  Filled 2017-05-08 (×6): qty 1

## 2017-05-08 NOTE — Progress Notes (Signed)
[place] for heparin Indication: chest pain/ACS  No Known Allergies  Patient Measurements: Height: 5\' 5"  (165.1 cm) Weight: 151 lb 1.6 oz (68.5 kg) IBW/kg (Calculated) : 57 Heparin Dosing Weight: 68 Kg  Assessment: 51 y.o. female with CAD s/p cath 05/07/17, awaiting possible CABG, for heparin.   Heparin level therapeutic: 0.45 CBC stable, no bleeding documented   Goal of Therapy:  Heparin level 0.3-0.7 units/ml Monitor platelets by anticoagulation protocol: Yes   Plan:  Continue heparin gtt at 1250 units/hr Daily heparin level and CBC Monitor for s/s bleeding F/u cards plan   Georga Bora, PharmD Clinical Pharmacist 05/08/2017 8:38 AM

## 2017-05-08 NOTE — Progress Notes (Signed)
Family Medicine Teaching Service Daily Progress Note Intern Pager: [phone]  Patient name: [name] Medical record number: [id number] Date of birth: 08/23/1966 Age: 51 y.o. Gender: female  Primary Care Provider: Bonnita Hollow, MD Consultants: Pulmonology Code Status: Full  Pt Overview and Major Events to Date:  [name] is a 51 y.o. female presenting with shortness of breath in the setting of new onset CHF. PMH is significant for COPD, GAD, migraines, h/o non-small cell lung cancer.  Assessment and Plan: Dyspnea 2/2 new onset CHF: Improved after dieuresis. Denies SOB at rest. BNP 679. HR 100. RR 20. O2 sats 92-100% on room air. Likely due to new onset CHF - echo with EF 20-25% with diffuse hypokinesis. CXR with minimal change from the previous examinations. Pt seems to have a baseline tachycardia around low 100s looking through prior charts. I/O +445.27ml last 24 hours. Last lasix 04/30/17. a1c 4.9. LDL 89, triglycerides 154. Trop on 8/29 0.04 > 0.03 from demand ischemia. Patient received L/RHC yesterday, with significant LM disease 60% stenosed and proximal RCA 15% stenosed. CVTS consulted who recommend updated PFTs and evaluation by HF team, high risk candidate for CABG. Will continue to monitor BP and volume status.  Consider 12.5mg  aldactone per cards recs if BP can tolerate. - cardiac monitoring, continuous pulse ox - Incentive spirometry - Supplemental oxygen via [place] for O2 95-97% - wean as tolerated. - Anoro per pulm - Albuterolq6 prn for breakthrough SOB, wheezing - hold lasix, consider restarting if respiratory status worsens - atorvastatin 40mg  - start coreg 3.125mg , digoxin 0.125mg  daily per cards - cardiology following: agree with BB, consider aldactone as above, L/RHC 9/4 with CVTS and HF consults; considering entresto in the future - PFTs today  COPD: Stable. SOB worsening on exertion.  - Continue Anoro Ellipta  Constipation: chronic. Had 2 BMs  yesterday of normal caliber. - Miralax prn               H/o Non-small cell cancer of right lung: Thoracotomy 1/3 right lobe, chemo 5 years, radiation for 1 year, dx 2003   - Followed closely by Dr. Baird Lyons, Pulmonology  Migraine: Has chronic headaches - Tylenol 650mg  prn for pain              Insomnia: Uses ambien 2-3 times per week, usually takes xanax twice per day - Continue home ambien as required for sleep          GAD. Chronic. Pt endorses feeling anxious in hospital. Asked for home xanax. Takes 1mg  two times a day prn.  -cont home xanax   -on outpatient follow up, may need additional controlled anxiety medication  FEN/GI: Regular diet Prophylaxis: Heparin, at goal per pharmacy  Disposition: pending cardiology evaluation (CVTS, HF)  Subjective:  Pt endorses no SOB or chest pain at rest.  Reports she slept well last night. However did report having to use nebulizer for SOB last night around 1935.  Understands she is getting PFT done today. No questions at this time.  Objective: Temp:  [97.8 F (36.6 C)-98.6 F (37 C)] 98.6 F (37 C) (09/06 3546) Pulse Rate:  [96-110] 100 (09/06 0633) Resp:  [18-20] 20 (09/06 0633) BP: (92-110)/(57-69) 104/59 (09/06 0633) SpO2:  [92 %-100 %] 93 % (09/06 0909) Weight:  [151 lb 1.6 oz (68.5 kg)] 151 lb 1.6 oz (68.5 kg) (09/06 5681)   Net +445.65ml in last 24 hours. Sating mid-upper 90s on room air. BP at 0633 104/59. Tachycardic last night  [id number] around 1943 after neb.  Physical Exam: General: thin female resting in bed in NAD Cardiovascular: RRR, no mrg Respiratory: CTA bilaterally throughout all lung fields, no crackles/wheezes. No increased WOB on room air. Abdomen: soft, nontender, nondistended, +BS Extremities: trace lower extremity edema, warm. Dressing in place on R inner thigh.  Scheduled Meds: . atorvastatin  40 mg Oral q1800  . carvedilol  3.125 mg Oral BID WC  . digoxin  0.125 mg Oral Daily  . losartan  25 mg Oral  Daily  . senna  1 tablet Oral Daily  . sodium chloride flush  3 mL Intravenous Q12H  . sodium chloride flush  3 mL Intravenous Q12H  . umeclidinium-vilanterol  1 puff Inhalation Daily   Continuous Infusions: . sodium chloride    . sodium chloride    . heparin 1,250 Units/hr (05/07/17 2043)   PRN Meds:.sodium chloride, sodium chloride, acetaminophen **OR** acetaminophen, albuterol, ALPRAZolam, calcium carbonate, ipratropium-albuterol, ondansetron (ZOFRAN) IV, polyethylene glycol, sodium chloride flush, sodium chloride flush, zolpidem   Laboratory:  Recent Labs Lab 05/06/17 1106 05/07/17 0530 05/08/17 0443  WBC 6.5 6.4 5.8  HGB 14.0 13.9 13.7  HCT 41.7 41.5 41.0  PLT 193 203 188    Recent Labs Lab 05/06/17 1106 05/07/17 0530 05/08/17 0831  NA 137 136 137  K 4.4 4.5 4.0  CL 104 104 103  CO2 24 26 25   BUN 15 14 11   CREATININE 0.67 0.64 0.75  CALCIUM 9.2 9.1 9.1  GLUCOSE 87 84 86   ABG, CBC, CMP wnl CBC this am wnl  Imaging/Diagnostic Tests:  EKG 05/06/17 - ST & T wave abnormalities, consider anterolateral and inferior ischemia, possible 1st degree AV block.  R/LHC 05/06/17   Ost LM to LM lesion, 60% stenosed.  Prox RCA lesion, 15% stenosed.  LV end diastolic pressure is normal.   Left subclavian injection showed patent LIMA. CVTS consult for significant LM disease.  ECHO 05/02/17 Study Conclusions - Left ventricle: LVEF is severely depressed at approximately 20 to   25% with diffuse hypokinesis; inferior, inferoseptal akinesis.   The cavity size was mildly dilated. Wall thickness was normal.   Systolic function was severely reduced. The estimated ejection   fraction was in the range of 20% to 25%. - Aortic valve: There was mild to moderate regurgitation. - Mitral valve: There was mild regurgitation. - Right ventricle: The cavity size was mildly dilated. Systolic   function was moderately reduced. - Right atrium: The atrium was mildly  dilated.   Rory Percy, DO 05/08/2017, 9:33 AM PGY-1, [place] Intern pager: [phone], text pages welcome

## 2017-05-08 NOTE — Progress Notes (Signed)
05/08/17 @ 14:40:01 - pt had a 9-beat run of V-tach; CV strip may be found in Epic.  Family Medicine attending MD has been notified via [facility] text.  Pt is asymptomatic and stable.

## 2017-05-08 NOTE — Progress Notes (Signed)
Ref: Bonnita Hollow, MD    Subjective:  Event of 9 beat VT noted. Asymptomatic. On diuretic for long time. Hypomagnesemia is suspected. Potassium is normal. PFT showed Severe airway obstructive disease with small improvement with bronchodilator therapy. DLCO pending.  Objective:  Vital Signs in the last 24 hours: Temp:  [97.7 F (36.5 C)-98.6 F (37 C)] 97.7 F (36.5 C) (09/06 1924) Pulse Rate:  [100-103] 103 (09/06 1924) Cardiac Rhythm: Sinus tachycardia (09/06 1900) Resp:  [20] 20 (09/06 1924) BP: (95-104)/(59-60) 95/60 (09/06 1924) SpO2:  [93 %-98 %] 93 % (09/06 1924) Weight:  [68.5 kg (151 lb 1.6 oz)] 68.5 kg (151 lb 1.6 oz) (09/06 4174)  Physical Exam: BP Readings from Last 1 Encounters:  05/08/17 95/60    Wt Readings from Last 1 Encounters:  05/08/17 68.5 kg (151 lb 1.6 oz)    Weight change: 0.227 kg (8 oz) Body mass index is 25.14 kg/m. HEENT: [place]/AT, Eyes-Hazel, PERL, EOMI, Conjunctiva-Pink, Sclera-Non-icteric Neck: No JVD, No bruit, Trachea midline. Lungs:  Clear, Bilateral. Cardiac:  Regular rhythm, normal S1 and S2, no S3. II/VI systolic murmur. Abdomen:  Soft, non-tender. BS present. Extremities:  No edema present. No cyanosis. No clubbing. CNS: AxOx3, Cranial nerves grossly intact, moves all 4 extremities.  Skin: Warm and dry.   Intake/Output from previous day: 09/05 0701 - 09/06 0700 In: 595.7 [P.O.:240; I.V.:355.7] Out: 150 [Urine:150]    Lab Results: BMET    Component Value Date/Time   NA 137 05/08/2017 0831   NA 136 05/07/2017 0530   NA 137 05/06/2017 1106   NA 141 12/11/2016 1106   NA 138 03/13/2016 1121   NA 142 11/07/2014 1045   K 4.0 05/08/2017 0831   K 4.5 05/07/2017 0530   K 4.4 05/06/2017 1106   K 3.5 12/11/2016 1106   K 3.9 03/13/2016 1121   K 3.6 11/07/2014 1045   CL 103 05/08/2017 0831   CL 104 05/07/2017 0530   CL 104 05/06/2017 1106   CL 111 (H) 07/09/2012 0958   CL 100 07/09/2011 1413   CO2 25 05/08/2017 0831   CO2 26  05/07/2017 0530   CO2 24 05/06/2017 1106   CO2 22 12/11/2016 1106   CO2 26 03/13/2016 1121   CO2 24 11/07/2014 1045   GLUCOSE 86 05/08/2017 0831   GLUCOSE 84 05/07/2017 0530   GLUCOSE 87 05/06/2017 1106   GLUCOSE 84 12/11/2016 1106   GLUCOSE 87 03/13/2016 1121   GLUCOSE 75 11/07/2014 1045   GLUCOSE 80 07/09/2012 0958   GLUCOSE 96 07/09/2011 1413   BUN 11 05/08/2017 0831   BUN 14 05/07/2017 0530   BUN 15 05/06/2017 1106   BUN 8.1 12/11/2016 1106   BUN 10.7 03/13/2016 1121   BUN 8.7 11/07/2014 1045   CREATININE 0.75 05/08/2017 0831   CREATININE 0.64 05/07/2017 0530   CREATININE 0.67 05/06/2017 1106   CREATININE 0.8 12/11/2016 1106   CREATININE 0.8 03/13/2016 1121   CREATININE 0.8 11/07/2014 1045   CALCIUM 9.1 05/08/2017 0831   CALCIUM 9.1 05/07/2017 0530   CALCIUM 9.2 05/06/2017 1106   CALCIUM 9.0 12/11/2016 1106   CALCIUM 8.8 03/13/2016 1121   CALCIUM 8.9 11/07/2014 1045   GFRNONAA >60 05/08/2017 0831   GFRNONAA >60 05/07/2017 0530   GFRNONAA >60 05/06/2017 1106   GFRAA >60 05/08/2017 0831   GFRAA >60 05/07/2017 0530   GFRAA >60 05/06/2017 1106   CBC    Component Value Date/Time   WBC 5.8 05/08/2017 0443  RBC 4.75 05/08/2017 0443   HGB 13.7 05/08/2017 0443   HGB 13.6 12/11/2016 1106   HCT 41.0 05/08/2017 0443   HCT 39.1 12/11/2016 1106   PLT 188 05/08/2017 0443   PLT 221 12/11/2016 1106   MCV 86.3 05/08/2017 0443   MCV 84.4 12/11/2016 1106   MCH 28.8 05/08/2017 0443   MCHC 33.4 05/08/2017 0443   RDW 13.4 05/08/2017 0443   RDW 13.5 12/11/2016 1106   LYMPHSABS 1.0 04/30/2017 1627   LYMPHSABS 1.3 12/11/2016 1106   MONOABS 0.5 04/30/2017 1627   MONOABS 0.4 12/11/2016 1106   EOSABS 0.0 04/30/2017 1627   EOSABS 0.1 12/11/2016 1106   BASOSABS 0.0 04/30/2017 1627   BASOSABS 0.1 12/11/2016 1106   HEPATIC Function Panel  Recent Labs  12/11/16 1106  PROT 7.5   HEMOGLOBIN A1C No components found for: HGA1C,  MPG CARDIAC ENZYMES Lab Results  Component  Value Date   TROPONINI <0.03 05/01/2017   TROPONINI 0.03 (HH) 05/01/2017   TROPONINI 0.04 (HH) 04/30/2017   BNP No results for input(s): PROBNP in the last 8760 hours. TSH  Recent Labs  04/30/17 2125  TSH 1.695   CHOLESTEROL  Recent Labs  05/02/17 1108  CHOL 155    Scheduled Meds: . atorvastatin  40 mg Oral q1800  . carvedilol  3.125 mg Oral BID WC  . digoxin  0.125 mg Oral Daily  . losartan  25 mg Oral Daily  . senna  1 tablet Oral Daily  . sodium chloride flush  3 mL Intravenous Q12H  . sodium chloride flush  3 mL Intravenous Q12H  . umeclidinium-vilanterol  1 puff Inhalation Daily   Continuous Infusions: . sodium chloride 250 mL (05/08/17 0938)  . sodium chloride    . heparin 1,250 Units/hr (05/08/17 1917)  . magnesium sulfate 1 - 4 g bolus IVPB     PRN Meds:.sodium chloride, sodium chloride, acetaminophen **OR** acetaminophen, albuterol, ALPRAZolam, calcium carbonate, ipratropium-albuterol, ondansetron (ZOFRAN) IV, polyethylene glycol, sodium chloride flush, sodium chloride flush, zolpidem  Assessment/Plan: Acute on chronic Left heart systolic failure Significant left main coronary artery disease Abnormal troponin I from demand ischemia COPD History of lung cancer with lobectomy History of colon cancer History of skin cancer Mild to moderate RV systolic failure Mild to moderate aortic insufficiency Mild mitral regurgitation Non-sustained ventricular tachycardia  IV 1 g magnesium tonight and check level in a.m.   LOS: 8 days    [name]  MD  05/08/2017, 9:26 PM

## 2017-05-08 NOTE — Progress Notes (Signed)
CARDIAC REHAB PHASE I   PRE:  Rate/Rhythm: 112 ST  BP:  Sitting: 110/67        SaO2: 94 RA  MODE:  Ambulation: 470 ft   POST:  Rate/Rhythm: 124 ST  BP:  Sitting: 111/73         SaO2: 86 RA c/ ambulation, 92 RA at rest  Pt in bed, states she has walked, eager to get out of bed. Pt ambulated 470 ft on RA, IV, assist x1, brisk, steady gait, tolerated fairly well. Pt requested to walk father, however, pt with some DOE, states her legs felt a little tired, sats 86% on RA, HR 124, encouraged pt to return to room. VSS, pt's O2 sats quickly increased to 92% on RA at rest. Pt to bed per pt request after walk, call bell within reach. Will follow.   [place], RN, BSN 05/08/2017 12:23 PM

## 2017-05-08 NOTE — Progress Notes (Signed)
  PFTs reviewed show very severe COPD. No DLCO available.  She would be very high-risk CABG candidate.   Cath films reviewed again. LM lesion favorable for stenting. I think left main stenting (with or without IABP or Impella support) would be best option given anatomy and severe COPD.   Will d/w with Dr. Doylene Canard in am.  Glori Bickers, MD  11:09 PM

## 2017-05-08 NOTE — Progress Notes (Signed)
Advanced Heart Failure Rounding Note  Primary Cardiologist: New to Dr. Doylene Canard Primary HF: New to Dr. Haroldine Laws  Subjective:     Feeling good today. Denies CP, SOB, lightheadedness or dizziness. Anxious for PFTs to be done so she can know "next step".   + 400 cc. Weight up 1 lb. Creatinine stable.   [facility] 05/06/2017 RA 5 PAP 44/22 Mean 30 PCWP 7 Fick CO/CI 2.86/1/62  Ost LM to LM lesion, 60 %stenosed.  Prox RCA lesion, 15 %stenosed.  LV end diastolic pressure is normal.  ECHO 05/02/2017 Left ventricle: LVEF is severely depressed at approximately 20 to 25% with diffuse hypokinesis; inferior, inferoseptal akinesis. The cavity size was mildly dilated. Wall thickness was normal. Systolic function was severely reduced. The estimated ejection fraction was in the range of 20% to 25%. - Aortic valve: There was mild to moderate regurgitation. - Mitral valve: There was mild regurgitation. - Right ventricle: The cavity size was mildly dilated. Systolic function was moderately reduced. - Right atrium: The atrium was mildly dilated.  Objective:   Weight Range: 151 lb 1.6 oz (68.5 kg) Body mass index is 25.14 kg/m.   Vital Signs:   Temp:  [97.8 F (36.6 C)-98.6 F (37 C)] 98.6 F (37 C) (09/06 6962) Pulse Rate:  [96-110] 100 (09/06 0633) Resp:  [18-20] 20 (09/06 9528) BP: (92-110)/(57-69) 104/59 (09/06 0633) SpO2:  [92 %-100 %] 98 % (09/06 4132) Weight:  [151 lb 1.6 oz (68.5 kg)] 151 lb 1.6 oz (68.5 kg) (09/06 0633) Last BM Date: 05/07/17  Weight change: Filed Weights   05/06/17 0525 05/07/17 0408 05/08/17 4401  Weight: 151 lb 3.2 oz (68.6 kg) 150 lb 9.6 oz (68.3 kg) 151 lb 1.6 oz (68.5 kg)    Intake/Output:   Intake/Output Summary (Last 24 hours) at 05/08/17 0819 Last data filed at 05/08/17 0300  Gross per 24 hour  Intake           595.73 ml  Output              150 ml  Net           445.73 ml      Physical Exam    General:  Well appearing.  No resp difficulty HEENT: Normal Neck: Supple. JVP 5-6 cm. Carotids 2+ bilat; no bruits. No lymphadenopathy or thyromegaly appreciated. Cor: PMI nondisplaced. Regular rate & rhythm. No rubs, gallops or murmurs. Lungs: Clear Abdomen: Soft, nontender, nondistended. No hepatosplenomegaly. No bruits or masses. Good bowel sounds. Extremities: No cyanosis, clubbing, rash, edema Neuro: Alert & orientedx3, cranial nerves grossly intact. moves all 4 extremities w/o difficulty. Affect pleasant   Telemetry   NSR 80-90s, Personally reviewed.  EKG    Sinus tach on admit  Labs    CBC  Recent Labs  05/07/17 0530 05/08/17 0443  WBC 6.4 5.8  HGB 13.9 13.7  HCT 41.5 41.0  MCV 85.2 86.3  PLT 203 027   Basic Metabolic Panel  Recent Labs  05/06/17 1106 05/07/17 0530  NA 137 136  K 4.4 4.5  CL 104 104  CO2 24 26  GLUCOSE 87 84  BUN 15 14  CREATININE 0.67 0.64  CALCIUM 9.2 9.1   Liver Function Tests No results for input(s): AST, ALT, ALKPHOS, BILITOT, PROT, ALBUMIN in the last 72 hours. No results for input(s): LIPASE, AMYLASE in the last 72 hours. Cardiac Enzymes No results for input(s): CKTOTAL, CKMB, CKMBINDEX, TROPONINI in the last 72 hours.  BNP: BNP (last  3 results)  Recent Labs  04/30/17 1627 05/01/17 0432  BNP 679.1* 656.4*    ProBNP (last 3 results) No results for input(s): PROBNP in the last 8760 hours.   D-Dimer No results for input(s): DDIMER in the last 72 hours. Hemoglobin A1C No results for input(s): HGBA1C in the last 72 hours. Fasting Lipid Panel No results for input(s): CHOL, HDL, LDLCALC, TRIG, CHOLHDL, LDLDIRECT in the last 72 hours. Thyroid Function Tests No results for input(s): TSH, T4TOTAL, T3FREE, THYROIDAB in the last 72 hours.  Invalid input(s): FREET3  Other results:   Imaging     No results found.   Medications:     Scheduled Medications: . atorvastatin  40 mg Oral q1800  . carvedilol  3.125 mg Oral BID WC  . digoxin   0.125 mg Oral Daily  . losartan  25 mg Oral Daily  . senna  1 tablet Oral Daily  . sodium chloride flush  3 mL Intravenous Q12H  . sodium chloride flush  3 mL Intravenous Q12H  . umeclidinium-vilanterol  1 puff Inhalation Daily     Infusions: . sodium chloride    . sodium chloride    . heparin 1,250 Units/hr (05/07/17 2043)     PRN Medications:  sodium chloride, sodium chloride, acetaminophen **OR** acetaminophen, albuterol, ALPRAZolam, calcium carbonate, ipratropium-albuterol, ondansetron (ZOFRAN) IV, polyethylene glycol, sodium chloride flush, sodium chloride flush, zolpidem    Patient Profile   [name] is a 51 year old with a history of non small cell lung cancer treated with chemo and radiation, emphysema, melanoma RLE ,colon cancer, and  former smoker admitted with increased dyspnea.   LHC with LM disease. Cardiac Surgery requested HF Team consult  Assessment/Plan   1. Acute Systolic Heart Failure ECHO 05/02/2017 EF 20-25%.  [place] 05/06/17 with Fick CO/CI low but appears well compensated. Euvolemic.  - Volume status stable on exam.  - Creatinine and K stable on labs yesterday.  BMET pending today.  Continue carvedilol 3.125 mg BID Continue digoxin 0.125 mg daily.  Continue losartan 25 mg daily 2. CAD- LHC with LM disease 60%. - ? Radiation to chest.  - CT surgery following for CABG.  3. COPD - PFTs pending.  4. H/O lung cancer - non small cell cancer. Had chemo and radiation.  5 H/O melanoma RLE 6. H/O colon cancer  Length of Stay: [address]  [name]  05/08/2017, 8:19 AM  Advanced Heart Failure Team Pager [phone] (M-F; 7a - 4p)  Please contact [facility] for night-coverage after hours (4p -7a ) and weekends on [email or web address]   Patient seen and examined with the above-signed Advanced Practice Provider and/or Housestaff. I personally reviewed laboratory data, imaging studies and relevant notes. I independently examined the patient and formulated  the important aspects of the plan. I have edited the note to reflect any of my changes or salient points. I have personally discussed the plan with the patient and/or family.  Patient seen with Dr. Doylene Canard. HF stable. Cardiomyopathy seems out of proportion to CAD though LM does appear significant. Away PFTs today. Continue current HF regimen.   Glori Bickers, MD  9:39 AM

## 2017-05-09 ENCOUNTER — Encounter (HOSPITAL_COMMUNITY): Payer: Medicare Other

## 2017-05-09 ENCOUNTER — Other Ambulatory Visit (HOSPITAL_COMMUNITY): Payer: Medicare Other

## 2017-05-09 DIAGNOSIS — I2510 Atherosclerotic heart disease of native coronary artery without angina pectoris: Secondary | ICD-10-CM

## 2017-05-09 DIAGNOSIS — I25110 Atherosclerotic heart disease of native coronary artery with unstable angina pectoris: Secondary | ICD-10-CM

## 2017-05-09 DIAGNOSIS — I5021 Acute systolic (congestive) heart failure: Secondary | ICD-10-CM

## 2017-05-09 LAB — BASIC METABOLIC PANEL
Anion gap: 7 (ref 5–15)
BUN: 10 mg/dL (ref 6–20)
CALCIUM: 9.3 mg/dL (ref 8.9–10.3)
CHLORIDE: 105 mmol/L (ref 101–111)
CO2: 25 mmol/L (ref 22–32)
CREATININE: 0.69 mg/dL (ref 0.44–1.00)
Glucose, Bld: 93 mg/dL (ref 65–99)
Potassium: 3.7 mmol/L (ref 3.5–5.1)
SODIUM: 137 mmol/L (ref 135–145)

## 2017-05-09 LAB — MAGNESIUM: MAGNESIUM: 1.8 mg/dL (ref 1.7–2.4)

## 2017-05-09 LAB — CBC
HEMATOCRIT: 39.4 % (ref 36.0–46.0)
Hemoglobin: 13.2 g/dL (ref 12.0–15.0)
MCH: 28.7 pg (ref 26.0–34.0)
MCHC: 33.5 g/dL (ref 30.0–36.0)
MCV: 85.7 fL (ref 78.0–100.0)
Platelets: 179 10*3/uL (ref 150–400)
RBC: 4.6 MIL/uL (ref 3.87–5.11)
RDW: 13.4 % (ref 11.5–15.5)
WBC: 4.9 10*3/uL (ref 4.0–10.5)

## 2017-05-09 LAB — HEPARIN LEVEL (UNFRACTIONATED)
HEPARIN UNFRACTIONATED: 0.81 [IU]/mL — AB (ref 0.30–0.70)
Heparin Unfractionated: 0.48 IU/mL (ref 0.30–0.70)

## 2017-05-09 MED ORDER — CLOPIDOGREL BISULFATE 75 MG PO TABS
75.0000 mg | ORAL_TABLET | Freq: Every day | ORAL | Status: DC
  Administered 2017-05-10 – 2017-05-13 (×4): 75 mg via ORAL
  Filled 2017-05-09 (×5): qty 1

## 2017-05-09 MED ORDER — ASPIRIN EC 81 MG PO TBEC
81.0000 mg | DELAYED_RELEASE_TABLET | Freq: Every day | ORAL | Status: DC
  Administered 2017-05-09 – 2017-05-13 (×4): 81 mg via ORAL
  Filled 2017-05-09 (×5): qty 1

## 2017-05-09 MED ORDER — CLOPIDOGREL BISULFATE 75 MG PO TABS
300.0000 mg | ORAL_TABLET | Freq: Once | ORAL | Status: AC
  Administered 2017-05-09: 300 mg via ORAL
  Filled 2017-05-09: qty 4

## 2017-05-09 MED ORDER — SPIRONOLACTONE 25 MG PO TABS
12.5000 mg | ORAL_TABLET | Freq: Every day | ORAL | Status: DC
  Administered 2017-05-09 – 2017-05-13 (×5): 12.5 mg via ORAL
  Filled 2017-05-09 (×4): qty 1

## 2017-05-09 NOTE — Progress Notes (Signed)
CARDIAC REHAB PHASE I   PRE:  Rate/Rhythm: 130 ST  BP:  Sitting: 107/68        SaO2: 96 RA  MODE:  Ambulation: 940 ft   POST:  Rate/Rhythm: 120 ST  BP:  Sitting: 107/77         SaO2: 93 RA  Pt ambulated 940 ft on RA, IV, assist x1, brisk, steady gait, tolerated well. Pt c/o DOE, improved since yesterday, this is her third walk today. Encouraged ambulation as tolerated. Pt O2 sats 93% on RA during ambulation. Played PCI/stent video for pt, left instructions to view HF video as well. Pt to bed per pt request after walk, call bell within reach. Will follow.   [place], RN, BSN 05/09/2017 2:25 PM

## 2017-05-09 NOTE — Progress Notes (Signed)
[place] for heparin Indication: chest pain/ACS  No Known Allergies  Patient Measurements: Height: 5\' 5"  (165.1 cm) Weight: 152 lb 1.6 oz (69 kg) (scale c) IBW/kg (Calculated) : 57 Heparin Dosing Weight: 68 Kg  Assessment: 51 y.o. female with CAD s/p cath 05/07/17, awaiting possible CABG vs. Left main stent, for heparin. Heparin level above goal this morning and rate reduced after being therapeutic twice. Lab collected this after noon likely an error. Called lab and placed order for STAT repeat heparin level. CBC stable and RN confirms no bleeding.   Goal of Therapy:  Heparin level 0.3-0.7 units/ml Monitor platelets by anticoagulation protocol: Yes   Plan:  Continue heparin gtt at 1100 units/hr STAT heparin level Daily heparin level and CBC Monitor for s/s bleeding   Georga Bora, PharmD Clinical Pharmacist 05/09/2017 3:27 PM

## 2017-05-09 NOTE — Progress Notes (Signed)
Advanced Heart Failure Rounding Note  Primary Cardiologist: New to Dr. Doylene Canard Primary HF: New to Dr. Haroldine Laws  Subjective:    Symptomatically feeling OK this am.  Anxious about the possibility of surgery. Upset that spirometry shows severe COPD limiting her surgical candidacy.   - 200 cc. Weight up 1 lb.  Creatinine stable.    Spirometry 05/08/17 FVC 1.51 (41%) FEV 1 0.91 (32%) No DLCO available  [facility] 05/06/2017 RA 5 PAP 44/22 Mean 30 PCWP 7 Fick CO/CI 2.86/1/62  Ost LM to LM lesion, 60 %stenosed.  Prox RCA lesion, 15 %stenosed.  LV end diastolic pressure is normal.  ECHO 05/02/2017 Left ventricle: LVEF is severely depressed at approximately 20 to 25% with diffuse hypokinesis; inferior, inferoseptal akinesis. The cavity size was mildly dilated. Wall thickness was normal. Systolic function was severely reduced. The estimated ejection fraction was in the range of 20% to 25%. - Aortic valve: There was mild to moderate regurgitation. - Mitral valve: There was mild regurgitation. - Right ventricle: The cavity size was mildly dilated. Systolic function was moderately reduced. - Right atrium: The atrium was mildly dilated.  Objective:   Weight Range: 152 lb 1.6 oz (69 kg) Body mass index is 25.31 kg/m.   Vital Signs:   Temp:  [97.7 F (36.5 C)-98.1 F (36.7 C)] 98.1 F (36.7 C) (09/07 0453) Pulse Rate:  [95-103] 95 (09/07 0453) Resp:  [20] 20 (09/07 0453) BP: (95-100)/(60-64) 100/64 (09/07 0453) SpO2:  [93 %-94 %] 94 % (09/07 0453) Weight:  [152 lb 1.6 oz (69 kg)] 152 lb 1.6 oz (69 kg) (09/07 0453) Last BM Date: 05/08/17  Weight change: Filed Weights   05/07/17 0408 05/08/17 0633 05/09/17 0453  Weight: 150 lb 9.6 oz (68.3 kg) 151 lb 1.6 oz (68.5 kg) 152 lb 1.6 oz (69 kg)    Intake/Output:   Intake/Output Summary (Last 24 hours) at 05/09/17 0827 Last data filed at 05/09/17 0500  Gross per 24 hour  Intake          1728.67 ml  Output              2000 ml  Net          -271.33 ml      Physical Exam    General: Well appearing. No resp difficulty. HEENT: Normal Neck: Supple. JVP 5-6. Carotids 2+ bilat; no bruits. No thyromegaly or nodule noted. Cor: PMI nondisplaced. RRR, No M/G/R noted Lungs: CTAB, normal effort. Abdomen: Soft, non-tender, non-distended, no HSM. No bruits or masses. +BS  Extremities: No cyanosis, clubbing, rash, R and LLE no edema.  Neuro: Alert & orientedx3, cranial nerves grossly intact. moves all 4 extremities w/o difficulty. Affect pleasant   Telemetry   NSR 80-90s, Personally reviewed.    EKG    Sinus tach on admit  Labs    CBC  Recent Labs  05/08/17 0443 05/09/17 0453  WBC 5.8 4.9  HGB 13.7 13.2  HCT 41.0 39.4  MCV 86.3 85.7  PLT 188 545   Basic Metabolic Panel  Recent Labs  05/08/17 0831 05/09/17 0453  NA 137 137  K 4.0 3.7  CL 103 105  CO2 25 25  GLUCOSE 86 93  BUN 11 10  CREATININE 0.75 0.69  CALCIUM 9.1 9.3  MG  --  1.8   Liver Function Tests No results for input(s): AST, ALT, ALKPHOS, BILITOT, PROT, ALBUMIN in the last 72 hours. No results for input(s): LIPASE, AMYLASE in the last 72 hours.  Cardiac Enzymes No results for input(s): CKTOTAL, CKMB, CKMBINDEX, TROPONINI in the last 72 hours.  BNP: BNP (last 3 results)  Recent Labs  04/30/17 1627 05/01/17 0432  BNP 679.1* 656.4*    ProBNP (last 3 results) No results for input(s): PROBNP in the last 8760 hours.   D-Dimer No results for input(s): DDIMER in the last 72 hours. Hemoglobin A1C No results for input(s): HGBA1C in the last 72 hours. Fasting Lipid Panel No results for input(s): CHOL, HDL, LDLCALC, TRIG, CHOLHDL, LDLDIRECT in the last 72 hours. Thyroid Function Tests No results for input(s): TSH, T4TOTAL, T3FREE, THYROIDAB in the last 72 hours.  Invalid input(s): FREET3  Other results:   Imaging    No results found.   Medications:     Scheduled Medications: . atorvastatin  40  mg Oral q1800  . carvedilol  3.125 mg Oral BID WC  . digoxin  0.125 mg Oral Daily  . losartan  25 mg Oral Daily  . senna  1 tablet Oral Daily  . sodium chloride flush  3 mL Intravenous Q12H  . sodium chloride flush  3 mL Intravenous Q12H  . umeclidinium-vilanterol  1 puff Inhalation Daily    Infusions: . sodium chloride 250 mL (05/08/17 0938)  . sodium chloride    . heparin 1,100 Units/hr (05/09/17 0718)    PRN Medications: sodium chloride, sodium chloride, acetaminophen **OR** acetaminophen, albuterol, ALPRAZolam, calcium carbonate, ipratropium-albuterol, ondansetron (ZOFRAN) IV, polyethylene glycol, sodium chloride flush, sodium chloride flush, zolpidem    Patient Profile   [name] is a 51 year old with a history of non small cell lung cancer treated with chemo and radiation, emphysema, melanoma RLE ,colon cancer, and  former smoker admitted with increased dyspnea.   LHC with LM disease. Cardiac Surgery requested HF Team consult  Assessment/Plan   1. Acute Systolic Heart Failure ECHO 05/02/2017 EF 20-25%.  [place] 05/06/17 with Fick CO/CI low but appears well compensated. Euvolemic.  - Volume status stable on exam.  - Creatinine and K stable.  - Continue carvedilol 3.125 mg BID - Continue digoxin 0.125 mg daily.  - Continue losartan 25 mg daily - Will add 12.5 mg spiro qhs.  2. CAD- LHC with LM disease 60%. - ? Radiation to chest.  - CT surgery following for CABG. Discussion of CABG early next week, vs possibly stenting LM based on PFTs. 3. COPD - Spirometry 05/08/17 with severe COPD. No DLCO available (Full PFTs pending).  - Will need to discuss further with TCTS. This would make her very high risk for CABG. Per Dr. Haroldine Laws stenting LM +/- IABP/Impella support may be best option.  4. H/O lung cancer - non small cell cancer. Had chemo and radiation.  - No change.  5 H/O melanoma RLE - No change.  6. H/O colon cancer - No change to current plan.   Length of Stay:  [place], [place]  05/09/2017, 8:27 AM  Advanced Heart Failure Team Pager [phone] (M-F; 7a - 4p)  Please contact [facility] for night-coverage after hours (4p -7a ) and weekends on [email or web address]  Patient seen and examined with the above-signed Advanced Practice Provider and/or Housestaff. I personally reviewed laboratory data, imaging studies and relevant notes. I independently examined the patient and formulated the important aspects of the plan. I have edited the note to reflect any of my changes or salient points. I have personally discussed the plan with the patient and/or family.  PFTs reviewed. Cath films reviewed. She has  60% LM lesion likely related to XRT. However LV dysfunction appears out of proportion to CAD.  LM lesion favorable for stenting.   With severe COPD and LV dysfunction would recommend LM stenting. D/w Dr. Servando Snare and Dr. Doylene Canard. Will plan for LM stent on Monday. Continue current HF meds. BP too low to switch to Entresto at this point. We will follow at a distance.   Lori Bickers, MD  3:21 PM

## 2017-05-09 NOTE — Progress Notes (Signed)
ANTICOAGULATION CONSULT NOTE - Follow Up Consult  Pharmacy Consult for heparin Indication: CAD  Labs:  Recent Labs  05/06/17 1106 05/07/17 0530  05/07/17 2220 05/08/17 0443 05/08/17 0831 05/09/17 0453  HGB 14.0 13.9  --   --  13.7  --  13.2  HCT 41.7 41.5  --   --  41.0  --  39.4  PLT 193 203  --   --  188  --  179  LABPROT 12.9  --   --   --   --   --   --   INR 0.98  --   --   --   --   --   --   HEPARINUNFRC  --  0.17*  < > 0.37 0.45  --  0.81*  CREATININE 0.67 0.64  --   --   --  0.75  --   < > = values in this interval not displayed.   Assessment: 51yo female now above goal on heparin after two levels at goal though had been trending up.  Goal of Therapy:  Heparin level 0.3-0.7 units/ml   Plan:  Will decrease heparin gtt by 2 units/kg/hr to 1100 units/hr and check level in 6hr.  Wynona Neat, PharmD, BCPS  05/09/2017,6:17 AM

## 2017-05-09 NOTE — Consult Note (Signed)
Ref: Bonnita Hollow, MD   Subjective:  PFT shows severe COPD. Patient agrees to stent placement in LM.  Objective:  Vital Signs in the last 24 hours: Temp:  [97.7 F (36.5 C)-98.3 F (36.8 C)] 98.3 F (36.8 C) (09/07 1200) Pulse Rate:  [95-103] 99 (09/07 1200) Cardiac Rhythm: Heart block (09/07 0727) Resp:  [20] 20 (09/07 0453) BP: (95-102)/(60-73) 102/73 (09/07 1200) SpO2:  [93 %-99 %] 99 % (09/07 1200) Weight:  [69 kg (152 lb 1.6 oz)] 69 kg (152 lb 1.6 oz) (09/07 0453)  Physical Exam: BP Readings from Last 1 Encounters:  05/09/17 102/73    Wt Readings from Last 1 Encounters:  05/09/17 69 kg (152 lb 1.6 oz)    Weight change: 0.454 kg (1 lb) Body mass index is 25.31 kg/m. HEENT: [place]/AT, Eyes-Hazel, PERL, EOMI, Conjunctiva-Pink, Sclera-Non-icteric Neck: No JVD, No bruit, Trachea midline. Lungs:  Clear, Bilateral. Cardiac:  Regular rhythm, normal S1 and S2, no S3. II/VI systolic murmur. Abdomen:  Soft, non-tender. BS present. Extremities:  No edema present. No cyanosis. No clubbing. CNS: AxOx3, Cranial nerves grossly intact, moves all 4 extremities.  Skin: Warm and dry.   Intake/Output from previous day: 09/06 0701 - 09/07 0700 In: 1728.7 [P.O.:1135; I.V.:593.7] Out: 2000 [Urine:2000]    Lab Results: BMET    Component Value Date/Time   NA 137 05/09/2017 0453   NA 137 05/08/2017 0831   NA 136 05/07/2017 0530   NA 141 12/11/2016 1106   NA 138 03/13/2016 1121   NA 142 11/07/2014 1045   K 3.7 05/09/2017 0453   K 4.0 05/08/2017 0831   K 4.5 05/07/2017 0530   K 3.5 12/11/2016 1106   K 3.9 03/13/2016 1121   K 3.6 11/07/2014 1045   CL 105 05/09/2017 0453   CL 103 05/08/2017 0831   CL 104 05/07/2017 0530   CL 111 (H) 07/09/2012 0958   CL 100 07/09/2011 1413   CO2 25 05/09/2017 0453   CO2 25 05/08/2017 0831   CO2 26 05/07/2017 0530   CO2 22 12/11/2016 1106   CO2 26 03/13/2016 1121   CO2 24 11/07/2014 1045   GLUCOSE 93 05/09/2017 0453   GLUCOSE 86  05/08/2017 0831   GLUCOSE 84 05/07/2017 0530   GLUCOSE 84 12/11/2016 1106   GLUCOSE 87 03/13/2016 1121   GLUCOSE 75 11/07/2014 1045   GLUCOSE 80 07/09/2012 0958   GLUCOSE 96 07/09/2011 1413   BUN 10 05/09/2017 0453   BUN 11 05/08/2017 0831   BUN 14 05/07/2017 0530   BUN 8.1 12/11/2016 1106   BUN 10.7 03/13/2016 1121   BUN 8.7 11/07/2014 1045   CREATININE 0.69 05/09/2017 0453   CREATININE 0.75 05/08/2017 0831   CREATININE 0.64 05/07/2017 0530   CREATININE 0.8 12/11/2016 1106   CREATININE 0.8 03/13/2016 1121   CREATININE 0.8 11/07/2014 1045   CALCIUM 9.3 05/09/2017 0453   CALCIUM 9.1 05/08/2017 0831   CALCIUM 9.1 05/07/2017 0530   CALCIUM 9.0 12/11/2016 1106   CALCIUM 8.8 03/13/2016 1121   CALCIUM 8.9 11/07/2014 1045   GFRNONAA >60 05/09/2017 0453   GFRNONAA >60 05/08/2017 0831   GFRNONAA >60 05/07/2017 0530   GFRAA >60 05/09/2017 0453   GFRAA >60 05/08/2017 0831   GFRAA >60 05/07/2017 0530   CBC    Component Value Date/Time   WBC 4.9 05/09/2017 0453   RBC 4.60 05/09/2017 0453   HGB 13.2 05/09/2017 0453   HGB 13.6 12/11/2016 1106   HCT 39.4 05/09/2017 0453  HCT 39.1 12/11/2016 1106   PLT 179 05/09/2017 0453   PLT 221 12/11/2016 1106   MCV 85.7 05/09/2017 0453   MCV 84.4 12/11/2016 1106   MCH 28.7 05/09/2017 0453   MCHC 33.5 05/09/2017 0453   RDW 13.4 05/09/2017 0453   RDW 13.5 12/11/2016 1106   LYMPHSABS 1.0 04/30/2017 1627   LYMPHSABS 1.3 12/11/2016 1106   MONOABS 0.5 04/30/2017 1627   MONOABS 0.4 12/11/2016 1106   EOSABS 0.0 04/30/2017 1627   EOSABS 0.1 12/11/2016 1106   BASOSABS 0.0 04/30/2017 1627   BASOSABS 0.1 12/11/2016 1106   HEPATIC Function Panel  Recent Labs  12/11/16 1106  PROT 7.5   HEMOGLOBIN A1C No components found for: HGA1C,  MPG CARDIAC ENZYMES Lab Results  Component Value Date   TROPONINI <0.03 05/01/2017   TROPONINI 0.03 (HH) 05/01/2017   TROPONINI 0.04 (HH) 04/30/2017   BNP No results for input(s): PROBNP in the last 8760  hours. TSH  Recent Labs  04/30/17 2125  TSH 1.695   CHOLESTEROL  Recent Labs  05/02/17 1108  CHOL 155    Scheduled Meds: . aspirin EC  81 mg Oral Daily  . atorvastatin  40 mg Oral q1800  . carvedilol  3.125 mg Oral BID WC  . [START ON 05/10/2017] clopidogrel  75 mg Oral Daily  . digoxin  0.125 mg Oral Daily  . losartan  25 mg Oral Daily  . senna  1 tablet Oral Daily  . sodium chloride flush  3 mL Intravenous Q12H  . sodium chloride flush  3 mL Intravenous Q12H  . spironolactone  12.5 mg Oral Daily  . umeclidinium-vilanterol  1 puff Inhalation Daily   Continuous Infusions: . sodium chloride 250 mL (05/08/17 0938)  . sodium chloride    . heparin 1,100 Units/hr (05/09/17 0718)   PRN Meds:.sodium chloride, sodium chloride, acetaminophen **OR** acetaminophen, albuterol, ALPRAZolam, calcium carbonate, ipratropium-albuterol, ondansetron (ZOFRAN) IV, polyethylene glycol, sodium chloride flush, sodium chloride flush, zolpidem  Assessment/Plan: Acute on chronic left heart systolic failure Significant LM CAD Abnormal troponin-I from demand ischemia Severe COPD H/O lung cancer H/O colon cancer H/O skin cancer Mild to moderate RV systolic failure Mild to moderate aortic insufficiency Mild mitral regurgitation Non-sustained VT  Start Plavix 300 mg. Loading today and 75 mg. daily from tomorrow. Patient understood procedure, risks and alternatives. LM angioplasty by Dr. Terrence Dupont on Monday.   LOS: 9 days    [name]  MD  05/09/2017, 1:13 PM

## 2017-05-09 NOTE — Progress Notes (Signed)
Family Medicine Teaching Service Daily Progress Note Intern Pager: [phone]  Patient name: [name] Medical record number: [id number] Date of birth: [birth year] Age: 51 y.o. Gender: female  Primary Care Provider: Bonnita Hollow, MD Consultants: Pulmonology Code Status: Full  Pt Overview and Major Events to Date:  [name] is a 51 y.o. female presenting with shortness of breath in the setting of new onset CHF. PMH is significant for COPD, GAD, migraines, h/o non-small cell lung cancer.  Assessment and Plan: Dyspnea 2/2 new onset CHF: Improved after dieuresis. Denies SOB at rest but remains to have DOE. BNP 679. Likely due to new onset CHF - echo with EF 20-25% with diffuse hypokinesis. Last lasix 04/30/17. a1c 4.9. LDL 89, triglycerides 154. Trop on 8/29 0.04 > 0.03 from demand ischemia. Patient received [facility] 9/4, with significant LM disease 60% stenosed and proximal RCA 15% stenosed. CVTS consulted who recommend updated PFTs and evaluation by HF team, high risk candidate for CABG. PFTs show severe COPD, no DLCO available. L main stenting vs. CABG - HF team and CVTS to discuss based on severe COPD compromise on spirometry. Will continue to monitor BP and volume status.  Consider 12.5mg  aldactone per cards recs if BP can tolerate. - cardiac monitoring, continuous pulse ox - Incentive spirometry - Supplemental oxygen via [place] for O2 95-97% - wean as tolerated. - Anoro per pulm - Albuterolq6 prn for breakthrough SOB, wheezing - hold lasix, consider restarting if respiratory status worsens - atorvastatin 40mg  - start coreg 3.125mg , digoxin 0.125mg  daily per cards - cardiology following: agree with BB, consider aldactone as above, L/RHC 9/4 with CVTS and HF consults; considering entresto in the future  COPD: Stable. SOB worsening on exertion. PFTs show severe airway obstruction. - Continue Anoro Ellipta  Constipation: chronic. Had 1 BM yesterday of normal caliber. - Senna  daily - Miralax prn               H/o Non-small cell cancer of right lung: Thoracotomy 1/3 right lobe, chemo 5 years, radiation for 1 year, dx 2003   - Followed closely by Dr. Baird Lyons, Pulmonology  Migraine: Has chronic headaches - Tylenol 650mg  prn for pain              Insomnia: Uses ambien 2-3 times per week, usually takes xanax twice per day - Continue home ambien as required for sleep          GAD. Chronic. Pt endorses feeling anxious in hospital. Asked for home xanax. Takes 1mg  two times a day prn.  -cont home xanax   -on outpatient follow up, may need additional controlled anxiety medication  FEN/GI: Regular diet Prophylaxis: Plavix, ASA, Heparin, dose per pharmacy  Disposition: pending cardiology evaluation (CVTS, HF)  Subjective:  Pt seen by cardiac rehab yesterday with some DOE on exertion and sats to 86%. Sats improved upon rest. 9 beat VT noted yesterday afternoon.  Pt denies any SOB, chest pain today. Had many questions about lung function, surgery candidacy, and plan going forward.  Objective: Temp:  [97.7 F (36.5 C)-98.1 F (36.7 C)] 98.1 F (36.7 C) (09/07 0453) Pulse Rate:  [95-103] 95 (09/07 0453) Resp:  [20] 20 (09/07 0453) BP: (95-100)/(60-64) 100/64 (09/07 0453) SpO2:  [93 %-94 %] 94 % (09/07 0453) Weight:  [152 lb 1.6 oz (69 kg)] 152 lb 1.6 oz (69 kg) (09/07 0453)   Net -271.15ml in last 24 hours. Sating mid-upper 90s on room air. BP at 0453 100/64.  Physical Exam: General: thin female resting in bed in NAD Cardiovascular: RRR, no mrg Respiratory: CTA bilaterally throughout all lung fields, no crackles/wheezes. No increased WOB on room air. Abdomen: soft, nontender, nondistended, +BS Extremities: no lower extremity edema, warm. Dressing in place on R inner thigh.  Scheduled Meds: . atorvastatin  40 mg Oral q1800  . carvedilol  3.125 mg Oral BID WC  . digoxin  0.125 mg Oral Daily  . losartan  25 mg Oral Daily  . senna  1 tablet Oral Daily   . sodium chloride flush  3 mL Intravenous Q12H  . sodium chloride flush  3 mL Intravenous Q12H  . umeclidinium-vilanterol  1 puff Inhalation Daily   Continuous Infusions: . sodium chloride 250 mL (05/08/17 0938)  . sodium chloride    . heparin 1,250 Units/hr (05/08/17 1917)   PRN Meds:.sodium chloride, sodium chloride, acetaminophen **OR** acetaminophen, albuterol, ALPRAZolam, calcium carbonate, ipratropium-albuterol, ondansetron (ZOFRAN) IV, polyethylene glycol, sodium chloride flush, sodium chloride flush, zolpidem   Laboratory:  Recent Labs Lab 05/07/17 0530 05/08/17 0443 05/09/17 0453  WBC 6.4 5.8 4.9  HGB 13.9 13.7 13.2  HCT 41.5 41.0 39.4  PLT 203 188 179    Recent Labs Lab 05/07/17 0530 05/08/17 0831 05/09/17 0453  NA 136 137 137  K 4.5 4.0 3.7  CL 104 103 105  CO2 26 25 25   BUN 14 11 10   CREATININE 0.64 0.75 0.69  CALCIUM 9.1 9.1 9.3  GLUCOSE 84 86 93   ABG, CBC, CMP wnl Mg 1.8  Imaging/Diagnostic Tests:  PFT 05/08/17 Interpretation: The FVC, FEV1, FEV1/FVC ratio and FEF25-75% are reduced indicating airway obstruction. The slow vital capacity is reduced. Following administration of bronchodilators, there is slight response. Conclusions: Pulmonary Function Diagnosis: Severe Obstructive Airways Disease Slight response to bronchodilator No DLCO available.  EKG 05/06/17 - ST & T wave abnormalities, consider anterolateral and inferior ischemia, possible 1st degree AV block.  R/LHC 05/06/17   Ost LM to LM lesion, 60% stenosed.  Prox RCA lesion, 15% stenosed.  LV end diastolic pressure is normal.   Left subclavian injection showed patent LIMA. CVTS consult for significant LM disease.  ECHO 05/02/17 Study Conclusions - Left ventricle: LVEF is severely depressed at approximately 20 to   25% with diffuse hypokinesis; inferior, inferoseptal akinesis.   The cavity size was mildly dilated. Wall thickness was normal.   Systolic function was  severely reduced. The estimated ejection   fraction was in the range of 20% to 25%. - Aortic valve: There was mild to moderate regurgitation. - Mitral valve: There was mild regurgitation. - Right ventricle: The cavity size was mildly dilated. Systolic   function was moderately reduced. - Right atrium: The atrium was mildly dilated.   Rory Percy, DO 05/09/2017, 7:14 AM PGY-1, [place] Intern pager: [phone], text pages welcome

## 2017-05-09 NOTE — Progress Notes (Signed)
[name] for heparin Indication: chest pain/ACS  No Known Allergies  Patient Measurements: Height: 5\' 5"  (165.1 cm) Weight: 152 lb 1.6 oz (69 kg) (scale c) IBW/kg (Calculated) : 57 Heparin Dosing Weight: 68 Kg  Assessment: 51 y.o. female with CAD s/p cath 05/07/17, awaiting possible CABG vs. Left main stent, for heparin. Heparin level above goal this morning and rate reduced after being therapeutic twice. Lab collected this after noon likely an error. Repeat heparin level this afternoon was therapeutic at 0.48. CBC stable and no bleeding noted.   Goal of Therapy:  Heparin level 0.3-0.7 units/ml Monitor platelets by anticoagulation protocol: Yes   Plan:  Continue heparin gtt at 1100 units/hr Daily heparin level and CBC Monitor for s/s bleeding   Thank you for allowing [place] to participate in this patients care. Jens Som, PharmD 05/09/2017 4:11 PM

## 2017-05-09 NOTE — Progress Notes (Addendum)
      [address]       [place] [address]             [phone]                 3 Days Post-Op Procedure(s) (LRB): RIGHT/LEFT HEART CATH AND CORONARY ANGIOGRAPHY (N/A)  LOS: 9 days   Subjective: Breathing better   Objective: Vital signs in last 24 hours: Patient Vitals for the past 24 hrs:  BP Temp Temp src Pulse Resp SpO2 Weight  05/09/17 0453 100/64 98.1 F (36.7 C) Oral 95 20 94 % 152 lb 1.6 oz (69 kg)  05/08/17 1924 95/60 97.7 F (36.5 C) Oral (!) 103 20 93 % -  05/08/17 0909 - - - - - 93 % -    Filed Weights   05/07/17 0408 05/08/17 0633 05/09/17 0453  Weight: 150 lb 9.6 oz (68.3 kg) 151 lb 1.6 oz (68.5 kg) 152 lb 1.6 oz (69 kg)    Hemodynamic parameters for last 24 hours:    Intake/Output from previous day: 09/06 0701 - 09/07 0700 In: 1728.7 [P.O.:1135; I.V.:593.7] Out: 2000 [Urine:2000] Intake/Output this shift: No intake/output data recorded.  Scheduled Meds: . atorvastatin  40 mg Oral q1800  . carvedilol  3.125 mg Oral BID WC  . digoxin  0.125 mg Oral Daily  . losartan  25 mg Oral Daily  . senna  1 tablet Oral Daily  . sodium chloride flush  3 mL Intravenous Q12H  . sodium chloride flush  3 mL Intravenous Q12H  . umeclidinium-vilanterol  1 puff Inhalation Daily   Continuous Infusions: . sodium chloride 250 mL (05/08/17 0938)  . sodium chloride    . heparin 1,250 Units/hr (05/08/17 1917)   PRN Meds:.sodium chloride, sodium chloride, acetaminophen **OR** acetaminophen, albuterol, ALPRAZolam, calcium carbonate, ipratropium-albuterol, ondansetron (ZOFRAN) IV, polyethylene glycol, sodium chloride flush, sodium chloride flush, zolpidem    Lab Results: CBC: Recent Labs  05/08/17 0443 05/09/17 0453  WBC 5.8 4.9  HGB 13.7 13.2  HCT 41.0 39.4  PLT 188 179   BMET:  Recent Labs  05/08/17 0831 05/09/17 0453  NA 137 137  K 4.0 3.7  CL 103 105  CO2 25 25  GLUCOSE 86 93  BUN 11 10  CREATININE 0.75 0.69  CALCIUM 9.1 9.3    PT/INR:  Recent Labs  05/06/17 1106  LABPROT 12.9  INR 0.98     Radiology No results found.   Assessment/Plan: S/P Procedure(s) (LRB): RIGHT/LEFT HEART CATH AND CORONARY ANGIOGRAPHY (N/A)  PFT's reviewed I have reviewed cas with cardiology, Bensimhon and Cooper feel that left main stenting can be done with relative low risk comparing to CABG with significant  LV dysfunction and limited pulmonary reserve . This seems considering all factors reasonable treatment option. Patient denies any history that would preclude use of antiplatelet meds . Reviewed with patient .  Grace Isaac MD 05/09/2017 7:16 AM     Patient ID: [name], female   DOB: [birth year], 51 y.o.   MRN: [id number]

## 2017-05-10 LAB — CBC
HEMATOCRIT: 37 % (ref 36.0–46.0)
HEMOGLOBIN: 12.4 g/dL (ref 12.0–15.0)
MCH: 28.6 pg (ref 26.0–34.0)
MCHC: 33.5 g/dL (ref 30.0–36.0)
MCV: 85.5 fL (ref 78.0–100.0)
Platelets: 169 10*3/uL (ref 150–400)
RBC: 4.33 MIL/uL (ref 3.87–5.11)
RDW: 13.2 % (ref 11.5–15.5)
WBC: 5.8 10*3/uL (ref 4.0–10.5)

## 2017-05-10 LAB — BASIC METABOLIC PANEL
ANION GAP: 7 (ref 5–15)
BUN: 8 mg/dL (ref 6–20)
CHLORIDE: 105 mmol/L (ref 101–111)
CO2: 25 mmol/L (ref 22–32)
Calcium: 9.1 mg/dL (ref 8.9–10.3)
Creatinine, Ser: 0.79 mg/dL (ref 0.44–1.00)
GFR calc non Af Amer: >60 mL/min (ref >60)
Glucose, Bld: 92 mg/dL (ref 65–99)
Potassium: 4 mmol/L (ref 3.5–5.1)
SODIUM: 137 mmol/L (ref 135–145)

## 2017-05-10 LAB — HEPARIN LEVEL (UNFRACTIONATED): Heparin Unfractionated: 0.42 IU/mL (ref 0.30–0.70)

## 2017-05-10 NOTE — Progress Notes (Signed)
CARDIAC REHAB PHASE I   PRE:  Rate/Rhythm: 99  BP:  Sitting: 100/63      SaO2: 95 RA  MODE:  Ambulation: 940 ft   POST:  Rate/Rhythm: 108 st  BP:  Sitting: 100/66     SaO2: 97 ra  1130-1206 Patient ambulated in hallway independently pushing IV pole. Steady gait noted. Denied complaints. Unable to obtain ambulatory saturations r/t cold pale fingertips. With standing rest breaks saturations 93% on RA. Post ambulation patient c/o HA and primary RN notified. Patient encouraged to ambulate again today but to notify floor RN if developed symptoms of CP or pressure. Will follow up with patient on Monday post procedure.  Duanna Runk English PayneRN, BSN 05/10/2017 12:07 PM

## 2017-05-10 NOTE — Progress Notes (Signed)
Family Medicine Teaching Service Daily Progress Note Intern Pager: [phone]  Patient name: [name] Medical record number: [id number] Date of birth: [birth year] Age: 51 y.o. Gender: female  Primary Care Provider: Bonnita Hollow, MD Consultants: Cardiology Code Status: Full  Pt Overview and Major Events to Date:  [name] a 51 y.o.femalepresenting with shortness of breath in the setting of new onset CHF. PMH is significant for COPD, GAD, migraines, h/o non-small cell lung cancer.  Assessment and Plan:  Dyspnea 2/2 new onset CHF found to have significant LM CAD on R/LHC:  Shortness of breath has resolved. Weight stable ~151 lbs. Net negative 12 L since admission; off lasix since 8/29. New onset CHF with EF 20-25% and diffuse hypokinesis. Patient [facility] 9/4, with significant LM disease 60% stenosed and proximal RCA 15% stenosed. Not felt to be a candidate for CABG given her severe COPD. Plan for LM angioplasty by Dr. Terrence Dupont 9/11. Currently on heparin gtt pending stenting. Loaded with Plavix yesterday, to continue 75 mg daily thereafter.  - cardiac monitoring, d/c continuous pulse ox - Incentive spirometry - Anoro per pulm - Albuterolq6 prn for breakthrough SOB, wheezing - hold lasix, consider restarting if respiratory status worsens - atorvastatin 40mg  - continue coreg 3.125mg , digoxin 0.125mg  daily per cards, BP soft and cannot tolerate additional spironolacte  COPD:  Stable. Denies any shortness of breath or complaints today. - Continue Anoro Ellipta  H/o Non-small cell cancer of right lung: Thoracotomy 1/3 right lobe, chemo 5 years, radiation for 1 year, dx 2003 - Followed closely by Dr. Baird Lyons, Pulmonology  Migraine: Has chronic headaches - Tylenol 650mg  prn for pain  Insomnia: Uses ambien 2-3 times per week, usually takes xanax twice per day - Continue home ambien as required for sleep  GAD. Chronic. Pt endorses feeling  anxious in hospital. Takes 1mg  two times a day prn.  -cont home xanax -on outpatient follow up, may need additional controlled anxiety medication  FEN/GI: Regular diet Prophylaxis: Plavix, ASA, Heparin, dose per pharmacy  Subjective:  Doing well this morning without complaints. Denies any chest pain, palpitations, shortness of breath.   Objective: Temp:  [97.8 F (36.6 C)-98.3 F (36.8 C)] 98 F (36.7 C) (09/08 0615) Pulse Rate:  [99-103] 100 (09/08 0615) Resp:  [18] 18 (09/08 0615) BP: (100-107)/(58-80) 102/71 (09/08 0615) SpO2:  [90 %-99 %] 90 % (09/08 0615) Weight:  [151 lb 6.4 oz (68.7 kg)] 151 lb 6.4 oz (68.7 kg) (09/08 0615) Physical Exam: General: thin female sitting up in bed in no acute distress Cardiovascular: RRR, no m/r/g Respiratory: CTAB, no increased work of breathing Abdomen: soft, non-tender, non-distended, BS+  Laboratory:  Recent Labs Lab 05/08/17 0443 05/09/17 0453 05/10/17 0308  WBC 5.8 4.9 5.8  HGB 13.7 13.2 12.4  HCT 41.0 39.4 37.0  PLT 188 179 169    Recent Labs Lab 05/08/17 0831 05/09/17 0453 05/10/17 0308  NA 137 137 137  K 4.0 3.7 4.0  CL 103 105 105  CO2 25 25 25   BUN 11 10 8   CREATININE 0.75 0.69 0.79  CALCIUM 9.1 9.3 9.1  GLUCOSE 86 93 92    Maryellen Pile, MD PGY3 05/10/2017, 8:49 AM FPTS Intern pager: [phone], text pages welcome

## 2017-05-10 NOTE — Progress Notes (Signed)
[place] for heparin Indication: chest pain/ACS  No Known Allergies  Patient Measurements: Height: 5\' 5"  (165.1 cm) Weight: 151 lb 6.4 oz (68.7 kg) IBW/kg (Calculated) : 57 Heparin Dosing Weight: 68 Kg  Assessment: 51 y.o. female with CAD s/p cath 05/07/17, continuing on heparin while awaiting possible CABG vs. Left main stent, for heparin. Not on anticoagulation PTA. Heparin level remains therapeutic. CBC stable and no bleeding documented.   Goal of Therapy:  Heparin level 0.3-0.7 units/ml Monitor platelets by anticoagulation protocol: Yes   Plan:  Continue heparin gtt at 1100 units/hr Daily heparin level and CBC Monitor for s/s bleeding Plans for left main stent on 9/10   Elicia Lamp, PharmD, BCPS Clinical Pharmacist Rx Phone # for today: [phone] After 3:30PM, please call Main Rx: [phone] 05/10/2017 10:33 AM

## 2017-05-10 NOTE — Progress Notes (Signed)
0700-1800.Marland Kitchen Pt had uneventful day. VSS. No anxiety during my shift. No cardiac compromise noted. Cont with plan of care

## 2017-05-10 NOTE — Consult Note (Signed)
Ref: Bonnita Hollow, MD   Subjective:  Feeling better and looking forward to LM stenting.  Objective:  Vital Signs in the last 24 hours: Temp:  [97.8 F (36.6 C)-98.3 F (36.8 C)] 98 F (36.7 C) (09/08 0615) Pulse Rate:  [99-103] 100 (09/08 0615) Cardiac Rhythm: Sinus tachycardia (09/07 2029) Resp:  [18] 18 (09/08 0615) BP: (100-107)/(58-80) 102/71 (09/08 0615) SpO2:  [90 %-99 %] 90 % (09/08 0615) Weight:  [68.7 kg (151 lb 6.4 oz)] 68.7 kg (151 lb 6.4 oz) (09/08 0615)  Physical Exam: BP Readings from Last 1 Encounters:  05/10/17 102/71    Wt Readings from Last 1 Encounters:  05/10/17 68.7 kg (151 lb 6.4 oz)    Weight change: -0.318 kg (-11.2 oz) Body mass index is 25.19 kg/m. HEENT: [place]/AT, Eyes-Hazel, PERL, EOMI, Conjunctiva-Pink, Sclera-Non-icteric Neck: No JVD, No bruit, Trachea midline. Lungs:  Clear, Bilateral. Cardiac:  Regular rhythm, normal S1 and S2, no S3. II/VI systolic murmur. Abdomen:  Soft, non-tender. BS present. Extremities:  No edema present. No cyanosis. No clubbing. CNS: AxOx3, Cranial nerves grossly intact, moves all 4 extremities.  Skin: Warm and dry.   Intake/Output from previous day: 09/07 0701 - 09/08 0700 In: 1178.5 [P.O.:775; I.V.:303.5; IV Piggyback:100] Out: 3550 [Urine:3550]    Lab Results: BMET    Component Value Date/Time   NA 137 05/10/2017 0308   NA 137 05/09/2017 0453   NA 137 05/08/2017 0831   NA 141 12/11/2016 1106   NA 138 03/13/2016 1121   NA 142 11/07/2014 1045   K 4.0 05/10/2017 0308   K 3.7 05/09/2017 0453   K 4.0 05/08/2017 0831   K 3.5 12/11/2016 1106   K 3.9 03/13/2016 1121   K 3.6 11/07/2014 1045   CL 105 05/10/2017 0308   CL 105 05/09/2017 0453   CL 103 05/08/2017 0831   CL 111 (H) 07/09/2012 0958   CL 100 07/09/2011 1413   CO2 25 05/10/2017 0308   CO2 25 05/09/2017 0453   CO2 25 05/08/2017 0831   CO2 22 12/11/2016 1106   CO2 26 03/13/2016 1121   CO2 24 11/07/2014 1045   GLUCOSE 92 05/10/2017 0308   GLUCOSE 93 05/09/2017 0453   GLUCOSE 86 05/08/2017 0831   GLUCOSE 84 12/11/2016 1106   GLUCOSE 87 03/13/2016 1121   GLUCOSE 75 11/07/2014 1045   GLUCOSE 80 07/09/2012 0958   GLUCOSE 96 07/09/2011 1413   BUN 8 05/10/2017 0308   BUN 10 05/09/2017 0453   BUN 11 05/08/2017 0831   BUN 8.1 12/11/2016 1106   BUN 10.7 03/13/2016 1121   BUN 8.7 11/07/2014 1045   CREATININE 0.79 05/10/2017 0308   CREATININE 0.69 05/09/2017 0453   CREATININE 0.75 05/08/2017 0831   CREATININE 0.8 12/11/2016 1106   CREATININE 0.8 03/13/2016 1121   CREATININE 0.8 11/07/2014 1045   CALCIUM 9.1 05/10/2017 0308   CALCIUM 9.3 05/09/2017 0453   CALCIUM 9.1 05/08/2017 0831   CALCIUM 9.0 12/11/2016 1106   CALCIUM 8.8 03/13/2016 1121   CALCIUM 8.9 11/07/2014 1045   GFRNONAA >60 05/10/2017 0308   GFRNONAA >60 05/09/2017 0453   GFRNONAA >60 05/08/2017 0831   GFRAA >60 05/10/2017 0308   GFRAA >60 05/09/2017 0453   GFRAA >60 05/08/2017 0831   CBC    Component Value Date/Time   WBC 5.8 05/10/2017 0308   RBC 4.33 05/10/2017 0308   HGB 12.4 05/10/2017 0308   HGB 13.6 12/11/2016 1106   HCT 37.0 05/10/2017 0308  HCT 39.1 12/11/2016 1106   PLT 169 05/10/2017 0308   PLT 221 12/11/2016 1106   MCV 85.5 05/10/2017 0308   MCV 84.4 12/11/2016 1106   MCH 28.6 05/10/2017 0308   MCHC 33.5 05/10/2017 0308   RDW 13.2 05/10/2017 0308   RDW 13.5 12/11/2016 1106   LYMPHSABS 1.0 04/30/2017 1627   LYMPHSABS 1.3 12/11/2016 1106   MONOABS 0.5 04/30/2017 1627   MONOABS 0.4 12/11/2016 1106   EOSABS 0.0 04/30/2017 1627   EOSABS 0.1 12/11/2016 1106   BASOSABS 0.0 04/30/2017 1627   BASOSABS 0.1 12/11/2016 1106   HEPATIC Function Panel  Recent Labs  12/11/16 1106  PROT 7.5   HEMOGLOBIN A1C No components found for: HGA1C,  MPG CARDIAC ENZYMES Lab Results  Component Value Date   TROPONINI <0.03 05/01/2017   TROPONINI 0.03 (HH) 05/01/2017   TROPONINI 0.04 (HH) 04/30/2017   BNP No results for input(s): PROBNP in the  last 8760 hours. TSH  Recent Labs  04/30/17 2125  TSH 1.695   CHOLESTEROL  Recent Labs  05/02/17 1108  CHOL 155    Scheduled Meds: . aspirin EC  81 mg Oral Daily  . atorvastatin  40 mg Oral q1800  . carvedilol  3.125 mg Oral BID WC  . clopidogrel  75 mg Oral Daily  . digoxin  0.125 mg Oral Daily  . losartan  25 mg Oral Daily  . senna  1 tablet Oral Daily  . sodium chloride flush  3 mL Intravenous Q12H  . sodium chloride flush  3 mL Intravenous Q12H  . spironolactone  12.5 mg Oral Daily  . umeclidinium-vilanterol  1 puff Inhalation Daily   Continuous Infusions: . sodium chloride 250 mL (05/08/17 0938)  . sodium chloride    . heparin 1,100 Units/hr (05/09/17 1838)   PRN Meds:.sodium chloride, sodium chloride, acetaminophen **OR** acetaminophen, albuterol, ALPRAZolam, calcium carbonate, ipratropium-albuterol, ondansetron (ZOFRAN) IV, polyethylene glycol, sodium chloride flush, sodium chloride flush, zolpidem  Assessment/Plan: Acute on chronic left heart systolic failure Significant left main coronary artery disease Abnormal troponin I from demand ischemia Severe COPD History of lung cancer History of colon cancer History of skin cancer Mild to moderate right ventricular systolic dysfunction Mild to moderate aortic insufficiency Mild mitral regurgitation Nonsustained VT  Continue Plavix. Increase activity as tolerated.   LOS: 10 days    [name]  MD  05/10/2017, 8:50 AM

## 2017-05-11 DIAGNOSIS — Z85118 Personal history of other malignant neoplasm of bronchus and lung: Secondary | ICD-10-CM

## 2017-05-11 DIAGNOSIS — R51 Headache: Secondary | ICD-10-CM

## 2017-05-11 DIAGNOSIS — F411 Generalized anxiety disorder: Secondary | ICD-10-CM

## 2017-05-11 DIAGNOSIS — Z923 Personal history of irradiation: Secondary | ICD-10-CM

## 2017-05-11 DIAGNOSIS — Z9221 Personal history of antineoplastic chemotherapy: Secondary | ICD-10-CM

## 2017-05-11 DIAGNOSIS — G4700 Insomnia, unspecified: Secondary | ICD-10-CM

## 2017-05-11 DIAGNOSIS — Z79899 Other long term (current) drug therapy: Secondary | ICD-10-CM

## 2017-05-11 DIAGNOSIS — M545 Low back pain: Secondary | ICD-10-CM

## 2017-05-11 LAB — BASIC METABOLIC PANEL
Anion gap: 10 (ref 5–15)
BUN: 6 mg/dL (ref 6–20)
CALCIUM: 9.2 mg/dL (ref 8.9–10.3)
CO2: 23 mmol/L (ref 22–32)
CREATININE: 0.73 mg/dL (ref 0.44–1.00)
Chloride: 103 mmol/L (ref 101–111)
Glucose, Bld: 123 mg/dL — ABNORMAL HIGH (ref 65–99)
Potassium: 3.8 mmol/L (ref 3.5–5.1)
Sodium: 136 mmol/L (ref 135–145)

## 2017-05-11 LAB — CBC
HCT: 40 % (ref 36.0–46.0)
Hemoglobin: 13.3 g/dL (ref 12.0–15.0)
MCH: 28.4 pg (ref 26.0–34.0)
MCHC: 33.3 g/dL (ref 30.0–36.0)
MCV: 85.3 fL (ref 78.0–100.0)
PLATELETS: 190 10*3/uL (ref 150–400)
RBC: 4.69 MIL/uL (ref 3.87–5.11)
RDW: 13.3 % (ref 11.5–15.5)
WBC: 5.7 10*3/uL (ref 4.0–10.5)

## 2017-05-11 LAB — HEPARIN LEVEL (UNFRACTIONATED): HEPARIN UNFRACTIONATED: 0.36 [IU]/mL (ref 0.30–0.70)

## 2017-05-11 MED ORDER — ALBUTEROL SULFATE (2.5 MG/3ML) 0.083% IN NEBU
2.5000 mg | INHALATION_SOLUTION | Freq: Four times a day (QID) | RESPIRATORY_TRACT | Status: DC
  Administered 2017-05-11 (×2): 2.5 mg via RESPIRATORY_TRACT
  Filled 2017-05-11 (×3): qty 3

## 2017-05-11 MED ORDER — SODIUM CHLORIDE 0.9% FLUSH: 3.0000 mL | INTRAVENOUS | Status: DC | PRN

## 2017-05-11 MED ORDER — SODIUM CHLORIDE 0.9% FLUSH: 3.0000 mL | Freq: Two times a day (BID) | INTRAVENOUS | Status: DC

## 2017-05-11 MED ORDER — CLOPIDOGREL BISULFATE 75 MG PO TABS
300.0000 mg | ORAL_TABLET | ORAL | Status: AC
  Administered 2017-05-12: 300 mg via ORAL
  Filled 2017-05-11: qty 4

## 2017-05-11 MED ORDER — SODIUM CHLORIDE 0.9 % IV SOLN
INTRAVENOUS | Status: DC
  Administered 2017-05-12: 05:00:00 via INTRAVENOUS

## 2017-05-11 MED ORDER — SODIUM CHLORIDE 0.9 % IV SOLN: 250.0000 mL | INTRAVENOUS | Status: DC | PRN

## 2017-05-11 MED ORDER — ASPIRIN 81 MG PO CHEW
81.0000 mg | CHEWABLE_TABLET | ORAL | Status: AC
  Administered 2017-05-12: 81 mg via ORAL
  Filled 2017-05-11: qty 1

## 2017-05-11 NOTE — Progress Notes (Signed)
[place] for heparin Indication: chest pain/ACS  No Known Allergies  Patient Measurements: Height: 5\' 5"  (165.1 cm) Weight: 152 lb (68.9 kg) IBW/kg (Calculated) : 57 Heparin Dosing Weight: 68 Kg  Assessment: 51 y.o. female with CAD s/p cath 05/07/17, continuing on heparin while awaiting possible CABG vs. Left main stent, for heparin. Not on anticoagulation PTA. Heparin level remains therapeutic. CBC stable and no bleeding documented.   Goal of Therapy:  Heparin level 0.3-0.7 units/ml Monitor platelets by anticoagulation protocol: Yes   Plan:  Continue heparin gtt at 1100 units/hr Daily heparin level and CBC Monitor for s/s bleeding Plans for left main stent on 9/10   Elicia Lamp, PharmD, BCPS Clinical Pharmacist Rx Phone # for today: [phone] After 3:30PM, please call Main Rx: #[id number] 05/11/2017 10:34 AM

## 2017-05-11 NOTE — Consult Note (Signed)
Ref: Lori Hollow, MD   Subjective:  Had episode of shortness of breath improved with albuterol treatment. Afebrile.  Objective:  Vital Signs in the last 24 hours: Temp:  [98 F (36.7 C)-98.1 F (36.7 C)] 98 F (36.7 C) (09/09 0619) Pulse Rate:  [90-102] 98 (09/09 0619) Cardiac Rhythm: Normal sinus rhythm (09/09 0700) Resp:  [18] 18 (09/09 0619) BP: (93-107)/(60-75) 94/75 (09/09 0619) SpO2:  [95 %-98 %] 98 % (09/09 0619) Weight:  [68.9 kg (152 lb)] 68.9 kg (152 lb) (09/09 4166)  Physical Exam: BP Readings from Last 1 Encounters:  05/11/17 94/75    Wt Readings from Last 1 Encounters:  05/11/17 68.9 kg (152 lb)    Weight change: 0.272 kg (9.6 oz) Body mass index is 25.29 kg/m. HEENT: [place]/AT, Eyes-Brown, Hazel, EOMI, Conjunctiva-Pink, Sclera-Non-icteric Neck: No JVD, No bruit, Trachea midline. Lungs:  Clear, Bilateral. Cardiac:  Regular rhythm, normal S1 and S2, no S3. II/VI systolic murmur. Abdomen:  Soft, non-tender. BS present. Extremities:  No edema present. No cyanosis. No clubbing. CNS: AxOx3, Cranial nerves grossly intact, moves all 4 extremities.  Skin: Warm and dry.   Intake/Output from previous day: 09/08 0701 - 09/09 0700 In: 827 [P.O.:560; I.V.:267] Out: 1450 [Urine:1450]    Lab Results: BMET    Component Value Date/Time   NA 136 05/11/2017 0557   NA 137 05/10/2017 0308   NA 137 05/09/2017 0453   NA 141 12/11/2016 1106   NA 138 03/13/2016 1121   NA 142 11/07/2014 1045   K 3.8 05/11/2017 0557   K 4.0 05/10/2017 0308   K 3.7 05/09/2017 0453   K 3.5 12/11/2016 1106   K 3.9 03/13/2016 1121   K 3.6 11/07/2014 1045   CL 103 05/11/2017 0557   CL 105 05/10/2017 0308   CL 105 05/09/2017 0453   CL 111 (H) 07/09/2012 0958   CL 100 07/09/2011 1413   CO2 23 05/11/2017 0557   CO2 25 05/10/2017 0308   CO2 25 05/09/2017 0453   CO2 22 12/11/2016 1106   CO2 26 03/13/2016 1121   CO2 24 11/07/2014 1045   GLUCOSE 123 (H) 05/11/2017 0557   GLUCOSE 92  05/10/2017 0308   GLUCOSE 93 05/09/2017 0453   GLUCOSE 84 12/11/2016 1106   GLUCOSE 87 03/13/2016 1121   GLUCOSE 75 11/07/2014 1045   GLUCOSE 80 07/09/2012 0958   GLUCOSE 96 07/09/2011 1413   BUN 6 05/11/2017 0557   BUN 8 05/10/2017 0308   BUN 10 05/09/2017 0453   BUN 8.1 12/11/2016 1106   BUN 10.7 03/13/2016 1121   BUN 8.7 11/07/2014 1045   CREATININE 0.73 05/11/2017 0557   CREATININE 0.79 05/10/2017 0308   CREATININE 0.69 05/09/2017 0453   CREATININE 0.8 12/11/2016 1106   CREATININE 0.8 03/13/2016 1121   CREATININE 0.8 11/07/2014 1045   CALCIUM 9.2 05/11/2017 0557   CALCIUM 9.1 05/10/2017 0308   CALCIUM 9.3 05/09/2017 0453   CALCIUM 9.0 12/11/2016 1106   CALCIUM 8.8 03/13/2016 1121   CALCIUM 8.9 11/07/2014 1045   GFRNONAA >60 05/11/2017 0557   GFRNONAA >60 05/10/2017 0308   GFRNONAA >60 05/09/2017 0453   GFRAA >60 05/11/2017 0557   GFRAA >60 05/10/2017 0308   GFRAA >60 05/09/2017 0453   CBC    Component Value Date/Time   WBC 5.7 05/11/2017 0557   RBC 4.69 05/11/2017 0557   HGB 13.3 05/11/2017 0557   HGB 13.6 12/11/2016 1106   HCT 40.0 05/11/2017 0557   HCT 39.1  12/11/2016 1106   PLT 190 05/11/2017 0557   PLT 221 12/11/2016 1106   MCV 85.3 05/11/2017 0557   MCV 84.4 12/11/2016 1106   MCH 28.4 05/11/2017 0557   MCHC 33.3 05/11/2017 0557   RDW 13.3 05/11/2017 0557   RDW 13.5 12/11/2016 1106   LYMPHSABS 1.0 04/30/2017 1627   LYMPHSABS 1.3 12/11/2016 1106   MONOABS 0.5 04/30/2017 1627   MONOABS 0.4 12/11/2016 1106   EOSABS 0.0 04/30/2017 1627   EOSABS 0.1 12/11/2016 1106   BASOSABS 0.0 04/30/2017 1627   BASOSABS 0.1 12/11/2016 1106   HEPATIC Function Panel  Recent Labs  12/11/16 1106  PROT 7.5   HEMOGLOBIN A1C No components found for: HGA1C,  MPG CARDIAC ENZYMES Lab Results  Component Value Date   TROPONINI <0.03 05/01/2017   TROPONINI 0.03 (HH) 05/01/2017   TROPONINI 0.04 (HH) 04/30/2017   BNP No results for input(s): PROBNP in the last 8760  hours. TSH  Recent Labs  04/30/17 2125  TSH 1.695   CHOLESTEROL  Recent Labs  05/02/17 1108  CHOL 155    Scheduled Meds: . aspirin EC  81 mg Oral Daily  . atorvastatin  40 mg Oral q1800  . carvedilol  3.125 mg Oral BID WC  . clopidogrel  75 mg Oral Daily  . digoxin  0.125 mg Oral Daily  . losartan  25 mg Oral Daily  . senna  1 tablet Oral Daily  . sodium chloride flush  3 mL Intravenous Q12H  . sodium chloride flush  3 mL Intravenous Q12H  . spironolactone  12.5 mg Oral Daily  . umeclidinium-vilanterol  1 puff Inhalation Daily   Continuous Infusions: . sodium chloride 250 mL (05/08/17 0938)  . sodium chloride    . heparin 1,100 Units/hr (05/10/17 2137)   PRN Meds:.sodium chloride, sodium chloride, acetaminophen **OR** acetaminophen, albuterol, ALPRAZolam, calcium carbonate, ipratropium-albuterol, ondansetron (ZOFRAN) IV, polyethylene glycol, sodium chloride flush, sodium chloride flush, zolpidem  Assessment/Plan: Acute on chronic left heart systolic failure Significant left main coronary artery disease Abnormal troponin I from demand ischemia Severe COPD History of lung cancer History of colon cancer History of skin cancer Mild to moderate right ventricular systolic dysfunction Mild to moderate aortic valve insufficiency Mild mitral regurgitation Nonsustained VT  Start albuterol nebulizer treatment every 6 hours. Left main angioplasty tomorrow.   LOS: 11 days    Lori Dials  MD  05/11/2017, 8:46 AM

## 2017-05-11 NOTE — Consult Note (Signed)
Reason for Consult: Unprotected Left main stenting Referring Physician: [facility] hospitalist  [name] is an 51 y.o. female.  HPI: Please see prior consult note of Dr. Doylene Canard . 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 EKG showed diffuse T-wave inversion in anterolateral and inferior leads and was noted to have minimally elevated troponin I, 2-D echo done showed severe depressed LV systolic function with EF of 20-25% , subsequently patient underwent right and left cardiac catheterization which showed 60-70% ostial left main stenosis. CVTS consultation was obtained for possible evaluation for CABG but due to severe COPD and multiple rounds of radiation in the past and favorable location of left main stenosis was felt to be a better candidate for percutaneous intervention. Patient presently denies any chest pain had episode of shortness of breath which improved after Lasix and breathing treatment. Past Medical History:  Diagnosis Date  . 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 Allergic rhinitis   . Allergy   . Anxiety disorder   . Asthma   . Chronic bronchitis   . Colon cancer ([place])    adenocarcinoma in a polyps 11-24-2014  . COPD (chronic obstructive pulmonary disease) ([place])   . Cough   . Dizziness and giddiness   . Dyspepsia   . Emphysema of lung ([place])   . External hemorrhoids   . EXTERNAL HEMORRHOIDS 09/27/2009  . Female stress incontinence   .  GERD (gastroesophageal reflux disease)    occ depending on diet   . Goiter   . History of lung cancer   . Hypercholesteremia    denies-last check normal labs  . 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 Insomnia   . Lung cancer ([place])   . Migraine, unspecified, without mention of intractable migraine without mention of status migrainosus   . Panic attacks   . Seizures ([place])    very long ago like 15 years ago or so , questionable etilogy  . Skin cancer (melanoma) ([place])    right calf    Past Surgical History:  Procedure Laterality Date  . biopsy of right neck mass     negative-thyroid biopsy   . BREAST EXCISIONAL BIOPSY Right 1987   no visible scar  . BRONCHOSCOPY  01/31/2005   nonspecific inflammation  . BRONCHOSCOPY  08/24/2010   nonspecific inflammation  . COLONOSCOPY    . LUNG REMOVAL, PARTIAL     for lung cancer--followed by Dr. Arlyce Dice and Dr. Annamaria Boots  . MELANOMA EXCISION WITH SENTINEL LYMPH NODE BIOPSY Right 11/29/2014   Procedure: RIGHT INGUINAL SENTINEL LYMPH NODE BIOPSY;  Surgeon: Georganna Skeans, MD;  Location: [facility];  Service: General;  Laterality: Right;  . melanoma removal     right calf   . POLYPECTOMY    . portacath removed  10/2007  . RIGHT/LEFT HEART CATH AND CORONARY ANGIOGRAPHY N/A 05/06/2017   Procedure: RIGHT/LEFT HEART CATH AND CORONARY ANGIOGRAPHY;  Surgeon: [name], MD;  Location: [facility];  Service: Cardiovascular;  Laterality: N/A;  . SPIROMETRY  07/03/2002   min obstruction,mild restriction 01/31/2005  . tvh  12/01/2000   hysterectomy    Family History  Problem Relation Age of Onset  . Lung cancer Father        smoker and worked at a [employer]  . COPD Mother        not a smoker  . Other Mother        hx of hysterectomy for unspecified reason  . Asthma Daughter   . Diabetes Maternal Grandfather   . Colon polyps Brother        approx 16 polyps on his first colonoscopy  . Other Maternal Aunt         non-cancerous growth in lungs; respiratory issues; smoker  . COPD Maternal Grandmother        d. 60  . Cancer Paternal Grandfather        oral/mouth cancer; chewed tobacco; d. older age  . Throat cancer Other        maternal great aunt (MGF's sister); not a smoker  . Cirrhosis Paternal Uncle        hx of alcohol abuse   . Colon cancer Neg Hx   . Rectal cancer Neg Hx   . Stomach cancer Neg Hx     Social History:  reports that she quit smoking about 15 years ago. Her smoking use included Cigarettes. She started smoking about 38 years ago. She has a 46.00 pack-year smoking history. She has never used smokeless tobacco. She reports that she does not drink alcohol or use drugs.  Allergies: No Known Allergies  Medications: I have reviewed the patient's current medications.  Results for orders placed or performed during the hospital encounter of 04/30/17 (from the past 48 hour(s))  Heparin level (unfractionated)     Status: Abnormal   Collection Time: 05/09/17 12:48 PM  Result Value Ref Range   Heparin Unfractionated >2.20 (H) 0.30 - 0.70 IU/mL    Comment: RESULTS CONFIRMED BY MANUAL DILUTION        IF HEPARIN RESULTS ARE BELOW EXPECTED VALUES, AND PATIENT DOSAGE HAS BEEN CONFIRMED, SUGGEST FOLLOW UP TESTING OF ANTITHROMBIN III LEVELS.   Heparin level (unfractionated)     Status: None   Collection Time: 05/09/17  3:21 PM  Result Value Ref Range   Heparin Unfractionated 0.48 0.30 - 0.70 IU/mL    Comment:        IF HEPARIN RESULTS ARE BELOW EXPECTED VALUES, AND PATIENT DOSAGE HAS BEEN CONFIRMED, SUGGEST FOLLOW UP TESTING OF ANTITHROMBIN III LEVELS.   CBC     Status: None   Collection Time: 05/10/17  3:08 AM  Result Value Ref Range   WBC 5.8 4.0 - 10.5 K/uL   RBC 4.33 3.87 - 5.11 MIL/uL   Hemoglobin 12.4 12.0 - 15.0 g/dL   HCT 37.0 36.0 - 46.0 %   MCV 85.5 78.0 - 100.0 fL   MCH 28.6 26.0 - 34.0 pg   MCHC 33.5 30.0 - 36.0 g/dL   RDW 13.2 11.5 - 15.5 %   Platelets 169 150 -  400 K/uL  Heparin level (unfractionated)     Status: None   Collection Time: 05/10/17  3:08 AM  Result Value Ref Range   Heparin Unfractionated 0.42 0.30 - 0.70 IU/mL    Comment:        IF HEPARIN RESULTS ARE BELOW EXPECTED VALUES, AND PATIENT DOSAGE HAS BEEN CONFIRMED, SUGGEST FOLLOW UP TESTING OF ANTITHROMBIN III LEVELS.  Basic metabolic panel     Status: None   Collection Time: 05/10/17  3:08 AM  Result Value Ref Range   Sodium 137 135 - 145 mmol/L   Potassium 4.0 3.5 - 5.1 mmol/L   Chloride 105 101 - 111 mmol/L   CO2 25 22 - 32 mmol/L   Glucose, Bld 92 65 - 99 mg/dL   BUN 8 6 - 20 mg/dL   Creatinine, Ser 0.79 0.44 - 1.00 mg/dL   Calcium 9.1 8.9 - 10.3 mg/dL   GFR calc non Af Amer >60 >60 mL/min   GFR calc Af Amer >60 >60 mL/min    Comment: (NOTE) The eGFR has been calculated using the CKD EPI equation. This calculation has not been validated in all clinical situations. eGFR's persistently <60 mL/min signify possible Chronic Kidney Disease.    Anion gap 7 5 - 15  CBC     Status: None   Collection Time: 05/11/17  5:57 AM  Result Value Ref Range   WBC 5.7 4.0 - 10.5 K/uL   RBC 4.69 3.87 - 5.11 MIL/uL   Hemoglobin 13.3 12.0 - 15.0 g/dL   HCT 40.0 36.0 - 46.0 %   MCV 85.3 78.0 - 100.0 fL   MCH 28.4 26.0 - 34.0 pg   MCHC 33.3 30.0 - 36.0 g/dL   RDW 13.3 11.5 - 15.5 %   Platelets 190 150 - 400 K/uL  Heparin level (unfractionated)     Status: None   Collection Time: 05/11/17  5:57 AM  Result Value Ref Range   Heparin Unfractionated 0.36 0.30 - 0.70 IU/mL    Comment:        IF HEPARIN RESULTS ARE BELOW EXPECTED VALUES, AND PATIENT DOSAGE HAS BEEN CONFIRMED, SUGGEST FOLLOW UP TESTING OF ANTITHROMBIN III LEVELS.   Basic metabolic panel     Status: Abnormal   Collection Time: 05/11/17  5:57 AM  Result Value Ref Range   Sodium 136 135 - 145 mmol/L   Potassium 3.8 3.5 - 5.1 mmol/L   Chloride 103 101 - 111 mmol/L   CO2 23 22 - 32 mmol/L   Glucose, Bld 123 (H) 65 -  99 mg/dL   BUN 6 6 - 20 mg/dL   Creatinine, Ser 0.73 0.44 - 1.00 mg/dL   Calcium 9.2 8.9 - 10.3 mg/dL   GFR calc non Af Amer >60 >60 mL/min   GFR calc Af Amer >60 >60 mL/min    Comment: (NOTE) The eGFR has been calculated using the CKD EPI equation. This calculation has not been validated in all clinical situations. eGFR's persistently <60 mL/min signify possible Chronic Kidney Disease.    Anion gap 10 5 - 15    No results found.  Review of Systems  Constitutional: Negative for chills and fever.  HENT: Negative for hearing loss.   Respiratory: Positive for shortness of breath.   Cardiovascular: Positive for leg swelling. Negative for chest pain.  Gastrointestinal: Negative for abdominal pain, nausea and vomiting.  Genitourinary: Negative for dysuria.  Neurological: Positive for dizziness and weakness.   Blood pressure 94/75, pulse 98, temperature 98 F (36.7 C), temperature source Oral, resp. rate 18, height _0  (1.651 m), weight 68.9 kg (152 lb), SpO2 98 %. Physical Exam  HENT:  Head: Normocephalic and atraumatic.  Eyes: Pupils are equal, round, and reactive to light. Conjunctivae are normal. Left eye exhibits no discharge. No scleral icterus.  Neck: Normal range of motion. Neck supple. No JVD present. No tracheal deviation present.  No thyromegaly present.  Cardiovascular: Normal rate and regular rhythm.   Murmur (Soft systolic and diastolic murmur noted) heard. Respiratory: Effort normal and breath sounds normal. No respiratory distress. She has no wheezes. She has no rales.  GI: Soft. Bowel sounds are normal. She exhibits no distension. There is no tenderness. There is no rebound.  Musculoskeletal:  No clubbing cyanosis trace edema noted. Right groin dressing dry and mild ecchymosis noted. 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 death MI stroke  local vascular complications risk of restenosis etc. and consents for PCI.   Charolette Forward 05/11/2017, 11:25 AM

## 2017-05-11 NOTE — Progress Notes (Signed)
Family Medicine Teaching Service Daily Progress Note Intern Pager: [phone]  Patient name: [name] Medical record number: [id number] Date of birth: 1965-10-29 Age: 51 y.o. Gender: female  Primary Care Provider: Bonnita Hollow, MD Consultants: cardiology  Code Status: FULL  Assessment and Plan:  Dyspnea 2/2 new onset CHF found to have significant LM CAD on R/LHC:  Shortness of breath has resolved. Weight stable ~151-152 lbs. Net negative 13 L since admission; off lasix since 8/29. New onset CHF with EF 20-25% and diffuse hypokinesis. Patient received [facility] 9/4, with significant LM disease 60% stenosed and proximal RCA 15% stenosed. Not felt to be a candidate for CABG given her severe COPD. Plan for LM angioplasty by Dr. Terrence Dupont 9/11. Currently on heparin gtt pending stenting. Loaded with Plavix 9/7, to continue 75 mg daily thereafter. No chest pain or shortness of breath.  - cardiac monitoring - Incentive spirometry - Anoro per pulm - Albuterolq6 prn for breakthrough SOB, wheezing - hold lasix, consider restarting if respiratory status worsens - atorvastatin 40mg  - continue coreg 3.125mg , digoxin 0.125mg  daily per cards, BP soft and cannot tolerate additional spironolacte  COPD:  Stable. Denies any shortness of breath or complaints today. - Continue Anoro Ellipta  H/o Non-small cell cancer of right lung: Thoracotomy 1/3 right lobe, chemo 5 years, radiation for 1 year, dx 2003 - Followed closely by Dr. Baird Lyons, Pulmonology  Headaches: Has chronic headaches. Reports headache this morning, similar to previous headaches. No alarm symptoms.  - Tylenol 650mg  prn for pain  Insomnia: Uses ambien 2-3 times per week, usually takes xanax twice per day - Continue home ambien as required for sleep  GAD. Chronic. Pt endorses feeling anxious in hospital. Takes 1mg  two times a day prn.  -cont home xanax -on outpatient follow up, may need additional controlled  anxiety medication  FEN/GI: Regular diet Prophylaxis: Plavix, ASA, Heparin, dose per pharmacy  Disposition: Anticipate discharge home in 2 days  Subjective:  Doing well this morning. Denies any chest pain or shortness of breath. Reports walking the halls yesterday without issue. Does report mild headache this morning. Feels like chronic headaches. Denies any alarm symptoms.   Objective: Temp:  [98 F (36.7 C)-98.5 F (36.9 C)] 98.5 F (36.9 C) (09/09 1123) Pulse Rate:  [70-100] 70 (09/09 1123) Resp:  [18-20] 20 (09/09 1123) BP: (93-105)/(60-75) 95/65 (09/09 1123) SpO2:  [94 %-98 %] 94 % (09/09 1123) Weight:  [152 lb (68.9 kg)] 152 lb (68.9 kg) (09/09 9476) Physical Exam: General: well developed, well nourished female, sitting up in bed in no acute distress Cardiovascular: RRR, no m/r/g Respiratory: CTAB Abdomen: soft, non-tender, BS+ Extremities: warm, dry, no rashes  Laboratory:  Recent Labs Lab 05/09/17 0453 05/10/17 0308 05/11/17 0557  WBC 4.9 5.8 5.7  HGB 13.2 12.4 13.3  HCT 39.4 37.0 40.0  PLT 179 169 190    Recent Labs Lab 05/09/17 0453 05/10/17 0308 05/11/17 0557  NA 137 137 136  K 3.7 4.0 3.8  CL 105 105 103  CO2 25 25 23   BUN 10 8 6   CREATININE 0.69 0.79 0.73  CALCIUM 9.3 9.1 9.2  GLUCOSE 93 92 123*    Maryellen Pile, MD PGY-3 05/11/2017, 11:42 AM FPTS Intern pager: [phone], text pages welcome

## 2017-05-12 ENCOUNTER — Encounter (HOSPITAL_COMMUNITY): Payer: Self-pay | Admitting: Cardiology

## 2017-05-12 ENCOUNTER — Encounter (HOSPITAL_COMMUNITY)
Admission: EM | Disposition: A | Discharge status: Home / Self Care | Payer: Self-pay | Source: Home / Self Care | Attending: Family Medicine

## 2017-05-12 ENCOUNTER — Inpatient Hospital Stay (HOSPITAL_COMMUNITY)
Admission: EM | Disposition: A | Discharge status: Home / Self Care | Payer: Self-pay | Source: Home / Self Care | Attending: Family Medicine

## 2017-05-12 ENCOUNTER — Other Ambulatory Visit: Payer: Self-pay

## 2017-05-12 HISTORY — PX: CORONARY STENT INTERVENTION: CATH118234

## 2017-05-12 LAB — CBC
HCT: 37.9 % (ref 36.0–46.0)
HEMOGLOBIN: 12.8 g/dL (ref 12.0–15.0)
MCH: 28.8 pg (ref 26.0–34.0)
MCHC: 33.8 g/dL (ref 30.0–36.0)
MCV: 85.2 fL (ref 78.0–100.0)
PLATELETS: 180 10*3/uL (ref 150–400)
RBC: 4.45 MIL/uL (ref 3.87–5.11)
RDW: 13.3 % (ref 11.5–15.5)
WBC: 5.3 10*3/uL (ref 4.0–10.5)

## 2017-05-12 LAB — BASIC METABOLIC PANEL
ANION GAP: 6 (ref 5–15)
BUN: 11 mg/dL (ref 6–20)
CALCIUM: 9.1 mg/dL (ref 8.9–10.3)
CO2: 28 mmol/L (ref 22–32)
Chloride: 104 mmol/L (ref 101–111)
Creatinine, Ser: 0.75 mg/dL (ref 0.44–1.00)
Glucose, Bld: 95 mg/dL (ref 65–99)
Potassium: 4.2 mmol/L (ref 3.5–5.1)
Sodium: 138 mmol/L (ref 135–145)

## 2017-05-12 LAB — HEPARIN LEVEL (UNFRACTIONATED): Heparin Unfractionated: 0.42 IU/mL (ref 0.30–0.70)

## 2017-05-12 LAB — POCT ACTIVATED CLOTTING TIME: ACTIVATED CLOTTING TIME: 323 s

## 2017-05-12 SURGERY — CORONARY ARTERY BYPASS GRAFTING (CABG)
Anesthesia: General | Site: Chest

## 2017-05-12 SURGERY — CORONARY STENT INTERVENTION
Anesthesia: LOCAL

## 2017-05-12 MED ORDER — LIDOCAINE HCL 2 % IJ SOLN
INTRAMUSCULAR | Status: AC
  Filled 2017-05-12: qty 10

## 2017-05-12 MED ORDER — NITROGLYCERIN 1 MG/10 ML FOR IR/CATH LAB
INTRA_ARTERIAL | Status: DC | PRN
Start: 2017-05-12 — End: 2017-05-12
  Administered 2017-05-12: 100 ug via INTRACORONARY

## 2017-05-12 MED ORDER — SODIUM CHLORIDE 0.9 % IV SOLN
INTRAVENOUS | Status: DC
  Administered 2017-05-12: 14:00:00 via INTRAVENOUS

## 2017-05-12 MED ORDER — SODIUM CHLORIDE 0.9 % IV SOLN
INTRAVENOUS | Status: AC | PRN
  Administered 2017-05-12: 10 mL/h via INTRAVENOUS

## 2017-05-12 MED ORDER — LIDOCAINE HCL (PF) 1 % IJ SOLN
INTRAMUSCULAR | Status: DC | PRN
Start: 2017-05-12 — End: 2017-05-12
  Administered 2017-05-12: 13 mL
  Administered 2017-05-12: 18 mL

## 2017-05-12 MED ORDER — MORPHINE SULFATE (PF) 4 MG/ML IV SOLN
2.0000 mg | Freq: Once | INTRAVENOUS | Status: AC
  Administered 2017-05-12: 16:00:00 2 mg via INTRAVENOUS
  Filled 2017-05-12: qty 1

## 2017-05-12 MED ORDER — FENTANYL CITRATE (PF) 100 MCG/2ML IJ SOLN
INTRAMUSCULAR | Status: AC
  Filled 2017-05-12: qty 2

## 2017-05-12 MED ORDER — BIVALIRUDIN TRIFLUOROACETATE 250 MG IV SOLR
INTRAVENOUS | Status: AC
  Filled 2017-05-12: qty 250

## 2017-05-12 MED ORDER — HEPARIN (PORCINE) IN NACL 2-0.9 UNIT/ML-% IJ SOLN
INTRAMUSCULAR | Status: AC
  Filled 2017-05-12: qty 1000

## 2017-05-12 MED ORDER — ANGIOPLASTY BOOK
Freq: Once | Status: AC
  Administered 2017-05-12: 22:00:00
  Filled 2017-05-12: qty 1

## 2017-05-12 MED ORDER — MIDAZOLAM HCL 2 MG/2ML IJ SOLN
INTRAMUSCULAR | Status: DC | PRN
  Administered 2017-05-12 (×2): 1 mg via INTRAVENOUS

## 2017-05-12 MED ORDER — IOPAMIDOL (ISOVUE-370) INJECTION 76%
INTRAVENOUS | Status: AC
  Filled 2017-05-12: qty 125

## 2017-05-12 MED ORDER — SODIUM CHLORIDE 0.9 % IV SOLN: 250.0000 mL | INTRAVENOUS | Status: DC | PRN

## 2017-05-12 MED ORDER — OXYCODONE-ACETAMINOPHEN 5-325 MG PO TABS
1.0000 | ORAL_TABLET | ORAL | Status: DC | PRN
  Administered 2017-05-12: 15:00:00 1 via ORAL
  Filled 2017-05-12: qty 1

## 2017-05-12 MED ORDER — NITROGLYCERIN 1 MG/10 ML FOR IR/CATH LAB
INTRA_ARTERIAL | Status: AC
  Filled 2017-05-12: qty 10

## 2017-05-12 MED ORDER — ACETAMINOPHEN 325 MG PO TABS
650.0000 mg | ORAL_TABLET | ORAL | Status: DC | PRN
  Administered 2017-05-12: 650 mg via ORAL
  Filled 2017-05-12: qty 2

## 2017-05-12 MED ORDER — NITROGLYCERIN IN D5W 200-5 MCG/ML-% IV SOLN: 5.0000 ug/min | INTRAVENOUS | Status: DC

## 2017-05-12 MED ORDER — IOPAMIDOL (ISOVUE-370) INJECTION 76%
INTRAVENOUS | Status: AC
  Filled 2017-05-12: qty 50

## 2017-05-12 MED ORDER — MORPHINE SULFATE (PF) 10 MG/ML IV SOLN: 2.0000 mg | Freq: Once | INTRAVENOUS | Status: DC

## 2017-05-12 MED ORDER — FENTANYL CITRATE (PF) 100 MCG/2ML IJ SOLN
INTRAMUSCULAR | Status: DC | PRN
  Administered 2017-05-12 (×4): 25 ug via INTRAVENOUS

## 2017-05-12 MED ORDER — SODIUM CHLORIDE 0.9 % IV SOLN
INTRAVENOUS | Status: DC | PRN
  Administered 2017-05-12: 1.75 mg/kg/h via INTRAVENOUS

## 2017-05-12 MED ORDER — SODIUM CHLORIDE 0.9% FLUSH: 3.0000 mL | INTRAVENOUS | Status: DC | PRN

## 2017-05-12 MED ORDER — SODIUM CHLORIDE 0.9% FLUSH
3.0000 mL | Freq: Two times a day (BID) | INTRAVENOUS | Status: DC
  Administered 2017-05-12 – 2017-05-13 (×2): 3 mL via INTRAVENOUS

## 2017-05-12 MED ORDER — IOPAMIDOL (ISOVUE-370) INJECTION 76%
INTRAVENOUS | Status: DC | PRN
  Administered 2017-05-12: 135 mL via INTRA_ARTERIAL

## 2017-05-12 MED ORDER — BIVALIRUDIN BOLUS VIA INFUSION - CUPID
INTRAVENOUS | Status: DC | PRN
  Administered 2017-05-12: 51.15 mg via INTRAVENOUS

## 2017-05-12 MED ORDER — MIDAZOLAM HCL 2 MG/2ML IJ SOLN
INTRAMUSCULAR | Status: AC
  Filled 2017-05-12: qty 2

## 2017-05-12 MED ORDER — HEPARIN (PORCINE) IN NACL 2-0.9 UNIT/ML-% IJ SOLN
INTRAMUSCULAR | Status: AC | PRN
  Administered 2017-05-12: 1000 mL

## 2017-05-12 SURGICAL SUPPLY — 25 items
BALLN EMERGE MR 3.0X8 (BALLOONS) ×2
BALLN ~~LOC~~ EMERGE MR 4.5X8 (BALLOONS) ×2
BALLN ~~LOC~~ EMERGE MR 5.0X8 (BALLOONS) ×2
BALLOON EMERGE MR 3.0X8 (BALLOONS) ×1 IMPLANT
BALLOON ~~LOC~~ EMERGE MR 4.5X8 (BALLOONS) ×1 IMPLANT
BALLOON ~~LOC~~ EMERGE MR 5.0X8 (BALLOONS) ×1 IMPLANT
CATH INFINITI 5FR ANG PIGTAIL (CATHETERS) ×2 IMPLANT
CATH OPTICROSS 40MHZ (CATHETERS) ×2 IMPLANT
CATH VISTA GUIDE 6FR XBLAD3.0 (CATHETERS) ×2 IMPLANT
DEVICE CLOSURE PERCLS PRGLD 6F (VASCULAR PRODUCTS) ×2 IMPLANT
ELECT DEFIB PAD ADLT CADENCE (PAD) ×2 IMPLANT
HOVERMATT SINGLE USE (MISCELLANEOUS) ×2 IMPLANT
KIT ENCORE 26 ADVANTAGE (KITS) ×2 IMPLANT
KIT HEART LEFT (KITS) ×2 IMPLANT
NEEDLE SMART REG 18GX2-3/4 (NEEDLE) ×2 IMPLANT
PACK CARDIAC CATHETERIZATION (CUSTOM PROCEDURE TRAY) ×2 IMPLANT
PERCLOSE PROGLIDE 6F (VASCULAR PRODUCTS) ×4
SET IMPELLA CP PUMP (CATHETERS) ×2 IMPLANT
SHEATH PINNACLE 6F 10CM (SHEATH) ×4 IMPLANT
SLED PULL BACK IVUS (MISCELLANEOUS) ×2 IMPLANT
STENT SIERRA 4.00 X 15 MM (Permanent Stent) ×2 IMPLANT
TRANSDUCER W/STOPCOCK (MISCELLANEOUS) ×2 IMPLANT
TUBING CIL FLEX 10 FLL-RA (TUBING) ×2 IMPLANT
WIRE EMERALD 3MM-J .035X150CM (WIRE) ×2 IMPLANT
WIRE HI TORQ BMW 190CM (WIRE) ×2 IMPLANT

## 2017-05-12 NOTE — Progress Notes (Signed)
[place] for heparin Indication: chest pain/ACS  No Known Allergies  Patient Measurements: Height: 5\' 5"  (165.1 cm) Weight: 150 lb 6.4 oz (68.2 kg) (scale c) IBW/kg (Calculated) : 57 Heparin Dosing Weight: 68 Kg  Assessment: 51 y.o. female with CAD s/p cath 05/07/17, on hep gtt for ACS s/p cath possible intervention. Heparin level is therapeutic at 0.42 today. CBC stable, WNL  Goal of Therapy:  Heparin level 0.3-0.7 units/ml Monitor platelets by anticoagulation protocol: Yes   Plan:  Continue heparin gtt at 1100 units/hr Monitor daily heparin level, CBC, s/s of bleed Follow up stent placement today  Elenor Quinones, PharmD, BCPS Clinical Pharmacist Pager [phone] 05/12/2017 7:38 AM

## 2017-05-12 NOTE — Progress Notes (Signed)
Family Medicine Teaching Service Daily Progress Note Intern Pager: [phone]  Patient name: [name] Medical record number: [id number] Date of birth: 15-Feb-1966 Age: 51 y.o. Gender: female  Primary Care Provider: Rory Percy, DO Consultants: cardiology  Code Status: FULL  Assessment and Plan:  Dyspnea 2/2 new onset CHF found to have significant LM CAD on R/LHC:  Shortness of breath has improved with diuresis. Weight stable ~150-152 lbs. Net negative 14 L since admission; off lasix since 8/29. New onset CHF with EF 20-25% and diffuse hypokinesis. Patient received [facility] 9/4, with significant LM disease 60% stenosed and proximal RCA 15% stenosed. Not felt to be a candidate for CABG given her severe COPD seen on PFTs. Plan for LM angioplasty by Dr. Terrence Dupont 9/10. Currently on heparin gtt pending stenting. Loaded with Plavix 9/7, to continue 75 mg daily thereafter. Received albuterol neb for 3 doses yesterday per cards, no chest pain or shortness of breath on exam today. BPs continue to remain soft throughout this admission, continue to monitor.  - LM stenting today 9/10 - cardiac monitoring - Incentive spirometry - Anoro per pulm - Albuterolq6 prn for breakthrough SOB, wheezing - hold lasix, consider restarting if respiratory status worsens - atorvastatin 40mg  - continue coreg 3.125mg , digoxin 0.125mg , spironolactone 12.5mg  per cards - daily CBC, BMP - wnl  COPD:  Stable. Denies any shortness of breath or complaints today. - Continue Anoro Ellipta - Albuterolq6 prn for breakthrough SOB, wheezing  H/o Non-small cell cancer of right lung: Thoracotomy 1/3 right lobe, chemo 5 years, radiation for 1 year, dx 2003 - Followed closely by Dr. Baird Lyons, Pulmonology  Headaches: Chronic, stable. No complaints today. - Tylenol 650mg  prn for pain  Insomnia: Uses ambien 2-3 times per week, usually takes xanax twice per day - Continue home ambien as required for  sleep  GAD. Chronic. Takes Xanax 1mg  two times a day prn.  -cont home xanax -on outpatient follow up, may need additional controlled anxiety medication  FEN/GI: NPO prior to stenting, then regular diet Prophylaxis: Plavix, ASA, Heparin, dose per pharmacy  Disposition: Anticipate discharge home in 2 days s/p stent  Subjective:  Doing well this morning. Denies any chest pain or shortness of breath. Reported increased tolerance with walking. Ready for stenting procedure to be done so she can eat.   Objective: Temp:  [97.8 F (36.6 C)-98.5 F (36.9 C)] 97.8 F (36.6 C) (09/10 0500) Pulse Rate:  [70-99] 88 (09/10 0824) Resp:  [18-20] 18 (09/10 0824) BP: (89-101)/(43-65) 89/51 (09/10 0500) SpO2:  [94 %-99 %] 97 % (09/10 0824) Weight:  [150 lb 6.4 oz (68.2 kg)] 150 lb 6.4 oz (68.2 kg) (09/10 0500)  Physical Exam: General: well developed, well nourished female, in no acute distress Cardiovascular: RRR, no m/r/g Respiratory: CTAB, no wheezes Abdomen: soft, non-tender, BS+ Extremities: warm, dry, trace LE edema  Laboratory:  Recent Labs Lab 05/10/17 0308 05/11/17 0557 05/12/17 0433  WBC 5.8 5.7 5.3  HGB 12.4 13.3 12.8  HCT 37.0 40.0 37.9  PLT 169 190 180    Recent Labs Lab 05/10/17 0308 05/11/17 0557 05/12/17 0433  NA 137 136 138  K 4.0 3.8 4.2  CL 105 103 104  CO2 25 23 28   BUN 8 6 11   CREATININE 0.79 0.73 0.75  CALCIUM 9.1 9.2 9.1  GLUCOSE 92 123* 95    Rory Percy, DO PGY-1 05/12/2017, 9:00 AM FPTS Intern pager: [phone], text pages welcome

## 2017-05-12 NOTE — Progress Notes (Signed)
Slight hematoma and ecchymosis present at right groin site, proximal to sheath insertion site.  2inch by 2 inch . This was present prior to sheath removal. Groin level 1.   52fr sheath aspirated and removed from right femoral artery. Manual pressure applied for 45 minutes. Patient did have a lot of pain during the groin hold. Site level still 1 due to ecchymosis from prior cath procedure.  Tegaderm dressing applied, then 51fr left groin arterial sheath removed. Manual pressure applied, then relieved by Jamey Reas.  Right dp and pt pulses weak but palpable.  Bedrest for right groin begins at 16:00:00  .

## 2017-05-12 NOTE — Progress Notes (Signed)
FPTS Interim Progress Note  S: Patient reports no problems after surgery, just removal of the the right femoral line hurt while it was being removed.  O: BP 113/78   Pulse 89   Temp 97.8 F (36.6 C) (Oral)   Resp 20   Ht 5\' 5"  (1.651 m)   Wt 150 lb 6.4 oz (68.2 kg) Comment: scale c  SpO2 92%   BMI 25.03 kg/m   General: NAD, laying bed on 2L [place] Card: RRR Resp: CTAB Extremities: able to move feet easily  A/P: Patient doing well after stent placement.  Shereese Bonnie, [place], DO 05/12/2017, 5:45 PM PGY-1, [facility] Medicine Service pager [phone]

## 2017-05-12 NOTE — Care Management Note (Signed)
Case Management Note  Patient Details  Name: [name] MRN: [id number] Date of Birth: 08/12/1966  Subjective/Objective:    From home, s/p  Coronary stent intervention, will be on plavix.                 Action/Plan: NCM will follow for dc needs.   Expected Discharge Date:  05/02/17               Expected Discharge Plan:  Home/Self Care  In-House Referral:     Discharge planning Services  CM Consult  Post Acute Care Choice:    Choice offered to:     DME Arranged:    DME Agency:     HH Arranged:    HH Agency:     Status of Service:  Completed, signed off  If discussed at [employer] of Stay Meetings, dates discussed:    Additional Comments:  Zenon Mayo, RN 05/12/2017, 5:27 PM

## 2017-05-12 NOTE — H&P (View-Only) (Signed)
Reason for Consult: Unprotected Left main stenting Referring Physician: [facility] hospitalist  [name] is an 51 y.o. female.  HPI: Please see prior consult note of Dr. Doylene Canard . 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 EKG showed diffuse T-wave inversion in anterolateral and inferior leads and was noted to have minimally elevated troponin I, 2-D echo done showed severe depressed LV systolic function with EF of 20-25% , subsequently patient underwent right and left cardiac catheterization which showed 60-70% ostial left main stenosis. CVTS consultation was obtained for possible evaluation for CABG but due to severe COPD and multiple rounds of radiation in the past and favorable location of left main stenosis was felt to be a better candidate for percutaneous intervention. Patient presently denies any chest pain had episode of shortness of breath which improved after Lasix and breathing treatment. Past Medical History:  Diagnosis Date  . 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 Allergic rhinitis   . Allergy   . Anxiety disorder   . Asthma   . Chronic bronchitis   . Colon cancer ([place])    adenocarcinoma in a polyps 11-24-2014  . COPD (chronic obstructive pulmonary disease) ([place])   . Cough   . Dizziness and giddiness   . Dyspepsia   . Emphysema of lung ([place])   . External hemorrhoids   . EXTERNAL HEMORRHOIDS 09/27/2009  . Female stress incontinence   .  GERD (gastroesophageal reflux disease)    occ depending on diet   . Goiter   . History of lung cancer   . Hypercholesteremia    denies-last check normal labs  . 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 Insomnia   . Lung cancer ([place])   . Migraine, unspecified, without mention of intractable migraine without mention of status migrainosus   . Panic attacks   . Seizures ([place])    very long ago like 15 years ago or so , questionable etilogy  . Skin cancer (melanoma) ([place])    right calf    Past Surgical History:  Procedure Laterality Date  . biopsy of right neck mass     negative-thyroid biopsy   . BREAST EXCISIONAL BIOPSY Right 1987   no visible scar  . BRONCHOSCOPY  01/31/2005   nonspecific inflammation  . BRONCHOSCOPY  08/24/2010   nonspecific inflammation  . COLONOSCOPY    . LUNG REMOVAL, PARTIAL     for lung cancer--followed by Dr. Arlyce Dice and Dr. Annamaria Boots  . MELANOMA EXCISION WITH SENTINEL LYMPH NODE BIOPSY Right 11/29/2014   Procedure: RIGHT INGUINAL SENTINEL LYMPH NODE BIOPSY;  Surgeon: Georganna Skeans, MD;  Location: [facility];  Service: General;  Laterality: Right;  . melanoma removal     right calf   . POLYPECTOMY    . portacath removed  10/2007  . RIGHT/LEFT HEART CATH AND CORONARY ANGIOGRAPHY N/A 05/06/2017   Procedure: RIGHT/LEFT HEART CATH AND CORONARY ANGIOGRAPHY;  Surgeon: [name], MD;  Location: [facility];  Service: Cardiovascular;  Laterality: N/A;  . SPIROMETRY  07/03/2002   min obstruction,mild restriction 01/31/2005  . tvh  12/01/2000   hysterectomy    Family History  Problem Relation Age of Onset  . Lung cancer Father        smoker and worked at a cigarett factory  . COPD Mother        not a smoker  . Other Mother        hx of hysterectomy for unspecified reason  . Asthma Daughter   . Diabetes Maternal Grandfather   . Colon polyps Brother        approx 16 polyps on his first colonoscopy  . Other Maternal Aunt         non-cancerous growth in lungs; respiratory issues; smoker  . COPD Maternal Grandmother        d. 80  . Cancer Paternal Grandfather        oral/mouth cancer; chewed tobacco; d. older age  . Throat cancer Other        maternal great aunt (MGF's sister); not a smoker  . Cirrhosis Paternal Uncle        hx of alcohol abuse   . Colon cancer Neg Hx   . Rectal cancer Neg Hx   . Stomach cancer Neg Hx     Social History:  reports that she quit smoking about 15 years ago. Her smoking use included Cigarettes. She started smoking about 38 years ago. She has a 46.00 pack-year smoking history. She has never used smokeless tobacco. She reports that she does not drink alcohol or use drugs.  Allergies: No Known Allergies  Medications: I have reviewed the patient's current medications.  Results for orders placed or performed during the hospital encounter of 04/30/17 (from the past 48 hour(s))  Heparin level (unfractionated)     Status: Abnormal   Collection Time: 05/09/17 12:48 PM  Result Value Ref Range   Heparin Unfractionated >2.20 (H) 0.30 - 0.70 IU/mL    Comment: RESULTS CONFIRMED BY MANUAL DILUTION        IF HEPARIN RESULTS ARE BELOW EXPECTED VALUES, AND PATIENT DOSAGE HAS BEEN CONFIRMED, SUGGEST FOLLOW UP TESTING OF ANTITHROMBIN III LEVELS.   Heparin level (unfractionated)     Status: None   Collection Time: 05/09/17  3:21 PM  Result Value Ref Range   Heparin Unfractionated 0.48 0.30 - 0.70 IU/mL    Comment:        IF HEPARIN RESULTS ARE BELOW EXPECTED VALUES, AND PATIENT DOSAGE HAS BEEN CONFIRMED, SUGGEST FOLLOW UP TESTING OF ANTITHROMBIN III LEVELS.   CBC     Status: None   Collection Time: 05/10/17  3:08 AM  Result Value Ref Range   WBC 5.8 4.0 - 10.5 K/uL   RBC 4.33 3.87 - 5.11 MIL/uL   Hemoglobin 12.4 12.0 - 15.0 g/dL   HCT 37.0 36.0 - 46.0 %   MCV 85.5 78.0 - 100.0 fL   MCH 28.6 26.0 - 34.0 pg   MCHC 33.5 30.0 - 36.0 g/dL   RDW 13.2 11.5 - 15.5 %   Platelets 169 150 -  400 K/uL  Heparin level (unfractionated)     Status: None   Collection Time: 05/10/17  3:08 AM  Result Value Ref Range   Heparin Unfractionated 0.42 0.30 - 0.70 IU/mL    Comment:        IF HEPARIN RESULTS ARE BELOW EXPECTED VALUES, AND PATIENT DOSAGE HAS BEEN CONFIRMED, SUGGEST FOLLOW UP TESTING OF ANTITHROMBIN III LEVELS.     Basic metabolic panel     Status: None   Collection Time: 05/10/17  3:08 AM  Result Value Ref Range   Sodium 137 135 - 145 mmol/L   Potassium 4.0 3.5 - 5.1 mmol/L   Chloride 105 101 - 111 mmol/L   CO2 25 22 - 32 mmol/L   Glucose, Bld 92 65 - 99 mg/dL   BUN 8 6 - 20 mg/dL   Creatinine, Ser 0.79 0.44 - 1.00 mg/dL   Calcium 9.1 8.9 - 10.3 mg/dL   GFR calc non Af Amer >60 >60 mL/min   GFR calc Af Amer >60 >60 mL/min    Comment: (NOTE) The eGFR has been calculated using the CKD EPI equation. This calculation has not been validated in all clinical situations. eGFR's persistently <60 mL/min signify possible Chronic Kidney Disease.    Anion gap 7 5 - 15  CBC     Status: None   Collection Time: 05/11/17  5:57 AM  Result Value Ref Range   WBC 5.7 4.0 - 10.5 K/uL   RBC 4.69 3.87 - 5.11 MIL/uL   Hemoglobin 13.3 12.0 - 15.0 g/dL   HCT 40.0 36.0 - 46.0 %   MCV 85.3 78.0 - 100.0 fL   MCH 28.4 26.0 - 34.0 pg   MCHC 33.3 30.0 - 36.0 g/dL   RDW 13.3 11.5 - 15.5 %   Platelets 190 150 - 400 K/uL  Heparin level (unfractionated)     Status: None   Collection Time: 05/11/17  5:57 AM  Result Value Ref Range   Heparin Unfractionated 0.36 0.30 - 0.70 IU/mL    Comment:        IF HEPARIN RESULTS ARE BELOW EXPECTED VALUES, AND PATIENT DOSAGE HAS BEEN CONFIRMED, SUGGEST FOLLOW UP TESTING OF ANTITHROMBIN III LEVELS.   Basic metabolic panel     Status: Abnormal   Collection Time: 05/11/17  5:57 AM  Result Value Ref Range   Sodium 136 135 - 145 mmol/L   Potassium 3.8 3.5 - 5.1 mmol/L   Chloride 103 101 - 111 mmol/L   CO2 23 22 - 32 mmol/L   Glucose, Bld 123 (H) 65 -  99 mg/dL   BUN 6 6 - 20 mg/dL   Creatinine, Ser 0.73 0.44 - 1.00 mg/dL   Calcium 9.2 8.9 - 10.3 mg/dL   GFR calc non Af Amer >60 >60 mL/min   GFR calc Af Amer >60 >60 mL/min    Comment: (NOTE) The eGFR has been calculated using the CKD EPI equation. This calculation has not been validated in all clinical situations. eGFR's persistently <60 mL/min signify possible Chronic Kidney Disease.    Anion gap 10 5 - 15    No results found.  Review of Systems  Constitutional: Negative for chills and fever.  HENT: Negative for hearing loss.   Respiratory: Positive for shortness of breath.   Cardiovascular: Positive for leg swelling. Negative for chest pain.  Gastrointestinal: Negative for abdominal pain, nausea and vomiting.  Genitourinary: Negative for dysuria.  Neurological: Positive for dizziness and weakness.   Blood pressure 94/75, pulse 98, temperature 98 F (36.7 C), temperature source Oral, resp. rate 18, height _0  (1.651 m), weight 68.9 kg (152 lb), SpO2 98 %. Physical Exam  HENT:  Head: Normocephalic and atraumatic.  Eyes: Pupils are equal, round, and reactive to light. Conjunctivae are normal. Left eye exhibits no discharge. No scleral icterus.  Neck: Normal range of motion. Neck supple. No JVD present. No tracheal deviation present.  No thyromegaly present.  Cardiovascular: Normal rate and regular rhythm.   Murmur (Soft systolic and diastolic murmur noted) heard. Respiratory: Effort normal and breath sounds normal. No respiratory distress. She has no wheezes. She has no rales.  GI: Soft. Bowel sounds are normal. She exhibits no distension. There is no tenderness. There is no rebound.  Musculoskeletal:  No clubbing cyanosis trace edema noted. Right groin dressing dry and mild ecchymosis noted. 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 death MI stroke  local vascular complications risk of restenosis etc. and consents for PCI.   Charolette Forward 05/11/2017, 11:25 AM

## 2017-05-12 NOTE — Interval H&P Note (Signed)
Cath Lab Visit (complete for each Cath Lab visit)  Clinical Evaluation Leading to the Procedure:   ACS: Yes.    Non-ACS:    Anginal Classification: CCS III  Anti-ischemic medical therapy: Minimal Therapy (1 class of medications)  Non-Invasive Test Results: No non-invasive testing performed  Prior CABG: No previous CABG      History and Physical Interval Note:  05/12/2017 10:27 AM  [name]  has presented today for surgery, with the diagnosis of CAD  The various methods of treatment have been discussed with the patient and family. After consideration of risks, benefits and other options for treatment, the patient has consented to  Procedure(s): CORONARY STENT INTERVENTION (N/A) as a surgical intervention .  The patient's history has been reviewed, patient examined, no change in status, stable for surgery.  I have reviewed the patient's chart and labs.  Questions were answered to the patient's satisfaction.     Charolette Forward

## 2017-05-13 ENCOUNTER — Other Ambulatory Visit: Payer: Self-pay | Admitting: Family Medicine

## 2017-05-13 DIAGNOSIS — Z955 Presence of coronary angioplasty implant and graft: Secondary | ICD-10-CM

## 2017-05-13 LAB — BASIC METABOLIC PANEL
ANION GAP: 5 (ref 5–15)
BUN: 12 mg/dL (ref 6–20)
CHLORIDE: 104 mmol/L (ref 101–111)
CO2: 26 mmol/L (ref 22–32)
Calcium: 8.9 mg/dL (ref 8.9–10.3)
Creatinine, Ser: 0.66 mg/dL (ref 0.44–1.00)
GFR calc non Af Amer: >60 mL/min (ref >60)
Glucose, Bld: 109 mg/dL — ABNORMAL HIGH (ref 65–99)
POTASSIUM: 3.8 mmol/L (ref 3.5–5.1)
SODIUM: 135 mmol/L (ref 135–145)

## 2017-05-13 LAB — CBC
HEMATOCRIT: 35.3 % — AB (ref 36.0–46.0)
HEMOGLOBIN: 12 g/dL (ref 12.0–15.0)
MCH: 29 pg (ref 26.0–34.0)
MCHC: 34 g/dL (ref 30.0–36.0)
MCV: 85.3 fL (ref 78.0–100.0)
Platelets: 181 10*3/uL (ref 150–400)
RBC: 4.14 MIL/uL (ref 3.87–5.11)
RDW: 13.4 % (ref 11.5–15.5)
WBC: 6.7 10*3/uL (ref 4.0–10.5)

## 2017-05-13 MED ORDER — ATORVASTATIN CALCIUM 40 MG PO TABS: 40.0000 mg | ORAL_TABLET | Freq: Every day | ORAL | 0 refills | Status: DC

## 2017-05-13 MED ORDER — DIGOXIN 125 MCG PO TABS: 0.1250 mg | ORAL_TABLET | Freq: Every day | ORAL | 0 refills | Status: DC

## 2017-05-13 MED ORDER — CLOPIDOGREL BISULFATE 75 MG PO TABS: 75.0000 mg | ORAL_TABLET | Freq: Every day | ORAL | 0 refills | Status: DC

## 2017-05-13 MED ORDER — ASPIRIN 81 MG PO TBEC: 81.0000 mg | DELAYED_RELEASE_TABLET | Freq: Every day | ORAL | 0 refills | Status: DC

## 2017-05-13 MED ORDER — LOSARTAN POTASSIUM 25 MG PO TABS: 25.0000 mg | ORAL_TABLET | Freq: Every day | ORAL | 0 refills | Status: DC

## 2017-05-13 MED ORDER — IPRATROPIUM-ALBUTEROL 0.5-2.5 (3) MG/3ML IN SOLN: 3.0000 mL | Freq: Four times a day (QID) | RESPIRATORY_TRACT | 0 refills | Status: DC | PRN

## 2017-05-13 MED ORDER — CARVEDILOL 3.125 MG PO TABS: 3.1250 mg | ORAL_TABLET | Freq: Two times a day (BID) | ORAL | 0 refills | Status: DC

## 2017-05-13 MED ORDER — NITROGLYCERIN 0.4 MG SL SUBL: 0.4000 mg | SUBLINGUAL_TABLET | SUBLINGUAL | 12 refills | Status: DC | PRN

## 2017-05-13 MED ORDER — SPIRONOLACTONE 25 MG PO TABS: 12.5000 mg | ORAL_TABLET | Freq: Every day | ORAL | 0 refills | Status: DC

## 2017-05-13 MED ORDER — FUROSEMIDE 40 MG PO TABS
40.0000 mg | ORAL_TABLET | Freq: Every day | ORAL | Status: DC
Start: 2017-05-13 — End: 2017-05-13
  Administered 2017-05-13: 40 mg via ORAL
  Filled 2017-05-13: qty 1

## 2017-05-13 MED ORDER — FUROSEMIDE 40 MG PO TABS: 40.0000 mg | ORAL_TABLET | Freq: Every day | ORAL | 0 refills | Status: DC

## 2017-05-13 NOTE — Progress Notes (Signed)
Subjective:  Doing well denies any chest pain or shortness of breath tolerated PCI to unprotected left main yesterday  Objective:  Vital Signs in the last 24 hours: Temp:  [97.6 F (36.4 C)-98.5 F (36.9 C)] 97.6 F (36.4 C) (09/11 0600) Pulse Rate:  [0-106] 88 (09/11 0600) Resp:  [8-27] 17 (09/11 0600) BP: (85-136)/(55-109) 100/59 (09/11 0600) SpO2:  [0 %-100 %] 98 % (09/11 0600) Weight:  [67.9 kg (149 lb 11.1 oz)] 67.9 kg (149 lb 11.1 oz) (09/11 0600)  Intake/Output from previous day: 09/10 0701 - 09/11 0700 In: 1347.9 [P.O.:1080; I.V.:250] Out: 600 [Urine:600] Intake/Output from this shift: No intake/output data recorded.  Physical Exam: Neck: no adenopathy, no carotid bruit, no JVD and supple, symmetrical, trachea midline Lungs: clear to auscultation anteriorly Heart: regular rate and rhythm, S1, S2 normal and soft systolic murmur noted Abdomen: soft, non-tender; bowel sounds normal; no masses,  no organomegaly Extremities: extremities normal, atraumatic, no cyanosis or edema and both groins stable no evidence of hematoma or bruit minimal ecchymosis right groin  Lab Results:  Recent Labs  05/12/17 0433 05/13/17 0143  WBC 5.3 6.7  HGB 12.8 12.0  PLT 180 181    Recent Labs  05/12/17 0433 05/13/17 0143  NA 138 135  K 4.2 3.8  CL 104 104  CO2 28 26  GLUCOSE 95 109*  BUN 11 12  CREATININE 0.75 0.66   No results for input(s): TROPONINI in the last 72 hours.  Invalid input(s): CK, MB Hepatic Function Panel No results for input(s): PROT, ALBUMIN, AST, ALT, ALKPHOS, BILITOT, BILIDIR, IBILI in the last 72 hours. No results for input(s): CHOL in the last 72 hours. No results for input(s): PROTIME in the last 72 hours.  Imaging: Imaging results have been reviewed and No results found.  Cardiac Studies:  Assessment/Plan:  Decompensated acute on chronic systolic congestive heart failure Exertional dyspnea with abnormal EKG probably angina equalant CAD with  critical ostial left main stenosis status post PCI to ostial and proximal left main using Impella left ventricular assist device support.doing well Severe COPD History of non-cell CA of right lung status post lobectomy in 2003 status post chemotherapy and radiation in the past History of adenocarcinoma of colon History of melanoma of the skin in the past Remote tobacco abuse Status post nonsustained VT Plan Continue present management I will sign off please call if needed  Dr. Doylene Canard to follow  LOS: 13 days    [name] 05/13/2017, 7:29 AM

## 2017-05-13 NOTE — Progress Notes (Signed)
CARDIAC REHAB PHASE I   PRE:  Rate/Rhythm: 102 ST    BP: sitting 99/65    SaO2: 93 RA  MODE:  Ambulation: 1000 ft   POST:  Rate/Rhythm: 122 ST    BP: sitting 117/85     SaO2: 92 RA  Tolerated much better. Sts she is breathing better with activity, did not need to rest. HR elevated, SaO2 stable. Pt has O2 at home (she apparently needed O2 last night).  Ed completed including Plavix, stent, restrictions, HF management (daily wts and low sodium), diet, ex, NTG and CRPII. Will send referral to [place]. Pt receptive and able to perform teach back. [place], ACSM 05/13/2017 9:25 AM

## 2017-05-13 NOTE — Discharge Instructions (Signed)
You were admitted for shortness of breath and low oxygen from your pulmonology office.  While hospitalized, it was found that you are in heart failure, meaning your heart does not pump as well as it should.  It was also found that you had a blockage in one of the arteries that supplies your heart with blood. Cardiology was consulted and placed a stent in your blocked artery to open it up.  You were started on 5 new medications this hospitalization (Losartan, Spironolactone, Clopidogrel, Digoxin, Carvediolol).  Be sure to take them every day as prescribed.    You should take your weight every day.  If your weight increases by more than 2 pounds or your extremities start to swell, you should take 1 pill of Furosemide (Lasix) (40mg  total) to decrease swelling.  [facility] up with the [facility] Medicine clinic with Dr. McDiarmid on 05/15/17 at 10:30am. Please arrive 15 minutes prior to your appointment time. Follow up with Cardiology with Dr. Doylene Canard in 1 week.

## 2017-05-13 NOTE — Progress Notes (Signed)
Family Medicine Teaching Service Daily Progress Note Intern Pager: [phone]  Patient name: [name] Medical record number: [id number] Date of birth: 1966-03-30 Age: 51 y.o. Gender: female  Primary Care Provider: Rory Percy, DO Consultants: cardiology  Code Status: FULL  Assessment and Plan:  New onset CHF, improved: Shortness of breath has improved with diuresis. Weight stable ~150-152 lbs. Net negative 13 L since admission; off lasix since 8/29. New onset CHF with EF 20-25% and diffuse hypokinesis. BPs continue to remain low throughout this admission (SBP mid 80s-low 100s), continue to monitor.  - cardiac monitoring - Incentive spirometry - cardiac rehab - Albuterolq6 prn for breakthrough SOB, wheezing - hold lasix, consider restarting if respiratory status worsens - atorvastatin 40mg  - continue coreg 3.125mg , digoxin 0.125mg , spironolactone 12.5mg  per cards - daily CBC, BMP - wnl, K 3.8  New onset LM CAD s/p stenting 9/10:  Patient received L/RHC 9/4, with significant LM disease 60% stenosed and proximal RCA 15% stenosed. Not felt to be a candidate for CABG given her severe COPD seen on PFTs. S/p LM angioplasty with stenting by Dr. Terrence Dupont 9/10. Nitro drip held yesterday following stenting due to low blood pressures. Cardiology holding Cozaar for SBP <100. Loaded with Plavix 9/7, to continue 75 mg daily thereafter. EKG this morning unchanged from prior. Received oxycodone yesterday for pain at insertion site that has resolved, no chest pain or shortness of breath on exam today. Will ambulate today and if well tolerated can go home per cardio recs. - cardiac monitoring - atorvastatin 40mg  - continue coreg 3.125mg , digoxin 0.125mg , spironolactone 12.5mg  per cards - dispo: home today pending ambulation  COPD:  Stable. Saturation 95-100% on nasal cannula yesterday Denies any shortness of breath or complaints today. - Continue Anoro Ellipta - Albuterolq6 prn for  breakthrough SOB, wheezing  H/o Non-small cell cancer of right lung: Thoracotomy 1/3 right lobe, chemo 5 years, radiation for 1 year, dx 2003 - Followed closely by Dr. Baird Lyons, Pulmonology  Headaches: Chronic, stable. No complaints today. - Tylenol 650mg  prn for pain  Insomnia: Uses ambien 2-3 times per week, usually takes xanax twice per day - Continue home ambien as required for sleep  GAD. Chronic. Takes Xanax 1mg  two times a day prn.  -cont home xanax -on outpatient follow up, may need additional controlled anxiety medication  FEN/GI: Regular diet Prophylaxis: Plavix, ASA  Disposition: Anticipate discharge home today s/p stent 9/10  Subjective:  Doing well this morning. Denies any chest pain or shortness of breath. Pain experienced yesterday at insertion site after stenting much improved this morning. Excited about prospect of going home today.   Objective: Temp:  [97.6 F (36.4 C)-98.5 F (36.9 C)] 97.6 F (36.4 C) (09/11 0600) Pulse Rate:  [0-106] 88 (09/11 0600) Resp:  [8-27] 17 (09/11 0600) BP: (85-136)/(55-109) 100/59 (09/11 0600) SpO2:  [0 %-100 %] 98 % (09/11 0600) Weight:  [149 lb 11.1 oz (67.9 kg)] 149 lb 11.1 oz (67.9 kg) (09/11 0600)  Physical Exam: General: well developed, well nourished female, in no acute distress Cardiovascular: RRR, no m/r/g Respiratory: CTAB, no wheezes/rales Abdomen: soft, non-tender, BS+ Extremities: warm, dry, trace LE edema, dorsal pedal pulses intact. Slight bruising to R groin, decreased from yesterday.  Laboratory:  Recent Labs Lab 05/11/17 0557 05/12/17 0433 05/13/17 0143  WBC 5.7 5.3 6.7  HGB 13.3 12.8 12.0  HCT 40.0 37.9 35.3*  PLT 190 180 181    Recent Labs Lab 05/11/17 0557 05/12/17 0433 05/13/17 0143  NA 136  138 135  K 3.8 4.2 3.8  CL 103 104 104  CO2 23 28 26   BUN 6 11 12   CREATININE 0.73 0.75 0.66  CALCIUM 9.2 9.1 8.9  GLUCOSE 123* 95 109*    Rory Percy, DO  PGY-1 05/13/2017, 8:45 AM FPTS Intern pager: [phone], text pages welcome

## 2017-05-14 MED FILL — Nitroglycerin IV Soln 100 MCG/ML in D5W: INTRA_ARTERIAL | Qty: 10 | Status: AC

## 2017-05-14 MED FILL — Lidocaine HCl Local Inj 2%: INTRAMUSCULAR | Qty: 40 | Status: AC

## 2017-05-15 ENCOUNTER — Ambulatory Visit (INDEPENDENT_AMBULATORY_CARE_PROVIDER_SITE_OTHER): Payer: Medicare Other | Admitting: Family Medicine

## 2017-05-15 ENCOUNTER — Encounter: Payer: Self-pay | Admitting: Family Medicine

## 2017-05-15 VITALS — BP 95/62 | HR 103 | Ht 65.0 in | Wt 151.6 lb

## 2017-05-15 DIAGNOSIS — J449 Chronic obstructive pulmonary disease, unspecified: Secondary | ICD-10-CM | POA: Diagnosis not present

## 2017-05-15 DIAGNOSIS — J4541 Moderate persistent asthma with (acute) exacerbation: Secondary | ICD-10-CM

## 2017-05-15 DIAGNOSIS — I9589 Other hypotension: Secondary | ICD-10-CM | POA: Diagnosis not present

## 2017-05-15 DIAGNOSIS — I351 Nonrheumatic aortic (valve) insufficiency: Secondary | ICD-10-CM

## 2017-05-15 DIAGNOSIS — Z79899 Other long term (current) drug therapy: Secondary | ICD-10-CM

## 2017-05-15 DIAGNOSIS — I5021 Acute systolic (congestive) heart failure: Secondary | ICD-10-CM | POA: Diagnosis not present

## 2017-05-15 DIAGNOSIS — I2510 Atherosclerotic heart disease of native coronary artery without angina pectoris: Secondary | ICD-10-CM

## 2017-05-15 DIAGNOSIS — F411 Generalized anxiety disorder: Secondary | ICD-10-CM

## 2017-05-15 DIAGNOSIS — F410 Panic disorder [episodic paroxysmal anxiety] without agoraphobia: Secondary | ICD-10-CM

## 2017-05-15 DIAGNOSIS — I5022 Chronic systolic (congestive) heart failure: Secondary | ICD-10-CM

## 2017-05-15 HISTORY — DX: Nonrheumatic aortic (valve) insufficiency: I35.1

## 2017-05-15 MED ORDER — CARVEDILOL 3.125 MG PO TABS: 3.1250 mg | ORAL_TABLET | Freq: Two times a day (BID) | ORAL | 0 refills | Status: DC

## 2017-05-15 MED ORDER — DIGOXIN 125 MCG PO TABS: 0.1250 mg | ORAL_TABLET | Freq: Every day | ORAL | 3 refills | Status: DC

## 2017-05-15 MED ORDER — ALPRAZOLAM 1 MG PO TABS: 1.0000 mg | ORAL_TABLET | Freq: Two times a day (BID) | ORAL | 1 refills | Status: DC | PRN

## 2017-05-15 MED ORDER — CLOPIDOGREL BISULFATE 75 MG PO TABS: 75.0000 mg | ORAL_TABLET | Freq: Every day | ORAL | 3 refills | Status: DC

## 2017-05-15 MED ORDER — FUROSEMIDE 40 MG PO TABS: 40.0000 mg | ORAL_TABLET | Freq: Every day | ORAL | 1 refills | Status: DC

## 2017-05-15 MED ORDER — ATORVASTATIN CALCIUM 40 MG PO TABS: 40.0000 mg | ORAL_TABLET | Freq: Every day | ORAL | 3 refills | Status: DC

## 2017-05-15 MED ORDER — ALBUTEROL SULFATE 108 (90 BASE) MCG/ACT IN AEPB: 2.0000 | INHALATION_SPRAY | Freq: Four times a day (QID) | RESPIRATORY_TRACT | 4 refills | Status: DC | PRN

## 2017-05-15 MED ORDER — UMECLIDINIUM-VILANTEROL 62.5-25 MCG/INH IN AEPB: 1.0000 | INHALATION_SPRAY | Freq: Every day | RESPIRATORY_TRACT | 0 refills | Status: DC

## 2017-05-15 MED ORDER — IPRATROPIUM-ALBUTEROL 0.5-2.5 (3) MG/3ML IN SOLN: 3.0000 mL | Freq: Four times a day (QID) | RESPIRATORY_TRACT | 1 refills | Status: DC | PRN

## 2017-05-15 MED ORDER — ZOLPIDEM TARTRATE 5 MG PO TABS: ORAL_TABLET | ORAL | 5 refills | Status: DC

## 2017-05-15 MED ORDER — LOSARTAN POTASSIUM 25 MG PO TABS: 25.0000 mg | ORAL_TABLET | Freq: Every day | ORAL | 0 refills | Status: DC

## 2017-05-15 MED ORDER — SPIRONOLACTONE 25 MG PO TABS: 12.5000 mg | ORAL_TABLET | Freq: Every day | ORAL | 0 refills | Status: DC

## 2017-05-15 NOTE — Assessment & Plan Note (Signed)
Stable. Tolerating new medications which may lower BP. No change in regiment at this time.

## 2017-05-15 NOTE — Assessment & Plan Note (Signed)
Established problem. Stable. Continue current therapy of Anoro Ellipta , Duoneb prn and ProAir respiclick prn.  Follow up with Dr Annamaria Boots (Pulm) 06/17/17 outpatient.

## 2017-05-15 NOTE — Assessment & Plan Note (Signed)
Established problem. Stable. Continue current therapies with DAPT and atorvastatin  Need to ensure outpt follow up with Dr Doylene Canard (Card) soon.

## 2017-05-15 NOTE — Patient Instructions (Signed)
Dr Letcher Schweikert will see if we can get you a case manager to help you navigate the healthcare system, including all your specialist appointments.   Make sure you get the clopidogrel and aspirin in every day.  They are keeping your heart stent open.

## 2017-05-15 NOTE — Addendum Note (Signed)
Addended byWendy Poet, Latoshia Monrroy D on: 05/15/2017 01:37 PM   Modules accepted: Orders

## 2017-05-15 NOTE — Progress Notes (Signed)
Follow up outpatient visit after Hospitalization  [name] is alone Sources of clinical information for visit is/are patient, past medical records and Discharge Summary for hospitiatlizations between 04/30/17 to 05/13/17. . The Discharge Summary for the hospitalization from 04/30/17 to 05/13/17 was reviewed.  Nursing assessment for this office visit was reviewed with the patient for accuracy and revision.  Depression screen [facility] 2/9 05/15/2017 06/12/2016 03/28/2016 06/07/2015 12/01/2014  Decreased Interest 0 0 0 0 0  Down, Depressed, Hopeless 0 0 1 0 0  PHQ - 2 Score 0 0 1 0 0  Altered sleeping - 0 - - -  Tired, decreased energy - 0 - - -  Change in appetite - 0 - - -  Feeling bad or failure about yourself  - 0 - - -  Trouble concentrating - 0 - - -  Moving slowly or fidgety/restless - 0 - - -  Suicidal thoughts - 0 - - -  PHQ-9 Score - 0 - - -  Some recent data might be hidden    HPI Principle Diagnosis requiring hospitalization: Acute Systolic Heart Failure.   Brief Hospital course summary: Admission to [facility] from Dr Tarri Fuller Young''s office for acute progressive shortness of breath.  Diagnosis with cardiogenic pulmonary edema secondary to acute decompensated systolic heart failure.  TTE showed EF 20-25%.   Right and left cardiac cath by Dr Doylene Canard on 05/06/17 showed left main culprit lesion and non-obstructive RCA disease.  There was diffuse hypokinesis/akinesis of left ventricle. Mean PAP 30 mmHg.   The Patient improved with BIPAP and diuresis.  Patient started on multiple cardiac medications for heart failure, atherosclerosis secondary prevention, and antiplatelet therapies.   Follow up instructions from patient's hospital healthcare providers:  1. Ensure continued resolution of symptoms. 2. Ensure toleration and compliance of new medications: Plavix, Spironolactone, Coreg, Cozaar. 3. Patient can take Lasix 40mg  MWF as needed for fluid overload (LE edema, weight gain>2lbs) per  Cardiology. Instructed to take daily weights. 4. Ensure adequate follow up with Cardiology and Pulmonology. ---------------------------------------------------------------------------------------------------------------------- Problems since hospital discharge: None.   ---------------------------------------------------------------------------------------------------------------------- Follow up appointments with specialists:   Pending: Follow up with Dr Doylene Canard - patient apparently expected to make this appointment themselves for as soon as possible after discharge.  Patient did not have a good understanding about this recommendation Completed: no appointment scheduled per patient  Pending: Pulmonary with Dr Baird Lyons on 06/17/18 scheduled  Pending: Medical Oncology 10/11 scheduled  Cardiac Rehab prescribed - do not see in appointments  ---------------------------------------------------------------------------------------------------------------------- New medications started during hospitalization: Losartan, Digoxin, Coreg, Sprinolactone, Clopidogrel, furosemide, atorvastatin, Duoneb, Umedlidinium-vilantrol Chronic medications stopped during hospitalization: Breo Patient's Medication List was updated in the EMR: yes --------------------------------------------------------------------------------------------------------------------- Home Health Services: None arranged Durable Medical Equipment: None --------------------------------------------------------------------------------------------------------------------- ADLs Independent Needs Assistance Dependent  Bathing x    Dressing x    Ambulation x    Toileting x    Eating x     IADL Independent Needs Assistance Dependent  Cooking x    Housework x    Manage Medications x    Manage the telephone x    Shopping for food, clothes, Meds, etc x    Use transportation x    Manage Finances x     Systolic Heart Failure,  Ischemic 51 YO female who presents for follow-up of congestive heart failure admission. Current symptoms include: none. She denies dyspnea, near-syncope, orthopnea, syncope and tachypnea. She states she is compliant all of the time with her medications. She states she is  compliant all of the time with her diet.  COPD, Severe - Longstanding issue. Managed by Dr Garrel Ridgel ([place]) - PFT inhouse (05/08/17) c/w severe obstructive airway disease with slight bronchodilator responsiveness.  - Pt chenged to [place].   - Pt has used Duoneb once since discharge to help clear phlegm.  It was helpful  No use of Proair respiclick sicne DC.  - No cough, no persistent sputum, able to lie flat in bed when sleeping.  No interruption of sleep with breathlessness.   Ischemic heart Disease - Newly diagnosed during hospitalization for ADHF in ischemic  systolic ventricular dysfunction.  - No chest pain, no use of NTGL - taking her Plavix and Aspirin - No epistaxis, no blood in BM, no gum bleeding.   The following portions of the patient's history were reviewed and updated as appropriate:  Medications, PMH Denies return to smoking. .  Review of Systems Constitutional: negative for fevers and weight gain Respiratory: negative for cough, sputum and wheezing Cardiovascular: negative for chest pain, fatigue and paroxysmal nocturnal dyspnea   Phsycial Exam Vitals:   05/15/17 1047  BP: 95/62  Pulse: (!) 103  SpO2: 92%  Weight: 151 lb 9.6 oz (68.8 kg)   VS reviewed GEN: Alert, Cooperative, Groomed, NAD, clothes with odor of tobacco smoke.  COR: heart rate 100 regular, No M/G/R, No JVD LUNGS: BCTA, No Acc mm use, speaking in full sentences EXT: No peripheral leg edema. Palpable bilateral pedal pulses.  Gait: Normal speed, No significant path deviation, Step through +,  Psych: Normal affect/thought/speech/language

## 2017-05-15 NOTE — Assessment & Plan Note (Signed)
Stable. Longstanding diagnosis on alprazolam.  Attempts to change to long-acting benzo and to SSRI have been unsuccessful.  Refilled Alprazolam for 30 day supply with one refill.   Pt understands she will need to meet with her [facility] PCP before further authorization for refill of this CS Cat IV.

## 2017-05-15 NOTE — Assessment & Plan Note (Signed)
See Generalized Anxiety Disorder. Stable.

## 2017-05-16 LAB — BASIC METABOLIC PANEL
BUN/Creatinine Ratio: 17 (ref 9–23)
BUN: 14 mg/dL (ref 6–24)
CO2: 23 mmol/L (ref 20–29)
Calcium: 9.6 mg/dL (ref 8.7–10.2)
Chloride: 98 mmol/L (ref 96–106)
Creatinine, Ser: 0.83 mg/dL (ref 0.57–1.00)
GFR calc Af Amer: 94 mL/min/{1.73_m2} (ref >59)
GFR, EST NON AFRICAN AMERICAN: 81 mL/min/{1.73_m2} (ref >59)
Glucose: 74 mg/dL (ref 65–99)
POTASSIUM: 4.4 mmol/L (ref 3.5–5.2)
SODIUM: 138 mmol/L (ref 134–144)

## 2017-05-16 LAB — DIGOXIN, RANDOM, SERUM: Digoxin, Random, Serum: 1.1 ng/mL

## 2017-05-20 ENCOUNTER — Telehealth (HOSPITAL_COMMUNITY): Payer: Self-pay

## 2017-05-20 NOTE — Telephone Encounter (Signed)
Patient insurance is active and benefits verified. Patient insurance is [facility] Medicare  - no co-payment, no deductible, out of pocket $6700/$63.23 has been met, no co-insurance and no pre-authorization. Passport/reference [phone].

## 2017-06-03 NOTE — Progress Notes (Signed)
Subjective:   Patient ID: [name]    DOB: 01-May-1966, 51 y.o. female   MRN: [id number]  [name] is a 51 y.o. female with a history of CAD, CHF, severe COPD, h/o colon and lung cancer here for   CHF EF 20-25% - medications: Spironolactone 12.5mg , Coreg 3.125mg , Cozaar 25mg , Lasix 40mg  MWF PRN - Compliance: yes - daily weights: yes - Denies dyspnea, near-syncope, orthopnea, syncope, tachypnea - outpatient follow up with Dr. Kirk Ruths 10/15  CAD s/p Left main stent - Medications: Plavix 75mg , ASA 81, nitroglycerin PRN, atorvastatin 40mg  - Compliance: yes - Denies excessive bruising or bleeding  COPD, severe Follows with Dr. Annamaria Boots, next appt 10/16 - Medications: Anoro, Duoneb PRN, ProAir respiclick PRN - hasnt had to use proair - not using anoro, wants to switch back to breo, doesn't taste good. - has O2 at home but only when needed for SOB and at night when sleeping  GAD - Medications: Alprazolam 1mg  - takes 1 whole pill every day usually. Sometimes takes 2. Never takes more than 2 per day. - Previously saw Dr. Gwenlyn Saran, unclear why she stopped seeing - previously taken Celexa, stopped in 2016, doesn't remember any side effects or why it was stopped. - When asked what most worries her, she states "everything." - Watching TV is the only thing that relaxes her - GAD score of 17 today  GAD 7 : Generalized Anxiety Score 06/04/2017 12/16/2016 06/12/2016  Nervous, Anxious, on Edge 3 0 0  Control/stop worrying 3 3 0  Worry too much - different things 3 3 0  Trouble relaxing 2 0 3  Restless 2 0 0  Easily annoyed or irritable 3 0 0  Afraid - awful might happen 1 0 1  Total GAD 7 Score 17 6 4   Anxiety Difficulty Very difficult Not difficult at all Not difficult at all    Dr. McDiarmid placed referral for case manager at last visit to aid in navigating the healthcare system, but [facility] stated she does not meet criteria.   Review of Systems:  Per HPI.   [place]: reviewed.  Smoking status reviewed. Medications reviewed.  Objective:   BP 110/60   Pulse 94   Temp 98.1 F (36.7 C) (Oral)   Ht 5\' 5"  (1.651 m)   Wt 152 lb 6.4 oz (69.1 kg)   SpO2 95%   BMI 25.36 kg/m  Vitals and nursing note reviewed.  General: well nourished, well developed, in no acute distress with non-toxic appearance HEENT: normocephalic, atraumatic, moist mucous membranes CV: regular rate and rhythm without murmurs, rubs, or gallops, no lower extremity edema Lungs: clear to auscultation bilaterally with normal work of breathing Skin: warm, dry Extremities: warm and well perfused, normal tone MSK: ROM grossly intact, gait normal Neuro: Alert and oriented, speech normal  Assessment & Plan:   CHF (congestive heart failure) (HCC) Medications: Spironolactone, Coreg, Cozaar, Lasix PRN Good compliance with medications.  - has outpatient follow up with Dr. Aundra Dubin 06/16/17  COPD, severe ([place]) Medications: Anoro, Duoneb PRN, ProAir PRN Stable, well controlled. - Currently not using Anoro b/c it tastes bad, previously on Breo - Instructed to use Anoro and to call Dr. Janee Morn office to let them know she hasn't been taking Anoro. - has outpatient follow up on 06/17/17.  Generalized anxiety disorder GAD 17 today. Main focus of today's visit. Is very anxious when discussing medication changes. Extensive conversation today about the need to control anxiety with long acting medications as well as  other modalities for stress management and counseling with hopeful decrease in the need for Xanax. Patient states she has tried other anxiety medications in the past but were quit because they "didn't work." On chart review, she was on Celexa 20mg  daily until 2016 with no further titration. Patient amenable to starting SSRI today after discussion. She has also followed with Dr. Gwenlyn Saran in the past for anxiety management but is unsure why she stopped going. - Celexa 20mg  daily, titrate as needed - continue  current dose of Xanax, wean off as tolerated. - Instructed patient to be re-established with Dr. Gwenlyn Saran, card provided. - follow up in 2 weeks   No orders of the defined types were placed in this encounter.  Meds ordered this encounter  Medications  . DISCONTD: atorvastatin (LIPITOR) 40 MG tablet    Sig: Take 1 tablet (40 mg total) by mouth daily at 6 PM.    Dispense:  90 tablet    Refill:  3  . DISCONTD: carvedilol (COREG) 3.125 MG tablet    Sig: Take 1 tablet (3.125 mg total) by mouth 2 (two) times daily with a meal.    Dispense:  90 tablet    Refill:  0  . DISCONTD: clopidogrel (PLAVIX) 75 MG tablet    Sig: Take 1 tablet (75 mg total) by mouth daily.    Dispense:  90 tablet    Refill:  3  . DISCONTD: furosemide (LASIX) 40 MG tablet    Sig: Take 1 tablet (40 mg total) by mouth daily. Take one tablet (40mg  total) by mouth daily as needed for increased swelling or weight gain >2lb    Dispense:  90 tablet    Refill:  1  . DISCONTD: losartan (COZAAR) 25 MG tablet    Sig: Take 1 tablet (25 mg total) by mouth daily.    Dispense:  30 tablet    Refill:  0  . DISCONTD: spironolactone (ALDACTONE) 25 MG tablet    Sig: Take 0.5 tablets (12.5 mg total) by mouth daily.    Dispense:  15 tablet    Refill:  0  . DISCONTD: aspirin 81 MG EC tablet    Sig: Take 1 tablet (81 mg total) by mouth daily.    Dispense:  30 tablet    Refill:  0  . aspirin 81 MG EC tablet    Sig: Take 1 tablet (81 mg total) by mouth daily.    Dispense:  90 tablet    Refill:  1  . atorvastatin (LIPITOR) 40 MG tablet    Sig: Take 1 tablet (40 mg total) by mouth daily at 6 PM.    Dispense:  90 tablet    Refill:  1  . carvedilol (COREG) 3.125 MG tablet    Sig: Take 1 tablet (3.125 mg total) by mouth 2 (two) times daily with a meal.    Dispense:  180 tablet    Refill:  2  . clopidogrel (PLAVIX) 75 MG tablet    Sig: Take 1 tablet (75 mg total) by mouth daily.    Dispense:  90 tablet    Refill:  1  . furosemide  (LASIX) 40 MG tablet    Sig: Take 1 tablet (40 mg total) by mouth daily. Take one tablet (40mg  total) by mouth daily as needed for increased swelling or weight gain >2lb    Dispense:  90 tablet    Refill:  1  . losartan (COZAAR) 25 MG tablet    Sig: Take 1  tablet (25 mg total) by mouth daily.    Dispense:  90 tablet    Refill:  1  . spironolactone (ALDACTONE) 25 MG tablet    Sig: Take 0.5 tablets (12.5 mg total) by mouth daily.    Dispense:  45 tablet    Refill:  1  . citalopram (CELEXA) 20 MG tablet    Sig: Take 1 tablet (20 mg total) by mouth daily.    Dispense:  30 tablet    Refill:  0    Rory Percy, DO PGY-1, [facility] 06/04/2017 2:27 PM

## 2017-06-04 ENCOUNTER — Encounter: Payer: Self-pay | Admitting: Family Medicine

## 2017-06-04 ENCOUNTER — Encounter: Payer: Self-pay | Admitting: Licensed Clinical Social Worker

## 2017-06-04 ENCOUNTER — Other Ambulatory Visit: Payer: Self-pay | Admitting: Family Medicine

## 2017-06-04 ENCOUNTER — Ambulatory Visit (INDEPENDENT_AMBULATORY_CARE_PROVIDER_SITE_OTHER): Payer: Medicare Other | Admitting: Family Medicine

## 2017-06-04 VITALS — BP 110/60 | HR 94 | Temp 98.1°F | Ht 65.0 in | Wt 152.4 lb

## 2017-06-04 DIAGNOSIS — J449 Chronic obstructive pulmonary disease, unspecified: Secondary | ICD-10-CM | POA: Diagnosis not present

## 2017-06-04 DIAGNOSIS — F411 Generalized anxiety disorder: Secondary | ICD-10-CM

## 2017-06-04 DIAGNOSIS — I5021 Acute systolic (congestive) heart failure: Secondary | ICD-10-CM | POA: Diagnosis not present

## 2017-06-04 MED ORDER — ATORVASTATIN CALCIUM 40 MG PO TABS: 40.0000 mg | ORAL_TABLET | Freq: Every day | ORAL | 3 refills | Status: DC

## 2017-06-04 MED ORDER — ASPIRIN 81 MG PO TBEC: 81.0000 mg | DELAYED_RELEASE_TABLET | Freq: Every day | ORAL | 1 refills | Status: AC

## 2017-06-04 MED ORDER — CARVEDILOL 3.125 MG PO TABS: 3.1250 mg | ORAL_TABLET | Freq: Two times a day (BID) | ORAL | 2 refills | Status: DC

## 2017-06-04 MED ORDER — FUROSEMIDE 40 MG PO TABS: 40.0000 mg | ORAL_TABLET | Freq: Every day | ORAL | 1 refills | Status: DC

## 2017-06-04 MED ORDER — ATORVASTATIN CALCIUM 40 MG PO TABS: 40.0000 mg | ORAL_TABLET | Freq: Every day | ORAL | 1 refills | Status: DC

## 2017-06-04 MED ORDER — SPIRONOLACTONE 25 MG PO TABS: 12.5000 mg | ORAL_TABLET | Freq: Every day | ORAL | 1 refills | Status: DC

## 2017-06-04 MED ORDER — ASPIRIN 81 MG PO TBEC: 81.0000 mg | DELAYED_RELEASE_TABLET | Freq: Every day | ORAL | 0 refills | Status: DC

## 2017-06-04 MED ORDER — SPIRONOLACTONE 25 MG PO TABS: 12.5000 mg | ORAL_TABLET | Freq: Every day | ORAL | 0 refills | Status: DC

## 2017-06-04 MED ORDER — LOSARTAN POTASSIUM 25 MG PO TABS: 25.0000 mg | ORAL_TABLET | Freq: Every day | ORAL | 0 refills | Status: DC

## 2017-06-04 MED ORDER — CARVEDILOL 3.125 MG PO TABS: 3.1250 mg | ORAL_TABLET | Freq: Two times a day (BID) | ORAL | 0 refills | Status: DC

## 2017-06-04 MED ORDER — CLOPIDOGREL BISULFATE 75 MG PO TABS: 75.0000 mg | ORAL_TABLET | Freq: Every day | ORAL | 1 refills | Status: DC

## 2017-06-04 MED ORDER — CITALOPRAM HYDROBROMIDE 20 MG PO TABS
20.0000 mg | ORAL_TABLET | Freq: Every day | ORAL | 0 refills | Status: DC
Start: 2017-06-04 — End: 2017-06-04

## 2017-06-04 MED ORDER — LOSARTAN POTASSIUM 25 MG PO TABS: 25.0000 mg | ORAL_TABLET | Freq: Every day | ORAL | 1 refills | Status: DC

## 2017-06-04 MED ORDER — CLOPIDOGREL BISULFATE 75 MG PO TABS: 75.0000 mg | ORAL_TABLET | Freq: Every day | ORAL | 3 refills | Status: DC

## 2017-06-04 NOTE — Progress Notes (Addendum)
ESTIMATE TIME:15 minutes Type of Service: Integrated Behavioral Health warm handoff  Interpreter:No.   SUBJECTIVE: [name] is a 51 y.o. female referred by Dr. Ky Barban to assess for resources and community support only, Dr. Gwenlyn Saran will address all behavioral health needs.  Patient is talkative and engaged in conversation, she reports history or anxiety which causes her to get angry at times. Patient reports no major concerns at this time only concern is the medical bill from her recent hospital stay. She reports no difficulty with remembering to take her medication or coming to appointments.  Patient showed LCSW her pill box and shared her system of keeping track of all appointments.   LIFE CONTEXT:  [place] lives alone ,has 1 daughter and 5 grandchildren, has a car for [employer] Work: receives disability, ( has limited Medicaid)  Life changes: new medical diagnosis   INTERVENTION:  Reflective listening, Data processing manager   ASSESSMENT:Patient currently experiencing anxious of anticipating medical bills from hospital visit. Reviewed with patient [facility] financial hardship program. Based on mini assessment it appears all of patient's needs are being met at this time. Patient was referred to [facility] care management when discharged from the hospital but was denied due to not being on their member enrollment roster.    PLAN: No further intervention required at this time for LCSW  patient will contact [place]:harship program when she receives her medical bills  Warm Hand Off Completed.     Casimer Lanius, LCSW Licensed Clinical Social Worker [facility]   [phone] 2:25 PM

## 2017-06-04 NOTE — Patient Instructions (Addendum)
It was great to see you!  For your heart failure,  - Refills on medications today  For your anxiety, - I am giving you information on how to schedule an appointment with Dr. Gwenlyn Saran. - I am starting you on a medication to take every day for your anxiety, called Celexa. - We are not changing your Xanax dose today. Call when you need further refills. Your Xanax cannot be refilled before 06/14/17. - Follow up in a couple weeks to see how things are going.  Appointments: 10/11 - Thedore Mins, [place] 10/15 - Dr. Aundra Dubin, Heart Failure 10/16 - Dr. Annamaria Boots, Pulmonology 10/17 - may have appointment, should check  Take care and seek immediate care sooner if you develop any concerns.   Rory Percy, DO [facility]

## 2017-06-04 NOTE — Assessment & Plan Note (Signed)
Medications: Spironolactone, Coreg, Cozaar, Lasix PRN Good compliance with medications.  - has outpatient follow up with Dr. Aundra Dubin 06/16/17

## 2017-06-04 NOTE — Assessment & Plan Note (Signed)
Medications: Anoro, Duoneb PRN, ProAir PRN Stable, well controlled. - Currently not using Anoro b/c it tastes bad, previously on Breo - Instructed to use Anoro and to call Dr. Janee Morn office to let them know she hasn't been taking Anoro. - has outpatient follow up on 06/17/17.

## 2017-06-04 NOTE — Assessment & Plan Note (Addendum)
GAD 17 today. Main focus of today's visit. Is very anxious when discussing medication changes. Extensive conversation today about the need to control anxiety with long acting medications as well as other modalities for stress management and counseling with hopeful decrease in the need for Xanax. Patient states she has tried other anxiety medications in the past but were quit because they "didn't work." On chart review, she was on Celexa 20mg  daily until 2016 with no further titration. Patient amenable to starting SSRI today after discussion. She has also followed with Dr. Gwenlyn Saran in the past for anxiety management but is unsure why she stopped going. - Celexa 20mg  daily, titrate as needed - continue current dose of Xanax, wean off as tolerated. - Instructed patient to be re-established with Dr. Gwenlyn Saran, card provided. - follow up in 2 weeks

## 2017-06-05 ENCOUNTER — Telehealth: Payer: Self-pay | Admitting: Internal Medicine

## 2017-06-05 MED ORDER — FLUTICASONE FUROATE-VILANTEROL 100-25 MCG/INH IN AEPB: 1.0000 | INHALATION_SPRAY | Freq: Every day | RESPIRATORY_TRACT | 3 refills | Status: DC

## 2017-06-05 NOTE — Telephone Encounter (Signed)
Pt requesting 90 day supply of breo refill.  This has been sent to pharmacy.  Nothing further needed.

## 2017-06-06 ENCOUNTER — Telehealth: Payer: Self-pay | Admitting: Psychology

## 2017-06-06 NOTE — Telephone Encounter (Signed)
Patient called and left a VM to schedule an appointment.  Reviewed Dr. Mary Sella note.  Looks like it is mostly for medication management although non pharmacologic strategies for anxiety would be helpful as well.  Called patient back and left a VM.

## 2017-06-09 ENCOUNTER — Other Ambulatory Visit: Payer: Self-pay | Admitting: Family Medicine

## 2017-06-12 ENCOUNTER — Ambulatory Visit: Payer: Medicare Other | Admitting: Adult Health

## 2017-06-12 ENCOUNTER — Other Ambulatory Visit: Payer: Medicare Other

## 2017-06-12 NOTE — Progress Notes (Deleted)
[place]  [phone] [phone]) [phone]     ID: [name] DOB: [birth year]  MR#: [id number]  MLY#:[id number]  Patient Care Team: Lori Percy, DO as PCP - General (Family Medicine) Lori Lever, MD (Pulmonary Disease) Lori Sine, MD as Consulting Physician (Dermatology) Armbruster, Lori Raspberry, MD as Consulting Physician (Gastroenterology) Magrinat, Lori Dad, MD as Consulting Physician (Oncology) PCP: Lori Percy, DO GYN: SU:  OTHER MD: [name] M.D., [name] M.D., Lori Skeans MD, Lori Sine MD  CHIEF COMPLAINT: malignant melanoma, colon carcinoma, lung cancer  CURRENT TREATMENT: Observation   HISTORY OF PRESENT ILLNESS:  From the prior summary:  I know [name] from her remote history of lung carcinoma, which has not recurred. More recently, her significant other [name] noticed a lesion in her right posterior calf. He keeps a picture Atlas of melanoma lesions on his bathroom wall, and told her he was "99% sure" the lesion there would be a melanoma. The patient brought it to Dr. Marjean [name] attention and a punch biopsy was obtained 09/06/2014. This showed ([id number]) malignant melanoma, with a maximum thickness of 0.33 mm, anatomic level III, no ulceration, with negative deep margins but clear peripheral margins. The mitotic index was low, less than one per square millimeter. There was no evidence of lymphovascular invasion. There was brisk tumor infiltrating lymphocytes. Tumor regression was noted across the full aspect of the punch biopsy.   This was read as a pathologic stage TIa, and the patient was referred to Dr. Sarajane [name] for wide excision, performed 09/13/2014. Dr. Everett [name] note indicates a 1 cm margin around the initial biopsy site. The final pathology ([id number]) showed a tumor thickness of 1.15, anatomic level III, with ulceration seen only in the area of prior biopsy, (and therefore read as  negative). Peripheral as well as deep margins were free. Again the mitotic index was low there was no evidence of vascular invasion and tumor infiltrating lymphocytes were brisk. This was read as a pT2a lesion.   The patient is referred for further evaluation and treatment.  INTERVAL HISTORY: [name] returns today for follow-up of her remote lung cancer, and her more recent colon and melanoma cancers, as well as for routine health maintenance.  REVIEW OF SYSTEMS: .  PAST MEDICAL HISTORY: Past Medical History:  Diagnosis Date  . Acute bilateral low back pain without sciatica   . Acute congestive heart failure (HCC)    EF 20-25% on ECHO 05/02/2017  . Acute respiratory failure with hypoxia ([place]) 05/01/2017  . 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 Allergic rhinitis   . Allergy   . Anxiety disorder   . Aortic valve regurgitation 05/15/2017  . Asthma   . Chronic bronchitis   . Colon cancer ([place])    adenocarcinoma in a polyps 11-24-2014  . COPD (chronic obstructive pulmonary disease) ([place])   . Cough   . Dizziness and giddiness   . Dyspepsia   . Dyspnea 04/30/2017  . Emphysema of lung ([place])   . External hemorrhoids   . EXTERNAL HEMORRHOIDS 09/27/2009  . Female stress incontinence   . Genetic testing 08/18/2016   Negative for known pathogenic mutations within any of 25 genes on a Custom Panel through [employer].  One variant of uncertain significance (VUS) called "c.7187C>G (p.Thr2396Ser)" was found in one copy of the ATM gene.  This Custom Cancer Panel offered by [employer] includes sequencing and/or duplication/deletion testing of the following 25 genes: APC, ATM, AXIN2, BAP1, BMPR1A, BRCA1, BRCA2, CDH1, CDK4, CDKN2A,  CHEK2, EPCAM, MITF, MLH1, MSH2, MSH6, MUTYH, PMS2, POLD1, POLE, PTEN, SCG5/GREM1, SMAD4, STK11, and TP53. Date of report is July 23, 2016.  MSH2 Exons 1-7 Inversion Analysis was also negative through [employer].  Date of report is July 23, 2016.     Marland Kitchen GERD  (gastroesophageal reflux disease)    occ depending on diet   . Goiter   . History of lung cancer   . Hypercholesteremia    denies-last check normal labs  . Hypokalemia   . 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 Insomnia   . Lung cancer ([place])   . Migraine, unspecified, without mention of intractable migraine without mention of status migrainosus   . Panic attacks   . Seizures ([place])    very long ago like 15 years ago or so , questionable etilogy  . Skin cancer (melanoma) ([place])    right calf  . Snoring 12/11/2010   Longstanding hx of snore. NPSG was normal years ago.    Marland Kitchen Swelling of right lower extremity 12/01/2014    PAST SURGICAL HISTORY: Past Surgical History:  Procedure Laterality Date  . biopsy of right neck mass     negative-thyroid biopsy   . BREAST EXCISIONAL BIOPSY Right 1987   no visible scar  . BRONCHOSCOPY  01/31/2005   nonspecific inflammation  . BRONCHOSCOPY  08/24/2010   nonspecific inflammation  . COLONOSCOPY    . CORONARY STENT INTERVENTION N/A 05/12/2017   Procedure: CORONARY STENT INTERVENTION;  Surgeon: Charolette Forward, MD;  Location: [facility];  Service: Cardiovascular;  Laterality: N/A;  . LUNG REMOVAL, PARTIAL     for lung cancer--followed by Dr. Arlyce Dice and Dr. Annamaria Boots  . MELANOMA EXCISION WITH SENTINEL LYMPH NODE BIOPSY Right 11/29/2014   Procedure: RIGHT INGUINAL SENTINEL LYMPH NODE BIOPSY;  Surgeon: Lori Skeans, MD;  Location: [facility];  Service: General;  Laterality: Right;  . melanoma removal     right calf   . POLYPECTOMY    . portacath removed  10/2007  . RIGHT/LEFT HEART CATH AND CORONARY ANGIOGRAPHY N/A 05/06/2017   Procedure: RIGHT/LEFT HEART CATH AND CORONARY ANGIOGRAPHY;  Surgeon: [name], MD;  Location: [facility];  Service: Cardiovascular;  Laterality: N/A;  . SPIROMETRY  07/03/2002   min obstruction,mild restriction 01/31/2005  . tvh  12/01/2000   hysterectomy    FAMILY HISTORY Family History    Problem Relation Age of Onset  . Lung cancer Father        smoker and worked at a Academic librarian  . COPD Mother        not a smoker  . Other Mother        hx of hysterectomy for unspecified reason  . Asthma Daughter   . Diabetes Maternal Grandfather   . Colon polyps Brother        approx 16 polyps on his first colonoscopy  . Other Maternal Aunt        non-cancerous growth in lungs; respiratory issues; smoker  . COPD Maternal Grandmother        d. 7  . Cancer Paternal Grandfather        oral/mouth cancer; chewed tobacco; d. older age  . Throat cancer Other        maternal great aunt (MGF's sister); not a smoker  . Cirrhosis Paternal Uncle        hx of alcohol abuse   . Colon cancer Neg Hx   . Rectal cancer Neg Hx   .  Stomach cancer Neg Hx    the patient's father died in his mid 45s from lung cancer. The patient's mother is living, in her 7s. The patient has one brother, no sisters. There is no other history of cancer in the family to her knowledge  GYNECOLOGIC HISTORY:  No LMP recorded. Patient has had a hysterectomy. Menarche age 29, first live birth age 74. The patient is GX P1. She had a hysterectomy approximately 2001. She did not undergo salpingo-oophorectomy. She did not take hormone replacement  SOCIAL HISTORY:  She is disabled and lives by herself, with a cat. Her significant other, Aaron Edelman, works as a Chief Strategy Officer. Her daughter Theadora Rama, 24 years old as of March 2016, lives in [place] and in addition to working has 4 children. Brandy's husband works for an Tree surgeon, putting up signs    ADVANCED DIRECTIVES: Not in place. The patient intends to name her mother Worthy Flank as healthcare power of attorney. She can be reached at [phone]. At the 11/07/2014 visit patient received the appropriate documents 2 complete and notarize at her discretion   HEALTH MAINTENANCE: Social History  Substance Use Topics  . Smoking status: Former Smoker    Packs/day: 2.00     Years: 23.00    Types: Cigarettes    Start date: 09/02/1978    Quit date: 10/26/2001  . Smokeless tobacco: Never Used     Comment: Significant passive exposure from boy friend  . Alcohol use No     Comment: history of alcohol abuse 20-30    Mammography: March 2016  Colonoscopy: Due 10/2016  PAP: Status post hysterectomy  Bone density:  Lipid panel:  No Known Allergies  Current Outpatient Prescriptions  Medication Sig Dispense Refill  . Albuterol Sulfate (PROAIR RESPICLICK) 329 (90 Base) MCG/ACT AEPB Inhale 2 puffs into the lungs every 6 (six) hours as needed. 1 each 4  . ALPRAZolam (XANAX) 1 MG tablet Take 1 tablet (1 mg total) by mouth 2 (two) times daily as needed for anxiety. 60 tablet 1  . aspirin 81 MG EC tablet Take 1 tablet (81 mg total) by mouth daily. 90 tablet 1  . atorvastatin (LIPITOR) 40 MG tablet Take 1 tablet (40 mg total) by mouth daily at 6 PM. 90 tablet 1  . atorvastatin (LIPITOR) 40 MG tablet TAKE 1 TABLET BY MOUTH EVERY DAY AT 6PM 90 tablet 2  . carvedilol (COREG) 3.125 MG tablet Take 1 tablet (3.125 mg total) by mouth 2 (two) times daily with a meal. 180 tablet 2  . carvedilol (COREG) 3.125 MG tablet TAKE 1 TABLET BY MOUTH TWICE DAILY WITH A MEAL 180 tablet 2  . citalopram (CELEXA) 20 MG tablet TAKE 1 TABLET(20 MG) BY MOUTH DAILY 90 tablet 0  . clopidogrel (PLAVIX) 75 MG tablet Take 1 tablet (75 mg total) by mouth daily. 90 tablet 1  . clopidogrel (PLAVIX) 75 MG tablet TAKE 1 TABLET BY MOUTH DAILY 90 tablet 2  . digoxin (LANOXIN) 0.125 MG tablet Take 1 tablet (0.125 mg total) by mouth daily. (Patient taking differently: Take 0.0625 mg by mouth daily. ) 90 tablet 3  . fluticasone furoate-vilanterol (BREO ELLIPTA) 100-25 MCG/INH AEPB Inhale 1 puff into the lungs daily. 180 each 3  . furosemide (LASIX) 40 MG tablet Take 1 tablet (40 mg total) by mouth daily. Take one tablet (55m total) by mouth daily as needed for increased swelling or weight gain >2lb 90 tablet 1  .  furosemide (LASIX) 40 MG tablet TAKE 1 TABLET( 40  MG TOTAL) BY MOUTH DAILY AS NEEDED FOR INCREASED SWELLING OR WEIGH TGAIN MORE THAN 2 LB 90 tablet 2  . ipratropium-albuterol (DUONEB) 0.5-2.5 (3) MG/3ML SOLN Take 3 mLs by nebulization every 6 (six) hours as needed. 360 mL 1  . losartan (COZAAR) 25 MG tablet Take 1 tablet (25 mg total) by mouth daily. 90 tablet 1  . losartan (COZAAR) 25 MG tablet TAKE 1 TABLET BY MOUTH EVERY DAY 90 tablet 2  . nitroGLYCERIN (NITROSTAT) 0.4 MG SL tablet Place 1 tablet (0.4 mg total) under the tongue every 5 (five) minutes as needed for chest pain. 30 tablet 12  . polyethylene glycol powder (GLYCOLAX/MIRALAX) powder Take 17 g by mouth 2 (two) times daily as needed. 225 g 5  . spironolactone (ALDACTONE) 25 MG tablet Take 0.5 tablets (12.5 mg total) by mouth daily. 45 tablet 1  . spironolactone (ALDACTONE) 25 MG tablet TAKE 1/2 TABLET BY MOUTH EVERY DAY 45 tablet 2  . zolpidem (AMBIEN) 5 MG tablet 1 at bedtime for sleep as needed 30 tablet 0   No current facility-administered medications for this visit.     OBJECTIVE: There were no vitals filed for this visit.   There is no height or weight on file to calculate BMI.    ECOG FS:1 - Symptomatic but completely ambulatory  GENERAL: Patient is a well appearing female in no acute distress HEENT:  Sclerae anicteric.  Oropharynx clear and moist. No ulcerations or evidence of oropharyngeal candidiasis. Neck is supple.  NODES:  No cervical, supraclavicular, or axillary lymphadenopathy palpated.  BREAST EXAM:  Deferred. LUNGS:  Clear to auscultation bilaterally.  No wheezes or rhonchi. HEART:  Regular rate and rhythm. No murmur appreciated. ABDOMEN:  Soft, nontender.  Positive, normoactive bowel sounds. No organomegaly palpated. MSK:  No focal spinal tenderness to palpation. Full range of motion bilaterally in the upper extremities. EXTREMITIES:  No peripheral edema.   SKIN:  Clear with no obvious rashes or skin changes. No  nail dyscrasia. NEURO:  Nonfocal. Well oriented.  Appropriate affect.      LAB RESULTS:  CMP     Component Value Date/Time   NA 138 05/15/2017 1141   NA 141 12/11/2016 1106   K 4.4 05/15/2017 1141   K 3.5 12/11/2016 1106   CL 98 05/15/2017 1141   CL 111 (H) 07/09/2012 0958   CO2 23 05/15/2017 1141   CO2 22 12/11/2016 1106   GLUCOSE 74 05/15/2017 1141   GLUCOSE 109 (H) 05/13/2017 0143   GLUCOSE 84 12/11/2016 1106   GLUCOSE 80 07/09/2012 0958   BUN 14 05/15/2017 1141   BUN 8.1 12/11/2016 1106   CREATININE 0.83 05/15/2017 1141   CREATININE 0.8 12/11/2016 1106   CALCIUM 9.6 05/15/2017 1141   CALCIUM 9.0 12/11/2016 1106   PROT 7.5 12/11/2016 1106   ALBUMIN 3.9 12/11/2016 1106   AST 13 12/11/2016 1106   ALT 9 12/11/2016 1106   ALKPHOS 75 12/11/2016 1106   BILITOT 0.49 12/11/2016 1106   GFRNONAA 81 05/15/2017 1141   GFRAA 94 05/15/2017 1141    INo results found for: SPEP, UPEP  Lab Results  Component Value Date   WBC 6.7 05/13/2017   NEUTROABS 6.0 04/30/2017   HGB 12.0 05/13/2017   HCT 35.3 (L) 05/13/2017   MCV 85.3 05/13/2017   PLT 181 05/13/2017      Chemistry      Component Value Date/Time   NA 138 05/15/2017 1141   NA 141 12/11/2016 1106   K  4.4 05/15/2017 1141   K 3.5 12/11/2016 1106   CL 98 05/15/2017 1141   CL 111 (H) 07/09/2012 0958   CO2 23 05/15/2017 1141   CO2 22 12/11/2016 1106   BUN 14 05/15/2017 1141   BUN 8.1 12/11/2016 1106   CREATININE 0.83 05/15/2017 1141   CREATININE 0.8 12/11/2016 1106      Component Value Date/Time   CALCIUM 9.6 05/15/2017 1141   CALCIUM 9.0 12/11/2016 1106   ALKPHOS 75 12/11/2016 1106   AST 13 12/11/2016 1106   ALT 9 12/11/2016 1106   BILITOT 0.49 12/11/2016 1106       Lab Results  Component Value Date   LABCA2 26 07/21/2007    No components found for: QBVQX450  No results for input(s): INR in the last 168 hours.  Urinalysis    Component Value Date/Time   LABSPEC 1.005 07/16/2012 1207   PHURINE  6.0 07/16/2012 1207   HGBUR Negative 07/16/2012 1207   BILIRUBINUR Negative 07/16/2012 1207   KETONESUR Negative 07/16/2012 1207   PROTEINUR Negative 07/16/2012 1207   NITRITE Negative 07/16/2012 1207   LEUKOCYTESUR Trace 07/16/2012 1207    STUDIES: No results found.  ASSESSMENT: 51 y.o. [place] woman  A: LUNG CANCER  (1) status post right upper lobe wedge resection of a T1 N2, stage IIIA non-small cell lung cancer (adenocarcinoma) measuring 1.8 cm, poorly differentiated, with negative margins, but with 4 positive lymph nodes, 3 N1 and 1 N2;   (a) on [place] 2004 to 2009  (b) no evidence of disease recurrence on most recent chest x-ray 08/19/2014, and likely cured  (2) ashthmatic bronchitis/ COPD  (3) tobacco abuse: The patient quit smoking 2001  B: MALIGNANT MELANOMA  (4) s/p punch biopsy Right posterior calf for malignant melanoma 0.33 mm thickness, level III, w/o ulceration and with a low mitotic index, free deep but positive lateral margins  (5) status post wide excision 09/13/2014 showing a maximum tumor thickness of 1.15 mm, and anatomic level III, with ulceration noted only at the site of prior biopsy, with clear margins( 1 cm per surgical note) with a low mitotic index and brisk infiltration by tumor infiltrating lymphocytes: pT2a NX MX = stage IB  (6) right inguinal sentinel lymph node biopsy 11/29/2014 showed no evidence of melanoma  C: COLON CANCER (7) colonoscopy 11/29/2014 shows adenocarcinoma in  ascending colon polyp; repeat colonoscopy 12/30/2014 removed a tubular adenoma in that area which was negative for malignancy. The area was marked for possible definitive surgery  (a) Repeat colonoscopy with polypectomy 12/30/2014 showed tubular adenomas.  D: GENETICS (8) Genetic testing 07/23/2016 through the [employer] Panel (with MSH2 Exons 1-7 Inversion Analysis) found no known deleterious mutations.in APC, ATM, AXIN2, BAP1, BMPR1A, BRCA1, BRCA2, CDH1,  CDK4, CDKN2A, CHEK2, EPCAM, MITF, MLH1, MSH2, MSH6, MUTYH, PMS2, POLD1, POLE, PTEN, SCG5/GREM1, SMAD4, STK11, and TP53.   (a) One variant of uncertain significance (VUS) called "c.7187C>G (p.Thr2396Ser)" was found in one copy of the ATM gene     PLAN: Keyira is now 14 years out from initial diagnosis of lung cancer with no evidence of disease recurrence. This is very favorable.  She apparently missed an appointment with Dr. Havery Moros and is behind on her surveillance colonoscopy. We have requested to have that appointment scheduled so she does not fall behind.  She is behind on her mammography and I have gone ahead and enter the appropriate orders that she can have that done this coming month.  She continues to have excellent  dermatologic follow-up as well. The lesions she showed me today were of concern to her all appear clearly benign.  We discussed her genetics which do not show evidence of a deleterious mutation. She tells me a cousin may have been found to carry a mutation and I have asked her to request his status so we can added to her testing if appropriate.  Otherwise she will return to see [place] in 6 months. She knows to call for any problems that may develop before that visit.  Scot Dock, NP   06/12/2017 10:59 AM Medical Oncology and Hematology [facility] [address] [place], [place] [address] Tel. [phone]    Fax. [phone]

## 2017-06-16 ENCOUNTER — Encounter (HOSPITAL_COMMUNITY): Payer: Self-pay | Admitting: Cardiology

## 2017-06-16 ENCOUNTER — Ambulatory Visit (HOSPITAL_COMMUNITY)
Admission: RE | Admit: 2017-06-16 | Discharge: 2017-06-16 | Disposition: A | Discharge status: Home / Self Care | Payer: Medicare Other | Source: Ambulatory Visit | Attending: Cardiology | Admitting: Cardiology

## 2017-06-16 VITALS — BP 91/54 | HR 88 | Wt 156.2 lb

## 2017-06-16 DIAGNOSIS — Z85038 Personal history of other malignant neoplasm of large intestine: Secondary | ICD-10-CM | POA: Insufficient documentation

## 2017-06-16 DIAGNOSIS — I429 Cardiomyopathy, unspecified: Secondary | ICD-10-CM | POA: Insufficient documentation

## 2017-06-16 DIAGNOSIS — Z85820 Personal history of malignant melanoma of skin: Secondary | ICD-10-CM | POA: Diagnosis not present

## 2017-06-16 DIAGNOSIS — I38 Endocarditis, valve unspecified: Secondary | ICD-10-CM | POA: Diagnosis not present

## 2017-06-16 DIAGNOSIS — Z87891 Personal history of nicotine dependence: Secondary | ICD-10-CM | POA: Diagnosis not present

## 2017-06-16 DIAGNOSIS — I4581 Long QT syndrome: Secondary | ICD-10-CM | POA: Insufficient documentation

## 2017-06-16 DIAGNOSIS — Z7902 Long term (current) use of antithrombotics/antiplatelets: Secondary | ICD-10-CM | POA: Insufficient documentation

## 2017-06-16 DIAGNOSIS — J449 Chronic obstructive pulmonary disease, unspecified: Secondary | ICD-10-CM | POA: Diagnosis not present

## 2017-06-16 DIAGNOSIS — Z79899 Other long term (current) drug therapy: Secondary | ICD-10-CM | POA: Diagnosis not present

## 2017-06-16 DIAGNOSIS — Z85118 Personal history of other malignant neoplasm of bronchus and lung: Secondary | ICD-10-CM | POA: Insufficient documentation

## 2017-06-16 DIAGNOSIS — E049 Nontoxic goiter, unspecified: Secondary | ICD-10-CM | POA: Insufficient documentation

## 2017-06-16 DIAGNOSIS — I2510 Atherosclerotic heart disease of native coronary artery without angina pectoris: Secondary | ICD-10-CM | POA: Diagnosis not present

## 2017-06-16 DIAGNOSIS — Z8371 Family history of colonic polyps: Secondary | ICD-10-CM | POA: Diagnosis not present

## 2017-06-16 DIAGNOSIS — Z7982 Long term (current) use of aspirin: Secondary | ICD-10-CM | POA: Insufficient documentation

## 2017-06-16 DIAGNOSIS — Z955 Presence of coronary angioplasty implant and graft: Secondary | ICD-10-CM | POA: Insufficient documentation

## 2017-06-16 DIAGNOSIS — I5022 Chronic systolic (congestive) heart failure: Secondary | ICD-10-CM | POA: Diagnosis not present

## 2017-06-16 LAB — BASIC METABOLIC PANEL
Anion gap: 8 (ref 5–15)
BUN: 8 mg/dL (ref 6–20)
CALCIUM: 9.1 mg/dL (ref 8.9–10.3)
CO2: 25 mmol/L (ref 22–32)
CREATININE: 0.68 mg/dL (ref 0.44–1.00)
Chloride: 106 mmol/L (ref 101–111)
GFR calc Af Amer: >60 mL/min (ref >60)
GFR calc non Af Amer: >60 mL/min (ref >60)
GLUCOSE: 92 mg/dL (ref 65–99)
Potassium: 3.4 mmol/L — ABNORMAL LOW (ref 3.5–5.1)
Sodium: 139 mmol/L (ref 135–145)

## 2017-06-16 LAB — LIPID PANEL
Cholesterol: 118 mg/dL (ref 0–200)
HDL: 35 mg/dL — AB (ref >40)
LDL CALC: 63 mg/dL (ref 0–99)
Total CHOL/HDL Ratio: 3.4 RATIO
Triglycerides: 102 mg/dL (ref <150)
VLDL: 20 mg/dL (ref 0–40)

## 2017-06-16 LAB — TSH: TSH: 0.573 u[IU]/mL (ref 0.350–4.500)

## 2017-06-16 LAB — DIGOXIN LEVEL: DIGOXIN LVL: 0.4 ng/mL — AB (ref 0.8–2.0)

## 2017-06-16 MED ORDER — SPIRONOLACTONE 25 MG PO TABS: 25.0000 mg | ORAL_TABLET | Freq: Every day | ORAL | 3 refills | Status: DC

## 2017-06-16 NOTE — Patient Instructions (Signed)
Increase Spironolactone 25 mg (1 tab) daily  You have been referred to Cardiac Rehab (they will call you)  Labs drawn today (if we do not call you, then your lab work was stable)   Your physician recommends that you return for lab work in: 10 days   Your physician recommends that you schedule a follow-up appointment in: 1 month in [facility]

## 2017-06-17 ENCOUNTER — Encounter: Payer: Self-pay | Admitting: Internal Medicine

## 2017-06-17 ENCOUNTER — Ambulatory Visit (INDEPENDENT_AMBULATORY_CARE_PROVIDER_SITE_OTHER): Payer: Medicare Other | Admitting: Internal Medicine

## 2017-06-17 VITALS — BP 124/78 | HR 88 | Ht 65.0 in | Wt 155.0 lb

## 2017-06-17 DIAGNOSIS — J4541 Moderate persistent asthma with (acute) exacerbation: Secondary | ICD-10-CM | POA: Diagnosis not present

## 2017-06-17 DIAGNOSIS — R0609 Other forms of dyspnea: Secondary | ICD-10-CM | POA: Diagnosis not present

## 2017-06-17 DIAGNOSIS — J449 Chronic obstructive pulmonary disease, unspecified: Secondary | ICD-10-CM | POA: Diagnosis not present

## 2017-06-17 DIAGNOSIS — J9611 Chronic respiratory failure with hypoxia: Secondary | ICD-10-CM | POA: Diagnosis not present

## 2017-06-17 LAB — PULMONARY FUNCTION TEST
DL/VA % PRED: 60 %
DL/VA: 2.96 ml/min/mmHg/L
DLCO UNC: 8.22 ml/min/mmHg
DLCO cor % pred: 32 %
DLCO cor: 8.41 ml/min/mmHg
DLCO unc % pred: 32 %
FEF 25-75 Post: 0.74 L/sec
FEF 25-75 Pre: 0.53 L/sec
FEF2575-%Change-Post: 40 %
FEF2575-%PRED-POST: 27 %
FEF2575-%Pred-Pre: 19 %
FEV1-%Change-Post: 12 %
FEV1-%PRED-POST: 38 %
FEV1-%Pred-Pre: 34 %
FEV1-PRE: 0.98 L
FEV1-Post: 1.1 L
FEV1FVC-%Change-Post: 3 %
FEV1FVC-%Pred-Pre: 77 %
FEV6-%Change-Post: 8 %
FEV6-%PRED-PRE: 45 %
FEV6-%Pred-Post: 49 %
FEV6-PRE: 1.58 L
FEV6-Post: 1.72 L
FEV6FVC-%CHANGE-POST: 0 %
FEV6FVC-%PRED-PRE: 103 %
FEV6FVC-%Pred-Post: 103 %
FVC-%CHANGE-POST: 9 %
FVC-%Pred-Post: 47 %
FVC-%Pred-Pre: 43 %
FVC-POST: 1.73 L
FVC-Pre: 1.58 L
POST FEV1/FVC RATIO: 64 %
PRE FEV6/FVC RATIO: 100 %
Post FEV6/FVC ratio: 100 %
Pre FEV1/FVC ratio: 62 %
RV % PRED: 122 %
RV: 2.33 L
TLC % pred: 77 %
TLC: 4.03 L

## 2017-06-17 MED ORDER — SPIRONOLACTONE 25 MG PO TABS: 25.0000 mg | ORAL_TABLET | Freq: Every day | ORAL | 3 refills | Status: DC

## 2017-06-17 MED ORDER — ALBUTEROL SULFATE 108 (90 BASE) MCG/ACT IN AEPB: 2.0000 | INHALATION_SPRAY | Freq: Four times a day (QID) | RESPIRATORY_TRACT | 3 refills | Status: DC | PRN

## 2017-06-17 MED ORDER — ZOLPIDEM TARTRATE 5 MG PO TABS: ORAL_TABLET | ORAL | 5 refills | Status: DC

## 2017-06-17 NOTE — Progress Notes (Addendum)
PCP: Dr. McDiarmid HF Cardiology: Bensimhon  51 yo with history of CAD and cardiomyopathy probably out of proportion to CAD presents for followup of CHF.  Patient has a history of COPD, no longer smokes.   She was hospitalized in 9/18 with worsening dyspnea.  Echo showed EF 20-25%.  Cath showed 70% ostial left main stenosis.  PFTs were suggestive of severe COPD.  She was evaluated for CABG but this was considered too high risk.  Instead, she had PCI to ostial left main with DES.  She was also diuresed.    She is now home again.  She feels like she is doing well.  She takes Lasix every 2-3 days and weight is staying stable.  BP is soft but she denies lightheadedness.  She is short of breath after walking about 100 yards.  No chest pain.  No orthopnea/PND. No palpitations.    ECG (personally reviewed): NSR, 1st degree AV block, diffuse T wave inversions.   Labs (9/18): digoxin 1.1, K 4.4, creatinine 0.83  PMH: 1. COPD: Quit smoking 2003.  PFTs (9/18) with severe obstruction.  2. Chronic systolic CHF: ?Mixed cardiomyopathy (out of proportion to degree of CAD).  - Echo (8/18): EF 20-25%, inferior/inferoseptal akinesis, mildly dilated RV with moderately decreased systolic function, mild-moderate AI, mild MR.  - RHC (9/18): mean RA 5, PA 44/22, mean PCWP 7, CI 1.62 Fick/2.21 thermodilution.  3. CAD: LHC (9/18) with 70% ostial left main.  DES to ostial LM.  4. Non-small cell lung CA: 2003.  She had right upper lobectomy, chemotherapy (Iressa), and radiation.  5. H/o colon cancer: resection 2016.  6. H/o melanoma right leg, s/;p resection.   Social History   Social History  . Marital status: Widowed    Spouse name: N/A  . Number of children: 1  . Years of education: 10   Occupational History  . Disabled- waitress Unemployed   Social History Main Topics  . Smoking status: Former Smoker    Packs/day: 2.00    Years: 23.00    Types: Cigarettes    Start date: 09/02/1978    Quit date:  10/26/2001  . Smokeless tobacco: Never Used     Comment: Significant passive exposure from boy friend  . Alcohol use No     Comment: history of alcohol abuse 20-30  . Drug use: No     Comment: +MJ  . Sexual activity: Not on file   Other Topics Concern  . Not on file   Social History Narrative   Health Care POA:    Emergency Contact:    End of Life Plan:    Who lives with you: self   Any pets: cat, Bart   Diet: Pt has a varied diet but reports not eating much because she is afraid of weight gain.   Exercise: Pt has no regular exercise routine.   Seatbelts: Pt reports wearing seatbelt when in vehicles.    Hobbies: movies         Family History  Problem Relation Age of Onset  . Lung cancer Father        smoker and worked at a [employer]  . COPD Mother        not a smoker  . Other Mother        hx of hysterectomy for unspecified reason  . Asthma Daughter   . Diabetes Maternal Grandfather   . Colon polyps Brother        approx 16 polyps on his  first colonoscopy  . Other Maternal Aunt        non-cancerous growth in lungs; respiratory issues; smoker  . COPD Maternal Grandmother        d. 98  . Cancer Paternal Grandfather        oral/mouth cancer; chewed tobacco; d. older age  . Throat cancer Other        maternal great aunt (MGF's sister); not a smoker  . Cirrhosis Paternal Uncle        hx of alcohol abuse   . Colon cancer Neg Hx   . Rectal cancer Neg Hx   . Stomach cancer Neg Hx    ROS: All systems reviewed and negative except as per HPI.   Current Outpatient Prescriptions  Medication Sig Dispense Refill  . Albuterol Sulfate (PROAIR RESPICLICK) 706 (90 Base) MCG/ACT AEPB Inhale 2 puffs into the lungs every 6 (six) hours as needed. 1 each 4  . ALPRAZolam (XANAX) 1 MG tablet Take 1 tablet (1 mg total) by mouth 2 (two) times daily as needed for anxiety. 60 tablet 1  . aspirin 81 MG EC tablet Take 1 tablet (81 mg total) by mouth daily. 90 tablet 1  . atorvastatin  (LIPITOR) 40 MG tablet Take 1 tablet (40 mg total) by mouth daily at 6 PM. 90 tablet 1  . carvedilol (COREG) 3.125 MG tablet Take 1 tablet (3.125 mg total) by mouth 2 (two) times daily with a meal. 180 tablet 2  . citalopram (CELEXA) 20 MG tablet TAKE 1 TABLET(20 MG) BY MOUTH DAILY 90 tablet 0  . clopidogrel (PLAVIX) 75 MG tablet Take 1 tablet (75 mg total) by mouth daily. 90 tablet 1  . digoxin (LANOXIN) 0.125 MG tablet Take 0.0625 mg by mouth daily.    . fluticasone furoate-vilanterol (BREO ELLIPTA) 100-25 MCG/INH AEPB Inhale 1 puff into the lungs daily. 180 each 3  . furosemide (LASIX) 40 MG tablet TAKE 1 TABLET( 40 MG TOTAL) BY MOUTH DAILY AS NEEDED FOR INCREASED SWELLING OR WEIGH TGAIN MORE THAN 2 LB 90 tablet 2  . ipratropium-albuterol (DUONEB) 0.5-2.5 (3) MG/3ML SOLN Take 3 mLs by nebulization every 6 (six) hours as needed. 360 mL 1  . losartan (COZAAR) 25 MG tablet Take 1 tablet (25 mg total) by mouth daily. 90 tablet 1  . nitroGLYCERIN (NITROSTAT) 0.4 MG SL tablet Place 1 tablet (0.4 mg total) under the tongue every 5 (five) minutes as needed for chest pain. 30 tablet 12  . polyethylene glycol powder (GLYCOLAX/MIRALAX) powder Take 17 g by mouth 2 (two) times daily as needed. 225 g 5  . spironolactone (ALDACTONE) 25 MG tablet Take 1 tablet (25 mg total) by mouth daily. 30 tablet 3  . zolpidem (AMBIEN) 5 MG tablet 1 at bedtime for sleep as needed 30 tablet 0   No current facility-administered medications for this encounter.    BP (!) 91/54   Pulse 88   Wt 156 lb 4 oz (70.9 kg)   SpO2 98%   BMI 26.00 kg/m  General: NAD Neck: No JVD, goiter noted.  Lungs: Clear to auscultation bilaterally with normal respiratory effort. CV: Nondisplaced PMI.  Heart regular S1/S2, no S3/S4, no murmur.  No peripheral edema.  No carotid bruit.  Normal pedal pulses.  Abdomen: Soft, nontender, no hepatosplenomegaly, no distention.  Skin: Intact without lesions or rashes.  Neurologic: Alert and oriented x  3.  Psych: Normal affect. Extremities: No clubbing or cyanosis.  HEENT: Normal.   Assessment/Plan: 1. Chronic systolic  CHF: Echo 9/18 with EF 20-25%.  She has significant coronary disease, but I think that the degree of LV dysfunction is out of proportion to CAD.  Suspect mixed versus nonischemic cardiomyopathy. NYHA class II symptoms, she is not volume overloaded on exam.  Some of the dyspnea is likely due to COPD. BP is soft.  - Continue current Coreg and losartan.  BP too soft to transition to [employer].  - Increase spironolactone to 25 mg daily.  BMET today and again in 10 days.   - She can continue Lasix as she is doing (prn).   - She will need repeat echo for ICD consideration in 12/18. QRS is too short for benefit from CRT.  - Continue digoxin, check level (she is on a lower dose than when last level was collected).  - I would like her to do cardiac rehab.  2. CAD: s/p DES to LM.  No chest pain.   - Continue ASA 81, Plavix 75. Will likely continue Plavix long-term.  - Continue atorvastatin 40 daily. Check lipids today.  3. Goiter: Will check TSH.  4. COPD: Severe by 9/18 PFTs.  No longer smoking.  Follows with Dr. Annamaria Boots.   Followup with NP/PA in 1 month for med titration.   [name] 06/17/2017

## 2017-06-17 NOTE — Progress Notes (Signed)
Patient ID: [name], female    DOB: [birth year], 51 y.o.   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 Walk test on room air 06/28/2016-95%, 95%, 95%, 93%, peak heart rate 120/minute. No desaturation after 3185 feet. Office Spirometry 06/28/2016-limited validity due to cough. Restriction of exhaled volume. FVC 1.79/50%, FEV1 1.64/57%, ratio 0.80. FENO- 5 Walk Test on room Air- Qualified for home O2 04/10/17 PFT 06/17/17- severe obstruction, mild restriction, diffusion severely reduced. Insignificant response to bronchodilator. 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 No O2 at home  ProAir, Breo 100, neb albuterol,  Walk Test on room Air- Qualified for home O2-room air resting 92%, desaturated on first left 87%, required 3 L to return to 93% during ambulation. Increased shortness of breath on exertion for 6 weeks with some orthopnea. Denies chest pain, palpitations feet may swell a little at times. She has an oximeter at home and says saturation often around 86% with any  activity. "Really bad snoring" but doesn't know about witnessed apneas. We can come back to this. Using her rescue inhaler several times daily which may explain sustained rapid heart rate. Denies fever, purulent sputum, adenopathy. CXR 12/11/16 FINDINGS: Right upper lobectomy. Soft tissue fullness around the resection site is unchanged most compatible with scarring and volume loss. No change. Negative for heart failure. No infiltrate effusion or recurrent mass lesion. 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 Stent. Added Plavix, spironolactone, Coreg, Cozaar. Breathing is much improved after medical management of CHF during recent hospitalization. She was deemed not a candidate for CABG because of severe COPD. Saw cardiology yesterday and Spiriva lactone dose was increased. She asks I adjust the prescription to cover the amount, sent to the correct drugstore. Notices some croupy dry cough. Rescue inhaler helps. She comes without oxygen today. CTa chest 04/30/17- No evidence of pulmonary embolism. Increased cardiomegaly, and reflux of contrast into IVC hepatic veins consistent with right heart insufficiency. Increased symmetric ground-glass pulmonary opacity, likely due to mild diffuse pulmonary edema. New tiny right pleural effusion. Stable posttreatment changes in right hemithorax. No evidence of recurrent or metastatic carcinoma within the thorax. Aortic Atherosclerosis (ICD10-I70.0) and Emphysema (ICD10-J43.9). PFT 06/17/17- severe obstruction, mild restriction, diffusion severely reduced.  Insignificant response to bronchodilator. FVC 1.73/47%, FEV1 1.10/38%, ratio 0.64, TLC 77%, DLCO 32%  Review of Systems-+ = pos Constitutional:   No-   weight loss, night sweats, +fevers, chills, fatigue, lassitude. HEENT:   No-  headaches, difficulty swallowing, tooth/dental problems, sore throat,  mild sneezing,no- itching, ear ache, nasal congestion, post nasal drip,  CV:  No-   chest pain, orthopnea, PND, +swelling in lower extremities, anasarca, dizziness, palpitations Resp: + shortness of breath with exertion or at rest.              productive cough, + non-productive cough,  No- coughing up of blood.               change in color of mucus.  wheezing.   Skin: No-   rash or lesions. GI:  No-   heartburn, indigestion, abdominal pain, nausea, vomiting,  GU:   MS:  No-   joint pain or swelling.   Neuro-     nothing unusual Psych:  No- change in mood or affect. No depression or anxiety.  No memory loss.   Objective:   Physical Exam General- Alert, Oriented, Affect-appropriate, Distress - NAD  Room air 91% Skin- rash-none, lesions- none, excoriation- none.  Lymphadenopathy- none Head- atraumatic            Eyes- Gross vision intact, PERRLA, conjunctivae clear secretions            Ears- Hearing, canals-normal            Nose- , no-Septal dev, mucus, polyps, erosion, perforation             Throat- Mallampati II , mucosa clear , drainage- none, tonsils- atrophic, + hoarse Neck- flexible , trachea midline, no stridor , thyroid nl, carotid no bruit Chest - symmetrical excursion , unlabored           Heart/CV- RRR rapid , no murmur , no gallop  , no rub, nl s1 s2                           - JVD none , edema- none, stasis changes- none, varices- none           Lung-   Wheeze-none, cough -none, dullness-none, rub- none. + Few crackles left base           Chest wall-  Abd-  Br/ Gen/ Rectal- Not done, not indicated Extrem- cyanosis- none, clubbing, none, atrophy- none, strength- nl,      negative Homans Neuro- grossly intact to observation

## 2017-06-17 NOTE — Assessment & Plan Note (Signed)
Severe COPD. Formal PFT results are consistent with office spirometry obtained at time of new onset CHF. Plan-consider change aerosol meds at next visit. She needs to stabilize her cardiac status. Also needs clarification of O2 needs.

## 2017-06-17 NOTE — Patient Instructions (Signed)
Refill script printed for [facility] and sent for spironolactone and [place] on room air  [facility])   Dx COPD mixed type  Please call as needed

## 2017-06-17 NOTE — Addendum Note (Signed)
Encounter addended by: Larey Dresser, MD on: 06/17/2017  9:32 AM<BR>    Actions taken: Sign clinical note

## 2017-06-17 NOTE — Assessment & Plan Note (Signed)
Resting saturation is improved with treatment of CHF. Plan-overnight oximetry on room air

## 2017-06-17 NOTE — Progress Notes (Signed)
PFT completed today 06/17/17.
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
## 2017-06-18 ENCOUNTER — Ambulatory Visit (INDEPENDENT_AMBULATORY_CARE_PROVIDER_SITE_OTHER): Payer: Medicare Other | Admitting: Family Medicine

## 2017-06-18 ENCOUNTER — Encounter: Payer: Self-pay | Admitting: Family Medicine

## 2017-06-18 ENCOUNTER — Ambulatory Visit (INDEPENDENT_AMBULATORY_CARE_PROVIDER_SITE_OTHER): Payer: Medicare Other | Admitting: Psychology

## 2017-06-18 DIAGNOSIS — F429 Obsessive-compulsive disorder, unspecified: Secondary | ICD-10-CM | POA: Diagnosis not present

## 2017-06-18 DIAGNOSIS — F411 Generalized anxiety disorder: Secondary | ICD-10-CM | POA: Diagnosis not present

## 2017-06-18 MED ORDER — ALPRAZOLAM 1 MG PO TABS: 1.0000 mg | ORAL_TABLET | Freq: Two times a day (BID) | ORAL | 1 refills | Status: DC | PRN

## 2017-06-18 NOTE — Progress Notes (Signed)
Presenting Issue: Referred by PCP, initially attested that she did not know reason, but with further questioning revealed concern regarding having her Xanax "taken away" for treatment of her anxiety. She was recently started on Celexa 20 mg. Goals were discussed to meet with patient to establish care and to get a better history and establish a more accurate diagnosis.    Report of symptoms: Describes symptoms of worrying about everything from "tree falling on her house" to having her water bottle "squeezed the wrong way". Reports that objects have to be placed in specific spots and that she is greatly bothered if they are moved. She does not know why that she feels that way. She also feels that she has to wash her hands twenty times a day to keep them clean. She buys much more at the grocery store (e.g. Cheese) than she needs for fear of running out and then ends up with to much at her house taking up to a year to get through it. She also has to check her door locks 3 times every night for fear that someone would break in. Also endorses fear of tight spaces, such as a recent medical test she needed to assess her lung function in a "tight" box. She also endorses being disturbed by large crowds, such as at a busy supermarket in which she has to immediately leave. She does not endorse having to stay awake as if she did not need sleep.   Of note, during her visit she had to continually rearrange small desk picture of a smiley face multiple times. She also reacted very shocked to the appearance of gnat that flew around the room.   Duration of CURRENT symptoms: regularly since age ~77-26 years old. She also previously had a history of "picking her skin" resulting in scaring. But this has seen resolved for several years. Her symptoms are improved when taking her Xanax. They affect her mood about 45 minutes after taking. Anxious feelings return in the evening.   Impact on function: Finds that she cannot sleep    Psychiatric History - Diagnoses: Per report, OCD from previous MD as she was acquiring disability approval for COPD, other diagnosis in past charts include panic attacks and  - Hospitalizations:  Most recently on 04/30/17 for new onset heart failure - Pharmacotherapy: Currently on:   - Xanax 1 mg BID, once in the morning at 11:00 and once at night  - Celexa 20 mg POQD, says that it makes her head feel   - previously tried clonopin, but patient reports that she did not tolerate  this well.  - Outpatient therapy: No history of therapy, denies utility of diaphragmatic breathing and what sound like "mindfulness" techniques.   Family history of psychiatric issues:No formal diagnosis, but does report father needing to unplug every outlet before leaving the home.   Current and history of substance use: Not assess.   Other: Not assess at this visit. Consider trauma, interpersonal violence)  PHQ-9: 10, with scores of 3s in both difficulty sleeping, and feeling fidgety.  MDQ:Not obtained GAD7: 17 at last PCP visit, not obtained today  Assessment and Plan:  Patient actively demonstrated compulsive tendencies during today visit. She has a strong history of compulsive tendencies at home with elements of hypochondriasis, agoraphobia, generalized anxiety, and claustrophobia. She did not seems to demonstrate as strong correlation of obsessive/intrusive thoughts leading to compulsions, but this may have been obscured by her baseline anxiety. She has previously tested high on both the  GAD7 and PHQ9.   - continue Xanax, patient has strict anxiety of losing this and may decline further care if this is hastily stopped. Appears to be stable user.   - cont Celexa, this is first line for both OCD and comorbid GAD and can help with specific phobias. Note OCD tends to require high dosages than maintenance therapy of depression (e.g. Up to 40mg /day for celexa) and may require gradual titration up to 4 months to see  full effects  - Consider CBT specific for OCD and GAD to assist with further symptom management  - Have patient return in 1 month to reassess symptoms

## 2017-06-18 NOTE — Assessment & Plan Note (Signed)
GAD today 20. Endorsing some headaches and "buzzing" sensation since starting the Celexa. No headache today and hasn't taken Celexa yet today. Otherwise no issues. Explained side effects may cease as she continues taking medication and has tolerance. Patient wants to try taking medication for 2 more weeks and reassess at that time. - refill for Xanax 1mg  given.

## 2017-06-18 NOTE — Patient Instructions (Signed)
It was great to see you!  For your anxiety,  - We are giving you a refill of your Xanax - We are going to try Celexa 20mg  for 2 more weeks and see if your side effects resolve.  Take care and seek immediate care sooner if you develop any concerns.   Rory Percy, DO [facility]

## 2017-06-19 ENCOUNTER — Telehealth (HOSPITAL_COMMUNITY): Payer: Self-pay

## 2017-06-19 DIAGNOSIS — F429 Obsessive-compulsive disorder, unspecified: Secondary | ICD-10-CM | POA: Insufficient documentation

## 2017-06-19 NOTE — Assessment & Plan Note (Signed)
Patient reports not tolerating Celexa very well.  She had yet to take her dose today.  I get the sense this medication will be self-continued.  SSRIs will likely be a helpful part of treatment moving forward.  Will discuss with Dr. Tammi Klippel further and consult with Dr. Ky Barban.

## 2017-06-19 NOTE — Telephone Encounter (Signed)
Spoke with patient in regards to Cardiac Rehab. She would like [place] to call back at a later date. Will f/u in a couple of weeks.

## 2017-06-20 ENCOUNTER — Ambulatory Visit (HOSPITAL_BASED_OUTPATIENT_CLINIC_OR_DEPARTMENT_OTHER): Payer: Medicare Other | Admitting: Adult Health

## 2017-06-20 ENCOUNTER — Other Ambulatory Visit (HOSPITAL_BASED_OUTPATIENT_CLINIC_OR_DEPARTMENT_OTHER): Payer: Medicare Other

## 2017-06-20 ENCOUNTER — Telehealth: Payer: Self-pay | Admitting: Adult Health

## 2017-06-20 ENCOUNTER — Encounter: Payer: Self-pay | Admitting: Adult Health

## 2017-06-20 VITALS — BP 98/65 | HR 97 | Temp 98.1°F | Resp 20 | Ht 65.0 in | Wt 150.8 lb

## 2017-06-20 DIAGNOSIS — Z85038 Personal history of other malignant neoplasm of large intestine: Secondary | ICD-10-CM

## 2017-06-20 DIAGNOSIS — Z85820 Personal history of malignant melanoma of skin: Secondary | ICD-10-CM | POA: Diagnosis not present

## 2017-06-20 DIAGNOSIS — C3490 Malignant neoplasm of unspecified part of unspecified bronchus or lung: Secondary | ICD-10-CM

## 2017-06-20 DIAGNOSIS — Z85118 Personal history of other malignant neoplasm of bronchus and lung: Secondary | ICD-10-CM

## 2017-06-20 DIAGNOSIS — C3491 Malignant neoplasm of unspecified part of right bronchus or lung: Secondary | ICD-10-CM

## 2017-06-20 DIAGNOSIS — C4371 Malignant melanoma of right lower limb, including hip: Secondary | ICD-10-CM

## 2017-06-20 DIAGNOSIS — C189 Malignant neoplasm of colon, unspecified: Secondary | ICD-10-CM

## 2017-06-20 LAB — COMPREHENSIVE METABOLIC PANEL
ALT: 9 U/L (ref 0–55)
AST: 15 U/L (ref 5–34)
Albumin: 4.2 g/dL (ref 3.5–5.0)
Alkaline Phosphatase: 95 U/L (ref 40–150)
Anion Gap: 10 mEq/L (ref 3–11)
BILIRUBIN TOTAL: 1.04 mg/dL (ref 0.20–1.20)
BUN: 9.8 mg/dL (ref 7.0–26.0)
CALCIUM: 9.4 mg/dL (ref 8.4–10.4)
CHLORIDE: 103 meq/L (ref 98–109)
CO2: 26 mEq/L (ref 22–29)
CREATININE: 0.8 mg/dL (ref 0.6–1.1)
EGFR: >60 mL/min/{1.73_m2} (ref >60)
Glucose: 91 mg/dl (ref 70–140)
Potassium: 3.6 mEq/L (ref 3.5–5.1)
Sodium: 140 mEq/L (ref 136–145)
TOTAL PROTEIN: 8 g/dL (ref 6.4–8.3)

## 2017-06-20 LAB — CBC WITH DIFFERENTIAL/PLATELET
BASO%: 0.4 % (ref 0.0–2.0)
Basophils Absolute: 0 10*3/uL (ref 0.0–0.1)
EOS%: 1.6 % (ref 0.0–7.0)
Eosinophils Absolute: 0.1 10*3/uL (ref 0.0–0.5)
HEMATOCRIT: 38.3 % (ref 34.8–46.6)
HEMOGLOBIN: 13.3 g/dL (ref 11.6–15.9)
LYMPH#: 1.3 10*3/uL (ref 0.9–3.3)
LYMPH%: 16.7 % (ref 14.0–49.7)
MCH: 29.5 pg (ref 25.1–34.0)
MCHC: 34.7 g/dL (ref 31.5–36.0)
MCV: 84.9 fL (ref 79.5–101.0)
MONO#: 0.8 10*3/uL (ref 0.1–0.9)
MONO%: 10.6 % (ref 0.0–14.0)
NEUT%: 70.7 % (ref 38.4–76.8)
NEUTROS ABS: 5.4 10*3/uL (ref 1.5–6.5)
Platelets: 223 10*3/uL (ref 145–400)
RBC: 4.51 10*6/uL (ref 3.70–5.45)
RDW: 14.1 % (ref 11.2–14.5)
WBC: 7.6 10*3/uL (ref 3.9–10.3)

## 2017-06-20 NOTE — Telephone Encounter (Signed)
Scheduled appts per 10/19 los. Patient is aware of appts.

## 2017-06-20 NOTE — Progress Notes (Signed)
[name]  Telephone:(336) [phone] Fax:(336) [phone]     ID: [name] DOB: [birth year]  MR#: [id number]  DVV#:[id number]  Patient Care Team: Lori Percy, DO as PCP - General (Family Medicine) Lori Lever, MD (Pulmonary Disease) Lori Sine, MD as Consulting Physician (Dermatology) Armbruster, Lori Raspberry, MD as Consulting Physician (Gastroenterology) Magrinat, Lori Dad, MD as Consulting Physician (Oncology) PCP: Lori Percy, DO GYN: SU:  OTHER MD: [name] M.D., [name] M.D., Lori Skeans MD, Lori Sine MD  CHIEF COMPLAINT: malignant melanoma, colon carcinoma, lung cancer  CURRENT TREATMENT: Observation   HISTORY OF PRESENT ILLNESS:  From the prior summary:  I know [name] from her remote history of lung carcinoma, which has not recurred. More recently, her significant other [name] noticed a lesion in her right posterior calf. He keeps a picture [name] of melanoma lesions on his bathroom wall, and told her he was "99% sure" the lesion there would be a melanoma. The patient brought it to Dr. Marjean [name] attention and a punch biopsy was obtained 09/06/2014. This showed ([id number]) malignant melanoma, with a maximum thickness of 0.33 mm, anatomic level III, no ulceration, with negative deep margins but clear peripheral margins. The mitotic index was low, less than one per square millimeter. There was no evidence of lymphovascular invasion. There was brisk tumor infiltrating lymphocytes. Tumor regression was noted across the full aspect of the punch biopsy.   This was read as a pathologic stage TIa, and the patient was referred to Dr. Sarajane [name] for wide excision, performed 09/13/2014. Dr. Everett [name] note indicates a 1 cm margin around the initial biopsy site. The final pathology ([id number]) showed a tumor thickness of 1.15, anatomic level III, with ulceration seen only in the area of prior biopsy, (and therefore read as  negative). Peripheral as well as deep margins were free. Again the mitotic index was low there was no evidence of vascular invasion and tumor infiltrating lymphocytes were brisk. This was read as a pT2a lesion.   The patient is referred for further evaluation and treatment.  INTERVAL HISTORY: [name] returns today for follow-up of her remote lung cancer, and her more recent colon and melanoma cancers, as well as for routine health maintenance.  She did have CT scan in 2018 (related to shortness of breath).  No evidence of recurrence.  Mammogram in 12/2016 normal.  Colonoscopy in 12/2016, due again in 01/2020.  She sees Dr. Elvera Lennox a dermatologist every 6 months and is due again in February, 2019.    REVIEW OF SYSTEMS: [name] was diagnosed with heart failure this year and she had a stent placed and has since been placed on several different medications.  She has oxygen as needed due to her degree of heart failure.  Her last EF was 20-25% on 05/02/2017.  She has been doing stable since her last appointment and a detailed ROS is otherwise non contributory.    PAST MEDICAL HISTORY: Past Medical History:  Diagnosis Date  . Acute bilateral low back pain without sciatica   . Acute congestive heart failure (HCC)    EF 20-25% on ECHO 05/02/2017  . Acute respiratory failure with hypoxia ([place]) 05/01/2017  . 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 Allergic rhinitis   . Allergy   . Anxiety disorder   . Aortic valve regurgitation 05/15/2017  . Asthma   . Chronic bronchitis   . Colon cancer ([place])    adenocarcinoma in a polyps 11-24-2014  . COPD (chronic obstructive pulmonary disease) ([place])   .  Cough   . Dizziness and giddiness   . Dyspepsia   . Dyspnea 04/30/2017  . Emphysema of lung ([place])   . External hemorrhoids   . EXTERNAL HEMORRHOIDS 09/27/2009  . Female stress incontinence   . Genetic testing 08/18/2016   Negative for known pathogenic mutations within any of 25 genes on a Custom Panel through [employer].  One variant of uncertain significance (VUS) called "c.7187C>G (p.Thr2396Ser)" was found in one copy of the ATM gene.  This Custom Cancer Panel offered by [facility] includes sequencing and/or duplication/deletion testing of the following 25 genes: APC, ATM, AXIN2, BAP1, BMPR1A, BRCA1, BRCA2, CDH1, CDK4, CDKN2A, CHEK2, EPCAM, MITF, MLH1, MSH2, MSH6, MUTYH, PMS2, POLD1, POLE, PTEN, SCG5/GREM1, SMAD4, STK11, and TP53. Date of report is July 23, 2016.  MSH2 Exons 1-7 Inversion Analysis was also negative through [employer].  Date of report is July 23, 2016.     Marland Kitchen GERD (gastroesophageal reflux disease)    occ depending on diet   . Goiter   . History of lung cancer   . Hypercholesteremia    denies-last check normal labs  . Hypokalemia   . 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 Insomnia   . Lung cancer ([place])   . Migraine, unspecified, without mention of intractable migraine without mention of status migrainosus   . Panic attacks   . Seizures ([place])    very long ago like 15 years ago or so , questionable etilogy  . Skin cancer (melanoma) ([place])    right calf  . Snoring 12/11/2010   Longstanding hx of snore. NPSG was normal years ago.    Marland Kitchen Swelling of right lower extremity 12/01/2014    PAST SURGICAL HISTORY: Past Surgical History:  Procedure Laterality Date  . biopsy of right neck mass     negative-thyroid biopsy   . BREAST EXCISIONAL BIOPSY Right 1987   no visible scar  . BRONCHOSCOPY  01/31/2005   nonspecific inflammation  . BRONCHOSCOPY  08/24/2010   nonspecific inflammation  . COLONOSCOPY    . CORONARY STENT INTERVENTION N/A 05/12/2017   Procedure: CORONARY STENT INTERVENTION;  Surgeon: Charolette Forward, MD;  Location: [facility];  Service: Cardiovascular;  Laterality: N/A;  . LUNG REMOVAL, PARTIAL     for lung cancer--followed by Dr. Arlyce Dice and Dr. Annamaria Boots  . MELANOMA EXCISION WITH SENTINEL LYMPH NODE BIOPSY Right 11/29/2014    Procedure: RIGHT INGUINAL SENTINEL LYMPH NODE BIOPSY;  Surgeon: Lori Skeans, MD;  Location: [facility];  Service: General;  Laterality: Right;  . melanoma removal     right calf   . POLYPECTOMY    . portacath removed  10/2007  . RIGHT/LEFT HEART CATH AND CORONARY ANGIOGRAPHY N/A 05/06/2017   Procedure: RIGHT/LEFT HEART CATH AND CORONARY ANGIOGRAPHY;  Surgeon: [name], MD;  Location: [facility];  Service: Cardiovascular;  Laterality: N/A;  . SPIROMETRY  07/03/2002   min obstruction,mild restriction 01/31/2005  . tvh  12/01/2000   hysterectomy    FAMILY HISTORY Family History  Problem Relation Age of Onset  . Lung cancer Father        smoker and worked at a Academic librarian  . COPD Mother        not a smoker  . Other Mother        hx of hysterectomy for unspecified reason  . Asthma Daughter   . Diabetes Maternal Grandfather   . Colon polyps Brother  approx 16 polyps on his first colonoscopy  . Other Maternal Aunt        non-cancerous growth in lungs; respiratory issues; smoker  . COPD Maternal Grandmother        d. 67  . Cancer Paternal Grandfather        oral/mouth cancer; chewed tobacco; d. older age  . Throat cancer Other        maternal great aunt (MGF's sister); not a smoker  . Cirrhosis Paternal Uncle        hx of alcohol abuse   . Colon cancer Neg Hx   . Rectal cancer Neg Hx   . Stomach cancer Neg Hx    the patient's father died in his mid 37s from lung cancer. The patient's mother is living, in her 69s. The patient has one brother, no sisters. There is no other history of cancer in the family to her knowledge  GYNECOLOGIC HISTORY:  No LMP recorded. Patient has had a hysterectomy. Menarche age 61, first live birth age 32. The patient is GX P1. She had a hysterectomy approximately 2001. She did not undergo salpingo-oophorectomy. She did not take hormone replacement  SOCIAL HISTORY:  She is disabled and lives by herself, with a cat. Her significant other,  Aaron Edelman, works as a Chief Strategy Officer. Her daughter Theadora Rama, 65 years old as of March 2016, lives in [place] and in addition to working has 4 children. Brandy's husband works for an Tree surgeon, putting up signs   ADVANCED DIRECTIVES: Not in place. The patient intends to name her mother Worthy Flank as healthcare power of attorney. She can be reached at [phone]. At the 11/07/2014 visit patient received the appropriate documents 2 complete and notarize at her discretion   HEALTH MAINTENANCE: Social History  Substance Use Topics  . Smoking status: Former Smoker    Packs/day: 2.00    Years: 23.00    Types: Cigarettes    Start date: 09/02/1978    Quit date: 10/26/2001  . Smokeless tobacco: Never Used     Comment: Significant passive exposure from boy friend  . Alcohol use No     Comment: history of alcohol abuse 20-30    Mammography:  Colonoscopy:   PAP: Status post hysterectomy  Bone density:  Lipid panel:  No Known Allergies  Current Outpatient Prescriptions  Medication Sig Dispense Refill  . Albuterol Sulfate (PROAIR RESPICLICK) 270 (90 Base) MCG/ACT AEPB Inhale 2 puffs into the lungs every 6 (six) hours as needed. 3 each 3  . ALPRAZolam (XANAX) 1 MG tablet Take 1 tablet (1 mg total) by mouth 2 (two) times daily as needed for anxiety. 60 tablet 1  . aspirin 81 MG EC tablet Take 1 tablet (81 mg total) by mouth daily. 90 tablet 1  . atorvastatin (LIPITOR) 40 MG tablet Take 1 tablet (40 mg total) by mouth daily at 6 PM. 90 tablet 1  . carvedilol (COREG) 3.125 MG tablet Take 1 tablet (3.125 mg total) by mouth 2 (two) times daily with a meal. 180 tablet 2  . citalopram (CELEXA) 20 MG tablet TAKE 1 TABLET(20 MG) BY MOUTH DAILY 90 tablet 0  . clopidogrel (PLAVIX) 75 MG tablet Take 1 tablet (75 mg total) by mouth daily. 90 tablet 1  . digoxin (LANOXIN) 0.125 MG tablet Take 0.0625 mg by mouth daily.    . fluticasone furoate-vilanterol (BREO ELLIPTA) 100-25 MCG/INH AEPB Inhale 1 puff into  the lungs daily. 180 each 3  . furosemide (LASIX) 40 MG tablet TAKE  1 TABLET( 40 MG TOTAL) BY MOUTH DAILY AS NEEDED FOR INCREASED SWELLING OR WEIGH TGAIN MORE THAN 2 LB 90 tablet 2  . ipratropium-albuterol (DUONEB) 0.5-2.5 (3) MG/3ML SOLN Take 3 mLs by nebulization every 6 (six) hours as needed. 360 mL 1  . losartan (COZAAR) 25 MG tablet Take 1 tablet (25 mg total) by mouth daily. 90 tablet 1  . nitroGLYCERIN (NITROSTAT) 0.4 MG SL tablet Place 1 tablet (0.4 mg total) under the tongue every 5 (five) minutes as needed for chest pain. 30 tablet 12  . polyethylene glycol powder (GLYCOLAX/MIRALAX) powder Take 17 g by mouth 2 (two) times daily as needed. 225 g 5  . spironolactone (ALDACTONE) 25 MG tablet Take 1 tablet (25 mg total) by mouth daily. 90 tablet 3  . zolpidem (AMBIEN) 5 MG tablet 1 at bedtime for sleep as needed 30 tablet 5   No current facility-administered medications for this visit.     OBJECTIVE:   Vitals:   06/20/17 1311  BP: 98/65  Pulse: 97  Resp: 20  Temp: 98.1 F (36.7 C)  SpO2: 96%     Body mass index is 25.09 kg/m.    ECOG FS:1 - Symptomatic but completely ambulatory GENERAL: Patient is a well appearing female in no acute distress HEENT:  Sclerae anicteric. PERRL. Oropharynx clear and moist. No ulcerations or evidence of oropharyngeal candidiasis. Neck is supple.  NODES:  No cervical, supraclavicular, or axillary lymphadenopathy palpated.  LUNGS:  Clear to auscultation bilaterally.  No wheezes or rhonchi. HEART:  Regular rate and rhythm. No murmur appreciated. ABDOMEN:  Soft, nontender.  Positive, normoactive bowel sounds. No organomegaly palpated. MSK:  No focal spinal tenderness to palpation. Full range of motion bilaterally in the upper extremities. EXTREMITIES:  No peripheral edema.   SKIN:  Clear with no obvious rashes or skin changes. No nail dyscrasia. NEURO:  Nonfocal. Well oriented.  Appropriate affect.     LAB RESULTS:  CMP     Component Value  Date/Time   NA 139 06/16/2017 1522   NA 138 05/15/2017 1141   NA 141 12/11/2016 1106   K 3.4 (L) 06/16/2017 1522   K 3.5 12/11/2016 1106   CL 106 06/16/2017 1522   CL 111 (H) 07/09/2012 0958   CO2 25 06/16/2017 1522   CO2 22 12/11/2016 1106   GLUCOSE 92 06/16/2017 1522   GLUCOSE 84 12/11/2016 1106   GLUCOSE 80 07/09/2012 0958   BUN 8 06/16/2017 1522   BUN 14 05/15/2017 1141   BUN 8.1 12/11/2016 1106   CREATININE 0.68 06/16/2017 1522   CREATININE 0.8 12/11/2016 1106   CALCIUM 9.1 06/16/2017 1522   CALCIUM 9.0 12/11/2016 1106   PROT 7.5 12/11/2016 1106   ALBUMIN 3.9 12/11/2016 1106   AST 13 12/11/2016 1106   ALT 9 12/11/2016 1106   ALKPHOS 75 12/11/2016 1106   BILITOT 0.49 12/11/2016 1106   GFRNONAA >60 06/16/2017 1522   GFRAA >60 06/16/2017 1522    INo results found for: SPEP, UPEP  Lab Results  Component Value Date   WBC 7.6 06/20/2017   NEUTROABS 5.4 06/20/2017   HGB 13.3 06/20/2017   HCT 38.3 06/20/2017   MCV 84.9 06/20/2017   PLT 223 06/20/2017      Chemistry      Component Value Date/Time   NA 139 06/16/2017 1522   NA 138 05/15/2017 1141   NA 141 12/11/2016 1106   K 3.4 (L) 06/16/2017 1522   K 3.5 12/11/2016 1106  CL 106 06/16/2017 1522   CL 111 (H) 07/09/2012 0958   CO2 25 06/16/2017 1522   CO2 22 12/11/2016 1106   BUN 8 06/16/2017 1522   BUN 14 05/15/2017 1141   BUN 8.1 12/11/2016 1106   CREATININE 0.68 06/16/2017 1522   CREATININE 0.8 12/11/2016 1106      Component Value Date/Time   CALCIUM 9.1 06/16/2017 1522   CALCIUM 9.0 12/11/2016 1106   ALKPHOS 75 12/11/2016 1106   AST 13 12/11/2016 1106   ALT 9 12/11/2016 1106   BILITOT 0.49 12/11/2016 1106       Lab Results  Component Value Date   LABCA2 26 07/21/2007    No components found for: [id number]  No results for input(s): INR in the last 168 hours.  Urinalysis    Component Value Date/Time   LABSPEC 1.005 07/16/2012 1207   PHURINE 6.0 07/16/2012 1207   HGBUR Negative 07/16/2012  1207   BILIRUBINUR Negative 07/16/2012 1207   KETONESUR Negative 07/16/2012 1207   PROTEINUR Negative 07/16/2012 1207   NITRITE Negative 07/16/2012 1207   LEUKOCYTESUR Trace 07/16/2012 1207    STUDIES: No results found.  ASSESSMENT: 51 y.o. [place] woman  A: LUNG CANCER  (1) status post right upper lobe wedge resection of a T1 N2, stage IIIA non-small cell lung cancer (adenocarcinoma) measuring 1.8 cm, poorly differentiated, with negative margins, but with 4 positive lymph nodes, 3 N1 and 1 N2;   (a) on [place] 2004 to 2009  (b) no evidence of disease recurrence on most recent chest x-ray 08/19/2014, and likely cured  (2) ashthmatic bronchitis/ COPD  (3) tobacco abuse: The patient quit smoking 2001  B: MALIGNANT MELANOMA  (4) s/p punch biopsy Right posterior calf for malignant melanoma 0.33 mm thickness, level III, w/o ulceration and with a low mitotic index, free deep but positive lateral margins  (5) status post wide excision 09/13/2014 showing a maximum tumor thickness of 1.15 mm, and anatomic level III, with ulceration noted only at the site of prior biopsy, with clear margins( 1 cm per surgical note) with a low mitotic index and brisk infiltration by tumor infiltrating lymphocytes: pT2a NX MX = stage IB  (6) right inguinal sentinel lymph node biopsy 11/29/2014 showed no evidence of melanoma  C: COLON CANCER (7) colonoscopy 11/29/2014 shows adenocarcinoma in  ascending colon polyp; repeat colonoscopy 12/30/2014 removed a tubular adenoma in that area which was negative for malignancy. The area was marked for possible definitive surgery  (a) Repeat colonoscopy with polypectomy 12/30/2014 showed tubular adenomas.  D: GENETICS (8) Genetic testing 07/23/2016 through the [facility] Panel (with MSH2 Exons 1-7 Inversion Analysis) found no known deleterious mutations.in APC, ATM, AXIN2, BAP1, BMPR1A, BRCA1, BRCA2, CDH1, CDK4, CDKN2A, CHEK2, EPCAM, MITF, MLH1, MSH2, MSH6,  MUTYH, PMS2, POLD1, POLE, PTEN, SCG5/GREM1, SMAD4, STK11, and TP53.   (a) One variant of uncertain significance (VUS) called "c.7187C>G (p.Thr2396Ser)" was found in one copy of the ATM gene     PLAN: Dreamer is doing well.  She is up to date with her cancer screenings.  She wants to f/u with [place] in 1 year instead of 6 months.  She has kept up with her colonoscopies and screenings.  She will be due for chest CT per NCCN guidelines for next year.  She was recommended to continue to see dermatology regularly, along with GI.  She was counseled on healthy diet and avoiding a sedentary lifestyle (has EF of 25%).  She will return in 1 year.  She knows to call for any problems that may develop before that visit.  A total of (30) minutes of face-to-face time was spent with this patient with greater than 50% of that time in counseling and care-coordination.  Scot Dock, NP   06/20/2017 1:25 PM Medical Oncology and Hematology [facility] [address] [place], [place] [address] Tel. [phone]    Fax. [phone]

## 2017-06-26 ENCOUNTER — Ambulatory Visit (HOSPITAL_COMMUNITY)
Admission: RE | Admit: 2017-06-26 | Discharge: 2017-06-26 | Disposition: A | Discharge status: Home / Self Care | Payer: Medicare Other | Source: Ambulatory Visit | Attending: Internal Medicine | Admitting: Internal Medicine

## 2017-06-26 ENCOUNTER — Encounter (HOSPITAL_COMMUNITY): Payer: Self-pay

## 2017-06-26 DIAGNOSIS — I5022 Chronic systolic (congestive) heart failure: Secondary | ICD-10-CM | POA: Diagnosis present

## 2017-06-26 DIAGNOSIS — I38 Endocarditis, valve unspecified: Secondary | ICD-10-CM

## 2017-06-26 LAB — BASIC METABOLIC PANEL
ANION GAP: 8 (ref 5–15)
BUN: 9 mg/dL (ref 6–20)
CALCIUM: 9.1 mg/dL (ref 8.9–10.3)
CO2: 23 mmol/L (ref 22–32)
Chloride: 107 mmol/L (ref 101–111)
Creatinine, Ser: 0.9 mg/dL (ref 0.44–1.00)
GFR calc Af Amer: >60 mL/min (ref >60)
GLUCOSE: 93 mg/dL (ref 65–99)
Potassium: 4 mmol/L (ref 3.5–5.1)
Sodium: 138 mmol/L (ref 135–145)

## 2017-07-03 NOTE — Progress Notes (Signed)
   Subjective:   Patient ID: [name]    DOB: 05-Dec-1965, 51 y.o. female   MRN: [id number]  [name] is a 51 y.o. female with a history of anxiety, CHF, CAD, COPD here for   Anxiety - Medications: Xanax 1mg  BID PRN, Celexa 20mg  daily - Compliance: yes, but with headaches every day since starting Celexa - Taking Xanax BID every day - Current stressors: everything - Coping mechanisms: working on slowing breathing, walk - Denies medication side effects, SI/HI - feels like mind can't work. Doesn't have headache when doesn't take medication. - see GAD-7  L Side lump - unsure when first appeared - doesn't hurt except with pressing on it - Is concerned due to her h/o cancers - Denies fevers, no recent weight loss, no night sweats  Review of Systems:  Per HPI.   [place]: reviewed. Smoking status reviewed. Medications reviewed.  Objective:   BP (!) 90/58   Pulse 95   Temp 98 F (36.7 C) (Oral)   Wt 153 lb 12.8 oz (69.8 kg)   SpO2 95%   BMI 25.59 kg/m  Vitals and nursing note reviewed.  General: well nourished, well developed, in no acute distress with non-toxic appearance Neck: supple, non-tender without lymphadenopathy CV: regular rate and rhythm without murmurs, rubs, or gallops Lungs: clear to auscultation bilaterally with normal work of breathing Skin: warm, dry, no rashes. 2.5 x 58mm firm, mobile lump underneath the skin on L side of chest wall. No overlying erythema or rash. Extremities: warm and well perfused, normal tone MSK: Full ROM, strength intact, gait normal Neuro: Alert and oriented, speech normal.  PERRL, Extraocular movements intact. Hearing grossly intact bilaterally.  Tongue protrudes normally with no deviation.  Shoulder shrug, smile symmetric.   GAD 7 : Generalized Anxiety Score 07/04/2017 06/18/2017 06/04/2017 12/16/2016  Nervous, Anxious, on Edge 3 3 3  0  Control/stop worrying 3 3 3 3   Worry too much - different things 3 3 3 3   Trouble  relaxing 3 3 2  0  Restless 2 2 2  0  Easily annoyed or irritable 3 3 3  0  Afraid - awful might happen 1 3 1  0  Total GAD 7 Score 18 20 17 6   Anxiety Difficulty Somewhat difficult Very difficult Very difficult Not difficult at all    Assessment & Plan:   Generalized anxiety disorder GAD today 18. Continues to endorse constant headaches every day with use of Celexa. Neuro exam today wnl. Taking Xanax 1mg  BID. With persistent side effects from Celexa, will switch to small dose of Zoloft.  - Follow up in 2 weeks - Follow up with Dr. Gwenlyn Saran in 2 weeks - Zoloft 25mg  daily  Lump of skin Patient unsure of when she first noticed it. Pain with palpation. Exam reveals slightly firm, mobile lump measuring 2.3 x 3 mm, likely a lipoma. Will measure when she returns for anxiety f/u in 2 weeks. If still bothering patient, can consider surgery referral for removal.   No orders of the defined types were placed in this encounter.  Meds ordered this encounter  Medications  . sertraline (ZOLOFT) 25 MG tablet    Sig: Take 1 tablet (25 mg total) by mouth daily.    Dispense:  90 tablet    Refill:  0    Rory Percy, DO PGY-1, [facility] 07/04/2017 5:11 PM

## 2017-07-04 ENCOUNTER — Encounter: Payer: Self-pay | Admitting: Family Medicine

## 2017-07-04 ENCOUNTER — Ambulatory Visit (INDEPENDENT_AMBULATORY_CARE_PROVIDER_SITE_OTHER): Payer: Medicare Other | Admitting: Family Medicine

## 2017-07-04 VITALS — BP 90/58 | HR 95 | Temp 98.0°F | Wt 153.8 lb

## 2017-07-04 DIAGNOSIS — F411 Generalized anxiety disorder: Secondary | ICD-10-CM | POA: Diagnosis not present

## 2017-07-04 DIAGNOSIS — R229 Localized swelling, mass and lump, unspecified: Secondary | ICD-10-CM

## 2017-07-04 MED ORDER — SERTRALINE HCL 25 MG PO TABS: 25.0000 mg | ORAL_TABLET | Freq: Every day | ORAL | 0 refills | Status: DC

## 2017-07-04 NOTE — Patient Instructions (Addendum)
It was great to see you!  For your anxiety,  - We are going to try a different medication for your anxiety that will hopefully be a bit more tolerable. - Follow up in 2 weeks.  For the lump on your side, - We will measure it today and again when you come back for your medication check. - If it continues to bother you, we can place a referral to surgery to have it removed.  Take care and seek immediate care sooner if you develop any concerns.   Rory Percy, DO [facility]

## 2017-07-04 NOTE — Assessment & Plan Note (Signed)
GAD today 18. Continues to endorse constant headaches every day with use of Celexa. Neuro exam today wnl. Taking Xanax 1mg  BID. With persistent side effects from Celexa, will switch to small dose of Zoloft.  - Follow up in 2 weeks - Follow up with Dr. Gwenlyn Saran in 2 weeks - Zoloft 25mg  daily

## 2017-07-04 NOTE — Assessment & Plan Note (Signed)
Patient unsure of when she first noticed it. Pain with palpation. Exam reveals slightly firm, mobile lump measuring 2.3 x 3 mm, likely a lipoma. Will measure when she returns for anxiety f/u in 2 weeks. If still bothering patient, can consider surgery referral for removal.

## 2017-07-08 ENCOUNTER — Telehealth: Payer: Self-pay | Admitting: Psychology

## 2017-07-08 NOTE — Telephone Encounter (Signed)
Called patient to schedule a follow-up for [facility].  Discussed with Dr. Ky Barban who was okay with [facility] taking over medication management.  Noted recent change from Celexa (persistent headaches) to low dose Zoloft.    Also need to explore manualized or evidenced based treatments for OCD.  Patient is focused largely on medication as coping but states she is willing to try.  Scheduled for 11/21 at 9:00.

## 2017-07-10 NOTE — Telephone Encounter (Signed)
Attempted to call patient in regards to Cardiac Rehab - Lm on Vm °

## 2017-07-16 ENCOUNTER — Telehealth (HOSPITAL_COMMUNITY): Payer: Self-pay

## 2017-07-16 NOTE — Telephone Encounter (Signed)
[facility] appointment reminder call placed to patient for upcoming post-hospital follow up.  LVMTCB to confirm apt.  Patient also reminded to take all medications as prescribed on the day of his/her appointment and to bring all medications to this appointment.  Advised to call our office for tardiness or cancellations/rescheduling needs.  .Ahkeem Goede Genevea  

## 2017-07-17 ENCOUNTER — Encounter (HOSPITAL_COMMUNITY): Payer: Self-pay

## 2017-07-17 ENCOUNTER — Telehealth (HOSPITAL_COMMUNITY): Payer: Self-pay

## 2017-07-17 ENCOUNTER — Ambulatory Visit (HOSPITAL_COMMUNITY)
Admission: RE | Admit: 2017-07-17 | Discharge: 2017-07-17 | Disposition: A | Discharge status: Home / Self Care | Payer: Medicare Other | Source: Ambulatory Visit | Attending: Internal Medicine | Admitting: Internal Medicine

## 2017-07-17 VITALS — BP 110/68 | HR 98 | Wt 154.4 lb

## 2017-07-17 DIAGNOSIS — Z801 Family history of malignant neoplasm of trachea, bronchus and lung: Secondary | ICD-10-CM | POA: Diagnosis not present

## 2017-07-17 DIAGNOSIS — Z85038 Personal history of other malignant neoplasm of large intestine: Secondary | ICD-10-CM | POA: Diagnosis not present

## 2017-07-17 DIAGNOSIS — I2510 Atherosclerotic heart disease of native coronary artery without angina pectoris: Secondary | ICD-10-CM | POA: Diagnosis not present

## 2017-07-17 DIAGNOSIS — Z7902 Long term (current) use of antithrombotics/antiplatelets: Secondary | ICD-10-CM | POA: Diagnosis not present

## 2017-07-17 DIAGNOSIS — Z955 Presence of coronary angioplasty implant and graft: Secondary | ICD-10-CM | POA: Diagnosis not present

## 2017-07-17 DIAGNOSIS — Z833 Family history of diabetes mellitus: Secondary | ICD-10-CM | POA: Diagnosis not present

## 2017-07-17 DIAGNOSIS — Z85118 Personal history of other malignant neoplasm of bronchus and lung: Secondary | ICD-10-CM | POA: Insufficient documentation

## 2017-07-17 DIAGNOSIS — Z85820 Personal history of malignant melanoma of skin: Secondary | ICD-10-CM | POA: Diagnosis not present

## 2017-07-17 DIAGNOSIS — E049 Nontoxic goiter, unspecified: Secondary | ICD-10-CM | POA: Diagnosis not present

## 2017-07-17 DIAGNOSIS — Z87891 Personal history of nicotine dependence: Secondary | ICD-10-CM | POA: Diagnosis not present

## 2017-07-17 DIAGNOSIS — Z79899 Other long term (current) drug therapy: Secondary | ICD-10-CM | POA: Insufficient documentation

## 2017-07-17 DIAGNOSIS — Z7951 Long term (current) use of inhaled steroids: Secondary | ICD-10-CM | POA: Insufficient documentation

## 2017-07-17 DIAGNOSIS — Z902 Acquired absence of lung [part of]: Secondary | ICD-10-CM | POA: Diagnosis not present

## 2017-07-17 DIAGNOSIS — Z9221 Personal history of antineoplastic chemotherapy: Secondary | ICD-10-CM | POA: Insufficient documentation

## 2017-07-17 DIAGNOSIS — Z825 Family history of asthma and other chronic lower respiratory diseases: Secondary | ICD-10-CM | POA: Insufficient documentation

## 2017-07-17 DIAGNOSIS — I5022 Chronic systolic (congestive) heart failure: Secondary | ICD-10-CM | POA: Diagnosis not present

## 2017-07-17 DIAGNOSIS — Z8371 Family history of colonic polyps: Secondary | ICD-10-CM | POA: Diagnosis not present

## 2017-07-17 DIAGNOSIS — I429 Cardiomyopathy, unspecified: Secondary | ICD-10-CM | POA: Insufficient documentation

## 2017-07-17 DIAGNOSIS — Z7982 Long term (current) use of aspirin: Secondary | ICD-10-CM | POA: Insufficient documentation

## 2017-07-17 DIAGNOSIS — Z923 Personal history of irradiation: Secondary | ICD-10-CM | POA: Diagnosis not present

## 2017-07-17 DIAGNOSIS — J449 Chronic obstructive pulmonary disease, unspecified: Secondary | ICD-10-CM

## 2017-07-17 LAB — BASIC METABOLIC PANEL
Anion gap: 5 (ref 5–15)
BUN: 8 mg/dL (ref 6–20)
CHLORIDE: 109 mmol/L (ref 101–111)
CO2: 24 mmol/L (ref 22–32)
CREATININE: 0.79 mg/dL (ref 0.44–1.00)
Calcium: 8.9 mg/dL (ref 8.9–10.3)
GFR calc Af Amer: >60 mL/min (ref >60)
GLUCOSE: 86 mg/dL (ref 65–99)
POTASSIUM: 3.5 mmol/L (ref 3.5–5.1)
SODIUM: 138 mmol/L (ref 135–145)

## 2017-07-17 LAB — DIGOXIN LEVEL: DIGOXIN LVL: 0.7 ng/mL — AB (ref 0.8–2.0)

## 2017-07-17 MED ORDER — CARVEDILOL 6.25 MG PO TABS: 6.2500 mg | ORAL_TABLET | Freq: Two times a day (BID) | ORAL | 3 refills | Status: DC

## 2017-07-17 NOTE — Telephone Encounter (Signed)
Called and spoke with patient in regards to Cardiac Rehab - Patient spoke with her Dr today and she stated she cannot afford this program. I offered the Maintenance program and she denied that as well. Her Dr mentioned the silver sneaker program to patient. Closing referral.

## 2017-07-17 NOTE — Patient Instructions (Signed)
INCREASE Coreg to 6.25 mg, one tab twice a day  Labs today We will only contact you if something comes back abnormal or we need to make some changes. Otherwise no news is good news!  You have been referred to [place] with the [facility], your referral has been placed and someone will be in contact with you.  Your physician recommends that you schedule a follow-up appointment in: 6 weeks with Rebecca Eaton  Your physician recommends that you schedule a follow-up appointment in: 3 months with Dr Aundra Dubin  Your physician recommends that you schedule a echocardiogram in one month  Your physician has requested that you have an echocardiogram. Echocardiography is a painless test that uses sound waves to create images of your heart. It provides your doctor with information about the size and shape of your heart and how well your heart's chambers and valves are working. This procedure takes approximately one hour. There are no restrictions for this procedure.   Do the following things EVERYDAY: 1) Weigh yourself in the morning before breakfast. Write it down and keep it in a log. 2) Take your medicines as prescribed 3) Eat low salt foods-Limit salt (sodium) to 2000 mg per day.  4) Stay as active as you can everyday 5) Limit all fluids for the day to less than 2 liters

## 2017-07-17 NOTE — Progress Notes (Signed)
PCP: Dr. McDiarmid HF Cardiology: Bensimhon  51 yo with history of CAD and cardiomyopathy probably out of proportion to CAD presents for followup of CHF.  Patient has a history of COPD, no longer smokes.   She was hospitalized in 9/18 with worsening dyspnea.  Echo showed EF 20-25%.  Cath showed 70% ostial left main stenosis.  PFTs were suggestive of severe COPD.  She was evaluated for CABG but this was considered too high risk.  Instead, she had PCI to ostial left main with DES.  She was also diuresed.    Pt presents today for regular follow up. At last visit Arlyce Harman was increased. Feeling great overall.  Having some numbness and tingling in her left leg. No problems with movement. Taking lasix 1-2 times weekly for fluid.   SOB with hillls or stairs, but otherwise fine on flat ground. No SOB with ADLs. She has mild lightheadedness with rapid standing, especially after bending over. No orthopnea or PND. Denies CP.   Labs (9/18): digoxin 1.1, K 4.4, creatinine 0.83  PMH: 1. COPD: Quit smoking 2003.  PFTs (9/18) with severe obstruction.  2. Chronic systolic CHF: ?Mixed cardiomyopathy (out of proportion to degree of CAD).  - Echo (8/18): EF 20-25%, inferior/inferoseptal akinesis, mildly dilated RV with moderately decreased systolic function, mild-moderate AI, mild MR.  - RHC (9/18): mean RA 5, PA 44/22, mean PCWP 7, CI 1.62 Fick/2.21 thermodilution.  3. CAD: LHC (9/18) with 70% ostial left main.  DES to ostial LM.  4. Non-small cell lung CA: 2003.  She had right upper lobectomy, chemotherapy (Iressa), and radiation.  5. H/o colon cancer: resection 2016.  6. H/o melanoma right leg, s/p resection.   Social History   Socioeconomic History  . Marital status: Widowed    Spouse name: Not on file  . Number of children: 1  . Years of education: 76  . Highest education level: Not on file  Social Needs  . Financial resource strain: Not on file  . Food insecurity - worry: Not on file  . Food  insecurity - inability: Not on file  . Transportation needs - medical: Not on file  . Transportation needs - non-medical: Not on file  Occupational History  . Occupation: Disabled- Cytogeneticist: UNEMPLOYED  Tobacco Use  . Smoking status: Former Smoker    Packs/day: 2.00    Years: 23.00    Pack years: 46.00    Types: Cigarettes    Start date: 09/02/1978    Last attempt to quit: 10/26/2001    Years since quitting: 15.7  . Smokeless tobacco: Never Used  . Tobacco comment: Significant passive exposure from boy friend  Substance and Sexual Activity  . Alcohol use: No    Alcohol/week: 0.0 oz    Comment: history of alcohol abuse 20-30  . Drug use: No    Comment: +MJ  . Sexual activity: Not on file  Other Topics Concern  . Not on file  Social History Narrative   Health Care POA:    Emergency Contact:    End of Life Plan:    Who lives with you: self   Any pets: cat, Bart   Diet: Pt has a varied diet but reports not eating much because she is afraid of weight gain.   Exercise: Pt has no regular exercise routine.   Seatbelts: Pt reports wearing seatbelt when in vehicles.    Hobbies: movies         Family History  Problem  Relation Age of Onset  . Lung cancer Father        smoker and worked at a Academic librarian  . COPD Mother        not a smoker  . Other Mother        hx of hysterectomy for unspecified reason  . Asthma Daughter   . Diabetes Maternal Grandfather   . Colon polyps Brother        approx 16 polyps on his first colonoscopy  . Other Maternal Aunt        non-cancerous growth in lungs; respiratory issues; smoker  . COPD Maternal Grandmother        d. 71  . Cancer Paternal Grandfather        oral/mouth cancer; chewed tobacco; d. older age  . Throat cancer Other        maternal great aunt (MGF's sister); not a smoker  . Cirrhosis Paternal Uncle        hx of alcohol abuse   . Colon cancer Neg Hx   . Rectal cancer Neg Hx   . Stomach cancer Neg Hx     Review of systems complete and found to be negative unless listed in HPI.    Current Outpatient Medications  Medication Sig Dispense Refill  . Albuterol Sulfate (PROAIR RESPICLICK) 073 (90 Base) MCG/ACT AEPB Inhale 2 puffs into the lungs every 6 (six) hours as needed. 3 each 3  . ALPRAZolam (XANAX) 1 MG tablet Take 1 tablet (1 mg total) by mouth 2 (two) times daily as needed for anxiety. 60 tablet 1  . aspirin 81 MG EC tablet Take 1 tablet (81 mg total) by mouth daily. 90 tablet 1  . atorvastatin (LIPITOR) 40 MG tablet Take 1 tablet (40 mg total) by mouth daily at 6 PM. 90 tablet 1  . carvedilol (COREG) 3.125 MG tablet Take 1 tablet (3.125 mg total) by mouth 2 (two) times daily with a meal. 180 tablet 2  . clopidogrel (PLAVIX) 75 MG tablet Take 1 tablet (75 mg total) by mouth daily. 90 tablet 1  . digoxin (LANOXIN) 0.125 MG tablet Take 0.0625 mg by mouth daily.    . fluticasone furoate-vilanterol (BREO ELLIPTA) 100-25 MCG/INH AEPB Inhale 1 puff into the lungs daily. 180 each 3  . furosemide (LASIX) 40 MG tablet TAKE 1 TABLET( 40 MG TOTAL) BY MOUTH DAILY AS NEEDED FOR INCREASED SWELLING OR WEIGH TGAIN MORE THAN 2 LB 90 tablet 2  . ipratropium-albuterol (DUONEB) 0.5-2.5 (3) MG/3ML SOLN Take 3 mLs by nebulization every 6 (six) hours as needed. 360 mL 1  . losartan (COZAAR) 25 MG tablet Take 1 tablet (25 mg total) by mouth daily. 90 tablet 1  . nitroGLYCERIN (NITROSTAT) 0.4 MG SL tablet Place 1 tablet (0.4 mg total) under the tongue every 5 (five) minutes as needed for chest pain. 30 tablet 12  . polyethylene glycol powder (GLYCOLAX/MIRALAX) powder Take 17 g by mouth 2 (two) times daily as needed. 225 g 5  . sertraline (ZOLOFT) 25 MG tablet Take 1 tablet (25 mg total) by mouth daily. 90 tablet 0  . spironolactone (ALDACTONE) 25 MG tablet Take 1 tablet (25 mg total) by mouth daily. 90 tablet 3  . zolpidem (AMBIEN) 5 MG tablet 1 at bedtime for sleep as needed 30 tablet 5   No current  facility-administered medications for this encounter.    Vitals:   07/17/17 1059  BP: 110/68  Pulse: 98  SpO2: 93%  Weight: 154 lb  6.4 oz (70 kg)   Wt Readings from Last 3 Encounters:  07/17/17 154 lb 6.4 oz (70 kg)  07/04/17 153 lb 12.8 oz (69.8 kg)  06/20/17 150 lb 12.8 oz (68.4 kg)   General: NAD Neck: No JVD, goiter noted.  Lungs: Clear to auscultation bilaterally with normal respiratory effort. CV: Nondisplaced PMI.  Heart regular S1/S2, no S3/S4, no murmur.  No peripheral edema.  No carotid bruit.  Normal pedal pulses.  Abdomen: Soft, nontender, no hepatosplenomegaly, no distention.  Skin: Intact without lesions or rashes.  Neurologic: Alert and oriented x 3.  Psych: Normal affect. Extremities: No clubbing or cyanosis.  HEENT: Normal.   Assessment/Plan: 1. Chronic systolic CHF: Echo 9/50 with EF 20-25%.  She has significant coronary disease, but I think that the degree of LV dysfunction is out of proportion to CAD.  Suspect mixed versus nonischemic cardiomyopathy. - NYHA class II symptoms - Volume status stable on 40 mg lasix as needed only.  - Increase coreg to 6.25 mg with HR still in 90s.  - Continue spiro 25 mg daily.  - Continue losartan 25 mg daily for now.  - Continue lasix 40 mg as needed.  - She will need repeat echo for ICD consideration in 12/18. QRS is too short for benefit from CRT. Will order. - Continue digoxin 0.0625 mg daily. Check level today.  - She cannot afford cardiac rehab. Will refer to [facility].  2. CAD: s/p DES to LM.  No chest pain.   - Continue ASA 81, Plavix 75. Will likely continue Plavix long-term.  - Continue atorvastatin 40 daily. Lipids stable 06/2017. 3. Goiter:  - TSH stable at last visit.  4. COPD: Severe by 9/18 PFTs.  No longer smoking.  Follows with Dr. Annamaria Boots.   Echo next month. RTC 6 weeks for follow up. Plan to RTC 3 months to see MD.   Shirley Friar, PA-C  07/17/2017  Greater than 50% of the 25 minute visit  was spent in counseling/coordination of care regarding disease state education, salt/fluid restriction, sliding scale diuretics, and medication compliance.

## 2017-07-19 NOTE — Progress Notes (Signed)
   Subjective:   Patient ID: [name]    DOB: March 15, 1966, 51 y.o. female   MRN: [id number]  [name] is a 51 y.o. female with a history of anxiety, CHF, COPD here for   Anxiety - Medications: Xanax 1mg  BID, Zoloft 25mg  (switched from Celexa due to headaches) - states she feels Zoloft is helping, it "levels her", but states it does tend to wear off in the evening. Feels sad when it wears off.  - Taking: Typically takes Xanax twice daily, only took once daily two evenings in the last 2 weeks. - No problems with taking zoloft. - Current stressors: everything, depends on "the way her mind is at the time [potential stressor] happens", daughter is in the hospital with her granddaughter for eating problems (FTT) - Coping Mechanisms: Xanax, breathing exercises helps some but not really - Next appointment with [facility]: 07/23/17  Review of Systems:  Per HPI.   [place]: reviewed. Smoking status reviewed. Medications reviewed.  Objective:   BP 110/60   Pulse 99   Temp 97.8 F (36.6 C) (Oral)   Ht 5\' 5"  (1.651 m)   Wt 157 lb (71.2 kg)   SpO2 96%   BMI 26.13 kg/m  Vitals and nursing note reviewed.  General: well nourished, well developed, in no acute distress with non-toxic appearance CV: regular rate and rhythm without murmurs, rubs, or gallops, no lower extremity edema Lungs: clear to auscultation bilaterally with normal work of breathing Skin: warm, dry, no rashes or lesions Extremities: warm and well perfused, normal tone MSK: Full ROM, strength intact, gait normal Neuro: Alert and oriented, speech normal  Assessment & Plan:   Generalized anxiety disorder Patient switched from Celexa to Zoloft 25mg  at last visit due to side effects. Tolerating zoloft well with better anxiety control and needing less Xanax although feels it wears off in the evenings. Following with [facility] with next appointment with Dr. Gwenlyn Saran on 11/21. Denies SI/HI. - Increase Zoloft to  50mg  - Keep appointment with Dr. Gwenlyn Saran - F/u in 4 weeks.  No orders of the defined types were placed in this encounter.  Rory Percy, DO PGY-1, [facility] 07/21/2017 3:50 PM

## 2017-07-21 ENCOUNTER — Ambulatory Visit (INDEPENDENT_AMBULATORY_CARE_PROVIDER_SITE_OTHER): Payer: Medicare Other | Admitting: Family Medicine

## 2017-07-21 ENCOUNTER — Encounter: Payer: Self-pay | Admitting: Family Medicine

## 2017-07-21 ENCOUNTER — Other Ambulatory Visit: Payer: Self-pay

## 2017-07-21 DIAGNOSIS — F411 Generalized anxiety disorder: Secondary | ICD-10-CM | POA: Diagnosis not present

## 2017-07-21 MED ORDER — SERTRALINE HCL 50 MG PO TABS: 50.0000 mg | ORAL_TABLET | Freq: Every day | ORAL | 2 refills | Status: DC

## 2017-07-21 NOTE — Patient Instructions (Signed)
It was great to see you!  I am so glad you are starting to feel better!!  For your anixety,  - We are increasing your Zoloft to 50mg  daily. - Follow up with Dr. Gwenlyn Saran on 11/21. - Follow up with Dr. Ky Barban (me) in 4 weeks.   Take care and seek immediate care sooner if you develop any concerns.   Rory Percy, DO [facility]

## 2017-07-21 NOTE — Assessment & Plan Note (Signed)
Patient switched from Celexa to Zoloft 25mg  at last visit due to side effects. Tolerating zoloft well with better anxiety control and needing less Xanax although feels it wears off in the evenings. Following with [facility] with next appointment with Dr. Gwenlyn Saran on 11/21. Denies SI/HI. - Increase Zoloft to 50mg  - Keep appointment with Dr. Gwenlyn Saran - F/u in 4 weeks.

## 2017-07-22 ENCOUNTER — Other Ambulatory Visit (HOSPITAL_COMMUNITY): Payer: Self-pay | Admitting: Cardiology

## 2017-07-22 DIAGNOSIS — I5021 Acute systolic (congestive) heart failure: Secondary | ICD-10-CM

## 2017-07-23 ENCOUNTER — Ambulatory Visit (INDEPENDENT_AMBULATORY_CARE_PROVIDER_SITE_OTHER): Payer: Medicare Other | Admitting: Psychology

## 2017-07-23 ENCOUNTER — Telehealth: Payer: Self-pay | Admitting: *Deleted

## 2017-07-23 DIAGNOSIS — F429 Obsessive-compulsive disorder, unspecified: Secondary | ICD-10-CM | POA: Diagnosis not present

## 2017-07-23 NOTE — Assessment & Plan Note (Addendum)
Continues to manifest compulsions during our visit.  She straightened several things in the office and used her hand sanitizer twice in a 30 minute period.  Insight seems to be on the low side making me wonder how much she might benefit from therapy.  Additionally, she says the OCD doesn't bother her all that much which may mean she won't have the requisite motivation to benefit from therapy.  Dr. Tammi Klippel explored tolerability, safety, and efficacy of Xanax and at present, this seems like a reasonable medication to treat aspects of [name]'s anxiety.  It is not indicated for OCD and thus, the Zoloft is considered a helpful addition.  Will keep at current dose for at least two more weeks.  Target dose is likely 150 to 200 mg however moving slowly toward that goal is necessary.    See patient instructions for further plan.    Patient reported she did not need refills at this time.

## 2017-07-23 NOTE — Telephone Encounter (Signed)
Patient states MD was supposed to send a higher dosage of zoloft to her pharmacy at last visit but pharmacy still hasnt received it.

## 2017-07-23 NOTE — Progress Notes (Signed)
Reason for follow-up:  Continued management of anxiety, specifically OCD and GAD.  Issues discussed:  Dry skin:  Washes hands frequently and uses hand sanitizer in between. Hands are extremely dry.  Holidays:  Hates the holidays.  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  She does like the food associated with the holidays.  Medication:  Tolerating 50 mg of Zoloft and thinks it might be helping a bit.  Four days this past week she took one Xanax.  Always takes one in the morning around 10:00 or 11:00.  Takes one at night sometimes.  Less so this past week.

## 2017-07-23 NOTE — Patient Instructions (Addendum)
Please schedule a follow-up for:  December 5th at 9:00.  Please call me at [phone].  Dr. Tammi Klippel and I will be managing your medicines for your anxiety, at least in the short-term.  Keep everything for the same for now.  It is hopeful that you are feeling well on the Zoloft.  Getting therapy specifically for OCD is recommended.  This type of therapy takes effort and a desire or motivation.  If you are interested in this, we would recommend the [facility] clinic.  [phone].

## 2017-07-29 NOTE — Telephone Encounter (Signed)
Spoke with patient, was able to pick up correct dose at pharmacy.  Rory Percy, DO PGY-1, [facility] 07/29/2017 4:03 PM

## 2017-07-30 ENCOUNTER — Telehealth: Payer: Self-pay

## 2017-07-30 NOTE — Telephone Encounter (Signed)
Spoke w/Ms. Mille about the PREP and she is interested.  Will seek payment through [facility] for her to participate in program at no cost to her.  She will come in to register on Friday 08/01/17 at 9:30am.

## 2017-08-01 NOTE — Progress Notes (Signed)
[facility]   Patient Details  Name: [name] MRN: [id number] Date of Birth: Feb 08, 1966 Age: 51 y.o. PCP: Rory Percy, DO  Vitals:   08/01/17 1319  BP: 110/70  Pulse: 80  Resp: 18  SpO2: 96%  Weight: 154 lb 6.4 oz (70 kg)     [facility] [facility] Eval - 08/01/17 1300      Referral    Referring Provider  Aundra Dubin    Reason for referral  Heart Failure;Hypertension;Inactivity;Other Heart failure   Heart failure   Program Start Date  08/01/17      Measurement   Waist Circumference  34 inches    Hip Circumference  39.5 inches    Body fat  34.9 percent      Information for Trainer   Goals  "heart health, build strength & endurance, lose some weight"    Current Exercise  cleaning house    Orthopedic Concerns  Bilat knees, LT shoulder    Pertinent Medical History  HTN, COPD, CHF, tachycardia, shourness of breath, Lung CA, Colon CA, melanoma    Current Barriers  none    Medications that affect exercise  Oxgen;Asthma inhaler      Timed Up and Go (TUGS)   Timed Up and Go  Low risk <9 seconds      Mobility and Daily Activities   I find it easy to walk up or down two or more flights of stairs.  1    I have no trouble taking out the trash.  1    I do housework such as vacuuming and dusting on my own without difficulty.  4    I can easily lift a gallon of milk (8lbs).  4    I can easily walk a mile.  1    I have no trouble reaching into high cupboards or reaching down to pick up something from the floor.  2    I do not have trouble doing out-door work such as Armed forces logistics/support/administrative officer, raking leaves, or gardening.  1      Mobility and Daily Activities   I feel younger than my age.  2    I feel independent.  4    I feel energetic.  1    I live an active life.   1    I feel strong.  1    I feel healthy.  1    I feel active as other people my age.  1      How fit and strong are you.   Fit and Strong Total Score  25      Past Medical History:  Diagnosis  Date  . Acute bilateral low back pain without sciatica   . Acute congestive heart failure (HCC)    EF 20-25% on ECHO 05/02/2017  . Acute respiratory failure with hypoxia ([place]) 05/01/2017  . 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 Allergic rhinitis   . Allergy   . Anxiety disorder   . Aortic valve regurgitation 05/15/2017  . Asthma   . Chronic bronchitis   . Colon cancer ([place])    adenocarcinoma in a polyps 11-24-2014  . COPD (chronic obstructive pulmonary disease) ([place])   . Cough   . Dizziness and giddiness   . Dyspepsia   . Dyspnea 04/30/2017  . Emphysema of lung ([place])   . External hemorrhoids   . EXTERNAL HEMORRHOIDS 09/27/2009  . Female stress incontinence   . Genetic testing 08/18/2016   Negative for known  pathogenic mutations within any of 25 genes on a Custom Panel through [employer].  One variant of uncertain significance (VUS) called "c.7187C>G (p.Thr2396Ser)" was found in one copy of the ATM gene.  This Custom Cancer Panel offered by [employer] includes sequencing and/or duplication/deletion testing of the following 25 genes: APC, ATM, AXIN2, BAP1, BMPR1A, BRCA1, BRCA2, CDH1, CDK4, CDKN2A, CHEK2, EPCAM, MITF, MLH1, MSH2, MSH6, MUTYH, PMS2, POLD1, POLE, PTEN, SCG5/GREM1, SMAD4, STK11, and TP53. Date of report is July 23, 2016.  MSH2 Exons 1-7 Inversion Analysis was also negative through [employer].  Date of report is July 23, 2016.     Marland Kitchen GERD (gastroesophageal reflux disease)    occ depending on diet   . Goiter   . History of lung cancer   . Hypercholesteremia    denies-last check normal labs  . Hypokalemia   . 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 Insomnia   . Lung cancer ([place])   . Migraine, unspecified, without mention of intractable migraine without mention of status migrainosus   . Panic attacks   . Seizures ([place])    very long ago like 15 years ago or so , questionable etilogy  . Skin cancer (melanoma) ([place])    right calf  . Snoring  12/11/2010   Longstanding hx of snore. NPSG was normal years ago.    Marland Kitchen Swelling of right lower extremity 12/01/2014   Past Surgical History:  Procedure Laterality Date  . biopsy of right neck mass     negative-thyroid biopsy   . BREAST EXCISIONAL BIOPSY Right 1987   no visible scar  . BRONCHOSCOPY  01/31/2005   nonspecific inflammation  . BRONCHOSCOPY  08/24/2010   nonspecific inflammation  . COLONOSCOPY    . CORONARY STENT INTERVENTION N/A 05/12/2017   Procedure: CORONARY STENT INTERVENTION;  Surgeon: Charolette Forward, MD;  Location: [facility];  Service: Cardiovascular;  Laterality: N/A;  . LUNG REMOVAL, PARTIAL     for lung cancer--followed by Dr. Arlyce Dice and Dr. Annamaria Boots  . MELANOMA EXCISION WITH SENTINEL LYMPH NODE BIOPSY Right 11/29/2014   Procedure: RIGHT INGUINAL SENTINEL LYMPH NODE BIOPSY;  Surgeon: Georganna Skeans, MD;  Location: [facility];  Service: General;  Laterality: Right;  . melanoma removal     right calf   . POLYPECTOMY    . portacath removed  10/2007  . RIGHT/LEFT HEART CATH AND CORONARY ANGIOGRAPHY N/A 05/06/2017   Procedure: RIGHT/LEFT HEART CATH AND CORONARY ANGIOGRAPHY;  Surgeon: [name], MD;  Location: [facility];  Service: Cardiovascular;  Laterality: N/A;  . SPIROMETRY  07/03/2002   min obstruction,mild restriction 01/31/2005  . tvh  12/01/2000   hysterectomy   Social History   Tobacco Use  Smoking Status Former Smoker  . Packs/day: 2.00  . Years: 23.00  . Pack years: 46.00  . Types: Cigarettes  . Start date: 09/02/1978  . Last attempt to quit: 10/26/2001  . Years since quitting: 15.7  Smokeless Tobacco Never Used  Tobacco Comment   Significant passive exposure from boy friend     [name] has registered for the PREP today and has been oriented to the [employer].  She has been given a schedule of classes and advised on which classes are appropriate for her at this time and will be getting a call from a personal trainer in the near  future.    [name] 08/01/2017, 1:23 PM

## 2017-08-06 ENCOUNTER — Ambulatory Visit (INDEPENDENT_AMBULATORY_CARE_PROVIDER_SITE_OTHER): Payer: Medicare Other | Admitting: Psychology

## 2017-08-06 DIAGNOSIS — F411 Generalized anxiety disorder: Secondary | ICD-10-CM | POA: Diagnosis not present

## 2017-08-06 DIAGNOSIS — F422 Mixed obsessional thoughts and acts: Secondary | ICD-10-CM | POA: Diagnosis not present

## 2017-08-06 NOTE — Assessment & Plan Note (Signed)
Improved on 50 mg of Zoloft.  Patient was reluctant to increase medicine fearing that she might get worse.  Dr. Tammi Klippel provided some information and she agreed that increasing to 100 mg makes sense.  She can go back to a lower dose if this does not prove helpful.    Refilled Xanax script.  She has 32 pills left.  She is using one a day in the morning and not using them in the evening very frequently.

## 2017-08-06 NOTE — Assessment & Plan Note (Signed)
Seems to be exhibiting a more optimistic cognitive framework.  Looking forward to the holidays.  Excited about the gym.  See assessment under OCD diagnosis.

## 2017-08-06 NOTE — Progress Notes (Signed)
Reason for follow-up:  Continued management of mood and anxiety.    Issues discussed:    Medication / Mood / OCD:  Feeling better on 50 mg of Zoloft.  Is proud she is taking less Xanax than usual.  Tolerating the medicine fine.  Thinks her mood is better.  Bothersome depressive symptoms are decreased.  Anxiety remains higher.  Most bothersome symptoms include being fidgety and having to "move things" around the house (part of her OCD).  Gym:  Heart doctor says she can start at the gym.  She is excited about this.  Wants to go two days a week (has to start slow with "old people exercises").  Also plans on attending some education classes there.

## 2017-08-06 NOTE — Patient Instructions (Signed)
Please schedule a follow-up for January 16th at 9:00.  Dr. Tammi Klippel recommended you increase your Zoloft dose to 100 mg.  You have had a good response at 50 mg and we are very hopeful that you will get even better relief at 100 mg.  Please call me if you have any questions:  [phone].  Starting at the gym is one of the best things you can do for your health.  This is fantastic and I am excited for you.    Dr. Tammi Klippel wrote you a new script for Alprazolam.  Great job taking less of this medicine.  You may notice taking even less Xanax with the increased dose of the zoloft.

## 2017-08-08 NOTE — Progress Notes (Signed)
[facility] [employer] PREP Weekly Session   Patient Details  Name: [name] MRN: [id number] Date of Birth: 1965-11-15 Age: 51 y.o. PCP: Rory Percy, DO  Vitals:   08/06/17 1214  Weight: 153 lb 6.4 oz (69.6 kg)    [employer] Weekly seesion - 08/08/17 1200      Weekly Session   Topic Discussed  Hitting roadblocks    Minutes exercised this week  0 minutes hasn't started HYW:VPXT'G gotten a trainer assigned yet   hasn't started GYI:RSWN'I gotten a trainer assigned yet   Classes attended to date  1      Fun things you did since last meeting:"babysat my grandkids" Things you are grateful for:"life (being alive" Barriers:"drink to many soda"  Vanita Ingles 08/08/2017, 12:15 PM

## 2017-08-15 ENCOUNTER — Ambulatory Visit (HOSPITAL_COMMUNITY)
Admission: RE | Admit: 2017-08-15 | Discharge: 2017-08-15 | Disposition: A | Discharge status: Home / Self Care | Payer: Medicare Other | Source: Ambulatory Visit | Attending: Cardiology | Admitting: Cardiology

## 2017-08-15 ENCOUNTER — Telehealth (HOSPITAL_COMMUNITY): Payer: Self-pay

## 2017-08-15 DIAGNOSIS — I5022 Chronic systolic (congestive) heart failure: Secondary | ICD-10-CM | POA: Diagnosis not present

## 2017-08-15 DIAGNOSIS — J449 Chronic obstructive pulmonary disease, unspecified: Secondary | ICD-10-CM | POA: Diagnosis not present

## 2017-08-15 DIAGNOSIS — I351 Nonrheumatic aortic (valve) insufficiency: Secondary | ICD-10-CM | POA: Diagnosis not present

## 2017-08-15 NOTE — Telephone Encounter (Signed)
Result Notes for ECHOCARDIOGRAM COMPLETE   Notes recorded by Effie Berkshire, RN on 08/15/2017 at 12:57 PM EST Patient aware and appreciative, advised to continue medications as prescribed and fluid and sodium intake restrictions, reminded of upcoming apt 1.2.19 ------  Notes recorded by Shirley Friar, PA-C on 08/15/2017 at 12:12 PM EST Please let her know her EF has recovered to normal!!   [employer], PA-C 08/15/2017 12:12 PM

## 2017-08-15 NOTE — Progress Notes (Signed)
  Echocardiogram 2D Echocardiogram has been performed.  Illene Sweeting L Androw 08/15/2017, 9:16 AM

## 2017-08-18 ENCOUNTER — Other Ambulatory Visit (HOSPITAL_COMMUNITY): Payer: Medicare Other

## 2017-08-20 ENCOUNTER — Encounter: Payer: Self-pay | Admitting: Family Medicine

## 2017-08-20 NOTE — Progress Notes (Signed)
   Subjective:   Patient ID: [name]    DOB: Feb 16, 1966, 51 y.o. female   MRN: [id number]  [name] is a 51 y.o. female with a history of anxiety, CHF, CAD, COPD here for   Anxiety - Medications: Zoloft 100mg , Xanax 1mg . Dr. Tammi Klippel suggested increasing Zoloft dose to 100mg  at last visit. - 11 pills left of Xanax, has refill. Some days takes 1 and some 2. - Current stressors: None noted - Coping Mechanisms: breathing exercises - last visit with Dr. Gwenlyn Saran 12/05, Dr. Tammi Klippel increased Zoloft dose to 100mg . - Excited about the holidays, she has family coming into town from [place] she is excited to see. - Notes feeling like she has more energy. - Of note, last Cardiology note with ECHO of increased EF to 50-55% from 20-25%.  Review of Systems:  Per HPI.   [place]: reviewed. Smoking status reviewed. Medications reviewed.  Objective:   BP 100/60   Pulse 83   Temp (!) 97.4 F (36.3 C) (Oral)   Wt 153 lb (69.4 kg)   SpO2 97%   BMI 25.46 kg/m  Vitals and nursing note reviewed.  General: well nourished, well developed, in no acute distress with non-toxic appearance CV: regular rate and rhythm without murmurs, rubs, or gallops Lungs: clear to auscultation bilaterally with normal work of breathing Skin: warm, dry, no rashes or lesions Extremities: warm and well perfused, normal tone MSK: ROM grossly intact, strength intact, gait normal Neuro: Alert and oriented, speech normal  GAD 7 : Generalized Anxiety Score 08/21/2017 08/06/2017 07/23/2017 07/04/2017  Nervous, Anxious, on Edge 2 2 3 3   Control/stop worrying 2 2 3 3   Worry too much - different things 1 2 3 3   Trouble relaxing 1 1 1 3   Restless 1 2 1 2   Easily annoyed or irritable 1 3 3 3   Afraid - awful might happen 0 1 2 1   Total GAD 7 Score 8 13 16 18   Anxiety Difficulty Not difficult at all Not difficult at all Somewhat difficult Somewhat difficult    Assessment & Plan:   Generalized anxiety disorder Seems  to be better controlled on new dose of Zoloft 100mg . Likely not at full effect as has been on for 2 weeks. GAD 8 today much improved from 18 on 11/2. - continue zoloft 100mg  - keep appointments with Drs. Gwenlyn Saran and [place] - f/u in 2 weeks  No orders of the defined types were placed in this encounter.  No orders of the defined types were placed in this encounter.   Rory Percy, DO PGY-1, [facility] 08/21/2017 2:05 PM

## 2017-08-21 ENCOUNTER — Encounter: Payer: Self-pay | Admitting: Family Medicine

## 2017-08-21 ENCOUNTER — Other Ambulatory Visit: Payer: Self-pay

## 2017-08-21 ENCOUNTER — Ambulatory Visit (INDEPENDENT_AMBULATORY_CARE_PROVIDER_SITE_OTHER): Payer: Medicare Other | Admitting: Family Medicine

## 2017-08-21 VITALS — BP 100/60 | HR 83 | Temp 97.4°F | Wt 153.0 lb

## 2017-08-21 DIAGNOSIS — F411 Generalized anxiety disorder: Secondary | ICD-10-CM

## 2017-08-21 NOTE — Patient Instructions (Signed)
It was great to see you!  For your anxiety,  - Your anxiety seems to be more controlled on your current dose of Zoloft. We are not making any changes today. - Follow up in 2 weeks to see how you're doing.  Take care and seek immediate care sooner if you develop any concerns.   Have a very Merry Christmas!!  Rory Percy, [place]

## 2017-08-21 NOTE — Assessment & Plan Note (Signed)
Seems to be better controlled on new dose of Zoloft 100mg . Likely not at full effect as has been on for 2 weeks. GAD 8 today much improved from 18 on 11/2. - continue zoloft 100mg  - keep appointments with Drs. Gwenlyn Saran and [place] - f/u in 2 weeks

## 2017-09-01 ENCOUNTER — Other Ambulatory Visit: Payer: Self-pay | Admitting: Family Medicine

## 2017-09-02 NOTE — Progress Notes (Signed)
PCP: Dr. McDiarmid HF Cardiology: Bensimhon  52 yo with history of CAD and cardiomyopathy probably out of proportion to CAD presents for followup of CHF.  Patient has a history of COPD, no longer smokes.   She was hospitalized in 9/18 with worsening dyspnea.  Echo showed EF 20-25%.  Cath showed 70% ostial left main stenosis.  PFTs were suggestive of severe COPD.  She was evaluated for CABG but this was considered too high risk.  Instead, she had PCI to ostial left main with DES.  She was also diuresed.    Today she presents for HF follow up. Overall feeling fine. Denies SOB/PND/Orthopnea/cp. Appetite ok. No fever or chills. Taking one-two lasix a month. Weight at home 170 pounds. Taking all medications. Not smoking.   Labs (9/18): digoxin 1.1, K 4.4, creatinine 0.83 Labs (07/17/2017): K 3.5 Creatinine 0.79   PMH: 1. COPD: Quit smoking 2003.  PFTs (9/18) with severe obstruction.  2. Chronic systolic CHF: ?Mixed cardiomyopathy (out of proportion to degree of CAD).  - Echo (8/18): EF 20-25%, inferior/inferoseptal akinesis, mildly dilated RV with moderately decreased systolic function, mild-moderate AI, mild MR.  -ECHO 08/2017: EF 50-55%. Grade II DD.Aortic valve: There was moderate regurgitation. - RHC (9/18): mean RA 5, PA 44/22, mean PCWP 7, CI 1.62 Fick/2.21 thermodilution.  3. CAD: LHC (9/18) with 70% ostial left main.  DES to ostial LM.  4. Non-small cell lung CA: 2003.  She had right upper lobectomy, chemotherapy (Iressa), and radiation.  5. H/o colon cancer: resection 2016.  6. H/o melanoma right leg, s/p resection.   Social History   Socioeconomic History  . Marital status: Widowed    Spouse name: Not on file  . Number of children: 1  . Years of education: 74  . Highest education level: Not on file  Social Needs  . Financial resource strain: Not on file  . Food insecurity - worry: Not on file  . Food insecurity - inability: Not on file  . Transportation needs - medical: Not on  file  . Transportation needs - non-medical: Not on file  Occupational History  . Occupation: Disabled- Cytogeneticist: UNEMPLOYED  Tobacco Use  . Smoking status: Former Smoker    Packs/day: 2.00    Years: 23.00    Pack years: 46.00    Types: Cigarettes    Start date: 09/02/1978    Last attempt to quit: 10/26/2001    Years since quitting: 15.8  . Smokeless tobacco: Never Used  . Tobacco comment: Significant passive exposure from boy friend  Substance and Sexual Activity  . Alcohol use: No    Alcohol/week: 0.0 oz    Comment: history of alcohol abuse 20-30  . Drug use: No    Comment: +MJ  . Sexual activity: Not on file  Other Topics Concern  . Not on file  Social History Narrative   Health Care POA:    Emergency Contact:    End of Life Plan:    Who lives with you: self   Any pets: cat, Bart   Diet: Pt has a varied diet but reports not eating much because she is afraid of weight gain.   Exercise: Pt has no regular exercise routine.   Seatbelts: Pt reports wearing seatbelt when in vehicles.    Hobbies: movies         Family History  Problem Relation Age of Onset  . Lung cancer Father        smoker and worked  at a [employer]  . COPD Mother        not a smoker  . Other Mother        hx of hysterectomy for unspecified reason  . Asthma Daughter   . Diabetes Maternal Grandfather   . Colon polyps Brother        approx 16 polyps on his first colonoscopy  . Other Maternal Aunt        non-cancerous growth in lungs; respiratory issues; smoker  . COPD Maternal Grandmother        d. 92  . Cancer Paternal Grandfather        oral/mouth cancer; chewed tobacco; d. older age  . Throat cancer Other        maternal great aunt (MGF's sister); not a smoker  . Cirrhosis Paternal Uncle        hx of alcohol abuse   . Colon cancer Neg Hx   . Rectal cancer Neg Hx   . Stomach cancer Neg Hx    Review of systems complete and found to be negative unless listed in HPI.     Current Outpatient Medications  Medication Sig Dispense Refill  . Albuterol Sulfate (PROAIR RESPICLICK) 614 (90 Base) MCG/ACT AEPB Inhale 2 puffs into the lungs every 6 (six) hours as needed. 3 each 3  . ALPRAZolam (XANAX) 1 MG tablet Take 1 tablet (1 mg total) by mouth 2 (two) times daily as needed for anxiety. 60 tablet 1  . aspirin 81 MG EC tablet Take 1 tablet (81 mg total) by mouth daily. 90 tablet 1  . atorvastatin (LIPITOR) 40 MG tablet Take 1 tablet (40 mg total) by mouth daily at 6 PM. 90 tablet 1  . carvedilol (COREG) 6.25 MG tablet Take 1 tablet (6.25 mg total) 2 (two) times daily with a meal by mouth. 180 tablet 3  . chlorhexidine (PERIDEX) 0.12 % solution Use 63ml, swish in mouth undiluted for 30 seconds, then spit out. Use after breakfast and before bedtime 473/30=16  0  . clopidogrel (PLAVIX) 75 MG tablet Take 1 tablet (75 mg total) by mouth daily. 90 tablet 1  . digoxin (LANOXIN) 0.125 MG tablet Take 0.0625 mg by mouth daily.    . fluticasone furoate-vilanterol (BREO ELLIPTA) 100-25 MCG/INH AEPB Inhale 1 puff into the lungs daily. 180 each 3  . furosemide (LASIX) 40 MG tablet TAKE 1 TABLET( 40 MG TOTAL) BY MOUTH DAILY AS NEEDED FOR INCREASED SWELLING OR WEIGH TGAIN MORE THAN 2 LB 90 tablet 2  . ipratropium-albuterol (DUONEB) 0.5-2.5 (3) MG/3ML SOLN Take 3 mLs by nebulization every 6 (six) hours as needed. 360 mL 1  . losartan (COZAAR) 25 MG tablet Take 1 tablet (25 mg total) by mouth daily. 90 tablet 1  . nitroGLYCERIN (NITROSTAT) 0.4 MG SL tablet Place 1 tablet (0.4 mg total) under the tongue every 5 (five) minutes as needed for chest pain. 30 tablet 12  . polyethylene glycol powder (GLYCOLAX/MIRALAX) powder Take 17 g by mouth 2 (two) times daily as needed. 225 g 5  . sertraline (ZOLOFT) 50 MG tablet Take 1 tablet (50 mg total) daily by mouth. 90 tablet 2  . spironolactone (ALDACTONE) 25 MG tablet Take 1 tablet (25 mg total) by mouth daily. 90 tablet 3  . zolpidem (AMBIEN) 5 MG  tablet 1 at bedtime for sleep as needed 30 tablet 5   No current facility-administered medications for this visit.    There were no vitals filed for this visit. Wt  Readings from Last 3 Encounters:  08/21/17 153 lb (69.4 kg)  08/06/17 153 lb 6.4 oz (69.6 kg)  08/01/17 154 lb 6.4 oz (70 kg)   General:  Well appearing. No resp difficulty. Walked in the clinic without difficulty HEENT: normal Neck: supple. no JVD. Carotids 2+ bilat; no bruits. No lymphadenopathy . + goiter.  Cor: PMI nondisplaced. Regular rate & rhythm. No rubs, gallops or murmurs. Lungs: clear Abdomen: soft, nontender, nondistended. No hepatosplenomegaly. No bruits or masses. Good bowel sounds. Extremities: no cyanosis, clubbing, rash, edema Neuro: alert & orientedx3, cranial nerves grossly intact. moves all 4 extremities w/o difficulty. Affect pleasant   Assessment/Plan: 1. Chronic systolic CHF: Echo 2/59 with EF 20-25%--->08/2017 EF 50-55% Grade II DD. . - Looks great. NYHA I.  Continue lasix as needed.  - Continue coreg  6.25 mg twice a day. .  - Continue spiro 25 mg daily.  - Continue losartan 25 mg daily for now.  - Continue lasix 40 mg as needed.  - STOP digoxin as EF has recovered. Will need BMET at next visit.   -2. CAD: s/p DES to LM.   -No S/S ischemia  - Continue ASA 81, Plavix 75. Will likely continue Plavix long-term.  - Continue atorvastatin 40 daily. Lipids stable 06/2017. 3. Goiter:  - TSH stable at last visit.  4. COPD: Severe by 9/18 PFTs.  No longer smoking.  Follows with Dr. Annamaria Boots.   Follow up in 3-4 months with Dr Aundra Dubin. Will need BMET at that time.   Darrick Grinder, NP  09/02/2017

## 2017-09-03 ENCOUNTER — Ambulatory Visit (HOSPITAL_COMMUNITY)
Admission: RE | Admit: 2017-09-03 | Discharge: 2017-09-03 | Disposition: A | Discharge status: Home / Self Care | Payer: Medicare Other | Source: Ambulatory Visit | Attending: Cardiology | Admitting: Cardiology

## 2017-09-03 ENCOUNTER — Encounter (HOSPITAL_COMMUNITY): Payer: Self-pay

## 2017-09-03 VITALS — BP 108/68 | HR 73 | Wt 152.0 lb

## 2017-09-03 DIAGNOSIS — Z7982 Long term (current) use of aspirin: Secondary | ICD-10-CM | POA: Diagnosis not present

## 2017-09-03 DIAGNOSIS — Z7902 Long term (current) use of antithrombotics/antiplatelets: Secondary | ICD-10-CM | POA: Insufficient documentation

## 2017-09-03 DIAGNOSIS — I5022 Chronic systolic (congestive) heart failure: Secondary | ICD-10-CM | POA: Diagnosis not present

## 2017-09-03 DIAGNOSIS — J449 Chronic obstructive pulmonary disease, unspecified: Secondary | ICD-10-CM | POA: Diagnosis not present

## 2017-09-03 DIAGNOSIS — E049 Nontoxic goiter, unspecified: Secondary | ICD-10-CM | POA: Diagnosis not present

## 2017-09-03 DIAGNOSIS — I2510 Atherosclerotic heart disease of native coronary artery without angina pectoris: Secondary | ICD-10-CM | POA: Insufficient documentation

## 2017-09-03 DIAGNOSIS — Z79899 Other long term (current) drug therapy: Secondary | ICD-10-CM | POA: Insufficient documentation

## 2017-09-03 DIAGNOSIS — Z87891 Personal history of nicotine dependence: Secondary | ICD-10-CM | POA: Diagnosis not present

## 2017-09-03 DIAGNOSIS — Z955 Presence of coronary angioplasty implant and graft: Secondary | ICD-10-CM | POA: Diagnosis not present

## 2017-09-03 MED ORDER — ATORVASTATIN CALCIUM 40 MG PO TABS: 40.0000 mg | ORAL_TABLET | Freq: Every day | ORAL | 3 refills | Status: DC

## 2017-09-03 MED ORDER — FUROSEMIDE 40 MG PO TABS: ORAL_TABLET | ORAL | 3 refills | Status: DC

## 2017-09-03 MED ORDER — SPIRONOLACTONE 25 MG PO TABS: 25.0000 mg | ORAL_TABLET | Freq: Every day | ORAL | 3 refills | Status: DC

## 2017-09-03 MED ORDER — LOSARTAN POTASSIUM 25 MG PO TABS: 25.0000 mg | ORAL_TABLET | Freq: Every day | ORAL | 3 refills | Status: DC

## 2017-09-03 MED ORDER — CLOPIDOGREL BISULFATE 75 MG PO TABS: 75.0000 mg | ORAL_TABLET | Freq: Every day | ORAL | 3 refills | Status: AC

## 2017-09-03 MED ORDER — CARVEDILOL 6.25 MG PO TABS: 6.2500 mg | ORAL_TABLET | Freq: Two times a day (BID) | ORAL | 3 refills | Status: DC

## 2017-09-03 NOTE — Patient Instructions (Signed)
STOP digoxin.  Follow up 3-4 months with Amy Clegg NP-C and lab work.  Take all medication as prescribed the day of your appointment. Bring all medications with you to your appointment.  Do the following things EVERYDAY: 1) Weigh yourself in the morning before breakfast. Write it down and keep it in a log. 2) Take your medicines as prescribed 3) Eat low salt foods-Limit salt (sodium) to 2000 mg per day.  4) Stay as active as you can everyday 5) Limit all fluids for the day to less than 2 liters

## 2017-09-07 NOTE — Progress Notes (Signed)
   Subjective:   Patient ID: [name]    DOB: 12-Sep-1965, 52 y.o. female   MRN: [id number]  [name] is a 52 y.o. female with a history of anxiety here for   Anxiety - Medications: Zoloft 100mg , Xanax 1mg  PRN - Taking: got filled on 12/19 and has 35 pills left of 30 day supply. Sometimes takes 1/2 pill instead of whole pill - Current stressors: nothing in particular comes to mind, little things continue to set her off but not as bad as before. - Coping Mechanisms: still using Xanax, not really trying anything else - Continuing to go to gym for cardiac rehab, doesn't have to go back to Cardiologist for 4 months. - next appointment with Drs. Gwenlyn Saran and [place] on 1/16  Review of Systems:  Per HPI.   [place]: reviewed. Smoking status reviewed. Medications reviewed.  Objective:   BP 122/70   Pulse 93   Temp 98 F (36.7 C) (Oral)   Ht 5\' 5"  (1.651 m)   Wt 153 lb (69.4 kg)   SpO2 95%   BMI 25.46 kg/m  Vitals and nursing note reviewed.  General: well nourished, well developed, in no acute distress with non-toxic appearance CV: regular rate, no lower extremity edema Lungs: normal work of breathing Skin: warm, dry, no rashes or lesions MSK: ROM grossly intact, strength intact, gait normal Neuro: Alert and oriented, speech normal.  GAD 7 : Generalized Anxiety Score 09/08/2017 09/08/2017 08/21/2017 08/06/2017  Nervous, Anxious, on Edge 2 2 2 2   Control/stop worrying 2 2 2 2   Worry too much - different things 1 1 1 2   Trouble relaxing 0 0 1 1  Restless 1 1 1 2   Easily annoyed or irritable 1 1 1 3   Afraid - awful might happen 0 0 0 1  Total GAD 7 Score 7 7 8 13   Anxiety Difficulty Not difficult at all Not difficult at all Not difficult at all Not difficult at all    Assessment & Plan:   Generalized anxiety disorder Patient doing well with noticed improvement on increased dose of Zoloft. Using Xanax some still, but not every day and sometimes breaking pill in half. GAD  7 today, stable from last visit. - Continue Zoloft 100mg  daily - Encourage to find ways to calm down before turning to Xanax (breathing exercises, going to the gym or for a walk, etc) - keep appointment with Drs. Gwenlyn Saran and [place] - f/u in 2 months or sooner if needed  No orders of the defined types were placed in this encounter.  Meds ordered this encounter  Medications  . sertraline (ZOLOFT) 100 MG tablet    Sig: Take 1 tablet (100 mg total) by mouth daily.    Dispense:  90 tablet    Refill:  2    Rory Percy, DO PGY-1, [facility] 09/08/2017 4:56 PM

## 2017-09-08 ENCOUNTER — Encounter: Payer: Self-pay | Admitting: Family Medicine

## 2017-09-08 ENCOUNTER — Other Ambulatory Visit: Payer: Self-pay

## 2017-09-08 ENCOUNTER — Ambulatory Visit (INDEPENDENT_AMBULATORY_CARE_PROVIDER_SITE_OTHER): Payer: Medicare Other | Admitting: Family Medicine

## 2017-09-08 DIAGNOSIS — F411 Generalized anxiety disorder: Secondary | ICD-10-CM | POA: Diagnosis not present

## 2017-09-08 MED ORDER — SERTRALINE HCL 100 MG PO TABS: 100.0000 mg | ORAL_TABLET | Freq: Every day | ORAL | 2 refills | Status: DC

## 2017-09-08 NOTE — Assessment & Plan Note (Signed)
Patient doing well with noticed improvement on increased dose of Zoloft. Using Xanax some still, but not every day and sometimes breaking pill in half. GAD 7 today, stable from last visit. - Continue Zoloft 100mg  daily - Encourage to find ways to calm down before turning to Xanax (breathing exercises, going to the gym or for a walk, etc) - keep appointment with Drs. Gwenlyn Saran and [place] - f/u in 2 months or sooner if needed

## 2017-09-08 NOTE — Patient Instructions (Signed)
It was great to see you!  For your anxiety,  - I am so glad you are having good effects with the new dose of Zoloft! - Continue taking the Zoloft 100mg . I sent a new prescription to your pharmacy. - Work on incorporating additional ways to calm down when you start to feel stressed, like going for a walk or breathing exercises before taking a Xanax to see if you have benefit from them. - continue exercising at the gym - it's good for your heart as well as your anxiety! - Keep your appointment with Dr. Gwenlyn Saran on 1/16.  Take care and seek immediate care sooner if you develop any concerns.   Rory Percy, DO [facility]

## 2017-09-17 ENCOUNTER — Ambulatory Visit (INDEPENDENT_AMBULATORY_CARE_PROVIDER_SITE_OTHER): Payer: Medicare Other | Admitting: Psychology

## 2017-09-17 DIAGNOSIS — F411 Generalized anxiety disorder: Secondary | ICD-10-CM

## 2017-09-17 DIAGNOSIS — F422 Mixed obsessional thoughts and acts: Secondary | ICD-10-CM

## 2017-09-17 NOTE — Patient Instructions (Addendum)
I scheduled you and appointment with Dr. Ky Barban at 2/19 at 10:05 and with [place] 4/3 at 9:00.     I am so happy you are doing better on 100 mg of Zoloft.  I think the medicine is working.  Based on your report, it sounds like you might get additional improvement if you were to increase the medicine.  Dr. Tammi Klippel recommended you INCREASE your ZOLOFT to 150 mg a day.  If this feels like too much medicine, decrease your dose back to 100 mg.\

## 2017-09-17 NOTE — Assessment & Plan Note (Signed)
Patient report of improvement.  On a 1-10 scale with symptoms being the most bothersome (unable to resist the compulsion), she reports being at a 10 and currently is at a 5.  Decreased irritability has accompanied the decrease in anxiety.  Objectively, she did not touch one thing in the office today and did not sanitize her hands.  She sits in a closed body position and reports she is holding her hands in an effort to resist moving things.  This is effortful but significantly improved from prior visits.  Discussed usual doses of Zoloft.  She is interested in trying 150 mg and volunteers that she can always go back down on the medicine if it makes her worse.  This type of thinking was not present last visit.    See patient instructions for further plan.

## 2017-09-17 NOTE — Progress Notes (Signed)
Reason for follow-up:  Continued management of OCD.    Issues discussed:  [name] does not have anything new for the agenda.  She reports she is less fidgety and irritable and is not touching / straightening things like is typical for her with OCD.  People have also commented that she is less irritable.  She is not going to [facility] like [facility] hoped [place] [facility] [place] [facility] [place] and gas is expensive.  She does try to make a Wednesday class there where she gets education and socialization.  She remembers what Dr. Ky Barban said about breathing and moving her body more.  She reports taking advantage of breathing but has not been walking secondary to weather.

## 2017-10-09 ENCOUNTER — Telehealth: Payer: Self-pay | Admitting: Psychology

## 2017-10-09 NOTE — Telephone Encounter (Signed)
Called [name] to let her know that [facility] is no longer available.  The last time she was seen in [facility] was 09/17/2017.  Her biggest issue is OCD for which she is prescribed Sertraline which was tolerated well at 100 mg.  Last visit we suggested she increase to 150 mg.  She also takes Alprazolam 1.0 mg two times daily as needed.  She is using less of this medication now that her anxiety is better managed with Sertraline.  Talked with patient and let her know.  She said that was okay because she is "pretty straightened out" now.  Discussed options.  She has an appointment with Dr. Ky Barban on 10/21/2017.  Dr. Ky Barban has prescribed both of her medicines in the past and will likely be able to continue to do so.  If function changes, we can reassess.

## 2017-10-09 NOTE — Telephone Encounter (Signed)
Thanks so much for the care you've provided thus far for Ms. Gutter! I hope she can continue to see you if you feel it will be beneficial for her. I have no problem prescribing her Xanax. She should still have some Zoloft left to take 150mg  but I'll send refills whenever she needs them, she's usually good about calling ahead of time. So sad we don't have this resource for our patients any more - hopefully we will have another alternative soon.  Bryson Ha

## 2017-10-14 ENCOUNTER — Ambulatory Visit (INDEPENDENT_AMBULATORY_CARE_PROVIDER_SITE_OTHER): Payer: Medicare Other | Admitting: Internal Medicine

## 2017-10-14 ENCOUNTER — Encounter: Payer: Self-pay | Admitting: Internal Medicine

## 2017-10-14 VITALS — BP 120/78 | HR 89 | Ht 65.0 in | Wt 152.0 lb

## 2017-10-14 DIAGNOSIS — J9611 Chronic respiratory failure with hypoxia: Secondary | ICD-10-CM | POA: Diagnosis not present

## 2017-10-14 DIAGNOSIS — G4733 Obstructive sleep apnea (adult) (pediatric): Secondary | ICD-10-CM

## 2017-10-14 DIAGNOSIS — R0683 Snoring: Secondary | ICD-10-CM | POA: Diagnosis not present

## 2017-10-14 DIAGNOSIS — J4541 Moderate persistent asthma with (acute) exacerbation: Secondary | ICD-10-CM | POA: Diagnosis not present

## 2017-10-14 MED ORDER — ZOLPIDEM TARTRATE 5 MG PO TABS: ORAL_TABLET | ORAL | 5 refills | Status: DC

## 2017-10-14 MED ORDER — ALBUTEROL SULFATE 108 (90 BASE) MCG/ACT IN AEPB: 2.0000 | INHALATION_SPRAY | Freq: Four times a day (QID) | RESPIRATORY_TRACT | 3 refills | Status: DC | PRN

## 2017-10-14 MED ORDER — FLUTICASONE FUROATE-VILANTEROL 100-25 MCG/INH IN AEPB: 1.0000 | INHALATION_SPRAY | Freq: Every day | RESPIRATORY_TRACT | 3 refills | Status: DC

## 2017-10-14 NOTE — Progress Notes (Signed)
Patient ID: [name], female    DOB: 1966/02/04, 52 y.o.   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 Walk test on room air 06/28/2016-95%, 95%, 95%, 93%, peak heart rate 120/minute. No desaturation after 3185 feet. Office Spirometry 06/28/2016-limited validity due to cough. Restriction of exhaled volume. FVC 1.79/50%, FEV1 1.64/57%, ratio 0.80. FENO- 5 Walk Test on room Air- Qualified for home O2 04/10/17 PFT 06/17/17- severe obstruction, mild restriction, diffusion severely reduced. Insignificant response to bronchodilator. FVC 1.73/47%, FEV1 1.10/38%, ratio 0.64, TLC 77%, DLCO 32% CHF- acute hosp 05/2017- CHF, CAD, EF 20-25%. Stent. 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 Stent. Added Plavix, spironolactone, Coreg, Cozaar. Breathing is much improved after medical management of CHF during recent hospitalization. She was deemed not a candidate for CABG because of severe COPD. Saw cardiology yesterday and Spiriva lactone dose was increased. She asks I  adjust the prescription to cover the amount, sent to the correct drugstore. Notices some croupy dry cough. Rescue inhaler helps. She comes without oxygen today. CTa chest 04/30/17- No evidence of pulmonary embolism. Increased cardiomegaly, and reflux of contrast into IVC hepatic veins consistent with right heart insufficiency. Increased symmetric ground-glass pulmonary opacity, likely due to mild diffuse pulmonary edema. New tiny right pleural effusion. Stable posttreatment changes in right hemithorax. No evidence of recurrent or metastatic carcinoma within the thorax. Aortic Atherosclerosis (ICD10-I70.0) and Emphysema (ICD10-J43.9). PFT 06/17/17- severe obstruction, mild restriction, diffusion severely reduced. Insignificant response to bronchodilator. 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 Pt never had ONO through [facility].  DuoNeb, Breo 100, ProAir Always stuffy nose.  Aware of loud snoring and describes herself as restless sleeper.  Sometimes tired in the daytime.  Boyfriend does not tell her she stops breathing. Likes Breo 100. Proair used 1x/ week, not wheezing at night.  Review of Systems-+ = pos Constitutional:   No-   weight loss, night sweats, fevers, chills, fatigue, lassitude. HEENT:   No-  headaches, difficulty swallowing, tooth/dental problems, sore throat,      mild sneezing,no- itching, ear ache, nasal congestion, post nasal drip,  CV:  No-   chest pain, orthopnea, PND, swelling in lower extremities, anasarca, dizziness,  palpitations Resp: + shortness of breath with exertion or at rest.              productive cough,  non-productive cough,  No- coughing up of blood.  change in color of mucus.  wheezing.   Skin: No-   rash or lesions. GI:  No-   heartburn, indigestion, abdominal pain, nausea, vomiting,  GU:   MS:  No-   joint pain or swelling.   Neuro-     nothing unusual Psych:  No- change in mood or affect. No depression or anxiety.  No memory loss.   Objective:   Physical Exam General- Alert, Oriented, Affect-appropriate, Distress - NAD  O2 2L   Sat 100% Skin- rash-none, lesions- none, excoriation- none.  Lymphadenopathy- none Head- atraumatic            Eyes- Gross vision intact, PERRLA, conjunctivae clear secretions            Ears- Hearing, canals-normal            Nose- , no-Septal dev, mucus, polyps, erosion, perforation             Throat- Mallampati IVI , mucosa clear , drainage- none, tonsils- atrophic, + hoarse Neck- flexible , trachea midline, no stridor , thyroid nl, carotid no bruit Chest - symmetrical excursion , unlabored           Heart/CV- RRR rapid , no murmur , no gallop  , no rub, nl s1 s2                           - JVD none , edema- none, stasis changes- none, varices- none           Lung-   Wheeze-none, cough+ dry w deep breath, dullness-none, rub- none. + Few crackles left base           Chest wall-  Abd-  Br/ Gen/ Rectal- Not done, not indicated Extrem- cyanosis- none, clubbing, none, atrophy- none, strength- nl,     negative Homans Neuro- grossly intact to observation

## 2017-10-14 NOTE — Patient Instructions (Addendum)
Order- please schedule unattended home sleep test     Dx OSA  Please call me about 2 weeks after your sleep test for results and recommendations. If appropriate, we may be able to start treatment before I see you next.  Refills printed

## 2017-10-19 NOTE — Progress Notes (Signed)
Subjective:   Patient ID: [name]    DOB: [birth year], 52 y.o. female   MRN: [id number]  [name] is a 52 y.o. female with a history of anxiety, CAD, CHF here for   Anxiety - Medications: Zoloft 150mg , Xanax 1mg  prn - Taking: 20 pills left of Xanax filled on 1/17 which she typically has to have a refill by now. Sometimes doesn't take full dose and will break in half. - Current stressors: nothing in particular, endorses "short fuse" - Coping Mechanisms: tried breathing exercises, doesn't help a whole lot, helps a little. Drinking something and trying to relax helps - last appointment with Drs. Gwenlyn Saran and Tammi Klippel 09/17/17, at that time, increased Zoloft from 100mg  to 150mg . - Saw Dr. Annamaria Boots 10/13/17 and will be doing sleep study next week for possible OSA  COUGH Has been coughing for past couple days. Feeling "croupy." Cough is: productive Sputum production: phlegm Medications tried: OTC cough medicine, doesn't work.  Taking blood pressure medications: yes, losartan Using albuterol more than normal, in the last 2 days used 3 times. Usually doesn't use at all. Compliant with Breo. No sick contacts  Symptoms Runny nose: yes Mucous in back of throat: yes and sore throat Throat burning or reflux: yes, using Maalox which helps. No vomiting, relieved with burping Wheezing or asthma: yes, at night Fever: didn't check Chest Pain: no Shortness of breath: no new SOB Leg swelling: no Hemoptysis: no Weight loss: no  Review of Systems:  Per HPI.   [place]: reviewed. Smoking status reviewed. Medications reviewed.  Objective:   BP 110/64   Pulse 93   Temp 97.7 F (36.5 C)   Ht 5\' 5"  (1.651 m)   Wt 153 lb 9.6 oz (69.7 kg)   SpO2 94%   BMI 25.56 kg/m  Vitals and nursing note reviewed.  General: well nourished, well developed, in no acute distress with non-toxic appearance HEENT: normocephalic, atraumatic, moist mucous membranes Neck: supple, non-tender without  lymphadenopathy CV: regular rate and rhythm without murmurs, rubs, or gallops Lungs: clear to auscultation bilaterally with normal work of breathing. No wheezes/rales/rhonchi Skin: warm, dry, no rashes or lesions Extremities: warm and well perfused, normal tone MSK: ROM grossly intact, strength intact, gait normal Neuro: Alert and oriented, speech normal  GAD 7 : Generalized Anxiety Score 10/21/2017 09/08/2017 09/08/2017 08/21/2017  Nervous, Anxious, on Edge 1 2 2 2   Control/stop worrying 2 2 2 2   Worry too much - different things 2 1 1 1   Trouble relaxing 1 0 0 1  Restless 1 1 1 1   Easily annoyed or irritable 3 1 1 1   Afraid - awful might happen 0 0 0 0  Total GAD 7 Score 10 7 7 8   Anxiety Difficulty Not difficult at all Not difficult at all Not difficult at all Not difficult at all    Assessment & Plan:   Generalized anxiety disorder Stable on current regimen. Using less of Xanax, will send in prescription to start in a couple weeks so pharmacy will have per patient request. Decreasing dose of Xanax to 0.5mg  in an effort to wean. Continue zoloft 150mg . With mood disorder clinic cancelled will be seeing Dr. Gwenlyn Saran on an as needed basis, encouraged to see as able.   Cough Likely due to viral illness vs. Acid reflux. Has h/o COPD. No fever, new SOB or lung findings on exam, do not believe this is COPD exacerbation. Also afebrile and well appearing. Could also be due to acid  reflux, responding well to Maalox. Symptomatic management encouraged. Return precautions given.  No orders of the defined types were placed in this encounter.  Meds ordered this encounter  Medications  . sertraline (ZOLOFT) 100 MG tablet    Sig: Take 1.5 tablets (150 mg total) by mouth daily.    Dispense:  135 tablet    Refill:  2  . ALPRAZolam (XANAX) 0.5 MG tablet    Sig: Take 1 tablet (0.5 mg total) by mouth once as needed for anxiety. Take 1-2 tablets by mouth as needed for anxiety.    Dispense:  60 tablet     Refill:  0    Rory Percy, DO PGY-1, [facility] 10/21/2017 11:18 AM

## 2017-10-21 ENCOUNTER — Encounter: Payer: Self-pay | Admitting: Family Medicine

## 2017-10-21 ENCOUNTER — Other Ambulatory Visit: Payer: Self-pay

## 2017-10-21 ENCOUNTER — Ambulatory Visit (INDEPENDENT_AMBULATORY_CARE_PROVIDER_SITE_OTHER): Payer: Medicare Other | Admitting: Family Medicine

## 2017-10-21 ENCOUNTER — Encounter (HOSPITAL_COMMUNITY): Payer: Medicare Other | Admitting: Cardiology

## 2017-10-21 DIAGNOSIS — F411 Generalized anxiety disorder: Secondary | ICD-10-CM

## 2017-10-21 MED ORDER — ALPRAZOLAM 0.5 MG PO TABS: 0.5000 mg | ORAL_TABLET | Freq: Once | ORAL | 0 refills | Status: AC | PRN

## 2017-10-21 MED ORDER — SERTRALINE HCL 100 MG PO TABS: 150.0000 mg | ORAL_TABLET | Freq: Every day | ORAL | 2 refills | Status: DC

## 2017-10-21 NOTE — Patient Instructions (Signed)
It was great to see you!  For your anxiety,  - No changes were made to your regimen. I sent in your refills.  For your cough, - It is likely due to a viral illness, such as a cold. It should start to get better about 7 - 10 days after it started.   - For your nasal congestion and runny nose, try using Afrin (generic is Oxymetazoline) twice daily for 3 days.  Do not use for longer that 3 days.   - Some other therapies you can try are: push fluids, rest, gargle warm salt water and return office visit prn if symptoms persist or worsen.  - Drinking warm liquids such as teas and soups can help with secretions and cough. - A mist humidifier or vaporizer can work well to help with secretions and cough.  It is very important to clean the humidifier between use according to the instructions.    It was good to see you.  If you're still having trouble in the next week, come back and see [place].    Of course, if you start having trouble breathing, worsening fevers, vomiting and unable to hold down any fluids, or you have other concerns, don't hesitate to come back or go to the ED after hours.   Take care and seek immediate care sooner if you develop any concerns.   Rory Percy, DO [facility]

## 2017-10-21 NOTE — Assessment & Plan Note (Signed)
Stable on current regimen. Using less of Xanax, will send in prescription to start in a couple weeks so pharmacy will have per patient request. Decreasing dose of Xanax to 0.5mg  in an effort to wean. Continue zoloft 150mg . With mood disorder clinic cancelled will be seeing Dr. Gwenlyn Saran on an as needed basis, encouraged to see as able.

## 2017-10-27 NOTE — Assessment & Plan Note (Signed)
She is not obese but history and physical exam support possibility that she now has medically significant OSA which would be especially significant given her cardiac history. Plan-schedule sleep study

## 2017-10-27 NOTE — Assessment & Plan Note (Signed)
Better control.  She will continue current meds.

## 2017-10-27 NOTE — Assessment & Plan Note (Signed)
She depends on oxygen and is using it appropriately for sleep and exertion.

## 2017-10-28 ENCOUNTER — Telehealth: Payer: Self-pay | Admitting: Internal Medicine

## 2017-10-28 DIAGNOSIS — G4733 Obstructive sleep apnea (adult) (pediatric): Secondary | ICD-10-CM | POA: Diagnosis not present

## 2017-10-28 MED ORDER — AZITHROMYCIN 250 MG PO TABS: ORAL_TABLET | ORAL | 0 refills | Status: DC

## 2017-10-28 MED ORDER — TRAMADOL HCL 50 MG PO TABS: ORAL_TABLET | ORAL | 0 refills | Status: DC

## 2017-10-28 NOTE — Telephone Encounter (Signed)
Spoke with patient. She stated that since 10/14/17, she has not felt right. She has had some chest congestion. Productive cough with greenish, yellowish phlegm. Increased fatigue. Denies any fever, body aches or chills. She did state that she was around her granddaughter this weekend and she tested positive for strep throat yesterday. She does not have a sore throat nor noticed any white spots on the back of her throat.   She is requesting for a zpak to be called in. She also wants to something for her cough.   She wishes to use [facility].   CY, please advise. Thanks!

## 2017-10-28 NOTE — Telephone Encounter (Signed)
Zpak   250 mg, # 6, 2 today then one daily  Tramadol 50 mg, # 30     1 or 2 every 8 hours as needed for cough   No ref.

## 2017-10-28 NOTE — Telephone Encounter (Signed)
Called pt letting her know we were sending two scripts to her pharmacy of zpak and tramadol.  Pt expressed understanding. Scripts sent to preferred pharmacy. Nothing further needed at this current time.

## 2017-11-04 DIAGNOSIS — G4733 Obstructive sleep apnea (adult) (pediatric): Secondary | ICD-10-CM | POA: Diagnosis not present

## 2017-11-04 DIAGNOSIS — R0683 Snoring: Secondary | ICD-10-CM | POA: Diagnosis not present

## 2017-11-05 ENCOUNTER — Other Ambulatory Visit: Payer: Self-pay | Admitting: *Deleted

## 2017-11-05 DIAGNOSIS — G4733 Obstructive sleep apnea (adult) (pediatric): Secondary | ICD-10-CM

## 2017-11-14 ENCOUNTER — Ambulatory Visit (INDEPENDENT_AMBULATORY_CARE_PROVIDER_SITE_OTHER): Payer: Medicare Other | Admitting: Internal Medicine

## 2017-11-14 ENCOUNTER — Encounter: Payer: Self-pay | Admitting: Internal Medicine

## 2017-11-14 VITALS — BP 119/82 | HR 107 | Temp 98.3°F | Ht 65.0 in | Wt 155.8 lb

## 2017-11-14 DIAGNOSIS — J029 Acute pharyngitis, unspecified: Secondary | ICD-10-CM

## 2017-11-14 DIAGNOSIS — R05 Cough: Secondary | ICD-10-CM

## 2017-11-14 DIAGNOSIS — R059 Cough, unspecified: Secondary | ICD-10-CM | POA: Insufficient documentation

## 2017-11-14 LAB — POCT RAPID STREP A (OFFICE): Rapid Strep A Screen: NEGATIVE

## 2017-11-14 MED ORDER — HYDROCODONE-HOMATROPINE 5-1.5 MG/5ML PO SYRP: 5.0000 mL | ORAL_SOLUTION | Freq: Three times a day (TID) | ORAL | 0 refills | Status: DC | PRN

## 2017-11-14 MED ORDER — LEVOFLOXACIN 750 MG PO TABS: 750.0000 mg | ORAL_TABLET | Freq: Every day | ORAL | 0 refills | Status: DC

## 2017-11-14 NOTE — Progress Notes (Signed)
   Subjective:   Patient: [name]       Birthdate: 02/11/1966       MRN: [id number]      HPI  [name] is a 52 y.o. female presenting for same day appt for cough.   Cough Ongoing since 02/26. Called her pulmonologist about this and was prescribed azithromycin and Tramadol for cough. Improved initially, then symptoms returned. Called pulmonologist for appt, but was told she could not be seen until June, so scheduled appt at [facility]. Endorses non-productive cough with associated chest pain when coughing. Reports fevers with Tmax 101F two days ago as well as chills. Sore throat when coughing as well. Entire family has been sick, some family members with strep throat. Has taken Nyquil and Dayquil in addition to azithro and Tramadol with no improvement in symptoms. Has supplemental O2 which she only wears occasionally. Has been using for past 2 weeks. Has been on 2L, which is what she typically is on when she does use oxygen. Has been taking all of her medications and has not missed any doses. Denies LE edema.   Smoking status reviewed. Patient is former smoker.   Review of Systems See HPI.     Objective:  Physical Exam  Constitutional: She is oriented to person, place, and time and well-developed, well-nourished, and in no distress.  HENT:  Head: Normocephalic and atraumatic.  Nose: Nose normal.  Mouth/Throat: Oropharynx is clear and moist. No oropharyngeal exudate.  Cardiovascular: Normal rate, regular rhythm and normal heart sounds.  Pulmonary/Chest:  Wearing [place]. Coughing throughout exam. Lungs CTAB without wheezes or crackles and with good air movement. Normal WOB on RA and able to speak in full sentences.   Neurological: She is alert and oriented to person, place, and time.  Skin: Skin is warm and dry.  Psychiatric: Affect and judgment normal.     Assessment & Plan:  Cough Multiple possible etiologies. Viral etiology high on differential, especially with only minimal  improvement after azithro treatment. Bacterial sinusitis less likely as no nasal congestion. CAP on differential, especially given patient's history of underlying respiratory issues, recent requirement of supplemental O2, and reported fevers. Would expect sputum production with CAP, though lack of sputum does not rule this out. Given her underlying medical history, will treat for CAP. Recent abx use, so will use levaquin 750mg  qd x7d. Will also prescribed Hydocan q8 PRN cough since patient unable to sleep at night due to cough and with chest pain when coughing. Encouraged not to use Xanax when using Hycodan. Strep negative and no abnormalities on oropharyngeal exam, so sore throat likely 2/2 coughing. Will f/u in one week to ensure improvement.    Adin Hector, MD, MPH PGY-3 [facility] Pager [phone]

## 2017-11-14 NOTE — Patient Instructions (Addendum)
It was nice meeting you today [name]!  Please STOP taking azithromycin, and START taking levaquin. You will take one tablet (750 mg) once daily for the next 7 days.   For cough, you can take the cough syrup up to three times a day (every 8 hours) as needed. This will make you sleepy. Do not take Tramadol while you are taking Hycodan, and try to avoid taking Xanax while you are taking Hycodan as well.   We will see you back in one week to make sure you are getting better. If you start to feel worse before then, please call our office, your pulmonologist's office, or go to the emergency room.   If you have any questions or concerns, please feel free to call the clinic.   Be well,  Dr. Avon Gully

## 2017-11-14 NOTE — Assessment & Plan Note (Signed)
Multiple possible etiologies. Viral etiology high on differential, especially with only minimal improvement after azithro treatment. Bacterial sinusitis less likely as no nasal congestion. CAP on differential, especially given patient's history of underlying respiratory issues, recent requirement of supplemental O2, and reported fevers. Would expect sputum production with CAP, though lack of sputum does not rule this out. Given her underlying medical history, will treat for CAP. Recent abx use, so will use levaquin 750mg  qd x7d. Will also prescribed Hydocan q8 PRN cough since patient unable to sleep at night due to cough and with chest pain when coughing. Encouraged not to use Xanax when using Hycodan. Strep negative and no abnormalities on oropharyngeal exam, so sore throat likely 2/2 coughing. Will f/u in one week to ensure improvement.

## 2017-11-18 ENCOUNTER — Ambulatory Visit (HOSPITAL_COMMUNITY)
Admission: EM | Admit: 2017-11-18 | Discharge: 2017-11-18 | Disposition: A | Discharge status: Home / Self Care | Payer: Medicare Other | Attending: Family Medicine | Admitting: Family Medicine

## 2017-11-18 ENCOUNTER — Ambulatory Visit (INDEPENDENT_AMBULATORY_CARE_PROVIDER_SITE_OTHER): Payer: Medicare Other

## 2017-11-18 ENCOUNTER — Other Ambulatory Visit: Payer: Self-pay

## 2017-11-18 ENCOUNTER — Encounter (HOSPITAL_COMMUNITY): Payer: Self-pay | Admitting: Emergency Medicine

## 2017-11-18 DIAGNOSIS — J40 Bronchitis, not specified as acute or chronic: Secondary | ICD-10-CM | POA: Diagnosis not present

## 2017-11-18 DIAGNOSIS — R05 Cough: Secondary | ICD-10-CM

## 2017-11-18 DIAGNOSIS — Z85118 Personal history of other malignant neoplasm of bronchus and lung: Secondary | ICD-10-CM

## 2017-11-18 MED ORDER — HYDROCOD POLST-CPM POLST ER 10-8 MG/5ML PO SUER: 5.0000 mL | Freq: Two times a day (BID) | ORAL | 0 refills | Status: DC | PRN

## 2017-11-18 MED ORDER — PREDNISONE 10 MG (21) PO TBPK: ORAL_TABLET | Freq: Every day | ORAL | 0 refills | Status: DC

## 2017-11-18 NOTE — ED Provider Notes (Signed)
[facility]   [id number] 11/18/17 Arrival Time: 1314   SUBJECTIVE:  [name] is a 52 y.o. female who presents to the urgent care with complaint of cough that started 2/26.  Patient states she has been through two antibiotics and doesn't feel any better.   Patient does not smoke.  She has asthma  She has is short of breath and wheezing.  Inhalers provide minimal relief.  Vomited once a couple days ago.  Chills and fever are intermittent.  She is exhausted.  Past Medical History:  Diagnosis Date  . Acute bilateral low back pain without sciatica   . Acute congestive heart failure (HCC)    EF 20-25% on ECHO 05/02/2017  . Acute respiratory failure with hypoxia ([place]) 05/01/2017  . 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 Allergic rhinitis   . Allergy   . Anxiety disorder   . Aortic valve regurgitation 05/15/2017  . Asthma   . Chronic bronchitis   . Colon cancer ([place])    adenocarcinoma in a polyps 11-24-2014  . COPD (chronic obstructive pulmonary disease) ([place])   . Cough   . Dizziness and giddiness   . Dyspepsia   . Dyspnea 04/30/2017  . Emphysema of lung ([place])   . External hemorrhoids   . EXTERNAL HEMORRHOIDS 09/27/2009  . Female stress incontinence   . Genetic testing 08/18/2016   Negative for known pathogenic mutations within any of 25 genes on a Custom Panel through [employer].  One variant of uncertain significance (VUS) called "c.7187C>G (p.Thr2396Ser)" was found in one copy of the ATM gene.  This Custom Cancer Panel offered by [employer] includes sequencing and/or duplication/deletion testing of the following 25 genes: APC, ATM, AXIN2, BAP1, BMPR1A, BRCA1, BRCA2, CDH1, CDK4, CDKN2A, CHEK2, EPCAM, MITF, MLH1, MSH2, MSH6, MUTYH, PMS2, POLD1, POLE, PTEN, SCG5/GREM1, SMAD4, STK11, and TP53. Date of report is July 23, 2016.  MSH2 Exons 1-7 Inversion Analysis was also negative through [employer].  Date of report is July 23, 2016.     Marland Kitchen GERD (gastroesophageal  reflux disease)    occ depending on diet   . Goiter   . History of lung cancer   . Hypercholesteremia    denies-last check normal labs  . Hypokalemia   . 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 Insomnia   . Lung cancer ([place])   . Migraine, unspecified, without mention of intractable migraine without mention of status migrainosus   . Panic attacks   . Seizures ([place])    very long ago like 15 years ago or so , questionable etilogy  . Skin cancer (melanoma) ([place])    right calf  . Snoring 12/11/2010   Longstanding hx of snore. NPSG was normal years ago.    Marland Kitchen Swelling of right lower extremity 12/01/2014   Family History  Problem Relation Age of Onset  . Lung cancer Father        smoker and worked at a [employer]  . COPD Mother        not a smoker  . Other Mother        hx of hysterectomy for unspecified reason  . Asthma Daughter   . Diabetes Maternal Grandfather   . Colon polyps Brother        approx 16 polyps on his first colonoscopy  . Other Maternal Aunt        non-cancerous growth in lungs; respiratory issues; smoker  . COPD Maternal Grandmother  d. 80  . Cancer Paternal Grandfather        oral/mouth cancer; chewed tobacco; d. older age  . Throat cancer Other        maternal great aunt (MGF's sister); not a smoker  . Cirrhosis Paternal Uncle        hx of alcohol abuse   . Colon cancer Neg Hx   . Rectal cancer Neg Hx   . Stomach cancer Neg Hx    Social History   Socioeconomic History  . Marital status: Widowed    Spouse name: Not on file  . Number of children: 1  . Years of education: 11  . Highest education level: Not on file  Social Needs  . Financial resource strain: Not on file  . Food insecurity - worry: Not on file  . Food insecurity - inability: Not on file  . Transportation needs - medical: Not on file  . Transportation needs - non-medical: Not on file  Occupational History  . Occupation: Disabled- Chartered loss adjuster: UNEMPLOYED  Tobacco Use  . Smoking status: Former Smoker    Packs/day: 2.00    Years: 23.00    Pack years: 46.00    Types: Cigarettes    Start date: 09/02/1978    Last attempt to quit: 10/26/2001    Years since quitting: 16.0  . Smokeless tobacco: Never Used  . Tobacco comment: Significant passive exposure from boy friend  Substance and Sexual Activity  . Alcohol use: No    Alcohol/week: 0.0 oz    Comment: history of alcohol abuse 20-30  . Drug use: No    Comment: +MJ  . Sexual activity: Not on file  Other Topics Concern  . Not on file  Social History Narrative   Health Care POA:    Emergency Contact:    End of Life Plan:    Who lives with you: self   Any pets: cat, Bart   Diet: Pt has a varied diet but reports not eating much because she is afraid of weight gain.   Exercise: Pt has no regular exercise routine.   Seatbelts: Pt reports wearing seatbelt when in vehicles.    Hobbies: movies         No outpatient medications have been marked as taking for the 11/18/17 encounter [facility] Encounter).   No Known Allergies    ROS: As per HPI, remainder of ROS negative.   OBJECTIVE:   Vitals:   11/18/17 1413  BP: (!) 84/50  Pulse: 89  Resp: (!) 28  Temp: 98.6 F (37 C)  TempSrc: Oral  SpO2: 91%     General appearance: alert; no distress Eyes: PERRL; EOMI; conjunctiva normal HENT: normocephalic; atraumatic; TMs normal, canal normal, external ears normal without trauma; nasal mucosa normal; oral mucosa normal Neck: supple; no adenopathy Lungs: wheezing on expiration bilaterally Heart: regular rate and rhythm Back: no CVA tenderness Extremities: no cyanosis or edema; symmetrical with no gross deformities Skin: warm and dry Neurologic: normal gait; grossly normal Psychological: alert and cooperative; normal mood and affect      Labs:  Results for orders placed or performed in visit on 11/14/17  POCT rapid strep A  Result Value Ref Range   Rapid  Strep A Screen Negative Negative    Labs Reviewed - No data to display  Dg Chest 2 View  Result Date: 11/18/2017 CLINICAL DATA:  Cough for 6 weeks history of lung cancer. EXAM: CHEST - 2 VIEW COMPARISON:  April 30, 2017 FINDINGS: The heart size and mediastinal contours are stable. Opacity with surrounding surgical clips are identified in the right superior mediastinum/elevated right hilum unchanged compared to prior chest x-ray. There is mild increased interstitium bilaterally unchanged compared to prior exam. There is a 7 mm nodular density in the left lung base. The visualized skeletal structures are stable. IMPRESSION: Chronic post treatment and surgical changes of the right hemithorax unchanged compared to prior chest x-ray. Chronic increased pulmonary interstitium bilaterally unchanged. Question 7 mm nodule in the left lung base. Further evaluation with a chest CT on outpatient basis is recommended. Electronically Signed   By: Abelardo Diesel M.D.   On: 11/18/2017 14:47       ASSESSMENT & PLAN:  1. Bronchitis    You will need a follow up chest x-ray in 10 days.  Also, schedule an appointment with your primary care provider. Meds ordered this encounter  Medications  . predniSONE (STERAPRED UNI-PAK 21 TAB) 10 MG (21) TBPK tablet    Sig: Take by mouth daily. Take 6 tabs by mouth daily  for 2 days, then 5 tabs for 2 days, then 4 tabs for 2 days, then 3 tabs for 2 days, 2 tabs for 2 days, then 1 tab by mouth daily for 2 days    Dispense:  42 tablet    Refill:  0  . chlorpheniramine-HYDROcodone (TUSSIONEX PENNKINETIC ER) 10-8 MG/5ML SUER    Sig: Take 5 mLs by mouth every 12 (twelve) hours as needed for cough.    Dispense:  115 mL    Refill:  0    Reviewed expectations re: course of current medical issues. Questions answered. Outlined signs and symptoms indicating need for more acute intervention. Patient verbalized understanding. After Visit Summary given.    Procedures:        Robyn Haber, MD 11/18/17 1459

## 2017-11-18 NOTE — ED Triage Notes (Signed)
Complains of cough that started 2/26.  Patient states she has been through to antibiotics and doesn't feel any better.

## 2017-11-18 NOTE — Discharge Instructions (Signed)
You will need a follow up chest x-ray in 10 days.  Also, schedule an appointment with your primary care provider.

## 2017-12-03 ENCOUNTER — Ambulatory Visit: Payer: Medicare Other | Admitting: Psychology

## 2017-12-05 ENCOUNTER — Ambulatory Visit (INDEPENDENT_AMBULATORY_CARE_PROVIDER_SITE_OTHER): Payer: Medicare Other | Admitting: Family Medicine

## 2017-12-05 ENCOUNTER — Encounter: Payer: Self-pay | Admitting: Family Medicine

## 2017-12-05 ENCOUNTER — Other Ambulatory Visit: Payer: Self-pay

## 2017-12-05 VITALS — BP 98/66 | HR 103 | Temp 98.1°F | Ht 65.0 in | Wt 150.4 lb

## 2017-12-05 DIAGNOSIS — R05 Cough: Secondary | ICD-10-CM | POA: Diagnosis not present

## 2017-12-05 DIAGNOSIS — R059 Cough, unspecified: Secondary | ICD-10-CM

## 2017-12-05 DIAGNOSIS — R918 Other nonspecific abnormal finding of lung field: Secondary | ICD-10-CM | POA: Diagnosis not present

## 2017-12-05 DIAGNOSIS — M25561 Pain in right knee: Secondary | ICD-10-CM | POA: Insufficient documentation

## 2017-12-05 MED ORDER — ALPRAZOLAM 0.5 MG PO TABS: 0.5000 mg | ORAL_TABLET | Freq: Once | ORAL | 0 refills | Status: DC | PRN

## 2017-12-05 NOTE — Patient Instructions (Signed)
It was great to see you!  For your cough,  - It is likely you have bronchitis given you haven't responded to antibiotics. It typically starts to get better after 3-4 weeks but can last as long as 10 weeks. In your case, given your decreased lung function, this is highly possible. Continue using the hycodan for coughing being careful to avoid Xanax when you use it. - Based on your chest xray findings, there was a small nodule. I am getting a Chest CT scan to further work it up. I will call you with those results. - You should follow up with pulmonology after receiving your CT scan.  For your knee pain, - I am getting a knee xray. We must do this prior to getting any injections. - It is likely due to arthritis. - Continue to use tylenol and heat/ice packs for pain.  Take care and seek immediate care sooner if you develop any concerns.   Dr. [facility]   Acute Bronchitis, Adult Acute bronchitis is sudden (acute) swelling of the air tubes (bronchi) in the lungs. Acute bronchitis causes these tubes to fill with mucus, which can make it hard to breathe. It can also cause coughing or wheezing. In adults, acute bronchitis usually goes away within 2 weeks. A cough caused by bronchitis may last up to 3 weeks. Smoking, allergies, and asthma can make the condition worse. Repeated episodes of bronchitis may cause further lung problems, such as chronic obstructive pulmonary disease (COPD). What are the causes? This condition can be caused by germs and by substances that irritate the lungs, including:  Cold and flu viruses. This condition is most often caused by the same virus that causes a cold.  Bacteria.  Exposure to tobacco smoke, dust, fumes, and air pollution.  What increases the risk? This condition is more likely to develop in people who:  Have close contact with someone with acute bronchitis.  Are exposed to lung irritants, such as tobacco smoke, dust, fumes, and  vapors.  Have a weak immune system.  Have a respiratory condition such as asthma.  What are the signs or symptoms? Symptoms of this condition include:  A cough.  Coughing up clear, yellow, or green mucus.  Wheezing.  Chest congestion.  Shortness of breath.  A fever.  Body aches.  Chills.  A sore throat.  How is this diagnosed? This condition is usually diagnosed with a physical exam. During the exam, your health care provider may order tests, such as chest X-rays, to rule out other conditions. He or she may also:  Test a sample of your mucus for bacterial infection.  Check the level of oxygen in your blood. This is done to check for pneumonia.  Do a chest X-ray or lung function testing to rule out pneumonia and other conditions.  Perform blood tests.  Your health care provider will also ask about your symptoms and medical history. How is this treated? Most cases of acute bronchitis clear up over time without treatment. Your health care provider may recommend:  Drinking more fluids. Drinking more makes your mucus thinner, which may make it easier to breathe.  Taking a medicine for a fever or cough.  Taking an antibiotic medicine.  Using an inhaler to help improve shortness of breath and to control a cough.  Using a cool mist vaporizer or humidifier to make it easier to breathe.  Follow these instructions at home: Medicines  Take over-the-counter and prescription medicines only as told by  your health care provider.  If you were prescribed an antibiotic, take it as told by your health care provider. Do not stop taking the antibiotic even if you start to feel better. General instructions  Get plenty of rest.  Drink enough fluids to keep your urine clear or pale yellow.  Avoid smoking and secondhand smoke. Exposure to cigarette smoke or irritating chemicals will make bronchitis worse. If you smoke and you need help quitting, ask your health care provider.  Quitting smoking will help your lungs heal faster.  Use an inhaler, cool mist vaporizer, or humidifier as told by your health care provider.  Keep all follow-up visits as told by your health care provider. This is important. How is this prevented? To lower your risk of getting this condition again:  Wash your hands often with soap and water. If soap and water are not available, use hand sanitizer.  Avoid contact with people who have cold symptoms.  Try not to touch your hands to your mouth, nose, or eyes.  Make sure to get the flu shot every year.  Contact a health care provider if:  Your symptoms do not improve in 2 weeks of treatment. Get help right away if:  You cough up blood.  You have chest pain.  You have severe shortness of breath.  You become dehydrated.  You faint or keep feeling like you are going to faint.  You keep vomiting.  You have a severe headache.  Your fever or chills gets worse. This information is not intended to replace advice given to you by your health care provider. Make sure you discuss any questions you have with your health care provider. Document Released: 09/26/2004 Document Revised: 03/13/2016 Document Reviewed: 02/07/2016 Elsevier Interactive Patient Education  [employer].

## 2017-12-05 NOTE — Assessment & Plan Note (Signed)
Likely due to OA given exam findings. No warmth, swelling, less likely infectious etiology. Will obtain imaging. Patient unable to take NSAIDs due to CAD and CHF. Continue tylenol, heat and/or ice for pain relief.

## 2017-12-05 NOTE — Assessment & Plan Note (Signed)
Persistent productive cough despite antibiotic treatment with azithromycin and levaquin as well as prednisone taper. Intermittent fevers and hot flashes. Lung exam benign today, however patient coughing throughout much of the exam. Generally appears well otherwise. 62mm nodule seen on CXR at Urgent Care, will obtain Chest CT to follow up given history of lung cancer. Most likely bronchitis given duration, lack of hemoptysis and weight loss. Symptomatic treatment explained and encouraged use of hycodan, albuterol, and nebs. Follow up with Pulmonology after chest CT was encouraged. Return precautions given.

## 2017-12-05 NOTE — Progress Notes (Signed)
Subjective:   Patient ID: [name]    DOB: 1965-11-17, 52 y.o. female   MRN: [id number]  [name] is a 52 y.o. female with a history of CHF, CAD, h/o lung cancer, COPD, anxiety here for   COUGH - seen at Urgent Care 3/19 for bronchitis - started 2/26, has been on 2 antibiotics and doesn't feel better per UC. Per UC note, needs f/u CXR in 10 days. Was given Prednisone taper and Tussionex. - Pulm gave azithro x5d and tramadol for cough  - given levaquin on 3/15 x7d, strep negative. Given hycodan for coughing - cousin had cough x4 weeks - hycodan worked for about an hour then started coughing again - Cough is: productive, no hemoptysis - Sputum production: brownish green, sometimes yellow - Medications tried: see above - Taking blood pressure medications: yes  - Runny nose: no - Mucous in back of throat: yes - Throat burning or reflux: no - Wheezing or asthma: yes, at least twice per day using albuterol. Used nebulizer twice since been sick - Fever: sometimes gets chills, highest 101F, last 3 days ago, will get sweaty. Flashes  - Chest Pain: no - Shortness of breath: yes - Leg swelling: no - Hemoptysis: no - Weight loss: yes, 6lb per patient. On chart review, weight stable.  Knee Pain - pain with walking, flexion, extension. - has tried tylenol with minimal relief. Has not tried heat/ice - no trauma, no pops/clicks per patient - no previous imaging.  Review of Systems:  Per HPI.   [place]: reviewed. Smoking status - former smoker. Medications reviewed.  Objective:   BP 98/66   Pulse (!) 103   Temp 98.1 F (36.7 C) (Oral)   Ht 5\' 5"  (1.651 m)   Wt 150 lb 6.4 oz (68.2 kg)   SpO2 97%   BMI 25.03 kg/m  Vitals and nursing note reviewed.  General: well nourished, well developed, in no acute distress with non-toxic appearance HEENT: normocephalic, atraumatic, moist mucous membranes CV: regular rate and rhythm without murmurs, rubs, or gallops, no lower  extremity edema Lungs: clear to auscultation bilaterally with normal work of breathing. No wheezes/rales/rhonchi Skin: warm, dry, no rashes or lesions Extremities: warm and well perfused, normal tone MSK: ROM grossly intact, strength intact, gait normal. R with R knee extension. Negative Lachman's test, posterior drawer. No swelling, warmth. Some sharp pain with lateral pressure. Crepitus palpated on flexion. Neuro: Alert and oriented, speech normal  Assessment & Plan:   Knee pain, right Likely due to OA given exam findings. No warmth, swelling, less likely infectious etiology. Will obtain imaging. Patient unable to take NSAIDs due to CAD and CHF. Continue tylenol, heat and/or ice for pain relief.  Cough Persistent productive cough despite antibiotic treatment with azithromycin and levaquin as well as prednisone taper. Intermittent fevers and hot flashes. Lung exam benign today, however patient coughing throughout much of the exam. Generally appears well otherwise. 67mm nodule seen on CXR at Urgent Care, will obtain Chest CT to follow up given history of lung cancer. Most likely bronchitis given duration, lack of hemoptysis and weight loss. Symptomatic treatment explained and encouraged use of hycodan, albuterol, and nebs. Follow up with Pulmonology after chest CT was encouraged. Return precautions given.  Orders Placed This Encounter  Procedures  . DG Knee Complete 4 Views Right    Standing Status:   Future    Standing Expiration Date:   02/05/2019    Order Specific Question:   Reason for  Exam (SYMPTOM  OR DIAGNOSIS REQUIRED)    Answer:   knee pain    Order Specific Question:   Is patient pregnant?    Answer:   No    Order Specific Question:   Preferred imaging location?    Answer:   [facility]    Order Specific Question:   Radiology Contrast Protocol - do NOT remove file path    Answer:   \\[email or web address]s.pdf  . CT Chest Wo Contrast     Standing Status:   Future    Standing Expiration Date:   02/05/2019    Order Specific Question:   Is patient pregnant?    Answer:   No    Order Specific Question:   Preferred imaging location?    Answer:   [facility]    Order Specific Question:   Radiology Contrast Protocol - do NOT remove file path    Answer:   \\[email or web address]    Order Specific Question:   Reason for Exam additional comments    Answer:   lung nodule  . Basic Metabolic Panel   Meds ordered this encounter  Medications  . ALPRAZolam (XANAX) 0.5 MG tablet    Sig: Take 1 tablet (0.5 mg total) by mouth once as needed for up to 1 dose for anxiety.    Dispense:  20 tablet    Refill:  0    Lori Percy, DO PGY-1, [facility] 12/05/2017 3:05 PM

## 2017-12-06 LAB — BASIC METABOLIC PANEL
BUN/Creatinine Ratio: 15 (ref 9–23)
BUN: 12 mg/dL (ref 6–24)
CALCIUM: 9.2 mg/dL (ref 8.7–10.2)
CHLORIDE: 105 mmol/L (ref 96–106)
CO2: 20 mmol/L (ref 20–29)
Creatinine, Ser: 0.8 mg/dL (ref 0.57–1.00)
GFR calc non Af Amer: 84 mL/min/{1.73_m2} (ref >59)
GFR, EST AFRICAN AMERICAN: 97 mL/min/{1.73_m2} (ref >59)
Glucose: 78 mg/dL (ref 65–99)
POTASSIUM: 4.5 mmol/L (ref 3.5–5.2)
Sodium: 140 mmol/L (ref 134–144)

## 2017-12-09 ENCOUNTER — Other Ambulatory Visit: Payer: Self-pay | Admitting: Family Medicine

## 2017-12-11 ENCOUNTER — Other Ambulatory Visit: Payer: Self-pay | Admitting: Internal Medicine

## 2017-12-11 DIAGNOSIS — J4541 Moderate persistent asthma with (acute) exacerbation: Secondary | ICD-10-CM

## 2017-12-16 ENCOUNTER — Ambulatory Visit (HOSPITAL_COMMUNITY)
Admission: RE | Admit: 2017-12-16 | Discharge: 2017-12-16 | Disposition: A | Discharge status: Home / Self Care | Payer: Medicare Other | Source: Ambulatory Visit | Attending: Family Medicine | Admitting: Family Medicine

## 2017-12-16 DIAGNOSIS — R918 Other nonspecific abnormal finding of lung field: Secondary | ICD-10-CM | POA: Diagnosis present

## 2017-12-16 DIAGNOSIS — J439 Emphysema, unspecified: Secondary | ICD-10-CM | POA: Insufficient documentation

## 2017-12-16 DIAGNOSIS — I700 Atherosclerosis of aorta: Secondary | ICD-10-CM | POA: Diagnosis not present

## 2017-12-18 ENCOUNTER — Telehealth: Payer: Self-pay | Admitting: *Deleted

## 2017-12-18 NOTE — Telephone Encounter (Signed)
-----   Message from Rory Percy, DO sent at 12/17/2017  7:26 PM EDT ----- Please let patient know her CT chest was unchanged from previous and no new masses or lesions were identified.

## 2017-12-18 NOTE — Telephone Encounter (Signed)
Patient informed of results. Emonnie Cannady,CMA

## 2017-12-18 NOTE — Telephone Encounter (Signed)
-----   Message from Rory Percy, DO sent at 12/17/2017  7:28 PM EDT ----- Please let patient know her knee xray did not show any evidence of fracture, effusion, or arthritic changes.

## 2017-12-30 ENCOUNTER — Encounter: Payer: Self-pay | Admitting: Family Medicine

## 2017-12-30 ENCOUNTER — Other Ambulatory Visit: Payer: Self-pay

## 2017-12-30 ENCOUNTER — Ambulatory Visit (INDEPENDENT_AMBULATORY_CARE_PROVIDER_SITE_OTHER): Payer: Medicare Other | Admitting: Family Medicine

## 2017-12-30 VITALS — BP 100/64 | HR 99 | Temp 98.4°F | Wt 150.2 lb

## 2017-12-30 DIAGNOSIS — F411 Generalized anxiety disorder: Secondary | ICD-10-CM

## 2017-12-30 MED ORDER — ALPRAZOLAM 0.5 MG PO TABS: 0.5000 mg | ORAL_TABLET | Freq: Once | ORAL | 0 refills | Status: DC | PRN

## 2017-12-30 NOTE — Assessment & Plan Note (Signed)
Having some breakthrough anxiety with decrease in Xanax dose. Counseled that this is likely to happen and again encouraged and educated that decreasing Xanax is the safest option for patient, she is agreeable. Controlled Database checked and is comparable with history. Will give Xanax refill, will keep on current dose. Discussed current stressors, encouraged use of hobbies (cooking, playing with her cat) and things she finds enjoyable to cope when she feels anxious. Follow up in 2 months.

## 2017-12-30 NOTE — Patient Instructions (Signed)
It was great to see you!  For your anxiety,  - I am refilling your Zoloft and Xanax - I encourage you to try a hobby or something you enjoy whenever you start to feel anxious.  - Because you often feel anxious in the evenings, you can try cooking or preparing your evening meal whenever this happens as you seem to enjoy cooking and it will provide nutrition and creative outlet. Feel free to have fun in the kitchen and experiment with different foods and spices.  Take care and seek immediate care sooner if you develop any concerns.   Dr. [facility]

## 2017-12-30 NOTE — Progress Notes (Signed)
   Subjective:   Patient ID: [name]    DOB: 1966/05/29, 52 y.o. female   MRN: [id number]  [name] is a 52 y.o. female with a history of CHF, h/o lung cancer, COPD, GAD here for   Anxiety - Medications: Zoloft 150mg , Xanax 0.5mg  prn (decreased last visit) - Taking: been without xanax for the last 5 days. Was taking about 3 per day. - Current stressors: she notices she gets tired and irritable in the evenings which can then trigger anxiety.  - Coping Mechanisms: enjoys cooking, watching tv, playing with her cat - last filled Xanax 11/25/2017.  Review of Systems:  Per HPI.   [place]: reviewed. Smoking status reviewed. Medications reviewed.  Objective:   BP 100/64   Pulse 99   Temp 98.4 F (36.9 C) (Oral)   Wt 150 lb 3.2 oz (68.1 kg)   SpO2 (!) 87%   BMI 24.99 kg/m  Vitals and nursing note reviewed.  General: well nourished, well developed, with non-toxic appearance. HEENT: normocephalic, atraumatic, moist mucous membranes Neck: supple, non-tender without lymphadenopathy CV: regular rate and rhythm without murmurs, rubs, or gallops, no lower extremity edema Lungs: clear to auscultation bilaterally with normal work of breathing Abdomen: soft, non-tender, non-distended, no masses or organomegaly palpable, normoactive bowel sounds Skin: warm, dry, no rashes or lesions Extremities: warm and well perfused, normal tone MSK: ROM grossly intact, strength intact, gait normal Neuro: Alert and oriented, speech normal Psych: Persistently fidgety, picking at skin and adjusting medication box. Easily distracted.  Assessment & Plan:   Generalized anxiety disorder Having some breakthrough anxiety with decrease in Xanax dose. Counseled that this is likely to happen and again encouraged and educated that decreasing Xanax is the safest option for patient, she is agreeable. Controlled Database checked and is comparable with history. Will give Xanax refill, will keep on current  dose. Discussed current stressors, encouraged use of hobbies (cooking, playing with her cat) and things she finds enjoyable to cope when she feels anxious. Follow up in 2 months.  No orders of the defined types were placed in this encounter.  Meds ordered this encounter  Medications  . ALPRAZolam (XANAX) 0.5 MG tablet    Sig: Take 1 tablet (0.5 mg total) by mouth once as needed for up to 1 dose for anxiety.    Dispense:  30 tablet    Refill:  0    Rory Percy, DO PGY-1, [facility] 12/30/2017 5:08 PM

## 2017-12-31 NOTE — Progress Notes (Signed)
Advanced Heart Failure Clinic Note   PCP: Dr. McDiarmid [facility] Cardiology: [facility]  [name] is a 52 y.o. female with history of CAD and cardiomyopathy probably out of proportion to CAD presents for followup of CHF.  Patient has a history of COPD, no longer smokes.   She was hospitalized in 9/18 with worsening dyspnea.  Echo showed EF 20-25%.  Cath showed 70% ostial left main stenosis.  PFTs were suggestive of severe COPD.  She was evaluated for CABG but this was considered too high risk.  Instead, she had PCI to ostial left main with DES.  She was also diuresed.    She presents today for regular follow up. At last visit, digoxin stopped with recovery of EF. She is doing well from a HF perspective. She is incredibly anxious. Her PCP has been slowly cutting back her Xanax and adjusting her Zoloft. She denies DOE, PND, or orthopnea. She has mild lightheadedness with rapid standing from bending over.  Weight at home stable. She has been taking all of her medications as directed.   Labs (9/18): digoxin 1.1, K 4.4, creatinine 0.83 Labs (07/17/2017): K 3.5 Creatinine 0.79   PMH: 1. COPD: Quit smoking 2003.  PFTs (9/18) with severe obstruction.  2. Chronic systolic CHF: ?Mixed cardiomyopathy (out of proportion to degree of CAD).  - Echo (8/18): EF 20-25%, inferior/inferoseptal akinesis, mildly dilated RV with moderately decreased systolic function, mild-moderate AI, mild MR.  -ECHO 08/2017: EF 50-55%. Grade II DD.Aortic valve: There was moderate regurgitation. - RHC (9/18): mean RA 5, PA 44/22, mean PCWP 7, CI 1.62 Fick/2.21 thermodilution.  3. CAD: LHC (9/18) with 70% ostial left main.  DES to ostial LM.  4. Non-small cell lung CA: 2003.  She had right upper lobectomy, chemotherapy (Iressa), and radiation.  5. H/o colon cancer: resection 2016.  6. H/o melanoma right leg, s/p resection.   Review of systems complete and found to be negative unless listed in HPI.    Social History    Socioeconomic History  . Marital status: Widowed    Spouse name: Not on file  . Number of children: 1  . Years of education: 33  . Highest education level: Not on file  Occupational History  . Occupation: Disabled- Cytogeneticist: UNEMPLOYED  Social Needs  . Financial resource strain: Not on file  . Food insecurity:    Worry: Not on file    Inability: Not on file  . Transportation needs:    Medical: Not on file    Non-medical: Not on file  Tobacco Use  . Smoking status: Former Smoker    Packs/day: 2.00    Years: 23.00    Pack years: 46.00    Types: Cigarettes    Start date: 09/02/1978    Last attempt to quit: 10/26/2001    Years since quitting: 16.1  . Smokeless tobacco: Never Used  . Tobacco comment: Significant passive exposure from boy friend  Substance and Sexual Activity  . Alcohol use: No    Alcohol/week: 0.0 oz    Comment: history of alcohol abuse 20-30  . Drug use: No    Comment: +MJ  . Sexual activity: Not on file  Lifestyle  . Physical activity:    Days per week: Not on file    Minutes per session: Not on file  . Stress: Not on file  Relationships  . Social connections:    Talks on phone: Not on file    Gets together: Not  on file    Attends religious service: Not on file    Active member of club or organization: Not on file    Attends meetings of clubs or organizations: Not on file    Relationship status: Not on file  . Intimate partner violence:    Fear of current or ex partner: Not on file    Emotionally abused: Not on file    Physically abused: Not on file    Forced sexual activity: Not on file  Other Topics Concern  . Not on file  Social History Narrative   Health Care POA:    Emergency Contact:    End of Life Plan:    Who lives with you: self   Any pets: cat, Bart   Diet: Pt has a varied diet but reports not eating much because she is afraid of weight gain.   Exercise: Pt has no regular exercise routine.   Seatbelts: Pt reports  wearing seatbelt when in vehicles.    Hobbies: movies         Family History  Problem Relation Age of Onset  . Lung cancer Father        smoker and worked at a Academic libr[employer]  . COPD Mother        not a smoker  . Other Mother        hx of hysterectomy for unspecified reason  . Asthma Daughter   . Diabetes Maternal Grandfather   . Colon polyps Brother        approx 16 polyps on his first colonoscopy  . Other Maternal Aunt        non-cancerous growth in lungs; respiratory issues; smoker  . COPD Maternal Grandmother        d. 51  . Cancer Paternal Grandfather        oral/mouth cancer; chewed tobacco; d. older age  . Throat cancer Other        maternal great aunt (MGF's sister); not a smoker  . Cirrhosis Paternal Uncle        hx of alcohol abuse   . Colon cancer Neg Hx   . Rectal cancer Neg Hx   . Stomach cancer Neg Hx     Current Outpatient Medications  Medication Sig Dispense Refill  . Albuterol Sulfate (PROAIR RESPICLICK) 425 (90 Base) MCG/ACT AEPB Inhale 2 puffs into the lungs every 6 (six) hours as needed. 3 each 3  . ALPRAZolam (XANAX) 0.5 MG tablet Take 1 tablet (0.5 mg total) by mouth once as needed for up to 1 dose for anxiety. 30 tablet 0  . atorvastatin (LIPITOR) 40 MG tablet Take 1 tablet (40 mg total) by mouth daily at 6 PM. 90 tablet 3  . carvedilol (COREG) 6.25 MG tablet Take 1 tablet (6.25 mg total) by mouth 2 (two) times daily with a meal. 180 tablet 3  . chlorhexidine (PERIDEX) 0.12 % solution Use 6ml, swish in mouth undiluted for 30 seconds, then spit out. Use after breakfast and before bedtime 473/30=16  0  . chlorpheniramine-HYDROcodone (TUSSIONEX PENNKINETIC ER) 10-8 MG/5ML SUER Take 5 mLs by mouth every 12 (twelve) hours as needed for cough. 115 mL 0  . fluticasone furoate-vilanterol (BREO ELLIPTA) 100-25 MCG/INH AEPB Inhale 1 puff into the lungs daily. 180 each 3  . furosemide (LASIX) 40 MG tablet TAKE 1 TABLET( 40 MG TOTAL) BY MOUTH DAILY AS NEEDED FOR  INCREASED SWELLING OR WEIGH TGAIN MORE THAN 2 LB 90 tablet 3  . HYDROcodone-homatropine (HYCODAN)  5-1.5 MG/5ML syrup Take 5 mLs by mouth every 8 (eight) hours as needed for cough. 120 mL 0  . ipratropium-albuterol (DUONEB) 0.5-2.5 (3) MG/3ML SOLN USE 1 VIAL VIA NEBULIZER EVERY 6 HOURS AS NEEDED 1080 mL 0  . losartan (COZAAR) 25 MG tablet Take 1 tablet (25 mg total) by mouth daily. 90 tablet 3  . nitroGLYCERIN (NITROSTAT) 0.4 MG SL tablet Place 1 tablet (0.4 mg total) under the tongue every 5 (five) minutes as needed for chest pain. 30 tablet 12  . polyethylene glycol powder (GLYCOLAX/MIRALAX) powder Take 17 g by mouth 2 (two) times daily as needed. 225 g 5  . predniSONE (STERAPRED UNI-PAK 21 TAB) 10 MG (21) TBPK tablet Take by mouth daily. Take 6 tabs by mouth daily  for 2 days, then 5 tabs for 2 days, then 4 tabs for 2 days, then 3 tabs for 2 days, 2 tabs for 2 days, then 1 tab by mouth daily for 2 days 42 tablet 0  . sertraline (ZOLOFT) 100 MG tablet Take 1.5 tablets (150 mg total) by mouth daily. 135 tablet 2  . spironolactone (ALDACTONE) 25 MG tablet Take 1 tablet (25 mg total) by mouth daily. 90 tablet 3  . traMADol (ULTRAM) 50 MG tablet Take 1 or 2 tablets every 8 hours as needed for cough 30 tablet 0  . zolpidem (AMBIEN) 5 MG tablet 1 at bedtime for sleep as needed 30 tablet 5   No current facility-administered medications for this visit.    Vitals:   01/01/18 1033  BP: 114/72  Pulse: 83  SpO2: 92%  Weight: 149 lb 3.2 oz (67.7 kg)    Wt Readings from Last 3 Encounters:  01/01/18 149 lb 3.2 oz (67.7 kg)  12/30/17 150 lb 3.2 oz (68.1 kg)  12/05/17 150 lb 6.4 oz (68.2 kg)   Physical Exam General: Visibly anxious. No resp difficulty. HEENT: Normal Neck: Supple. JVP 5-6. Carotids 2+ bilat; no bruits. No thyromegaly or nodule noted. +goiter Cor: PMI nondisplaced. RRR, No M/G/R noted Lungs: CTAB, normal effort. Abdomen: Soft, non-tender, non-distended, no HSM. No bruits or masses.  +BS  Extremities: No cyanosis, clubbing, or rash. R and LLE no edema. Nail beds with significant excoriations.  Neuro: Alert & orientedx3, cranial nerves grossly intact. moves all 4 extremities w/o difficulty. Affect pleasant   Assessment/Plan: 1. Chronic systolic CHF: Echo 0/24 with EF 20-25%--->08/2017 EF 50-55% Grade II DD. . - NYHA II - Volume status stable on exam.   - Continue lasix as needed.  - Continue coreg  6.25 mg BID - Continue spiro 25 mg daily.  - Continue losartan 25 mg daily. BMET today.  - Continue lasix 40 mg as needed.  - Off digoxin with recovered EF - Reinforced fluid restriction to < 2 L daily, sodium restriction to less than 2000 mg daily, and the importance of daily weights.   2. CAD: s/p DES to LM.   - No s/s of ischemia.    - Continue ASA 81, Plavix 75. Will likely continue Plavix long-term.  - Continue atorvastatin 40 daily. Lipids stable 06/2017. 3. Goiter:  - TSH stable 06/16/17. Per PCP.  4. COPD:  - Severe by 9/18 PFTs.   - Sees Dr. Annamaria Boots.  5. Anxiety - Pt is remarkably anxious on exam and interview. She is having trouble with her Xanax being cut back, as she has been on higher doses for close to 10 years.  - From reviewing the notes, this looks like it is  being handled excellently by Dr. Ky Barban. Will touch base with her to see if she recommends Psych follow up with closing of [facility].   RTC 4-6 months with Dr. Aundra Dubin. Recent labs stable.   Shirley Friar, PA-C  12/31/2017  Greater than 50% of the 25 minute visit was spent in counseling/coordination of care regarding disease state education, salt/fluid restriction, sliding scale diuretics, and medication compliance.

## 2018-01-01 ENCOUNTER — Encounter (HOSPITAL_COMMUNITY): Payer: Self-pay

## 2018-01-01 ENCOUNTER — Ambulatory Visit (HOSPITAL_COMMUNITY)
Admission: RE | Admit: 2018-01-01 | Discharge: 2018-01-01 | Disposition: A | Discharge status: Home / Self Care | Payer: Medicare Other | Source: Ambulatory Visit | Attending: Internal Medicine | Admitting: Internal Medicine

## 2018-01-01 VITALS — BP 114/72 | HR 83 | Wt 149.2 lb

## 2018-01-01 DIAGNOSIS — Z85118 Personal history of other malignant neoplasm of bronchus and lung: Secondary | ICD-10-CM | POA: Insufficient documentation

## 2018-01-01 DIAGNOSIS — Z7982 Long term (current) use of aspirin: Secondary | ICD-10-CM | POA: Diagnosis not present

## 2018-01-01 DIAGNOSIS — Z79899 Other long term (current) drug therapy: Secondary | ICD-10-CM | POA: Diagnosis not present

## 2018-01-01 DIAGNOSIS — I5022 Chronic systolic (congestive) heart failure: Secondary | ICD-10-CM | POA: Diagnosis not present

## 2018-01-01 DIAGNOSIS — I429 Cardiomyopathy, unspecified: Secondary | ICD-10-CM | POA: Insufficient documentation

## 2018-01-01 DIAGNOSIS — E049 Nontoxic goiter, unspecified: Secondary | ICD-10-CM | POA: Diagnosis not present

## 2018-01-01 DIAGNOSIS — Z85038 Personal history of other malignant neoplasm of large intestine: Secondary | ICD-10-CM | POA: Diagnosis not present

## 2018-01-01 DIAGNOSIS — J449 Chronic obstructive pulmonary disease, unspecified: Secondary | ICD-10-CM | POA: Insufficient documentation

## 2018-01-01 DIAGNOSIS — Z85820 Personal history of malignant melanoma of skin: Secondary | ICD-10-CM | POA: Diagnosis not present

## 2018-01-01 DIAGNOSIS — F411 Generalized anxiety disorder: Secondary | ICD-10-CM

## 2018-01-01 DIAGNOSIS — F419 Anxiety disorder, unspecified: Secondary | ICD-10-CM | POA: Diagnosis not present

## 2018-01-01 DIAGNOSIS — Z808 Family history of malignant neoplasm of other organs or systems: Secondary | ICD-10-CM | POA: Insufficient documentation

## 2018-01-01 DIAGNOSIS — Z7902 Long term (current) use of antithrombotics/antiplatelets: Secondary | ICD-10-CM | POA: Insufficient documentation

## 2018-01-01 DIAGNOSIS — Z8371 Family history of colonic polyps: Secondary | ICD-10-CM | POA: Diagnosis not present

## 2018-01-01 DIAGNOSIS — J9611 Chronic respiratory failure with hypoxia: Secondary | ICD-10-CM

## 2018-01-01 DIAGNOSIS — Z87891 Personal history of nicotine dependence: Secondary | ICD-10-CM | POA: Insufficient documentation

## 2018-01-01 DIAGNOSIS — I2510 Atherosclerotic heart disease of native coronary artery without angina pectoris: Secondary | ICD-10-CM | POA: Diagnosis not present

## 2018-01-01 MED ORDER — SPIRONOLACTONE 25 MG PO TABS: 25.0000 mg | ORAL_TABLET | Freq: Every day | ORAL | 3 refills | Status: DC

## 2018-01-01 MED ORDER — CARVEDILOL 6.25 MG PO TABS: 6.2500 mg | ORAL_TABLET | Freq: Two times a day (BID) | ORAL | 3 refills | Status: DC

## 2018-01-01 MED ORDER — LOSARTAN POTASSIUM 25 MG PO TABS: 25.0000 mg | ORAL_TABLET | Freq: Every day | ORAL | 3 refills | Status: DC

## 2018-01-01 MED ORDER — ATORVASTATIN CALCIUM 40 MG PO TABS: 40.0000 mg | ORAL_TABLET | Freq: Every day | ORAL | 3 refills | Status: DC

## 2018-01-01 NOTE — Patient Instructions (Signed)
Follow up with Dr. Aundra Dubin in 5-6 months.  Please call us in October to schedule your appointment. [phone], Option 3.

## 2018-01-12 NOTE — Progress Notes (Signed)
No specific barriers! She seemed relatively stable from a HF perspective, I just promised her I would follow up. I tried to explain the benefit of increasing her baseline anxiety therapy and using less Xanax.Thank you!   Legrand Como [address]" [place], PA-C 01/12/2018 3:35 PM

## 2018-01-14 ENCOUNTER — Ambulatory Visit (INDEPENDENT_AMBULATORY_CARE_PROVIDER_SITE_OTHER): Payer: Medicare Other | Admitting: Internal Medicine

## 2018-01-14 ENCOUNTER — Encounter: Payer: Self-pay | Admitting: Internal Medicine

## 2018-01-14 VITALS — BP 98/62 | HR 86 | Ht 65.0 in | Wt 150.4 lb

## 2018-01-14 DIAGNOSIS — I9589 Other hypotension: Secondary | ICD-10-CM | POA: Diagnosis not present

## 2018-01-14 DIAGNOSIS — J8482 Adult pulmonary Langerhans cell histiocytosis: Secondary | ICD-10-CM | POA: Diagnosis not present

## 2018-01-14 DIAGNOSIS — J449 Chronic obstructive pulmonary disease, unspecified: Secondary | ICD-10-CM

## 2018-01-14 DIAGNOSIS — J9611 Chronic respiratory failure with hypoxia: Secondary | ICD-10-CM | POA: Diagnosis not present

## 2018-01-14 NOTE — Progress Notes (Signed)
Patient ID: [name], female    DOB: 27-Dec-1965, 52 y.o.   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 Walk test on room air 06/28/2016-95%, 95%, 95%, 93%, peak heart rate 120/minute. No desaturation after 3185 feet. Office Spirometry 06/28/2016-limited validity due to cough. Restriction of exhaled volume. FVC 1.79/50%, FEV1 1.64/57%, ratio 0.80. FENO- 5 Walk Test on room Air- Qualified for home O2 04/10/17 PFT 06/17/17- severe obstruction, mild restriction, diffusion severely reduced. Insignificant response to bronchodilator. FVC 1.73/47%, FEV1 1.10/38%, ratio 0.64, TLC 77%, DLCO 32% CHF- acute hosp 05/2017- CHF, CAD, EF 20-25%. Stent. 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 Pt never had ONO through [facility].  DuoNeb, Breo 100, ProAir Always stuffy nose.  Aware of loud snoring and describes herself as restless sleeper.  Sometimes tired in the daytime.  Boyfriend does  not tell her she stops breathing. Likes Breo 100. Proair used 1x/ week, not wheezing at night.  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  She only needs to continue her oxygen at 2 L for sleep as she is doing. So she no longer has productive cough.  Reports anxiety increased by reduction of her Xanax which is being managed by her primary physician the [facility].  Imaging reviewed. Home Sleep Test 10/28/2017- AHI 3.2/hour, desaturation to 82% with average 88%.  345.8 minutes recorded with saturation less than/equal to 88% on room air.  Body weight 152 pounds CT chest 12/17/2017- IMPRESSION: Stable postoperative and post radiation changes are noted in the right upper lobe. Stable reticular densities are noted throughout both lungs most consistent with chronic interstitial lung disease or scarring. No significant pulmonary nodule or mass is noted. Aortic Atherosclerosis (ICD10-I70.0) and Emphysema (ICD10-J43.9).  Review of Systems-+ = positive Constitutional:   No-   weight loss, night sweats, fevers, chills, fatigue, lassitude. HEENT:   No-  headaches, difficulty swallowing, tooth/dental problems, sore throat,      mild sneezing,no- itching, ear ache, nasal congestion, post nasal drip,  CV:  No-   chest pain, orthopnea, PND, swelling in lower extremities, anasarca, dizziness, palpitations Resp: + shortness of breath with exertion or at rest.              productive cough,  non-productive cough,  No- coughing up of blood.  change in color of mucus.  wheezing.   Skin: No-   rash or lesions. GI:  No-   heartburn, indigestion, abdominal pain, nausea, vomiting,  GU:   MS:  No-   joint pain or swelling.   Neuro-     nothing unusual Psych:  No- change in mood or affect. No depression or anxiety.  No memory loss.   Objective:   Physical Exam General- Alert, Oriented, Affect-appropriate, Distress - NAD  O2 2L   Sat 100% Skin- rash-none, lesions- none, excoriation- none.  Lymphadenopathy- none Head- atraumatic            Eyes- Gross vision intact, PERRLA, conjunctivae clear secretions            Ears- Hearing, canals-normal            Nose- , no-Septal dev, mucus, polyps, erosion, perforation             Throat- Mallampati IVI , mucosa clear , drainage- none, tonsils- atrophic, + hoarse Neck- flexible , trachea midline, no stridor , thyroid nl, carotid no bruit Chest - symmetrical excursion , unlabored           Heart/CV- RRR rapid , no murmur , no gallop  , no rub, nl s1 s2                           - JVD none , edema- none, stasis changes- none, varices- none           Lung-   Wheeze+trace, cough- none, dullness-none, rub- none. Rales- none           Chest wall-  Abd-  Br/ Gen/ Rectal- Not done, not indicated Extrem- cyanosis- none, clubbing, none, atrophy- none, strength- nl,     negative Homans Neuro- grossly intact to observation

## 2018-01-14 NOTE — Patient Instructions (Signed)
Order- work with [facility] to establish with primary care  Establish with My Chart so it is easy to request prescription refills.  Please call as needed

## 2018-01-15 NOTE — Assessment & Plan Note (Signed)
Home sleep test confirmed she continues to need oxygen during sleep but she oxygenated is well during the daytime.

## 2018-01-15 NOTE — Assessment & Plan Note (Signed)
She uses her inhalers and occasionally her nebulizer as needed.  She is near her baseline now.

## 2018-01-15 NOTE — Assessment & Plan Note (Signed)
Originally a radiologic diagnosis without biopsy.  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  We will watch for stability.

## 2018-01-27 ENCOUNTER — Ambulatory Visit (INDEPENDENT_AMBULATORY_CARE_PROVIDER_SITE_OTHER): Payer: Medicare Other | Admitting: Family Medicine

## 2018-01-27 ENCOUNTER — Encounter: Payer: Self-pay | Admitting: Family Medicine

## 2018-01-27 ENCOUNTER — Other Ambulatory Visit: Payer: Self-pay

## 2018-01-27 VITALS — BP 122/86 | HR 101 | Temp 97.8°F | Ht 65.0 in | Wt 149.0 lb

## 2018-01-27 DIAGNOSIS — F411 Generalized anxiety disorder: Secondary | ICD-10-CM

## 2018-01-27 MED ORDER — ALPRAZOLAM 0.5 MG PO TABS: 0.5000 mg | ORAL_TABLET | Freq: Two times a day (BID) | ORAL | 0 refills | Status: DC | PRN

## 2018-01-27 NOTE — Progress Notes (Addendum)
   Subjective:   Patient ID: [name]    DOB: 1966/04/23, 52 y.o. female   MRN: [id number]  [name] is a 52 y.o. female with a history of anxiety, COPD, CHF, CAD here for   Anxiety - Medications: Zoloft 100mg , Xanax 0.5mg  once PRN (last refilled 12/30/2017) - Taking Xanax sometimes BID, feels more effect with BID but ran out 3 days ago. - Current stressors: everything - Coping Mechanisms: hasn't wanted to try anything, debilitated with anxiety. Not cooking or playing with cats as discussed at last visit. - hasn't wanted to do anything, not cooking or playing with cats. States she is driving her boyfriend crazy.  Review of Systems:  Per HPI.   Burna: reviewed. Smoking status reviewed. Medications reviewed.  Objective:   BP 122/86 (BP Location: Right Arm, Patient Position: Sitting, Cuff Size: Normal)   Pulse (!) 101   Temp 97.8 F (36.6 C) (Oral)   Ht 5\' 5"  (1.651 m)   Wt 149 lb (67.6 kg)   SpO2 99%   BMI 24.79 kg/m  Vitals and nursing note reviewed.  General: well nourished, well developed, uncomfortable and in moderate acute distress, frequent picking at fingers and fidgeting in her seat, wiping her forehead. CV: regular rate and rhythm without murmurs Lungs: clear to auscultation bilaterally with normal work of breathing during interview. Coughing with auscultation and tachypnea during exam. Skin: warm, dry. Bleeding excoriations noted at nail beds Extremities: warm and well perfused, normal tone Neuro: Alert and oriented, speech normal.  Psych: scattered thought pattern with frequent fidgeting and picking, mood stable.  GAD 7 : Generalized Anxiety Score 01/27/2018 10/21/2017 09/08/2017 09/08/2017  Nervous, Anxious, on Edge 3 1 2 2   Control/stop worrying 3 2 2 2   Worry too much - different things 3 2 1 1   Trouble relaxing 3 1 0 0  Restless 3 1 1 1   Easily annoyed or irritable 3 3 1 1   Afraid - awful might happen 2 0 0 0  Total GAD 7 Score 20 10 7 7   Anxiety  Difficulty Extremely difficult Not difficult at all Not difficult at all Not difficult at all    Assessment & Plan:   Generalized anxiety disorder Not well controlled currently. Recent decrease in Xanax dose in attempt to wean, however is running out of pills prior to appointments and noticing significant distress. Occasionally is taking one pill once daily (total lower dose than before) but will sometimes take BID which is equivalent to prior dose. Given significant anxiety on exam and debilitating nature, believe medication adjustment is warranted. Discussed options with patient who elected to keep the same dose but provide increased number of pills to have the option to take one or two as needed. Refill given, continue zoloft. F/u in 3 weeks.  No orders of the defined types were placed in this encounter.  Meds ordered this encounter  Medications  . ALPRAZolam (XANAX) 0.5 MG tablet    Sig: Take 1 tablet (0.5 mg total) by mouth 2 (two) times daily as needed for up to 1 dose for anxiety.    Dispense:  40 tablet    Refill:  0    Rory Percy, DO PGY-1, [facility] 01/27/2018 4:24 PM

## 2018-01-27 NOTE — Patient Instructions (Signed)
It was great to see you!  For your anxiety,  - I am so sorry you are not tolerating the decrease in your Xanax dose. Per our conversation, we will keep the Xanax dose the same but provide more pills so you are not running out prior to our visit. - I would like to see you back in a few weeks to see how you are doing. - Certainly if you have concerns, please come back sooner.  Best, Dr. [facility]

## 2018-01-27 NOTE — Assessment & Plan Note (Addendum)
Not well controlled currently. Recent decrease in Xanax dose in attempt to wean, however is running out of pills prior to appointments and noticing significant distress. Occasionally is taking one pill once daily (total lower dose than before) but will sometimes take BID which is equivalent to prior dose. Given significant anxiety on exam and debilitating nature, believe medication adjustment is warranted. Discussed options with patient who elected to keep the same dose but provide increased number of pills to have the option to take one or two as needed. Refill given, continue zoloft. F/u in 3 weeks.

## 2018-02-09 ENCOUNTER — Telehealth: Payer: Self-pay | Admitting: Internal Medicine

## 2018-02-09 MED ORDER — AMOXICILLIN-POT CLAVULANATE 875-125 MG PO TABS: 1.0000 | ORAL_TABLET | Freq: Two times a day (BID) | ORAL | 0 refills | Status: DC

## 2018-02-09 NOTE — Telephone Encounter (Signed)
Spoke with the pt  She is c/o sweats, low grade temp- 100 and cough with green sputum x 5 days  She denies any chest tightness, SOB, wheezing  She declined ov  Please advise thanks!  No Known Allergies Current Outpatient Medications on File Prior to Visit  Medication Sig Dispense Refill  . Albuterol Sulfate (PROAIR RESPICLICK) 510 (90 Base) MCG/ACT AEPB Inhale 2 puffs into the lungs every 6 (six) hours as needed. 3 each 3  . ALPRAZolam (XANAX) 0.5 MG tablet Take 1 tablet (0.5 mg total) by mouth 2 (two) times daily as needed for up to 1 dose for anxiety. 40 tablet 0  . atorvastatin (LIPITOR) 40 MG tablet Take 1 tablet (40 mg total) by mouth daily at 6 PM. 90 tablet 3  . carvedilol (COREG) 6.25 MG tablet Take 1 tablet (6.25 mg total) by mouth 2 (two) times daily with a meal. 180 tablet 3  . chlorhexidine (PERIDEX) 0.12 % solution Use 92ml, swish in mouth undiluted for 30 seconds, then spit out. Use after breakfast and before bedtime 473/30=16  0  . chlorpheniramine-HYDROcodone (TUSSIONEX PENNKINETIC ER) 10-8 MG/5ML SUER Take 5 mLs by mouth every 12 (twelve) hours as needed for cough. (Patient not taking: Reported on 01/14/2018) 115 mL 0  . fluticasone furoate-vilanterol (BREO ELLIPTA) 100-25 MCG/INH AEPB Inhale 1 puff into the lungs daily. 180 each 3  . furosemide (LASIX) 40 MG tablet TAKE 1 TABLET( 40 MG TOTAL) BY MOUTH DAILY AS NEEDED FOR INCREASED SWELLING OR WEIGH TGAIN MORE THAN 2 LB 90 tablet 3  . HYDROcodone-homatropine (HYCODAN) 5-1.5 MG/5ML syrup Take 5 mLs by mouth every 8 (eight) hours as needed for cough. (Patient not taking: Reported on 01/14/2018) 120 mL 0  . ipratropium-albuterol (DUONEB) 0.5-2.5 (3) MG/3ML SOLN USE 1 VIAL VIA NEBULIZER EVERY 6 HOURS AS NEEDED 1080 mL 0  . losartan (COZAAR) 25 MG tablet Take 1 tablet (25 mg total) by mouth daily. 90 tablet 3  . nitroGLYCERIN (NITROSTAT) 0.4 MG SL tablet Place 1 tablet (0.4 mg total) under the tongue every 5 (five) minutes as needed for  chest pain. 30 tablet 12  . polyethylene glycol powder (GLYCOLAX/MIRALAX) powder Take 17 g by mouth 2 (two) times daily as needed. 225 g 5  . sertraline (ZOLOFT) 100 MG tablet Take 1.5 tablets (150 mg total) by mouth daily. 135 tablet 2  . spironolactone (ALDACTONE) 25 MG tablet Take 1 tablet (25 mg total) by mouth daily. 90 tablet 3  . traMADol (ULTRAM) 50 MG tablet Take 1 or 2 tablets every 8 hours as needed for cough 30 tablet 0  . zolpidem (AMBIEN) 5 MG tablet 1 at bedtime for sleep as needed 30 tablet 5   No current facility-administered medications on file prior to visit.

## 2018-02-09 NOTE — Telephone Encounter (Signed)
Spoke with the pt and notified of recs per CDY  Rx was sent to pharm  She will call if not improving

## 2018-02-09 NOTE — Telephone Encounter (Signed)
Offer augmentin 875   # 14, 1 twice daily 

## 2018-02-11 ENCOUNTER — Other Ambulatory Visit: Payer: Self-pay | Admitting: Oncology

## 2018-02-11 NOTE — Progress Notes (Signed)
Subjective:   Patient ID: [name]    DOB: 02/12/1966, 52 y.o. female   MRN: [id number]  ANIDA DEOL is a 52 y.o. female with a history of GAD, OCD, CAD, CHF, COPD here for   Anxiety - Medications: Zoloft 150mg , Xanax 0.5mg  1-2 tablets PRN - at last visit was not doing well with decreased Xanax dose, prescribed more pill # with decreased dose. - Taking: Xanax BID 0.5mg  mostly twice daily, - since last visit "feels like doing things she enjoys" more. Enjoys playing with her cats. Has not been picking at her hands as much.  Cough - called Pulm office 6/10 for productive cough x5 days, received augmentin x14 days. - persistent symptoms. No increased need for supplemental oxygen, currently only using at night. No high fevers. Was using Mucinex until received antibiotic. Doesn't feel she is coughing up sputum as much.  Review of Systems:  Per HPI.   [place], medications and smoking status reviewed.  Objective:   BP 126/80 (BP Location: Right Arm, Patient Position: Sitting, Cuff Size: Normal)   Pulse (!) 106   Temp 98.1 F (36.7 C) (Oral)   Resp 16   Wt 148 lb 6.4 oz (67.3 kg)   SpO2 90%   BMI 24.70 kg/m  Vitals and nursing note reviewed.  General: well nourished, well developed, in no acute distress, tired appearing with non-toxic appearance HEENT: normocephalic, atraumatic, moist mucous membranes CV: regular rate and rhythm without murmurs, rubs, or gallops Lungs: clear to auscultation bilaterally with normal work of breathing. Few crackles heard in RUL, diminished at bases (at baseline).  Skin: warm, dry. Healing excoriation on hands, improved from last visit. Extremities: warm and well perfused, normal tone MSK: ROM grossly intact, strength intact, gait normal Neuro: Alert and oriented, speech normal  GAD 7 : Generalized Anxiety Score 02/12/2018 01/27/2018 10/21/2017 09/08/2017  Nervous, Anxious, on Edge 1 3 1 2   Control/stop worrying 1 3 2 2   Worry too much -  different things 1 3 2 1   Trouble relaxing 2 3 1  0  Restless 2 3 1 1   Easily annoyed or irritable 3 3 3 1   Afraid - awful might happen 1 2 0 0  Total GAD 7 Score 11 20 10 7   Anxiety Difficulty Very difficult Extremely difficult Not difficult at all Not difficult at all    Assessment & Plan:   Generalized anxiety disorder Doing much better since providing increased pill # however using Xanax BID consistently. Encouraged patient to use only once daily and using additional one only when she feels she needs it with the aim to have half of her days only using one pill. Patient agreeable. Refill given. Continue Zoloft. Can consider starting Buspar for better control at follow up if patient continues to require additional Xanax.  Cough Received Augmentin with Pulm. States sputum production has decreased with persistent cough. Encouraged patient to use Mucinex with antibiotic if she feels she is needing to cough up sputum. Encouraged continued supportive care. SpO2 90% today, advised patient she can use her supplemental O2 during the day as well if she is feeling short of breath.  No orders of the defined types were placed in this encounter.  Meds ordered this encounter  Medications  . ALPRAZolam (XANAX) 0.5 MG tablet    Sig: Take 1 tablet (0.5 mg total) by mouth 2 (two) times daily as needed for up to 1 dose for anxiety.    Dispense:  40 tablet  Refill:  0    Rory Percy, DO PGY-1, [facility] 02/12/2018 11:48 AM

## 2018-02-12 ENCOUNTER — Telehealth: Payer: Self-pay | Admitting: *Deleted

## 2018-02-12 ENCOUNTER — Encounter: Payer: Self-pay | Admitting: Family Medicine

## 2018-02-12 ENCOUNTER — Ambulatory Visit (INDEPENDENT_AMBULATORY_CARE_PROVIDER_SITE_OTHER): Payer: Medicare Other | Admitting: Family Medicine

## 2018-02-12 VITALS — BP 126/80 | HR 106 | Temp 98.1°F | Resp 16 | Wt 148.4 lb

## 2018-02-12 DIAGNOSIS — R05 Cough: Secondary | ICD-10-CM

## 2018-02-12 DIAGNOSIS — F411 Generalized anxiety disorder: Secondary | ICD-10-CM | POA: Diagnosis not present

## 2018-02-12 DIAGNOSIS — R059 Cough, unspecified: Secondary | ICD-10-CM

## 2018-02-12 MED ORDER — ALPRAZOLAM 0.5 MG PO TABS: 0.5000 mg | ORAL_TABLET | Freq: Two times a day (BID) | ORAL | 0 refills | Status: AC | PRN

## 2018-02-12 NOTE — Telephone Encounter (Signed)
Spoke with patient, clarified dosing. See progress note from today for details.

## 2018-02-12 NOTE — Telephone Encounter (Signed)
Pt calls nurse line.  She thought that even though Dr. Ky Barban does not want her to take 2 pills a day, she was going to give her enough if she needed to.  Explained to pt that she did give her extra but not enough to take 2 daily as that is not was her and the provider discussed.   Pt still explains that she thought she was getting #60.  She would like to verify and possibly speak with the provider. Fleeger, Salome Spotted, CMA

## 2018-02-12 NOTE — Assessment & Plan Note (Signed)
Received Augmentin with Pulm. States sputum production has decreased with persistent cough. Encouraged patient to use Mucinex with antibiotic if she feels she is needing to cough up sputum. Encouraged continued supportive care. SpO2 90% today, advised patient she can use her supplemental O2 during the day as well if she is feeling short of breath.

## 2018-02-12 NOTE — Patient Instructions (Signed)
It was great to see you!  Our plans for today:  - I am refilling your Xanax, be sure not to pick this up until you have one day supply left. - Try to take only when absolutely needed. Aim for one tablet once daily but if you need a second, then take it. - Follow up in 1 month. - You can take Mucinex with your antibiotic for your cough. - Be sure to get plenty of rest and drink lots of water.  Take care and seek immediate care sooner if you develop any concerns.   Dr. [facility]

## 2018-02-12 NOTE — Assessment & Plan Note (Addendum)
Doing much better since providing increased pill # however using Xanax BID consistently. Encouraged patient to use only once daily and using additional one only when she feels she needs it with the aim to have half of her days only using one pill. Patient agreeable. Refill given. Continue Zoloft. Can consider starting Buspar for better control at follow up if patient continues to require additional Xanax.

## 2018-03-11 ENCOUNTER — Other Ambulatory Visit: Payer: Self-pay | Admitting: Internal Medicine

## 2018-03-11 ENCOUNTER — Encounter: Payer: Self-pay | Admitting: Internal Medicine

## 2018-03-12 MED ORDER — ZOLPIDEM TARTRATE 5 MG PO TABS: ORAL_TABLET | ORAL | 5 refills | Status: DC

## 2018-03-12 NOTE — Addendum Note (Signed)
Addended by: Desmond Dike C on: 03/12/2018 11:46 AM   Modules accepted: Orders

## 2018-03-12 NOTE — Telephone Encounter (Signed)
OK to refill ambien x 6 months total

## 2018-03-16 ENCOUNTER — Ambulatory Visit: Payer: Medicare Other | Admitting: Family Medicine

## 2018-04-08 ENCOUNTER — Other Ambulatory Visit: Payer: Self-pay | Admitting: Internal Medicine

## 2018-04-09 DIAGNOSIS — J4541 Moderate persistent asthma with (acute) exacerbation: Secondary | ICD-10-CM

## 2018-04-09 MED ORDER — ALBUTEROL SULFATE 108 (90 BASE) MCG/ACT IN AEPB: 2.0000 | INHALATION_SPRAY | Freq: Four times a day (QID) | RESPIRATORY_TRACT | 3 refills | Status: DC | PRN

## 2018-04-10 NOTE — Telephone Encounter (Signed)
Patient called back and said she received her prescription for [facility] and it should be a 90 day supply. Also Pro Air is also a 90 day supply. Pt uses [facility] on [address] is [phone]. Pt is at the pharmacy at the moment.

## 2018-04-10 NOTE — Telephone Encounter (Signed)
Proair sent yesterday was for 90 day supply. Breo sent today was for a 30 day supply.  Called [facility] and spoke with pharmacist [facility] Rx has not yet been picked up and was verbally changed to a 90 day supply with 1 refill.  Left detailed message on VM informing patient of the above (dpr on file) and e-mail sent as well.  Nothing further needed; will sign off.

## 2018-06-03 ENCOUNTER — Other Ambulatory Visit: Payer: Self-pay | Admitting: Oncology

## 2018-06-03 DIAGNOSIS — Z1231 Encounter for screening mammogram for malignant neoplasm of breast: Secondary | ICD-10-CM

## 2018-06-09 ENCOUNTER — Other Ambulatory Visit: Payer: Self-pay | Admitting: Family Medicine

## 2018-06-19 ENCOUNTER — Other Ambulatory Visit: Payer: Self-pay | Admitting: Internal Medicine

## 2018-06-19 NOTE — Telephone Encounter (Signed)
CY Please advise on refill. Thanks.  

## 2018-06-19 NOTE — Telephone Encounter (Signed)
Ok to refill augmentin

## 2018-06-23 ENCOUNTER — Inpatient Hospital Stay: Payer: Medicare Other | Attending: Oncology | Admitting: Oncology

## 2018-06-23 ENCOUNTER — Encounter: Payer: Self-pay | Admitting: Oncology

## 2018-06-23 ENCOUNTER — Telehealth: Payer: Self-pay | Admitting: Oncology

## 2018-06-23 VITALS — BP 96/65 | HR 88 | Temp 97.6°F | Resp 18 | Ht 65.0 in | Wt 164.0 lb

## 2018-06-23 DIAGNOSIS — J449 Chronic obstructive pulmonary disease, unspecified: Secondary | ICD-10-CM | POA: Diagnosis not present

## 2018-06-23 DIAGNOSIS — C182 Malignant neoplasm of ascending colon: Secondary | ICD-10-CM | POA: Diagnosis not present

## 2018-06-23 DIAGNOSIS — Z23 Encounter for immunization: Secondary | ICD-10-CM | POA: Diagnosis not present

## 2018-06-23 DIAGNOSIS — Z85118 Personal history of other malignant neoplasm of bronchus and lung: Secondary | ICD-10-CM | POA: Diagnosis not present

## 2018-06-23 DIAGNOSIS — Z87891 Personal history of nicotine dependence: Secondary | ICD-10-CM | POA: Diagnosis not present

## 2018-06-23 DIAGNOSIS — I2510 Atherosclerotic heart disease of native coronary artery without angina pectoris: Secondary | ICD-10-CM | POA: Diagnosis not present

## 2018-06-23 DIAGNOSIS — C189 Malignant neoplasm of colon, unspecified: Secondary | ICD-10-CM

## 2018-06-23 DIAGNOSIS — C4371 Malignant melanoma of right lower limb, including hip: Secondary | ICD-10-CM | POA: Diagnosis present

## 2018-06-23 DIAGNOSIS — Z79899 Other long term (current) drug therapy: Secondary | ICD-10-CM | POA: Insufficient documentation

## 2018-06-23 DIAGNOSIS — C3491 Malignant neoplasm of unspecified part of right bronchus or lung: Secondary | ICD-10-CM

## 2018-06-23 MED ORDER — INFLUENZA VAC SPLIT QUAD 0.5 ML IM SUSY
PREFILLED_SYRINGE | INTRAMUSCULAR | Status: AC
  Filled 2018-06-23: qty 0.5

## 2018-06-23 MED ORDER — INFLUENZA VAC SPLIT QUAD 0.5 ML IM SUSY
0.5000 mL | PREFILLED_SYRINGE | Freq: Once | INTRAMUSCULAR | Status: AC
  Administered 2018-06-23: 0.5 mL via INTRAMUSCULAR

## 2018-06-23 NOTE — Telephone Encounter (Signed)
Gave avs and calendar ° °

## 2018-06-23 NOTE — Progress Notes (Signed)
[place]  Telephone:(336) [phone] Fax:(336) [phone]     ID: [name] DOB: March 03, 1966  MR#: [id number]  CSN#:[id number]  Patient Care Team: Leanna Battles, MD as PCP - General (Internal Medicine) Deneise Lever, MD (Pulmonary Disease) Harriett Sine, MD as Consulting Physician (Dermatology) Armbruster, Carlota Raspberry, MD as Consulting Physician (Gastroenterology) Jaylyn Iyer, Virgie Dad, MD as Consulting Physician (Oncology) PCP: Leanna Battles, MD GYN: SU:  OTHER MD: Griselda Miner M.D., Rob Hickman M.D., Georganna Skeans MD, Harriett Sine MD  CHIEF COMPLAINT: malignant melanoma, colon carcinoma, lung cancer  CURRENT TREATMENT: Observation   HISTORY OF PRESENT ILLNESS:  From the prior summary:  I know [name] from [name] remote history of lung carcinoma, which has not recurred. More recently, [name] significant other [name] noticed a lesion in [name] right posterior calf. He keeps a picture Atlas of melanoma lesions on his bathroom wall, and told [name] he was "99% sure" the lesion there would be a melanoma. The patient brought it to Dr. Marjean Donna attention and a punch biopsy was obtained 09/06/2014. This showed ([id number]) malignant melanoma, with a maximum thickness of 0.33 mm, anatomic level III, no ulceration, with negative deep margins but clear peripheral margins. The mitotic index was low, less than one per square millimeter. There was no evidence of lymphovascular invasion. There was brisk tumor infiltrating lymphocytes. Tumor regression was noted across the full aspect of the punch biopsy.   This was read as a pathologic stage TIa, and the patient was referred to Dr. Sarajane Jews for wide excision, performed 09/13/2014. Dr. Everett Graff note indicates a 1 cm margin around the initial biopsy site. The final pathology ([id number]) showed a tumor thickness of 1.15, anatomic level III, with ulceration seen only in the area of prior biopsy, (and therefore read  as negative). Peripheral as well as deep margins were free. Again the mitotic index was low there was no evidence of vascular invasion and tumor infiltrating lymphocytes were brisk. This was read as a pT2a lesion.   The patient is referred for further evaluation and treatment.  INTERVAL HISTORY: [name] returns today for follow-up of [name] remote lung cancer, and [name] more recent colon and melanoma cancers, as well as for routine health maintenance.    Since [name] last visit to the office, she underwent CT Chest wo contrast on 12/16/2017 showed: Stable postoperative and post radiation changes are noted in the right upper lobe. Stable reticular densities are noted throughout both lungs most consistent with chronic interstitial lung disease or scarring. No significant pulmonary nodule or mass is noted. Aortic atherosclerosis and emphysema.   She is scheduled for a screening bilateral mammogram on 07/03/2018.   Recall she underwent cardiac catheterization on 05/12/2017 with the placement of a Sierra 15 mm drug-eluting stent 4 and ostial left main lesion.  She continues on antiplatelet agents, with some bruising but no overt bleeding   REVIEW OF SYSTEMS: [name] reports she is doing well overall. She denies any easy bruising, bloody nose, and black or bloody stools.  She is already scheduled for mammography in November 2019. She denies unusual headaches, visual changes, nausea, vomiting, or dizziness. There has been no unusual cough, phlegm production, or pleurisy. This been no change in bowel or bladder habits. She denies unexplained fatigue or unexplained weight loss, bleeding, rash, or fever. A detailed review of systems was otherwise stable.    PAST MEDICAL HISTORY: Past Medical History:  Diagnosis Date  . Acute bilateral low back pain without sciatica   .  Acute congestive heart failure ([place]) 05/02/2017   EF 20-25% on ECHO  . Acute respiratory failure with hypoxia ([place]) 05/01/2017  . Adhesive  capsulitis 05/22/2011  . Allergic rhinitis   . Allergy   . Anxiety disorder   . Aortic valve regurgitation 05/15/2017  . Asthma   . Chronic bronchitis   . Colon cancer ([place])    adenocarcinoma in a polyps 11-24-2014  . COPD (chronic obstructive pulmonary disease) ([place])   . Cough   . Dizziness and giddiness   . Dyspepsia   . Dyspnea 04/30/2017  . Emphysema of lung ([place])   . External hemorrhoids   . EXTERNAL HEMORRHOIDS 09/27/2009  . Female stress incontinence   . Genetic testing 08/18/2016   Negative for known pathogenic mutations within any of 25 genes on a Custom Panel through [employer].  One variant of uncertain significance (VUS) called "c.7187C>G (p.Thr2396Ser)" was found in one copy of the ATM gene.  This Custom Cancer Panel offered by [employer] includes sequencing and/or duplication/deletion testing of the following 25 genes: APC, ATM, AXIN2, BAP1, BMPR1A, BRCA1, BRCA2, CDH1, CDK4, CDKN2A, CHEK2, EPCAM, MITF, MLH1, MSH2, MSH6, MUTYH, PMS2, POLD1, POLE, PTEN, SCG5/GREM1, SMAD4, STK11, and TP53. Date of report is July 23, 2016.  MSH2 Exons 1-7 Inversion Analysis was also negative through [employer].  Date of report is July 23, 2016.     Marland Kitchen GERD (gastroesophageal reflux disease)    occ depending on diet   . Goiter   . History of lung cancer   . Hypercholesteremia    denies-last check normal labs  . Hypokalemia   . 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 Insomnia   . Lung cancer ([place])   . Migraine, unspecified, without mention of intractable migraine without mention of status migrainosus   . Panic attacks   . Seizures ([place])    very long ago like 15 years ago or so , questionable etilogy  . Skin cancer (melanoma) ([place])    right calf  . Snoring 12/11/2010   Longstanding hx of snore. NPSG was normal years ago.    Marland Kitchen Swelling of right lower extremity 12/01/2014    PAST SURGICAL HISTORY: Past Surgical History:  Procedure Laterality Date  .  ABDOMINAL HYSTERECTOMY  2001   no BSO  . biopsy of right neck mass     negative-thyroid biopsy   . BREAST EXCISIONAL BIOPSY Right 1987   no visible scar  . BRONCHOSCOPY  01/31/2005   nonspecific inflammation  . BRONCHOSCOPY  08/24/2010   nonspecific inflammation  . COLONOSCOPY    . CORONARY STENT INTERVENTION N/A 05/12/2017   Procedure: CORONARY STENT INTERVENTION;  Surgeon: Charolette Forward, MD;  Location: [facility];  Service: Cardiovascular;  Laterality: N/A;  . LUNG REMOVAL, PARTIAL     for lung cancer--followed by Dr. Arlyce Dice and Dr. Annamaria Boots  . MELANOMA EXCISION WITH SENTINEL LYMPH NODE BIOPSY Right 11/29/2014   Procedure: RIGHT INGUINAL SENTINEL LYMPH NODE BIOPSY;  Surgeon: Georganna Skeans, MD;  Location: [facility];  Service: General;  Laterality: Right;  . melanoma removal     right calf   . POLYPECTOMY    . portacath removed  10/2007  . RIGHT/LEFT HEART CATH AND CORONARY ANGIOGRAPHY N/A 05/06/2017   Procedure: RIGHT/LEFT HEART CATH AND CORONARY ANGIOGRAPHY;  Surgeon: [name], MD;  Location: [facility];  Service: Cardiovascular;  Laterality: N/A;  . SPIROMETRY  07/03/2002   min obstruction,mild restriction 01/31/2005  . tvh  12/01/2000  hysterectomy    FAMILY HISTORY Family History  Problem Relation Age of Onset  . Lung cancer Father        smoker and worked at a [employer]  . COPD Mother        not a smoker  . Other Mother        hx of hysterectomy for unspecified reason  . Asthma Daughter   . Diabetes Maternal Grandfather   . Colon polyps Brother        approx 16 polyps on his first colonoscopy  . Other Maternal Aunt        non-cancerous growth in lungs; respiratory issues; smoker  . COPD Maternal Grandmother        d. 75  . Cancer Paternal Grandfather        oral/mouth cancer; chewed tobacco; d. older age  . Throat cancer Other        maternal great aunt (MGF's sister); not a smoker  . Cirrhosis Paternal Uncle        hx of alcohol abuse   . Colon  cancer Neg Hx   . Rectal cancer Neg Hx   . Stomach cancer Neg Hx    the patient's father died in his mid 17s from lung cancer. The patient's mother is living, in [name] 17s. The patient has one brother, no sisters. There is no other history of cancer in the family to [name] knowledge  GYNECOLOGIC HISTORY:  No LMP recorded. Patient has had a hysterectomy. Menarche age 2, first live birth age 1. The patient is GX P1. She had a hysterectomy approximately 2001. She did not undergo salpingo-oophorectomy. She did not take hormone replacement  SOCIAL HISTORY:  She is disabled and lives by herself, with a cat. She is divorced. [name] significant other, [name], works as a Chief Strategy Officer. [name] daughter [name], 97 years old as of July 2019, lives in [place] and in addition to working has 5 children and one on the way.    ADVANCED DIRECTIVES: Not in place. The patient intends to name [name] mother [name] as healthcare power of attorney. She can be reached at [phone]. At the 11/07/2014 visit patient received the appropriate documents 2 complete and notarize at [name] discretion   HEALTH MAINTENANCE: Social History   Tobacco Use  . Smoking status: Former Smoker    Packs/day: 2.00    Years: 23.00    Pack years: 46.00    Types: Cigarettes    Start date: 09/02/1978    Last attempt to quit: 10/26/2001    Years since quitting: 16.6  . Smokeless tobacco: Never Used  . Tobacco comment: Significant passive exposure from boy friend  Substance Use Topics  . Alcohol use: No    Alcohol/week: 0.0 standard drinks    Comment: history of alcohol abuse 20-30  . Drug use: No    Comment: +MJ    Mammography: Ending  Colonoscopy: 12/2016, normal (due 01/2020)  PAP: Status post hysterectomy  Bone density: never  Lipid profile: 06/16/2017, good  No Known Allergies  Current Outpatient Medications  Medication Sig Dispense Refill  . Albuterol Sulfate (PROAIR RESPICLICK) 517 (90 Base) MCG/ACT AEPB Inhale 2 puffs into the  lungs every 6 (six) hours as needed. 3 each 3  . ALPRAZolam (XANAX) 0.5 MG tablet Take 1 tablet (0.5 mg total) by mouth 2 (two) times daily as needed for up to 1 dose for anxiety. 40 tablet 0  . amoxicillin-clavulanate (AUGMENTIN) 875-125 MG tablet TAKE 1 TABLET BY MOUTH  TWICE DAILY 20 tablet 0  . atorvastatin (LIPITOR) 40 MG tablet Take 1 tablet (40 mg total) by mouth daily at 6 PM. 90 tablet 3  . BREO ELLIPTA 100-25 MCG/INH AEPB INHALE 1 PUFF BY MOUTH INTO THE LUNGS DAILY 1 each 0  . carvedilol (COREG) 6.25 MG tablet Take 1 tablet (6.25 mg total) by mouth 2 (two) times daily with a meal. 180 tablet 3  . chlorhexidine (PERIDEX) 0.12 % solution Use 83m, swish in mouth undiluted for 30 seconds, then spit out. Use after breakfast and before bedtime 473/30=16  0  . chlorpheniramine-HYDROcodone (TUSSIONEX PENNKINETIC ER) 10-8 MG/5ML SUER Take 5 mLs by mouth every 12 (twelve) hours as needed for cough. (Patient not taking: Reported on 01/14/2018) 115 mL 0  . clopidogrel (PLAVIX) 75 MG tablet TAKE 1 TABLET(75 MG) BY MOUTH DAILY 90 tablet 3  . furosemide (LASIX) 40 MG tablet TAKE 1 TABLET( 40 MG TOTAL) BY MOUTH DAILY AS NEEDED FOR INCREASED SWELLING OR WEIGH TGAIN MORE THAN 2 LB (Patient not taking: Reported on 02/12/2018) 90 tablet 3  . HYDROcodone-homatropine (HYCODAN) 5-1.5 MG/5ML syrup Take 5 mLs by mouth every 8 (eight) hours as needed for cough. (Patient not taking: Reported on 01/14/2018) 120 mL 0  . ipratropium-albuterol (DUONEB) 0.5-2.5 (3) MG/3ML SOLN USE 1 VIAL VIA NEBULIZER EVERY 6 HOURS AS NEEDED (Patient not taking: Reported on 02/12/2018) 1080 mL 0  . losartan (COZAAR) 25 MG tablet Take 1 tablet (25 mg total) by mouth daily. 90 tablet 3  . nitroGLYCERIN (NITROSTAT) 0.4 MG SL tablet Place 1 tablet (0.4 mg total) under the tongue every 5 (five) minutes as needed for chest pain. (Patient not taking: Reported on 02/12/2018) 30 tablet 12  . polyethylene glycol powder (GLYCOLAX/MIRALAX) powder Take 17 g  by mouth 2 (two) times daily as needed. (Patient not taking: Reported on 02/12/2018) 225 g 5  . sertraline (ZOLOFT) 100 MG tablet Take 1.5 tablets (150 mg total) by mouth daily. 135 tablet 2  . spironolactone (ALDACTONE) 25 MG tablet Take 1 tablet (25 mg total) by mouth daily. 90 tablet 3  . traMADol (ULTRAM) 50 MG tablet Take 1 or 2 tablets every 8 hours as needed for cough (Patient not taking: Reported on 02/12/2018) 30 tablet 0  . zolpidem (AMBIEN) 5 MG tablet 1 at bedtime for sleep as needed 30 tablet 5   Current Facility-Administered Medications  Medication Dose Route Frequency Provider Last Rate Last Dose  . Influenza vac split quadrivalent PF (FLUARIX) injection 0.5 mL  0.5 mL Intramuscular Once Andreanna Mikolajczak, GVirgie Dad MD        OBJECTIVE: Middle-aged white woman who appears well  Vitals:   06/23/18 1244  BP: 96/65  Pulse: 88  Resp: 18  Temp: 97.6 F (36.4 C)  SpO2: 95%     Body mass index is 27.29 kg/m.    ECOG FS:1 - Symptomatic but completely ambulatory  Sclerae unicteric, EOMs intact No cervical or supraclavicular adenopathy Lungs no rales or rhonchi Heart regular rate and rhythm Abd soft, nontender, positive bowel sounds MSK no focal spinal tenderness, no upper extremity lymphedema Neuro: nonfocal, well oriented, appropriate affect Breasts: No skin or nipple changes of concern.  No palpable masses.  Both axillae are benign.      LAB RESULTS:  CMP     Component Value Date/Time   NA 140 12/05/2017 1459   NA 140 06/20/2017 1234   K 4.5 12/05/2017 1459   K 3.6 06/20/2017 1234   CL 105  12/05/2017 1459   CL 111 (H) 07/09/2012 0958   CO2 20 12/05/2017 1459   CO2 26 06/20/2017 1234   GLUCOSE 78 12/05/2017 1459   GLUCOSE 86 07/17/2017 1129   GLUCOSE 91 06/20/2017 1234   GLUCOSE 80 07/09/2012 0958   BUN 12 12/05/2017 1459   BUN 9.8 06/20/2017 1234   CREATININE 0.80 12/05/2017 1459   CREATININE 0.8 06/20/2017 1234   CALCIUM 9.2 12/05/2017 1459   CALCIUM 9.4  06/20/2017 1234   PROT 8.0 06/20/2017 1234   ALBUMIN 4.2 06/20/2017 1234   AST 15 06/20/2017 1234   ALT 9 06/20/2017 1234   ALKPHOS 95 06/20/2017 1234   BILITOT 1.04 06/20/2017 1234   GFRNONAA 84 12/05/2017 1459   GFRAA 97 12/05/2017 1459    INo results found for: SPEP, UPEP  Lab Results  Component Value Date   WBC 7.6 06/20/2017   NEUTROABS 5.4 06/20/2017   HGB 13.3 06/20/2017   HCT 38.3 06/20/2017   MCV 84.9 06/20/2017   PLT 223 06/20/2017      Chemistry      Component Value Date/Time   NA 140 12/05/2017 1459   NA 140 06/20/2017 1234   K 4.5 12/05/2017 1459   K 3.6 06/20/2017 1234   CL 105 12/05/2017 1459   CL 111 (H) 07/09/2012 0958   CO2 20 12/05/2017 1459   CO2 26 06/20/2017 1234   BUN 12 12/05/2017 1459   BUN 9.8 06/20/2017 1234   CREATININE 0.80 12/05/2017 1459   CREATININE 0.8 06/20/2017 1234      Component Value Date/Time   CALCIUM 9.2 12/05/2017 1459   CALCIUM 9.4 06/20/2017 1234   ALKPHOS 95 06/20/2017 1234   AST 15 06/20/2017 1234   ALT 9 06/20/2017 1234   BILITOT 1.04 06/20/2017 1234       Lab Results  Component Value Date   LABCA2 26 07/21/2007    No components found for: [id number]  No results for input(s): INR in the last 168 hours.  Urinalysis    Component Value Date/Time   LABSPEC 1.005 07/16/2012 1207   PHURINE 6.0 07/16/2012 1207   HGBUR Negative 07/16/2012 1207   BILIRUBINUR Negative 07/16/2012 1207   KETONESUR Negative 07/16/2012 1207   PROTEINUR Negative 07/16/2012 1207   NITRITE Negative 07/16/2012 1207   LEUKOCYTESUR Trace 07/16/2012 1207    STUDIES: CT Chest wo contrast on 2017-12-27 showed: Stable postoperative and post radiation changes are noted in the right upper lobe. Stable reticular densities are noted throughout both lungs most consistent with chronic interstitial lung disease or scarring. No significant pulmonary nodule or mass is noted. Aortic atherosclerosis and emphysema.   No results found.  ASSESSMENT:  52 y.o. [name] woman  A: LUNG CANCER  (1) status post right upper lobe wedge resection of a T1 N2, stage IIIA non-small cell lung cancer (adenocarcinoma) measuring 1.8 cm, poorly differentiated, with negative margins, but with 4 positive lymph nodes, 3 N1 and 1 N2;   (a) on [place] 2004 to 2009  (b) no evidence of disease recurrence on most recent scans, and likely cured  (2) ashthmatic bronchitis/ COPD  (3) tobacco abuse: The patient quit smoking 2001  B: MALIGNANT MELANOMA  (4) s/p punch biopsy Right posterior calf for malignant melanoma 0.33 mm thickness, level III, w/o ulceration and with a low mitotic index, free deep but positive lateral margins  (5) status post wide excision 09/13/2014 showing a maximum tumor thickness of 1.15 mm, and anatomic level III, with ulceration noted only  at the site of prior biopsy, with clear margins (1 cm per surgical note) with a low mitotic index and brisk infiltration by tumor infiltrating lymphocytes: pT2a NX MX = stage IB  (6) right inguinal sentinel lymph node biopsy 11/29/2014 showed no evidence of melanoma  C: COLON CANCER (7) colonoscopy 11/29/2014 shows adenocarcinoma in  ascending colon polyp; repeat colonoscopy 12/30/2014 removed a tubular adenoma in that area which was negative for malignancy. The area was marked for possible definitive surgery  (a) Repeat colonoscopy with polypectomy 12/30/2014 showed tubular adenomas.  D: GENETICS (8) Genetic testing 07/23/2016 through the [facility] Panel (with MSH2 Exons 1-7 Inversion Analysis) found no known deleterious mutations.in APC, ATM, AXIN2, BAP1, BMPR1A, BRCA1, BRCA2, CDH1, CDK4, CDKN2A, CHEK2, EPCAM, MITF, MLH1, MSH2, MSH6, MUTYH, PMS2, POLD1, POLE, PTEN, SCG5/GREM1, SMAD4, STK11, and TP53.   (a) One variant of uncertain significance (VUS) called "c.7187C>G (p.Thr2396Ser)" was found in one copy of the ATM gene  E: Coronary artery disease status post stenting September  2018     PLAN: [name] is now approximately 15 years out from [name] diagnosis of lung cancer, with no evidence of disease recurrence.  She is 3-1/2 years out from [name] diagnosis of colon cancer with no evidence of disease recurrence.  She is nearly 4 years out from [name] diagnosis of melanoma with no evidence of disease recurrence.  All this is very favorable.  She has adequate follow-up by Dr. Annamaria Boots for the lung, Dr. Elvera Lennox for the skin, and Dr. Havery Moros for the skin.  New problem of course is coronary artery disease and she follows up with Dr. Aundra Dubin regarding that.  She will have mammography next month.  She will see me again in 1 year.  We went ahead and gave [name] [name] flu shot today.  She knows to call for any other issues that may develop before the next visit.  [name], Virgie Dad, MD  06/23/18 1:14 PM Medical Oncology and Hematology [facility] [address] [place], [place] [address] Tel. [phone]    Fax. [phone]    I, Wilburn Mylar am acting as scribe for Dr. Virgie Dad. [name].  I, Lurline Del MD, have reviewed the above documentation for accuracy and completeness, and I agree with the above.

## 2018-06-29 ENCOUNTER — Other Ambulatory Visit: Payer: Self-pay | Admitting: Internal Medicine

## 2018-06-29 NOTE — Telephone Encounter (Signed)
CY Please advise on refill. Thanks.  

## 2018-06-29 NOTE — Telephone Encounter (Signed)
Ok to refill 

## 2018-07-03 ENCOUNTER — Ambulatory Visit
Admission: RE | Admit: 2018-07-03 | Discharge: 2018-07-03 | Disposition: A | Discharge status: Home / Self Care | Payer: Medicare Other | Source: Ambulatory Visit | Attending: Oncology | Admitting: Oncology

## 2018-07-03 DIAGNOSIS — Z1231 Encounter for screening mammogram for malignant neoplasm of breast: Secondary | ICD-10-CM

## 2018-07-07 ENCOUNTER — Other Ambulatory Visit: Payer: Self-pay | Admitting: Internal Medicine

## 2018-07-17 ENCOUNTER — Ambulatory Visit (INDEPENDENT_AMBULATORY_CARE_PROVIDER_SITE_OTHER): Payer: Medicare Other | Admitting: Internal Medicine

## 2018-07-17 ENCOUNTER — Encounter: Payer: Self-pay | Admitting: Internal Medicine

## 2018-07-17 VITALS — BP 114/70 | HR 89 | Ht 65.0 in | Wt 164.4 lb

## 2018-07-17 DIAGNOSIS — C3491 Malignant neoplasm of unspecified part of right bronchus or lung: Secondary | ICD-10-CM | POA: Diagnosis not present

## 2018-07-17 DIAGNOSIS — J449 Chronic obstructive pulmonary disease, unspecified: Secondary | ICD-10-CM | POA: Diagnosis not present

## 2018-07-17 DIAGNOSIS — J4541 Moderate persistent asthma with (acute) exacerbation: Secondary | ICD-10-CM | POA: Diagnosis not present

## 2018-07-17 DIAGNOSIS — J9611 Chronic respiratory failure with hypoxia: Secondary | ICD-10-CM | POA: Diagnosis not present

## 2018-07-17 MED ORDER — ZOLPIDEM TARTRATE 5 MG PO TABS: ORAL_TABLET | ORAL | 5 refills | Status: DC

## 2018-07-17 MED ORDER — IPRATROPIUM-ALBUTEROL 0.5-2.5 (3) MG/3ML IN SOLN: RESPIRATORY_TRACT | 12 refills | Status: DC

## 2018-07-17 MED ORDER — ALBUTEROL SULFATE 108 (90 BASE) MCG/ACT IN AEPB: 2.0000 | INHALATION_SPRAY | Freq: Four times a day (QID) | RESPIRATORY_TRACT | 3 refills | Status: DC | PRN

## 2018-07-17 MED ORDER — FLUTICASONE FUROATE-VILANTEROL 100-25 MCG/INH IN AEPB: INHALATION_SPRAY | RESPIRATORY_TRACT | 3 refills | Status: DC

## 2018-07-17 NOTE — Patient Instructions (Addendum)
Med refill scripts printed  Order- print script-  Replacement column pair for [employer] G3 oxygen concentrator  --  Try order thorough [facility]  Please call if we can help

## 2018-07-17 NOTE — Progress Notes (Signed)
Patient ID: [name], female    DOB: 1966/06/29, 52 y.o.   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 Walk test on room air 06/28/2016-95%, 95%, 95%, 93%, peak heart rate 120/minute. No desaturation after 3185 feet. Office Spirometry 06/28/2016-limited validity due to cough. Restriction of exhaled volume. FVC 1.79/50%, FEV1 1.64/57%, ratio 0.80. FENO- 5 Walk Test on room Air- Qualified for home O2 04/10/17 PFT 06/17/17- severe obstruction, mild restriction, diffusion severely reduced. Insignificant response to bronchodilator. FVC 1.73/47%, FEV1 1.10/38%, ratio 0.64, TLC 77%, DLCO 32% CHF- acute hosp 05/2017- CHF, CAD, EF 20-25%. Stent. 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  She only needs to continue her oxygen at 2 L for sleep as she is doing. So she no longer has productive cough.  Reports anxiety increased  by reduction of her Xanax which is being managed by her primary physician the [facility].  Imaging reviewed. Home Sleep Test 10/28/2017- AHI 3.2/hour, desaturation to 82% with average 88%.  345.8 minutes recorded with saturation less than/equal to 88% on room air.  Body weight 152 pounds CT chest 12/17/2017- IMPRESSION: Stable postoperative and post radiation changes are noted in the right upper lobe. Stable reticular densities are noted throughout both lungs most consistent with chronic interstitial lung disease or scarring. No significant pulmonary nodule or mass is noted. Aortic Atherosclerosis (ICD10-I70.0) and Emphysema (ICD10-J43.9).  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 Neb DuoNeb, Breo 540, ProAir Respiclick, Here with boyfriend Aaron Edelman She reports getting choked today on grits-hard coughing but okay now. Needs replacement for replacement of column pair and her Inogen G3 POC.  Review of Systems-+ = positive Constitutional:   No-   weight loss, night sweats, fevers, chills, fatigue, lassitude. HEENT:   No-  headaches, difficulty swallowing, tooth/dental problems, sore throat,      mild sneezing,no- itching, ear ache, nasal congestion, post nasal drip,  CV:  No-   chest pain, orthopnea, PND, swelling in lower extremities, anasarca, dizziness, palpitations Resp: + shortness of breath with exertion or at rest.              productive cough, + non-productive cough,  No- coughing up of blood.               change in color of mucus.  wheezing.   Skin: No-   rash or  lesions. GI:  No-   heartburn, indigestion, abdominal  pain, nausea, vomiting,  GU:   MS:  No-   joint pain or swelling.   Neuro-     nothing unusual Psych:  No- change in mood or affect. No depression or anxiety.  No memory loss.   Objective:   Physical Exam General- Alert, Oriented, Affect-appropriate, Distress - NAD  O2 2L   Sat 100% Skin- rash-none, lesions- none, excoriation- none.  Lymphadenopathy- none Head- atraumatic            Eyes- Gross vision intact, PERRLA, conjunctivae clear secretions            Ears- Hearing, canals-normal            Nose- , no-Septal dev, mucus, polyps, erosion, perforation             Throat- Mallampati IVI , mucosa clear , drainage- none, tonsils- atrophic, + hoarse Neck- flexible , trachea midline, no stridor , thyroid nl, carotid no bruit Chest - symmetrical excursion , unlabored           Heart/CV- RRR rapid , no murmur , no gallop  , no rub, nl s1 s2                           - JVD none , edema- none, stasis changes- none, varices- none           Lung-   Wheeze- none, cough+ slight dry, dullness-none, rub- none. Rales- none           Chest wall-  Abd-  Br/ Gen/ Rectal- Not done, not indicated Extrem- cyanosis- none, clubbing, none, atrophy- none, strength- nl,     negative Homans Neuro- grossly intact to observation

## 2018-07-20 ENCOUNTER — Telehealth: Payer: Self-pay | Admitting: Internal Medicine

## 2018-07-20 NOTE — Telephone Encounter (Signed)
Called and spoke with pt who stated she was getting her O2 supplies from [place] but states that her boyfriend bought her the machine.  I stated to pt that [employer] had called [place] and stated that she was going to need to contact the company where she purchased her equipment from. Pt expressed understanding. Nothing further needed.

## 2018-07-28 ENCOUNTER — Other Ambulatory Visit: Payer: Self-pay

## 2018-07-28 ENCOUNTER — Ambulatory Visit (HOSPITAL_COMMUNITY)
Admission: RE | Admit: 2018-07-28 | Discharge: 2018-07-28 | Disposition: A | Discharge status: Home / Self Care | Payer: Medicare Other | Source: Ambulatory Visit | Attending: Cardiology | Admitting: Cardiology

## 2018-07-28 ENCOUNTER — Encounter (HOSPITAL_COMMUNITY): Payer: Self-pay | Admitting: Cardiology

## 2018-07-28 VITALS — BP 95/72 | HR 86 | Wt 166.0 lb

## 2018-07-28 DIAGNOSIS — J449 Chronic obstructive pulmonary disease, unspecified: Secondary | ICD-10-CM | POA: Diagnosis not present

## 2018-07-28 DIAGNOSIS — Z801 Family history of malignant neoplasm of trachea, bronchus and lung: Secondary | ICD-10-CM | POA: Insufficient documentation

## 2018-07-28 DIAGNOSIS — Z955 Presence of coronary angioplasty implant and graft: Secondary | ICD-10-CM | POA: Insufficient documentation

## 2018-07-28 DIAGNOSIS — Z902 Acquired absence of lung [part of]: Secondary | ICD-10-CM | POA: Diagnosis not present

## 2018-07-28 DIAGNOSIS — E049 Nontoxic goiter, unspecified: Secondary | ICD-10-CM | POA: Diagnosis not present

## 2018-07-28 DIAGNOSIS — Z85118 Personal history of other malignant neoplasm of bronchus and lung: Secondary | ICD-10-CM | POA: Insufficient documentation

## 2018-07-28 DIAGNOSIS — Z85820 Personal history of malignant melanoma of skin: Secondary | ICD-10-CM | POA: Diagnosis not present

## 2018-07-28 DIAGNOSIS — Z79899 Other long term (current) drug therapy: Secondary | ICD-10-CM | POA: Diagnosis not present

## 2018-07-28 DIAGNOSIS — Z87891 Personal history of nicotine dependence: Secondary | ICD-10-CM | POA: Insufficient documentation

## 2018-07-28 DIAGNOSIS — Z9221 Personal history of antineoplastic chemotherapy: Secondary | ICD-10-CM | POA: Diagnosis not present

## 2018-07-28 DIAGNOSIS — Z7982 Long term (current) use of aspirin: Secondary | ICD-10-CM | POA: Insufficient documentation

## 2018-07-28 DIAGNOSIS — I5022 Chronic systolic (congestive) heart failure: Secondary | ICD-10-CM

## 2018-07-28 DIAGNOSIS — Z923 Personal history of irradiation: Secondary | ICD-10-CM | POA: Diagnosis not present

## 2018-07-28 DIAGNOSIS — I351 Nonrheumatic aortic (valve) insufficiency: Secondary | ICD-10-CM | POA: Insufficient documentation

## 2018-07-28 DIAGNOSIS — I2510 Atherosclerotic heart disease of native coronary artery without angina pectoris: Secondary | ICD-10-CM | POA: Diagnosis not present

## 2018-07-28 DIAGNOSIS — Z803 Family history of malignant neoplasm of breast: Secondary | ICD-10-CM | POA: Insufficient documentation

## 2018-07-28 DIAGNOSIS — Z7902 Long term (current) use of antithrombotics/antiplatelets: Secondary | ICD-10-CM | POA: Diagnosis not present

## 2018-07-28 DIAGNOSIS — I509 Heart failure, unspecified: Secondary | ICD-10-CM

## 2018-07-28 DIAGNOSIS — Z7951 Long term (current) use of inhaled steroids: Secondary | ICD-10-CM | POA: Diagnosis not present

## 2018-07-28 DIAGNOSIS — Z85038 Personal history of other malignant neoplasm of large intestine: Secondary | ICD-10-CM | POA: Diagnosis not present

## 2018-07-28 DIAGNOSIS — Z825 Family history of asthma and other chronic lower respiratory diseases: Secondary | ICD-10-CM | POA: Diagnosis not present

## 2018-07-28 DIAGNOSIS — I429 Cardiomyopathy, unspecified: Secondary | ICD-10-CM | POA: Diagnosis not present

## 2018-07-28 LAB — LIPID PANEL
CHOL/HDL RATIO: 3.5 ratio
Cholesterol: 134 mg/dL (ref 0–200)
HDL: 38 mg/dL — AB (ref >40)
LDL Cholesterol: 77 mg/dL (ref 0–99)
Triglycerides: 93 mg/dL (ref <150)
VLDL: 19 mg/dL (ref 0–40)

## 2018-07-28 LAB — BASIC METABOLIC PANEL
Anion gap: 6 (ref 5–15)
BUN: 8 mg/dL (ref 6–20)
CHLORIDE: 108 mmol/L (ref 98–111)
CO2: 25 mmol/L (ref 22–32)
Calcium: 9 mg/dL (ref 8.9–10.3)
Creatinine, Ser: 0.71 mg/dL (ref 0.44–1.00)
GFR calc Af Amer: >60 mL/min (ref >60)
GFR calc non Af Amer: >60 mL/min (ref >60)
GLUCOSE: 90 mg/dL (ref 70–99)
POTASSIUM: 3.6 mmol/L (ref 3.5–5.1)
Sodium: 139 mmol/L (ref 135–145)

## 2018-07-28 MED ORDER — FUROSEMIDE 20 MG PO TABS: 20.0000 mg | ORAL_TABLET | Freq: Every day | ORAL | 3 refills | Status: DC

## 2018-07-28 MED ORDER — CLOPIDOGREL BISULFATE 75 MG PO TABS: ORAL_TABLET | ORAL | 3 refills | Status: DC

## 2018-07-28 MED ORDER — CARVEDILOL 6.25 MG PO TABS: 6.2500 mg | ORAL_TABLET | Freq: Two times a day (BID) | ORAL | 3 refills | Status: DC

## 2018-07-28 MED ORDER — SPIRONOLACTONE 25 MG PO TABS: 25.0000 mg | ORAL_TABLET | Freq: Every day | ORAL | 3 refills | Status: DC

## 2018-07-28 MED ORDER — ATORVASTATIN CALCIUM 40 MG PO TABS: 40.0000 mg | ORAL_TABLET | Freq: Every day | ORAL | 3 refills | Status: DC

## 2018-07-28 MED ORDER — LOSARTAN POTASSIUM 25 MG PO TABS: 25.0000 mg | ORAL_TABLET | Freq: Every day | ORAL | 3 refills | Status: DC

## 2018-07-28 NOTE — Patient Instructions (Addendum)
Labs done today  Labs will need to be done in 10-14 days  START furosemide 20 mg (1 tab) daily  Your physician has requested that you have an echocardiogram. Echocardiography is a painless test that uses sound waves to create images of your heart. It provides your doctor with information about the size and shape of your heart and how well your heart's chambers and valves are working. This procedure takes approximately one hour. There are no restrictions for this procedure.  Follow up with the Advance Practice Provider in 4 weeks

## 2018-07-29 NOTE — Progress Notes (Signed)
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 female with history of CAD and cardiomyopathy probably out of proportion to CAD presents for followup of CHF.  Patient has a history of COPD, no longer smokes.   She was hospitalized in 9/18 with worsening dyspnea.  Echo showed EF 20-25%.  Cath showed 70% ostial left main stenosis.  PFTs were suggestive of severe COPD, she is using oxygen at night.  She was evaluated for CABG but this was considered too high risk.  Instead, she had PCI to ostial left main with DES.  She was also diuresed.    Patient returns for followup of CHF and CAD.  She is fairly stable symptomatically.  She can walk around her house without problems. She does have bendopnea.  She is short of breath walking longer distances or walking up stairs.  No chest pain.  No lightheadedness. Weight is up about 17 lbs since last appointment.   ECG (personally reviewed): NSR, QTc 481 msec   Labs (9/18): digoxin 1.1, K 4.4, creatinine 0.83 Labs (07/17/2017): K 3.5 Creatinine 0.79  Labs (4/19): K 4.5, creatinine 0.8  PMH: 1. COPD: Quit smoking 2003.  PFTs (9/18) with severe obstruction.  2. Chronic systolic CHF: ?Mixed cardiomyopathy (out of proportion to degree of CAD).  - Echo (8/18): EF 20-25%, inferior/inferoseptal akinesis, mildly dilated RV with moderately decreased systolic function, mild-moderate AI, mild MR.  -ECHO 08/2017: EF 50-55%. Grade II DD.Aortic valve: There was moderate regurgitation. - RHC (9/18): mean RA 5, PA 44/22, mean PCWP 7, CI 1.62 Fick/2.21 thermodilution.  3. CAD: LHC (9/18) with 70% ostial left main.  DES to ostial LM.  4. Non-small cell lung CA: 2003.  She had right upper lobectomy, chemotherapy (Iressa), and radiation.  5. H/o colon cancer: resection 2016.  6. H/o melanoma right leg, s/p resection.   Review of systems complete and found to be negative unless listed in HPI.    Social History   Socioeconomic  History  . Marital status: Widowed    Spouse name: Not on file  . Number of children: 1  . Years of education: 47  . Highest education level: Not on file  Occupational History  . Occupation: Disabled- Cytogeneticist: UNEMPLOYED  Social Needs  . Financial resource strain: Not on file  . Food insecurity:    Worry: Not on file    Inability: Not on file  . Transportation needs:    Medical: Not on file    Non-medical: Not on file  Tobacco Use  . Smoking status: Former Smoker    Packs/day: 2.00    Years: 23.00    Pack years: 46.00    Types: Cigarettes    Start date: 09/02/1978    Last attempt to quit: 10/26/2001    Years since quitting: 16.7  . Smokeless tobacco: Never Used  . Tobacco comment: Significant passive exposure from boy friend  Substance and Sexual Activity  . Alcohol use: No    Alcohol/week: 0.0 standard drinks    Comment: history of alcohol abuse 20-30  . Drug use: No    Comment: +MJ  . Sexual activity: Not on file  Lifestyle  . Physical activity:    Days per week: Not on file    Minutes per session: Not on file  . Stress: Not on file  Relationships  . Social connections:    Talks on phone: Not on file  Gets together: Not on file    Attends religious service: Not on file    Active member of club or organization: Not on file    Attends meetings of clubs or organizations: Not on file    Relationship status: Not on file  . Intimate partner violence:    Fear of current or ex partner: Not on file    Emotionally abused: Not on file    Physically abused: Not on file    Forced sexual activity: Not on file  Other Topics Concern  . Not on file  Social History Narrative   Health Care POA:    Emergency Contact:    End of Life Plan:    Who lives with you: self   Any pets: cat, Bart   Diet: Pt has a varied diet but reports not eating much because she is afraid of weight gain.   Exercise: Pt has no regular exercise routine.   Seatbelts: Pt reports wearing  seatbelt when in vehicles.    Hobbies: movies         Family History  Problem Relation Age of Onset  . Lung cancer Father        smoker and worked at a Academic librarian  . COPD Mother        not a smoker  . Other Mother        hx of hysterectomy for unspecified reason  . Breast cancer Mother   . Asthma Daughter   . Diabetes Maternal Grandfather   . Colon polyps Brother        approx 16 polyps on his first colonoscopy  . Other Maternal Aunt        non-cancerous growth in lungs; respiratory issues; smoker  . COPD Maternal Grandmother        d. 35  . Cancer Paternal Grandfather        oral/mouth cancer; chewed tobacco; d. older age  . Throat cancer Other        maternal great aunt (MGF's sister); not a smoker  . Cirrhosis Paternal Uncle        hx of alcohol abuse   . Colon cancer Neg Hx   . Rectal cancer Neg Hx   . Stomach cancer Neg Hx     Current Outpatient Medications  Medication Sig Dispense Refill  . Albuterol Sulfate (PROAIR RESPICLICK) 884 (90 Base) MCG/ACT AEPB Inhale 2 puffs into the lungs every 6 (six) hours as needed. 3 each 3  . ALPRAZolam (XANAX) 0.5 MG tablet Take 1 tablet (0.5 mg total) by mouth 2 (two) times daily as needed for up to 1 dose for anxiety. 40 tablet 0  . atorvastatin (LIPITOR) 40 MG tablet Take 1 tablet (40 mg total) by mouth daily at 6 PM. 90 tablet 3  . carvedilol (COREG) 6.25 MG tablet Take 1 tablet (6.25 mg total) by mouth 2 (two) times daily with a meal. 180 tablet 3  . clopidogrel (PLAVIX) 75 MG tablet TAKE 1 TABLET(75 MG) BY MOUTH DAILY 90 tablet 3  . fluticasone furoate-vilanterol (BREO ELLIPTA) 100-25 MCG/INH AEPB INHALE 1 PUFF BY MOUTH INTO THE LUNGS DAILY 3 each 3  . furosemide (LASIX) 20 MG tablet Take 1 tablet (20 mg total) by mouth daily. 90 tablet 3  . ipratropium-albuterol (DUONEB) 0.5-2.5 (3) MG/3ML SOLN USE 1 VIAL VIA NEBULIZER EVERY 6 HOURS AS NEEDED 75 mL 12  . losartan (COZAAR) 25 MG tablet Take 1 tablet (25 mg total) by mouth  daily. [place]  tablet 3  . nitroGLYCERIN (NITROSTAT) 0.4 MG SL tablet Place 1 tablet (0.4 mg total) under the tongue every 5 (five) minutes as needed for chest pain. 30 tablet 12  . sertraline (ZOLOFT) 100 MG tablet Take 50 mg by mouth daily.    Marland Kitchen spironolactone (ALDACTONE) 25 MG tablet Take 1 tablet (25 mg total) by mouth daily. 90 tablet 3  . zolpidem (AMBIEN) 5 MG tablet 1 at bedtime for sleep as needed 30 tablet 5   No current facility-administered medications for this encounter.    Vitals:   07/28/18 1400  BP: 95/72  Pulse: 86  SpO2: 91%  Weight: 75.3 kg (166 lb)    Wt Readings from Last 3 Encounters:  07/28/18 75.3 kg (166 lb)  07/17/18 74.6 kg (164 lb 6.4 oz)  06/23/18 74.4 kg (164 lb)   Physical Exam General: NAD Neck: JVP 8 cm, symmetrically enlarged thyroid.  Lungs: Clear to auscultation bilaterally with normal respiratory effort. CV: Nondisplaced PMI.  Heart regular S1/S2, no S3/S4, no murmur.  No peripheral edema.  No carotid bruit.  Normal pedal pulses.  Abdomen: Soft, nontender, no hepatosplenomegaly, no distention.  Skin: Intact without lesions or rashes.  Neurologic: Alert and oriented x 3.  Psych: Normal affect. Extremities: No clubbing or cyanosis.  HEENT: Normal.   Assessment/Plan: 1. Chronic systolic CHF: Suspect primarily ischemic cardiomyopathy.  Echo 9/18 with EF 20-25%.  Echo in 08/2017 with improved EF 50-55% Grade II DD. NYHA class II-III symptoms.  She also has severe COPD so this likely contributes to her dyspnea.  Her weight is up considerably. Possible mild volume overload but suspect weight gain is predominantly caloric. She rarely takes Lasix.  - Repeat echo to make sure EF remains improved.  - Continue coreg  6.25 mg BID - Continue spiro 25 mg daily.  - Continue losartan 25 mg daily.  - Start lasix 20 mg daily with BMET today and again in 10 days.    2. CAD: s/p DES to LM in 9/18.  No chest pain.  - Continue ASA 81, Plavix 75. Will likely continue  Plavix long-term.  - Continue atorvastatin 40 daily. Check lipids today.  3. Goiter: Per PCP.  4. COPD: Severe by 9/18 PFTs.  She no longer smokes.  Suspect this contributes to her dyspnea.  - Sees Dr. Annamaria Boots.   Followup in 6 wks with NP/PA.   Loralie Champagne, MD  07/29/2018

## 2018-08-07 ENCOUNTER — Ambulatory Visit (HOSPITAL_COMMUNITY)
Admission: RE | Admit: 2018-08-07 | Discharge: 2018-08-07 | Disposition: A | Discharge status: Home / Self Care | Payer: Medicare Other | Source: Ambulatory Visit | Attending: Cardiology | Admitting: Cardiology

## 2018-08-07 DIAGNOSIS — I509 Heart failure, unspecified: Secondary | ICD-10-CM

## 2018-08-07 LAB — BASIC METABOLIC PANEL
Anion gap: 13 (ref 5–15)
BUN: 22 mg/dL — ABNORMAL HIGH (ref 6–20)
CALCIUM: 9.4 mg/dL (ref 8.9–10.3)
CO2: 23 mmol/L (ref 22–32)
CREATININE: 1.12 mg/dL — AB (ref 0.44–1.00)
Chloride: 104 mmol/L (ref 98–111)
GFR calc non Af Amer: 56 mL/min — ABNORMAL LOW (ref >60)
Glucose, Bld: 90 mg/dL (ref 70–99)
Potassium: 4 mmol/L (ref 3.5–5.1)
Sodium: 140 mmol/L (ref 135–145)

## 2018-08-31 ENCOUNTER — Ambulatory Visit (HOSPITAL_COMMUNITY): Admission: RE | Admit: 2018-08-31 | Payer: Medicare Other | Source: Ambulatory Visit

## 2018-08-31 ENCOUNTER — Ambulatory Visit (HOSPITAL_COMMUNITY)
Admission: RE | Admit: 2018-08-31 | Discharge: 2018-08-31 | Disposition: A | Discharge status: Home / Self Care | Payer: Medicare Other | Source: Ambulatory Visit | Attending: Cardiology | Admitting: Cardiology

## 2018-08-31 DIAGNOSIS — I509 Heart failure, unspecified: Secondary | ICD-10-CM

## 2018-09-01 ENCOUNTER — Other Ambulatory Visit (HOSPITAL_COMMUNITY): Payer: Medicare Other

## 2018-09-16 ENCOUNTER — Other Ambulatory Visit: Payer: Self-pay

## 2018-09-16 ENCOUNTER — Encounter (HOSPITAL_COMMUNITY): Payer: Self-pay

## 2018-09-16 ENCOUNTER — Ambulatory Visit (HOSPITAL_COMMUNITY)
Admission: RE | Admit: 2018-09-16 | Discharge: 2018-09-16 | Disposition: A | Discharge status: Home / Self Care | Payer: Medicare Other | Source: Ambulatory Visit | Attending: Cardiology | Admitting: Cardiology

## 2018-09-16 ENCOUNTER — Ambulatory Visit (HOSPITAL_BASED_OUTPATIENT_CLINIC_OR_DEPARTMENT_OTHER)
Admission: RE | Admit: 2018-09-16 | Discharge: 2018-09-16 | Disposition: A | Discharge status: Home / Self Care | Payer: Medicare Other | Source: Ambulatory Visit | Attending: Cardiology | Admitting: Cardiology

## 2018-09-16 VITALS — BP 110/72 | HR 96 | Wt 166.4 lb

## 2018-09-16 DIAGNOSIS — Z79899 Other long term (current) drug therapy: Secondary | ICD-10-CM | POA: Diagnosis not present

## 2018-09-16 DIAGNOSIS — Z85038 Personal history of other malignant neoplasm of large intestine: Secondary | ICD-10-CM | POA: Insufficient documentation

## 2018-09-16 DIAGNOSIS — Z87891 Personal history of nicotine dependence: Secondary | ICD-10-CM | POA: Diagnosis not present

## 2018-09-16 DIAGNOSIS — I429 Cardiomyopathy, unspecified: Secondary | ICD-10-CM | POA: Insufficient documentation

## 2018-09-16 DIAGNOSIS — I2510 Atherosclerotic heart disease of native coronary artery without angina pectoris: Secondary | ICD-10-CM | POA: Insufficient documentation

## 2018-09-16 DIAGNOSIS — I509 Heart failure, unspecified: Secondary | ICD-10-CM

## 2018-09-16 DIAGNOSIS — Z801 Family history of malignant neoplasm of trachea, bronchus and lung: Secondary | ICD-10-CM | POA: Insufficient documentation

## 2018-09-16 DIAGNOSIS — E7800 Pure hypercholesterolemia, unspecified: Secondary | ICD-10-CM

## 2018-09-16 DIAGNOSIS — I351 Nonrheumatic aortic (valve) insufficiency: Secondary | ICD-10-CM | POA: Insufficient documentation

## 2018-09-16 DIAGNOSIS — Z803 Family history of malignant neoplasm of breast: Secondary | ICD-10-CM | POA: Diagnosis not present

## 2018-09-16 DIAGNOSIS — Z7902 Long term (current) use of antithrombotics/antiplatelets: Secondary | ICD-10-CM | POA: Insufficient documentation

## 2018-09-16 DIAGNOSIS — Z85820 Personal history of malignant melanoma of skin: Secondary | ICD-10-CM | POA: Diagnosis not present

## 2018-09-16 DIAGNOSIS — Z85118 Personal history of other malignant neoplasm of bronchus and lung: Secondary | ICD-10-CM | POA: Insufficient documentation

## 2018-09-16 DIAGNOSIS — J449 Chronic obstructive pulmonary disease, unspecified: Secondary | ICD-10-CM | POA: Diagnosis not present

## 2018-09-16 DIAGNOSIS — Z833 Family history of diabetes mellitus: Secondary | ICD-10-CM | POA: Diagnosis not present

## 2018-09-16 DIAGNOSIS — E049 Nontoxic goiter, unspecified: Secondary | ICD-10-CM | POA: Insufficient documentation

## 2018-09-16 DIAGNOSIS — Z800 Family history of malignant neoplasm of digestive organs: Secondary | ICD-10-CM | POA: Insufficient documentation

## 2018-09-16 DIAGNOSIS — Z955 Presence of coronary angioplasty implant and graft: Secondary | ICD-10-CM | POA: Diagnosis not present

## 2018-09-16 DIAGNOSIS — I5022 Chronic systolic (congestive) heart failure: Secondary | ICD-10-CM | POA: Diagnosis present

## 2018-09-16 NOTE — Progress Notes (Signed)
  Echocardiogram 2D Echocardiogram has been performed.  [name] 09/16/2018, 2:41 PM

## 2018-09-16 NOTE — Progress Notes (Signed)
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 female with history of CAD and cardiomyopathy probably out of proportion to CAD presents for followup of CHF.  Patient has a history of COPD, no longer smokes.   She was hospitalized in 9/18 with worsening dyspnea.  Echo showed EF 20-25%.  Cath showed 70% ostial left main stenosis.  PFTs were suggestive of severe COPD, she is using oxygen at night.  She was evaluated for CABG but this was considered too high risk.  Instead, she had PCI to ostial left main with DES.  She was also diuresed.    Today she returns for HF follow up. Overall feeling fine. Recently treated for pneumonia and COPD flare. Denies SOB/PND/Orthopnea. No chest pain.  Appetite ok. No fever or chills. Weight at home has been stable. Taking all medications.  Labs (9/18): digoxin 1.1, K 4.4, creatinine 0.83 Labs (07/17/2017): K 3.5 Creatinine 0.79  Labs (4/19): K 4.5, creatinine 0.8 Labs (08/07/2018): K 4 Creatinine 1.12   PMH: 1. COPD: Quit smoking 2003.  PFTs (9/18) with severe obstruction.  2. Chronic systolic CHF: ?Mixed cardiomyopathy (out of proportion to degree of CAD).  - Echo (8/18): EF 20-25%, inferior/inferoseptal akinesis, mildly dilated RV with moderately decreased systolic function, mild-moderate AI, mild MR.  -ECHO 08/2017: EF 50-55%. Grade II DD.Aortic valve: There was moderate regurgitation. - RHC (9/18): mean RA 5, PA 44/22, mean PCWP 7, CI 1.62 Fick/2.21 thermodilution.  3. CAD: LHC (9/18) with 70% ostial left main.  DES to ostial LM.  4. Non-small cell lung CA: 2003.  She had right upper lobectomy, chemotherapy (Iressa), and radiation.  5. H/o colon cancer: resection 2016.  6. H/o melanoma right leg, s/p resection.   Review of systems complete and found to be negative unless listed in HPI.    Social History   Socioeconomic History  . Marital status: Widowed    Spouse name: Not on file  . Number of children: 1    . Years of education: 66  . Highest education level: Not on file  Occupational History  . Occupation: Disabled- Cytogeneticist: UNEMPLOYED  Social Needs  . Financial resource strain: Not on file  . Food insecurity:    Worry: Not on file    Inability: Not on file  . Transportation needs:    Medical: Not on file    Non-medical: Not on file  Tobacco Use  . Smoking status: Former Smoker    Packs/day: 2.00    Years: 23.00    Pack years: 46.00    Types: Cigarettes    Start date: 09/02/1978    Last attempt to quit: 10/26/2001    Years since quitting: 16.9  . Smokeless tobacco: Never Used  . Tobacco comment: Significant passive exposure from boy friend  Substance and Sexual Activity  . Alcohol use: No    Alcohol/week: 0.0 standard drinks    Comment: history of alcohol abuse 20-30  . Drug use: No    Comment: +MJ  . Sexual activity: Not on file  Lifestyle  . Physical activity:    Days per week: Not on file    Minutes per session: Not on file  . Stress: Not on file  Relationships  . Social connections:    Talks on phone: Not on file    Gets together: Not on file    Attends religious service: Not on file    Active member of  club or organization: Not on file    Attends meetings of clubs or organizations: Not on file    Relationship status: Not on file  . Intimate partner violence:    Fear of current or ex partner: Not on file    Emotionally abused: Not on file    Physically abused: Not on file    Forced sexual activity: Not on file  Other Topics Concern  . Not on file  Social History Narrative   Health Care POA:    Emergency Contact:    End of Life Plan:    Who lives with you: self   Any pets: cat, Bart   Diet: Pt has a varied diet but reports not eating much because she is afraid of weight gain.   Exercise: Pt has no regular exercise routine.   Seatbelts: Pt reports wearing seatbelt when in vehicles.    Hobbies: movies         Family History  Problem Relation  Age of Onset  . Lung cancer Father        smoker and worked at a Academic librarian  . COPD Mother        not a smoker  . Other Mother        hx of hysterectomy for unspecified reason  . Breast cancer Mother   . Asthma Daughter   . Diabetes Maternal Grandfather   . Colon polyps Brother        approx 16 polyps on his first colonoscopy  . Other Maternal Aunt        non-cancerous growth in lungs; respiratory issues; smoker  . COPD Maternal Grandmother        d. 105  . Cancer Paternal Grandfather        oral/mouth cancer; chewed tobacco; d. older age  . Throat cancer Other        maternal great aunt (MGF's sister); not a smoker  . Cirrhosis Paternal Uncle        hx of alcohol abuse   . Colon cancer Neg Hx   . Rectal cancer Neg Hx   . Stomach cancer Neg Hx     Current Outpatient Medications  Medication Sig Dispense Refill  . Albuterol Sulfate (PROAIR RESPICLICK) 096 (90 Base) MCG/ACT AEPB Inhale 2 puffs into the lungs every 6 (six) hours as needed. 3 each 3  . ALPRAZolam (XANAX) 0.5 MG tablet Take 1 tablet (0.5 mg total) by mouth 2 (two) times daily as needed for up to 1 dose for anxiety. 40 tablet 0  . atorvastatin (LIPITOR) 40 MG tablet Take 1 tablet (40 mg total) by mouth daily at 6 PM. 90 tablet 3  . carvedilol (COREG) 6.25 MG tablet Take 1 tablet (6.25 mg total) by mouth 2 (two) times daily with a meal. 180 tablet 3  . clopidogrel (PLAVIX) 75 MG tablet TAKE 1 TABLET(75 MG) BY MOUTH DAILY 90 tablet 3  . fluticasone furoate-vilanterol (BREO ELLIPTA) 100-25 MCG/INH AEPB INHALE 1 PUFF BY MOUTH INTO THE LUNGS DAILY 3 each 3  . furosemide (LASIX) 20 MG tablet Take 1 tablet (20 mg total) by mouth daily. 90 tablet 3  . ipratropium-albuterol (DUONEB) 0.5-2.5 (3) MG/3ML SOLN USE 1 VIAL VIA NEBULIZER EVERY 6 HOURS AS NEEDED 75 mL 12  . losartan (COZAAR) 25 MG tablet Take 1 tablet (25 mg total) by mouth daily. 90 tablet 3  . pantoprazole (PROTONIX) 40 MG tablet     . predniSONE (DELTASONE) 10  MG tablet     .  spironolactone (ALDACTONE) 25 MG tablet Take 1 tablet (25 mg total) by mouth daily. 90 tablet 3  . zolpidem (AMBIEN) 5 MG tablet 1 at bedtime for sleep as needed 30 tablet 5  . nitroGLYCERIN (NITROSTAT) 0.4 MG SL tablet Place 1 tablet (0.4 mg total) under the tongue every 5 (five) minutes as needed for chest pain. (Patient not taking: Reported on 09/16/2018) 30 tablet 12   No current facility-administered medications for this encounter.    Vitals:   09/16/18 1504  BP: 110/72  Pulse: 96  SpO2: 94%  Weight: 75.5 kg (166 lb 6.4 oz)    Wt Readings from Last 3 Encounters:  09/16/18 75.5 kg (166 lb 6.4 oz)  07/28/18 75.3 kg (166 lb)  07/17/18 74.6 kg (164 lb 6.4 oz)   Physical Exam General:  Well appearing. No resp difficulty HEENT: normal Neck: supple. no JVD. Carotids 2+ bilat; no bruits. No lymphadenopathy or thryomegaly appreciated. Cor: PMI nondisplaced. Regular rate & rhythm. No rubs, gallops or murmurs. Lungs: clear Abdomen: soft, nontender, nondistended. No hepatosplenomegaly. No bruits or masses. Good bowel sounds. Extremities: no cyanosis, clubbing, rash, edema Neuro: alert & orientedx3, cranial nerves grossly intact. moves all 4 extremities w/o difficulty. Affect pleasant  Assessment/Plan: 1. Chronic systolic CHF: Suspect primarily ischemic cardiomyopathy.  Echo 9/18 with EF 20-25%.  Echo in 08/2017 with improved EF 50-55% Grade II DD. ECHO today briefly reviewed by  Dr Haroldine Laws 55-60%  Volume status stable. Continue lasix as the current dose.  - Continue coreg  6.25 mg BID - Continue spiro 25 mg daily.  - Continue losartan 25 mg daily.  2. CAD: s/p DES to LM in 9/18.  No chest pain.  - Continue ASA 81, Plavix 75. Will likely continue Plavix long-term.  - Continue atorvastatin 40 daily.  -No s/s ischemia 3. Goiter: Per PCP.  4. COPD: Severe by 9/18 PFTs.  She no longer smokes.  Suspect this contributes to her dyspnea.  - Sees Dr. Annamaria Boots.   EF has  recovered. I discussed ECHO results. I will refer to Dr Oval Linsey for general cardiology care. Follow up in the HF clinic as needed.  Darrick Grinder, NP  09/16/2018

## 2018-09-16 NOTE — Patient Instructions (Addendum)
It was great to see you today! No medication changes are needed at this time.   Your physician recommends that you schedule a follow-up appointment in: 4 months with Skeet Latch, MD  [facility] [address] [place], [place] [address] Phone: [phone]  They will be in contact with you to arrange a follow up.   Please follow up with the [facility] only as needed.

## 2018-09-29 NOTE — Assessment & Plan Note (Signed)
We will give her the requested prescription for replacement supplies for her POC.  She continues to need oxygen during sleep and with exertion.

## 2018-09-29 NOTE — Assessment & Plan Note (Signed)
She continues long-term surveillance with no recurrence.

## 2018-09-29 NOTE — Assessment & Plan Note (Signed)
Acute event today was apparently cough triggered by LPR while eating grits and now resolved.  She denies ongoing problems with swallowing or with reflux. Plan-cautioned to be deliberate and careful while swallowing

## 2018-10-05 ENCOUNTER — Other Ambulatory Visit: Payer: Self-pay | Admitting: Internal Medicine

## 2018-11-25 ENCOUNTER — Other Ambulatory Visit (HOSPITAL_COMMUNITY): Payer: Self-pay | Admitting: Cardiology

## 2018-11-30 MED ORDER — ALBUTEROL SULFATE HFA 108 (90 BASE) MCG/ACT IN AERS: 2.0000 | INHALATION_SPRAY | Freq: Four times a day (QID) | RESPIRATORY_TRACT | 3 refills | Status: DC | PRN

## 2018-11-30 NOTE — Telephone Encounter (Signed)
Ok to change her Proair to [employer] HFA, # 3,   Inhale 2 puffs every 6 hours for breathing, refill x 3

## 2018-11-30 NOTE — Telephone Encounter (Signed)
Dr Annamaria Boots,  This message was received this morning for you.  I am having trouble getting my pro air powder form as they have none was told they have lots of pro air aerosol I need u to send in me a new prescription to [facility] on [address] for aerosol form please 3 at a time with refills please thank you and I hope you are doing and feeling well  Message routed to Dr Annamaria Boots, to advise on change and refills

## 2018-11-30 NOTE — Addendum Note (Signed)
Addended by: Elton Sin on: 11/30/2018 11:17 AM   Modules accepted: Orders

## 2018-12-03 ENCOUNTER — Other Ambulatory Visit: Payer: Self-pay | Admitting: Family Medicine

## 2018-12-10 ENCOUNTER — Telehealth: Payer: Self-pay | Admitting: Internal Medicine

## 2018-12-10 MED ORDER — ZOLPIDEM TARTRATE 5 MG PO TABS: ORAL_TABLET | ORAL | 5 refills | Status: DC

## 2018-12-10 NOTE — Telephone Encounter (Signed)
Called and spoke with patient regarding Ambien rx has been sent by CY Pt verbalized and expressed understanding Nothing further is needed at this time.

## 2018-12-10 NOTE — Telephone Encounter (Signed)
Ambien refill e-sent. If it does turn out that she needs refills of other meds before next visit, then let us know.

## 2018-12-10 NOTE — Telephone Encounter (Signed)
Looks like the Ambien 5mg  is the only medication that she would need a refill on at this time or before her 04/26/2019 appt with Dr. Annamaria Boots.   Dr. Annamaria Boots please advise on medication refill No Known Allergies  Current Outpatient Medications on File Prior to Visit  Medication Sig Dispense Refill  . albuterol (PROAIR HFA) 108 (90 Base) MCG/ACT inhaler Inhale 2 puffs into the lungs every 6 (six) hours as needed for wheezing or shortness of breath. 3 Inhaler 3  . Albuterol Sulfate (PROAIR RESPICLICK) 174 (90 Base) MCG/ACT AEPB Inhale 2 puffs into the lungs every 6 (six) hours as needed. 3 each 3  . ALPRAZolam (XANAX) 0.5 MG tablet Take 1 tablet (0.5 mg total) by mouth 2 (two) times daily as needed for up to 1 dose for anxiety. 40 tablet 0  . atorvastatin (LIPITOR) 40 MG tablet Take 1 tablet (40 mg total) by mouth daily at 6 PM. 90 tablet 3  . BREO ELLIPTA 100-25 MCG/INH AEPB INHALE 1 PUFF BY MOUTH EVERY DAY 180 each 1  . carvedilol (COREG) 6.25 MG tablet Take 1 tablet (6.25 mg total) by mouth 2 (two) times daily with a meal. 180 tablet 3  . clopidogrel (PLAVIX) 75 MG tablet TAKE 1 TABLET(75 MG) BY MOUTH DAILY 90 tablet 3  . furosemide (LASIX) 20 MG tablet TAKE 1 TABLET BY MOUTH EVERY DAY 90 tablet 0  . ipratropium-albuterol (DUONEB) 0.5-2.5 (3) MG/3ML SOLN USE 1 VIAL VIA NEBULIZER EVERY 6 HOURS AS NEEDED 75 mL 12  . losartan (COZAAR) 25 MG tablet Take 1 tablet (25 mg total) by mouth daily. 90 tablet 3  . nitroGLYCERIN (NITROSTAT) 0.4 MG SL tablet Place 1 tablet (0.4 mg total) under the tongue every 5 (five) minutes as needed for chest pain. (Patient not taking: Reported on 09/16/2018) 30 tablet 12  . pantoprazole (PROTONIX) 40 MG tablet     . predniSONE (DELTASONE) 10 MG tablet     . spironolactone (ALDACTONE) 25 MG tablet Take 1 tablet (25 mg total) by mouth daily. 90 tablet 3  . zolpidem (AMBIEN) 5 MG tablet 1 at bedtime for sleep as needed 30 tablet 5   No current facility-administered medications  on file prior to visit.

## 2019-01-11 ENCOUNTER — Other Ambulatory Visit (HOSPITAL_COMMUNITY): Payer: Self-pay

## 2019-01-11 ENCOUNTER — Ambulatory Visit: Payer: Medicare Other | Admitting: Cardiovascular Disease

## 2019-01-11 MED ORDER — LOSARTAN POTASSIUM 25 MG PO TABS: 25.0000 mg | ORAL_TABLET | Freq: Every day | ORAL | 1 refills | Status: DC

## 2019-01-15 ENCOUNTER — Ambulatory Visit: Payer: Medicare Other | Admitting: Internal Medicine

## 2019-01-18 ENCOUNTER — Ambulatory Visit: Payer: Medicare Other | Admitting: Internal Medicine

## 2019-01-18 ENCOUNTER — Telehealth: Payer: Self-pay | Admitting: Internal Medicine

## 2019-01-18 NOTE — Telephone Encounter (Signed)
Returned call to patient.  Patient cancelled appointment today with Dr. Annamaria Boots due to fever.  She says she 'feels fine and thinks it allergies' but she did not want to come to the office due to low grade temp.  She has rescheduled for 02/10/19 with Dr. Annamaria Boots and will call us if further concerns.  Patient denies feeling SOB and she will seek medical attention if symptoms worsen. Nothing further needed.

## 2019-01-18 NOTE — Telephone Encounter (Signed)
Pt is calling back to cancel apt. Pt aware that a nurse will be calling in regards to previous msg and to determine when a resched apt would be appropriate. Cb is [phone]

## 2019-02-10 ENCOUNTER — Telehealth: Payer: Self-pay | Admitting: Internal Medicine

## 2019-02-10 ENCOUNTER — Encounter: Payer: Self-pay | Admitting: Internal Medicine

## 2019-02-10 ENCOUNTER — Ambulatory Visit (INDEPENDENT_AMBULATORY_CARE_PROVIDER_SITE_OTHER): Payer: Medicare Other

## 2019-02-10 ENCOUNTER — Ambulatory Visit (INDEPENDENT_AMBULATORY_CARE_PROVIDER_SITE_OTHER): Payer: Medicare Other | Admitting: Internal Medicine

## 2019-02-10 ENCOUNTER — Other Ambulatory Visit: Payer: Self-pay

## 2019-02-10 VITALS — BP 100/62 | HR 85 | Temp 98.1°F | Ht 65.0 in | Wt 173.4 lb

## 2019-02-10 DIAGNOSIS — G4734 Idiopathic sleep related nonobstructive alveolar hypoventilation: Secondary | ICD-10-CM

## 2019-02-10 DIAGNOSIS — J449 Chronic obstructive pulmonary disease, unspecified: Secondary | ICD-10-CM | POA: Diagnosis not present

## 2019-02-10 DIAGNOSIS — J9611 Chronic respiratory failure with hypoxia: Secondary | ICD-10-CM | POA: Diagnosis not present

## 2019-02-10 DIAGNOSIS — R0609 Other forms of dyspnea: Secondary | ICD-10-CM | POA: Diagnosis not present

## 2019-02-10 LAB — CBC WITH DIFFERENTIAL/PLATELET
Basophils Absolute: 0.1 10*3/uL (ref 0.0–0.1)
Basophils Relative: 0.6 % (ref 0.0–3.0)
Eosinophils Absolute: 0.1 10*3/uL (ref 0.0–0.7)
Eosinophils Relative: 1.6 % (ref 0.0–5.0)
HCT: 38.7 % (ref 36.0–46.0)
Hemoglobin: 13.1 g/dL (ref 12.0–15.0)
Lymphocytes Relative: 16.6 % (ref 12.0–46.0)
Lymphs Abs: 1.4 10*3/uL (ref 0.7–4.0)
MCHC: 33.9 g/dL (ref 30.0–36.0)
MCV: 84.9 fl (ref 78.0–100.0)
Monocytes Absolute: 0.7 10*3/uL (ref 0.1–1.0)
Monocytes Relative: 8.4 % (ref 3.0–12.0)
Neutro Abs: 5.9 10*3/uL (ref 1.4–7.7)
Neutrophils Relative %: 72.8 % (ref 43.0–77.0)
Platelets: 240 10*3/uL (ref 150.0–400.0)
RBC: 4.56 Mil/uL (ref 3.87–5.11)
RDW: 13.4 % (ref 11.5–15.5)
WBC: 8.2 10*3/uL (ref 4.0–10.5)

## 2019-02-10 NOTE — Assessment & Plan Note (Signed)
She needs home O2 especially for sleep and exertion Plan- replace her worn out privately owned concentrator to O2 2l, recheck [place] on room air

## 2019-02-10 NOTE — Assessment & Plan Note (Signed)
Increased dyspnea may reflect weight gain. Note lack of cough or wheeze. Cardiac status better as of last Echo. Plan CXR

## 2019-02-10 NOTE — Patient Instructions (Addendum)
Order- CXR  Dx Dyspnea on exertion, right upper lobecctomy  Order- schedule overnight oximetry    Dx Nocturnal hypoxemia  Order- walk test on room air, O2 qualifying  Order- lab cBC w diff  Dx dyspnea on exertion

## 2019-02-10 NOTE — Telephone Encounter (Signed)
[facility] may not provide Portable Oxygen Carriers (POCs),. Many DMEs don't. I think maybe [facility] and [facility] do, but not sure.

## 2019-02-10 NOTE — Progress Notes (Signed)
Patient ID: [name], female    DOB: 04-28-1966, 53 y.o.   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 Walk test on room air 06/28/2016-95%, 95%, 95%, 93%, peak heart rate 120/minute. No desaturation after 3185 feet. Office Spirometry 06/28/2016-limited validity due to cough. Restriction of exhaled volume. FVC 1.79/50%, FEV1 1.64/57%, ratio 0.80. FENO- 5 Walk Test on room Air- Qualified for home O2 04/10/17 PFT 06/17/17- severe obstruction, mild restriction, diffusion severely reduced. Insignificant response to bronchodilator. FVC 1.73/47%, FEV1 1.10/38%, ratio 0.64, TLC 77%, DLCO 32% CHF- acute hosp 05/2017- CHF, CAD, EF 20-25%. Stent. 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 Neb DuoNeb, Breo 101, ProAir Respiclick, Here with boyfriend [name] She reports getting choked today on  grits-hard coughing but okay now. Needs replacement for replacement of column pair and her Inogen G3 POC.  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 5x qd. Would like prescription for O2 2L at night to be sent to [facility]. Current self-owned machine is acting up. C/O 40lb weight gain. Body weight today 173 lbs( weighed 148 lbs 02/12/18, up to 164 by 06/23/18.) More DOE w ADLS but little cough or wheeze. Mild fluid retention but little cough or wheeze, no palpitation or chest pain.  Arrival sat today 91% room air.  Walk test 02/10/2019- desat to 88%, on 2L o2 was 94% Echocardiogram 09/16/18- DD, EF 65-70%  Review of Systems-+ = positive Constitutional:   No-   weight loss, night sweats, fevers, chills, fatigue, lassitude. HEENT:   No-  headaches, difficulty swallowing, tooth/dental problems, sore throat,      mild sneezing,no- itching, ear ache, nasal congestion, post nasal drip,  CV:  No-   chest pain, orthopnea, PND, swelling in lower extremities, anasarca, dizziness, palpitations Resp: + shortness of breath with exertion or at rest.              productive cough, + non-productive cough,  No- coughing up of blood.               change in color of mucus.  wheezing.   Skin: No-   rash or lesions. GI:  No-   heartburn, indigestion, abdominal pain, nausea, vomiting,  GU:   MS:  No-   joint pain or swelling.   Neuro-     nothing unusual Psych:  No- change in mood or affect. No depression or anxiety.  No memory  loss.   Objective:   Physical Exam General- Alert, Oriented, Affect-appropriate, Distress - NAD, + overweight    Skin- rash-none, lesions- none, excoriation- none.  Lymphadenopathy- none Head- atraumatic            Eyes- Gross vision intact, PERRLA, conjunctivae clear secretions            Ears- Hearing, canals-normal            Nose- , no-Septal dev, mucus, polyps, erosion, perforation             Throat- Mallampati IVI , mucosa clear , drainage- none, tonsils- atrophic, + hoarse Neck- flexible , trachea midline, no stridor , thyroid nl, carotid no bruit Chest - symmetrical excursion , unlabored           Heart/CV- RRR rapid , no murmur , no gallop  , no rub, nl s1 s2                           - JVD none , edema+1, stasis changes- none, varices- none           Lung-   Clear, Wheeze- none, cough-none, dullness-none, rub- none. Rales- none           Chest wall-  Abd-  Br/ Gen/ Rectal- Not done, not indicated Extrem- cyanosis- none, clubbing, none, atrophy- none, strength- nl,     negative Homans Neuro- grossly intact to observation

## 2019-02-10 NOTE — Telephone Encounter (Signed)
Called & spoke w/ pt. Pt states she needs a home unit & POC O2 concentrator. Pt seen in-office today 02/10/2019 w/ CY. We performed an O2 qualifying walk in which she qualified for. CY verbally ordered to have [facility] provide her w/ O2 POC and continuous unit. Order was placed 02/10/2019 at 11:33 AM.   Pt states she received a call from [facility], who informed her according to order received from [facility], she would need refillable O2 tanks, which she states she has never had before. Pt states she needs a home unit and a POC. I let pt know we would take care of this. I have resent the order with the correct information per pt and CY. Routing to CY as an [place]. Nothing further needed at this time.

## 2019-02-11 NOTE — Telephone Encounter (Addendum)
Noted, thank you.  As of yesterday 02/10/2019, new order placed has been sent to [facility] by [facility] to see if we can get this pt a Portable Oxygen Carrier (POC) as requested. Nothing further needed at this time.

## 2019-02-15 ENCOUNTER — Telehealth: Payer: Self-pay | Admitting: Internal Medicine

## 2019-02-15 DIAGNOSIS — J9611 Chronic respiratory failure with hypoxia: Secondary | ICD-10-CM

## 2019-02-15 NOTE — Telephone Encounter (Signed)
Called and spoke with pt. Pt stated she had an ONO performed 6/11 and then returned machine to DME 6/12.  Pt stated she called DME to see if they could tell her the results of it and they told her that they did fax the results to our office for CY to be able to review. Pt also stated she is waiting to get home O2 and POC and was told by DME once the results of ONO have been stated to her, that was when DME said they could be able to get all taken care of in regards to O2.  Dr. Annamaria Boots, please advise on this for pt. Thanks!

## 2019-02-16 NOTE — Telephone Encounter (Signed)
[facility] pt, line went to voicemail. LMTCB.  [Note: I spoke w/ CY about the previous phone notes. Pt is insistent on getting a Portable Oxygen Concentrator (POC) in addition to home O2. We discovered that [facility] will not provide pt with this, therefore, DME company was switched to [facility], who will supply pt with POC.   If pt still wants POC, CY authorizes order to write as follows:  DME- [facility] - home O2 continuous and portable 2L Dx Chronic Respiratory failure with hypoxia.]  Will pend these orders and keep encounter open until pt calls back or is contacted with these measures.

## 2019-02-16 NOTE — Telephone Encounter (Signed)
Pt returning call and can be reached @ [phone].[name]

## 2019-02-16 NOTE — Telephone Encounter (Signed)
Raquel Sarna, please advise if results on pt's ONO was received from DME for CY to look at. Thank you!

## 2019-02-16 NOTE — Telephone Encounter (Signed)
Called and spoke with pt in regards to the information stated by both CY and Janeth Rase. Pt stated that she is fine switching to [facility] as long as they will be able to eventually provide her a POC and O2 for home. Order was placed from the pended status. Nothing further needed.

## 2019-02-16 NOTE — Telephone Encounter (Signed)
Overnight oximetry showed sustained room air saturation at 88% or less. Together with her walk test at last visit, she qualifies for home O2.  Please order DME [facility]- home O2 continuous and portable 2L     Dx chronic respiratory failure with hypoxia.

## 2019-03-29 ENCOUNTER — Other Ambulatory Visit: Payer: Self-pay | Admitting: Internal Medicine

## 2019-04-26 ENCOUNTER — Ambulatory Visit: Payer: Medicare Other | Admitting: Internal Medicine

## 2019-05-03 ENCOUNTER — Ambulatory Visit: Payer: Medicare Other | Admitting: Cardiovascular Disease

## 2019-05-13 ENCOUNTER — Encounter: Payer: Self-pay | Admitting: Cardiovascular Disease

## 2019-05-13 ENCOUNTER — Ambulatory Visit (INDEPENDENT_AMBULATORY_CARE_PROVIDER_SITE_OTHER): Payer: Medicare Other | Admitting: Cardiovascular Disease

## 2019-05-13 ENCOUNTER — Other Ambulatory Visit: Payer: Self-pay

## 2019-05-13 VITALS — BP 102/60 | HR 83 | Temp 98.4°F | Ht 65.0 in | Wt 169.2 lb

## 2019-05-13 DIAGNOSIS — I2510 Atherosclerotic heart disease of native coronary artery without angina pectoris: Secondary | ICD-10-CM

## 2019-05-13 DIAGNOSIS — Z955 Presence of coronary angioplasty implant and graft: Secondary | ICD-10-CM

## 2019-05-13 DIAGNOSIS — J449 Chronic obstructive pulmonary disease, unspecified: Secondary | ICD-10-CM | POA: Diagnosis not present

## 2019-05-13 DIAGNOSIS — I5032 Chronic diastolic (congestive) heart failure: Secondary | ICD-10-CM

## 2019-05-13 MED ORDER — FUROSEMIDE 20 MG PO TABS: 20.0000 mg | ORAL_TABLET | Freq: Every day | ORAL | 3 refills | Status: DC

## 2019-05-13 MED ORDER — CLOPIDOGREL BISULFATE 75 MG PO TABS: ORAL_TABLET | ORAL | 3 refills | Status: DC

## 2019-05-13 MED ORDER — CARVEDILOL 6.25 MG PO TABS: 6.2500 mg | ORAL_TABLET | Freq: Two times a day (BID) | ORAL | 3 refills | Status: DC

## 2019-05-13 MED ORDER — SPIRONOLACTONE 25 MG PO TABS: 25.0000 mg | ORAL_TABLET | Freq: Every day | ORAL | 3 refills | Status: DC

## 2019-05-13 MED ORDER — LOSARTAN POTASSIUM 25 MG PO TABS: 25.0000 mg | ORAL_TABLET | Freq: Every day | ORAL | 3 refills | Status: DC

## 2019-05-13 MED ORDER — ATORVASTATIN CALCIUM 40 MG PO TABS: 40.0000 mg | ORAL_TABLET | Freq: Every day | ORAL | 3 refills | Status: DC

## 2019-05-13 NOTE — Progress Notes (Signed)
Cardiology Office Note   Date:  05/13/2019   ID:  [name], DOB 1966/07/26, MRN [id number]  PCP:  Leanna Battles, MD  Cardiologist:   Skeet Latch, MD   No chief complaint on file.    History of Present Illness: [name] is a 53 y.o. female with chronic systolic and diastolic heart failure (LVEF improved from 20 to 25% to 50%), CAD status post PCI, severe COPD, lung cancer in remission (lobectomy, XRT, and chemo), colon cancer status post resection, and melanoma here to establish care.  She was previously a patient of Dr. Marigene Ehlers.  She presented 04/2017 with acute systolic heart failure (LVEF was 20 to 25% with inferior/inferoseptal akinesis.  Her RV was mildly dilated and had decreased systolic function.  Left heart cath revealed 70% ostial left main disease.  She had a drug-eluting stent placed in the ostial LAD left main.  Subsequent echo 08/2017 revealed an improvement in her function to 50 to 55%.    She had moderate aortic regurgitation at that time.  She last followed up in the heart failure clinic 09/2018 and was doing well.  [name] reports chronic shortness of breath that has been ongoing since her lung cancer.  She thinks it is been a little bit worse lately.  She tries not to use her oxygen during the day.  She does use it regularly at night.  She notes that she snores loudly and had a sleep study in the past that showed oxygen desaturations.  She has no chest pain.  She has a mild swelling in her right ankle that is chronic.  She has no orthopnea or PND.   Past Medical History:  Diagnosis Date  . Acute bilateral low back pain without sciatica   . Acute congestive heart failure ([place]) 05/02/2017   EF 20-25% on ECHO  . Acute respiratory failure with hypoxia ([place]) 05/01/2017  . 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 Allergic rhinitis   . Allergy   . Anxiety disorder   . Aortic valve regurgitation 05/15/2017  . Asthma   . Chronic bronchitis   . Colon  cancer ([place])    adenocarcinoma in a polyps 11-24-2014  . COPD (chronic obstructive pulmonary disease) ([place])   . Cough   . Dizziness and giddiness   . Dyspepsia   . Dyspnea 04/30/2017  . Emphysema of lung ([place])   . External hemorrhoids   . EXTERNAL HEMORRHOIDS 09/27/2009  . Female stress incontinence   . Genetic testing 08/18/2016   Negative for known pathogenic mutations within any of 25 genes on a Custom Panel through [employer].  One variant of uncertain significance (VUS) called "c.7187C>G (p.Thr2396Ser)" was found in one copy of the ATM gene.  This Custom Cancer Panel offered by [employer] includes sequencing and/or duplication/deletion testing of the following 25 genes: APC, ATM, AXIN2, BAP1, BMPR1A, BRCA1, BRCA2, CDH1, CDK4, CDKN2A, CHEK2, EPCAM, MITF, MLH1, MSH2, MSH6, MUTYH, PMS2, POLD1, POLE, PTEN, SCG5/GREM1, SMAD4, STK11, and TP53. Date of report is July 23, 2016.  MSH2 Exons 1-7 Inversion Analysis was also negative through [employer].  Date of report is July 23, 2016.     Marland Kitchen GERD (gastroesophageal reflux disease)    occ depending on diet   . Goiter   . History of lung cancer   . Hypercholesteremia    denies-last check normal labs  . Hypokalemia   . 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 Insomnia   . Lung cancer (  HCC)   . Migraine, unspecified, without mention of intractable migraine without mention of status migrainosus   . Panic attacks   . Seizures ([place])    very long ago like 15 years ago or so , questionable etilogy  . Skin cancer (melanoma) ([place])    right calf  . Snoring 12/11/2010   Longstanding hx of snore. NPSG was normal years ago.    Marland Kitchen Swelling of right lower extremity 12/01/2014    Past Surgical History:  Procedure Laterality Date  . ABDOMINAL HYSTERECTOMY  2001   no BSO  . biopsy of right neck mass     negative-thyroid biopsy   . BREAST EXCISIONAL BIOPSY Right 1987   no visible scar  . BRONCHOSCOPY  01/31/2005    nonspecific inflammation  . BRONCHOSCOPY  08/24/2010   nonspecific inflammation  . COLONOSCOPY    . CORONARY STENT INTERVENTION N/A 05/12/2017   Procedure: CORONARY STENT INTERVENTION;  Surgeon: Charolette Forward, MD;  Location: [place] [facility];  Service: Cardiovascular;  Laterality: N/A;  . LUNG REMOVAL, PARTIAL     for lung cancer--followed by Dr. Arlyce Dice and Dr. Annamaria Boots  . MELANOMA EXCISION WITH SENTINEL LYMPH NODE BIOPSY Right 11/29/2014   Procedure: RIGHT INGUINAL SENTINEL LYMPH NODE BIOPSY;  Surgeon: Georganna Skeans, MD;  Location: [place];  Service: General;  Laterality: Right;  . melanoma removal     right calf   . POLYPECTOMY    . portacath removed  10/2007  . RIGHT/LEFT HEART CATH AND CORONARY ANGIOGRAPHY N/A 05/06/2017   Procedure: RIGHT/LEFT HEART CATH AND CORONARY ANGIOGRAPHY;  Surgeon: [name], MD;  Location: [place] [facility];  Service: Cardiovascular;  Laterality: N/A;  . SPIROMETRY  07/03/2002   min obstruction,mild restriction 01/31/2005  . tvh  12/01/2000   hysterectomy     Current Outpatient Medications  Medication Sig Dispense Refill  . albuterol (PROAIR HFA) 108 (90 Base) MCG/ACT inhaler Inhale 2 puffs into the lungs every 6 (six) hours as needed for wheezing or shortness of breath. 3 Inhaler 3  . Albuterol Sulfate (PROAIR RESPICLICK) 962 (90 Base) MCG/ACT AEPB Inhale 2 puffs into the lungs every 6 (six) hours as needed. 3 each 3  . ALPRAZolam (XANAX) 0.5 MG tablet Take 1 tablet (0.5 mg total) by mouth 2 (two) times daily as needed for up to 1 dose for anxiety. 40 tablet 0  . atorvastatin (LIPITOR) 40 MG tablet Take 1 tablet (40 mg total) by mouth daily at 6 PM. 90 tablet 3  . BREO ELLIPTA 100-25 MCG/INH AEPB INHALE 1 PUFF BY MOUTH EVERY DAY 180 each 3  . carvedilol (COREG) 6.25 MG tablet Take 1 tablet (6.25 mg total) by mouth 2 (two) times daily with a meal. 180 tablet 3  . clopidogrel (PLAVIX) 75 MG tablet TAKE 1 TABLET(75 MG) BY MOUTH DAILY 90 tablet 3  . furosemide  (LASIX) 20 MG tablet TAKE 1 TABLET BY MOUTH EVERY DAY 90 tablet 0  . ipratropium-albuterol (DUONEB) 0.5-2.5 (3) MG/3ML SOLN USE 1 VIAL VIA NEBULIZER EVERY 6 HOURS AS NEEDED 75 mL 12  . losartan (COZAAR) 25 MG tablet Take 1 tablet (25 mg total) by mouth daily. 90 tablet 1  . nitroGLYCERIN (NITROSTAT) 0.4 MG SL tablet Place 1 tablet (0.4 mg total) under the tongue every 5 (five) minutes as needed for chest pain. 30 tablet 12  . pantoprazole (PROTONIX) 40 MG tablet     . spironolactone (ALDACTONE) 25 MG tablet Take 1 tablet (25 mg total) by mouth  daily. 90 tablet 3  . zolpidem (AMBIEN) 5 MG tablet 1 at bedtime for sleep as needed 30 tablet 5   No current facility-administered medications for this visit.     Allergies:   Patient has no known allergies.    Social History:  The patient  reports that she quit smoking about 17 years ago. Her smoking use included cigarettes. She started smoking about 40 years ago. She has a 46.00 pack-year smoking history. She has never used smokeless tobacco. She reports that she does not drink alcohol or use drugs.   Family History:  The patient's family history includes Asthma in her daughter; Breast cancer in her mother; COPD in her maternal grandmother and mother; Cancer in her paternal grandfather; Cirrhosis in her paternal uncle; Colon polyps in her brother; Diabetes in her maternal grandfather; Lung cancer in her father; Other in her maternal aunt and mother; Throat cancer in an other family member.    ROS:  Please see the history of present illness.   Otherwise, review of systems are positive for none.   All other systems are reviewed and negative.    PHYSICAL EXAM: VS:  BP 102/60   Pulse 83   Temp 98.4 F (36.9 C) (Temporal)   Ht '5\' 5"'  (1.651 m)   Wt 169 lb 3.2 oz (76.7 kg)   SpO2 95%   BMI 28.16 kg/m  , BMI Body mass index is 28.16 kg/m. GENERAL:  Well appearing HEENT:  Pupils equal round and reactive, fundi not visualized, oral mucosa  unremarkable NECK:  No jugular venous distention, waveform within normal limits, carotid upstroke brisk and symmetric, no bruits, no thyromegaly LYMPHATICS:  No cervical adenopathy LUNGS:  Clear to auscultation bilaterally HEART:  RRR.  PMI not displaced or sustained,S1 and S2 within normal limits, no S3, no S4, no clicks, no rubs,  murmurs ABD:  Flat, positive bowel sounds normal in frequency in pitch, no bruits, no rebound, no guarding, no midline pulsatile mass, no hepatomegaly, no splenomegaly EXT:  2 plus pulses throughout, no edema, no cyanosis no clubbing.  Fat pad posterior to R lateral malleolus SKIN:  No rashes no nodules NEURO:  Cranial nerves II through XII grossly intact, motor grossly intact throughout PSYCH:  Cognitively intact, oriented to person place and time   EKG:  EKG is ordered today. The ekg ordered today demonstrates sinus rhythm rate 83 bpm.  First degree AV block.  Nonspecific ST changes. QTc 470 ms.  Echo 09/16/2018: Study Conclusions  - Left ventricle: The cavity size was normal. Wall thickness was   normal. Systolic function was vigorous. The estimated ejection   fraction was in the range of 65% to 70%. Doppler parameters are   consistent with abnormal left ventricular relaxation (grade 1   diastolic dysfunction).  LHC 05/06/17: 60% ostial LM.  15% proximal RCA.  Recent Labs: 08/07/2018: BUN 22; Creatinine, Ser 1.12; Potassium 4.0; Sodium 140 02/10/2019: Hemoglobin 13.1; Platelets 240.0    Lipid Panel    Component Value Date/Time   CHOL 134 07/28/2018 1427   TRIG 93 07/28/2018 1427   HDL 38 (L) 07/28/2018 1427   CHOLHDL 3.5 07/28/2018 1427   VLDL 19 07/28/2018 1427   LDLCALC 77 07/28/2018 1427   LDLDIRECT 137 (H) 09/27/2009 2057      Wt Readings from Last 3 Encounters:  05/13/19 169 lb 3.2 oz (76.7 kg)  02/10/19 173 lb 6.4 oz (78.7 kg)  09/16/18 166 lb 6.4 oz (75.5 kg)  ASSESSMENT AND PLAN:  # Chronic systolic and diastolic heart  failure: LVEF recovered to 60-65%.  She is euvolemic on exam.  Continue carvedilol, losartan, and spironolactone.  # CAD s/p LAD PCI:  She is doing well and has no evidence of ischemia.  Continue lifelong clopidogrel.  Continue carvedilol and atorvastatin.  LDL was 83 on 08/2018.  Will repeat lipids and a CMP.  # Severe COPD: # Prior lung cancer: Continue supplemental oxygen overnight.  She reports that her daytime oxygen levels never dropped below 90.  She continues to abstain from smoking.   Current medicines are reviewed at length with the patient today.  The patient does not have concerns regarding medicines.  The following changes have been made:  no change  Labs/ tests ordered today include:  No orders of the defined types were placed in this encounter.    Disposition:   FU with Lillan Mccreadie C. Oval Linsey, MD, [facility] in 6 months.      Signed, Vadhir Mcnay C. Oval Linsey, MD, [facility]  05/13/2019 3:43 PM    [place]

## 2019-05-13 NOTE — Patient Instructions (Signed)
Medication Instructions:  Your physician recommends that you continue on your current medications as directed. Please refer to the Current Medication list given to you today.  If you need a refill on your cardiac medications before your next appointment, please call your pharmacy.   Lab work: NONE     Testing/Procedures: NONE  Follow-Up: At [facility], you and your health needs are our priority.  As part of our continuing mission to provide you with exceptional heart care, we have created designated Provider Care Teams.  These Care Teams include your primary Cardiologist (physician) and Advanced Practice Providers (APPs -  Physician Assistants and Nurse Practitioners) who all work together to provide you with the care you need, when you need it. You will need a follow up appointment in 6 months.  Please call our office 2 months in advance to schedule this appointment.  You may see DR [facility] or one of the following Advanced Practice Providers on your designated Care Team:   Kerin Ransom, PA-C Roby Lofts, [place] . Sande Rives, PA-C

## 2019-05-14 ENCOUNTER — Other Ambulatory Visit (INDEPENDENT_AMBULATORY_CARE_PROVIDER_SITE_OTHER): Payer: Medicare Other

## 2019-05-14 DIAGNOSIS — I5022 Chronic systolic (congestive) heart failure: Secondary | ICD-10-CM

## 2019-05-14 DIAGNOSIS — I38 Endocarditis, valve unspecified: Secondary | ICD-10-CM

## 2019-05-21 ENCOUNTER — Other Ambulatory Visit: Payer: Self-pay | Admitting: Internal Medicine

## 2019-05-21 DIAGNOSIS — J4541 Moderate persistent asthma with (acute) exacerbation: Secondary | ICD-10-CM

## 2019-06-14 ENCOUNTER — Ambulatory Visit (INDEPENDENT_AMBULATORY_CARE_PROVIDER_SITE_OTHER): Payer: Medicare Other | Admitting: Internal Medicine

## 2019-06-14 ENCOUNTER — Encounter: Payer: Self-pay | Admitting: Internal Medicine

## 2019-06-14 ENCOUNTER — Other Ambulatory Visit: Payer: Self-pay

## 2019-06-14 DIAGNOSIS — J9611 Chronic respiratory failure with hypoxia: Secondary | ICD-10-CM

## 2019-06-14 DIAGNOSIS — Z23 Encounter for immunization: Secondary | ICD-10-CM | POA: Diagnosis not present

## 2019-06-14 DIAGNOSIS — J449 Chronic obstructive pulmonary disease, unspecified: Secondary | ICD-10-CM

## 2019-06-14 MED ORDER — BREO ELLIPTA 100-25 MCG/INH IN AEPB: INHALATION_SPRAY | RESPIRATORY_TRACT | 4 refills | Status: DC

## 2019-06-14 MED ORDER — ALBUTEROL SULFATE HFA 108 (90 BASE) MCG/ACT IN AERS: 2.0000 | INHALATION_SPRAY | Freq: Four times a day (QID) | RESPIRATORY_TRACT | 4 refills | Status: DC | PRN

## 2019-06-14 NOTE — Patient Instructions (Addendum)
Order- Pneumovax 23  Refills sent  Please call if we can help

## 2019-06-14 NOTE — Progress Notes (Signed)
Patient ID: [name], female    DOB: Apr 07, 1966, 53 y.o.   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 Walk test on room air 06/28/2016-95%, 95%, 95%, 93%, peak heart rate 120/minute. No desaturation after 3185 feet. Office Spirometry 06/28/2016-limited validity due to cough. Restriction of exhaled volume. FVC 1.79/50%, FEV1 1.64/57%, ratio 0.80. FENO- 5 Walk Test on room Air- Qualified for home O2 04/10/17 PFT 06/17/17- severe obstruction, mild restriction, diffusion severely reduced. Insignificant response to bronchodilator. FVC 1.73/47%, FEV1 1.10/38%, ratio 0.64, TLC 77%, DLCO 32% CHF- acute hosp 05/2017- CHF, CAD, EF 20-25%. Stent. 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 5x qd. Would like prescription for O2 2L at night to be sent to [facility]. Current self-owned machine is acting up. C/O 40lb weight  gain. Body weight today 173 lbs( weighed 148 lbs 02/12/18, up to 164 by 06/23/18.) More DOE w ADLS but little cough or wheeze. Mild fluid retention but little cough or wheeze, no palpitation or chest pain.  Arrival sat today 91% room air.  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 Little routine cough, mostly dry. Feels well with no acute concerns.  CXR 02/10/2019- IMPRESSION: 1. 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 No contour change or new lesion identified. 2. Aortic Atherosclerosis (ICD10-I70.0) and Emphysema (ICD10-J43.9). Chronic interstitial accentuation, stable.  Review of Systems-+ = positive Constitutional:   No-   weight loss, night sweats, fevers, chills, fatigue, lassitude. HEENT:   No-  headaches, difficulty swallowing, tooth/dental problems, sore throat,      mild sneezing,no- itching, ear ache, nasal congestion, post nasal drip,  CV:  No-   chest pain, orthopnea, PND, swelling in lower extremities, anasarca, dizziness, palpitations Resp: + shortness of breath with exertion or at rest.              productive cough, + non-productive cough,  No- coughing up of blood.                change in color of mucus.  wheezing.  Skin: No-   rash or lesions. GI:  No-   heartburn, indigestion, abdominal pain, nausea, vomiting,  GU:   MS:  No-   joint pain or swelling.   Neuro-     nothing unusual Psych:  No- change in mood or affect. No depression or anxiety.  No memory loss.   Objective:   Physical Exam    Has POC General- Alert, Oriented, Affect-appropriate, Distress - NAD, + overweight   Skin- rash-none, lesions- none, excoriation- none.  Lymphadenopathy- none Head- atraumatic            Eyes- Gross vision intact, PERRLA, conjunctivae clear secretions            Ears- Hearing, canals-normal            Nose- , no-Septal dev, mucus, polyps, erosion, perforation             Throat- Mallampati IVI , mucosa clear , drainage- none, tonsils- atrophic,  Neck- flexible , trachea midline, no stridor , thyroid nl, carotid no bruit Chest - symmetrical excursion , unlabored           Heart/CV- RRR rapid , no murmur , no gallop  , no rub, nl s1 s2                           - JVD none , edema- none, stasis changes- none, varices- none           Lung-   Clear, Wheeze- none, cough-none, dullness-none, rub- none. Rales- none           Chest wall-  Abd-  Br/ Gen/ Rectal- Not done, not indicated Extrem- cyanosis- none, clubbing, none, atrophy- none, strength- nl,    Neuro- grossly intact to observation

## 2019-06-14 NOTE — Assessment & Plan Note (Signed)
Has concentrator and POC from [employer] [facility] Continues to need and benefit.

## 2019-06-14 NOTE — Assessment & Plan Note (Signed)
Well controlled currently. Discussed pneumonia vaccine.  Plan- pneumovax-23, refill inhalers

## 2019-06-21 ENCOUNTER — Other Ambulatory Visit: Payer: Self-pay | Admitting: Internal Medicine

## 2019-06-23 ENCOUNTER — Other Ambulatory Visit: Payer: Self-pay | Admitting: *Deleted

## 2019-06-23 DIAGNOSIS — C3491 Malignant neoplasm of unspecified part of right bronchus or lung: Secondary | ICD-10-CM

## 2019-06-23 NOTE — Progress Notes (Signed)
[name]  [phone] [phone]     ID: ABY GESSEL DOB: 1966-08-16  MR#: [id number]  TKW#:[id number]  Patient Care Team: Lori Battles, MD as PCP - General (Internal Medicine) Lori Lever, MD (Pulmonary Disease) Lori Sine, MD as Consulting Physician (Dermatology) Armbruster, Lori Raspberry, MD as Consulting Physician (Gastroenterology) [name], Lori Dad, MD as Consulting Physician (Oncology) Lori Dresser, MD as Consulting Physician (Cardiology) OTHER MD: [name] M.D., [name] M.D., Lori Skeans MD  CHIEF COMPLAINT: malignant melanoma, colon carcinoma, lung cancer  CURRENT TREATMENT: Observation   INTERVAL HISTORY: [name] returns today for follow-up of her remote lung cancer, and her more recent colon and melanoma cancers, as well as for routine health maintenance.    Since her last visit, she underwent bilateral diagnostic mammography with tomography at [facility] on 07/03/2018 showing: breast density category C; no evidence of malignancy in either breast.  She also underwent routine chest x-ray on 02/10/2019, which showed no change or new lesion.  Recall she underwent cardiac catheterization on 05/12/2017 with the placement of a Sierra 15 mm drug-eluting stent 4 and ostial left main lesion.  She continues on antiplatelet agents, with some bruising but no overt bleeding   REVIEW OF SYSTEMS: Gusta is now on 24-hour oxygen.  She says that she occasionally has shortness of breath problems's accompanied by wheezing and she uses the oxygen but otherwise sometimes she keeps the oxygen off.  She does have a portable concentrator with her today which weighs about 6 pounds and she carries easily.  She is using nasal cannula.  She is taking appropriate pandemic precautions.  She only gets out of the house to go to the doctor or to go shopping for food.  A detailed review of systems today was otherwise stable.    HISTORY OF PRESENT ILLNESS:  From the prior summary:  I know [name] from her remote history of lung carcinoma, which has not recurred. More recently, her significant other [name] noticed a lesion in her right posterior calf. He keeps a picture Atlas of melanoma lesions on his bathroom wall, and told her he was "99% sure" the lesion there would be a melanoma. The patient brought it to Dr. Marjean [name] attention and a punch biopsy was obtained 09/06/2014. This showed ([id number]) malignant melanoma, with a maximum thickness of 0.33 mm, anatomic level III, no ulceration, with negative deep margins but clear peripheral margins. The mitotic index was low, less than one per square millimeter. There was no evidence of lymphovascular invasion. There was brisk tumor infiltrating lymphocytes. Tumor regression was noted across the full aspect of the punch biopsy.   This was read as a pathologic stage TIa, and the patient was referred to Dr. Sarajane [name] for wide excision, performed 09/13/2014. Dr. Everett [name] note indicates a 1 cm margin around the initial biopsy site. The final pathology ([id number]) showed a tumor thickness of 1.15, anatomic level III, with ulceration seen only in the area of prior biopsy, (and therefore read as negative). Peripheral as well as deep margins were free. Again the mitotic index was low there was no evidence of vascular invasion and tumor infiltrating lymphocytes were brisk. This was read as a pT2a lesion.   The patient is referred for further evaluation and treatment.   PAST MEDICAL HISTORY: Past Medical History:  Diagnosis Date  . Acute bilateral low back pain without sciatica   . Acute congestive heart failure ([place]) 05/02/2017   EF 20-25% on  ECHO  . Acute respiratory failure with hypoxia ([place]) 05/01/2017  . 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 Allergic rhinitis   . Allergy   . Anxiety disorder   . Aortic valve regurgitation 05/15/2017  . Asthma   . Chronic bronchitis    . Colon cancer ([place])    adenocarcinoma in a polyps 11-24-2014  . COPD (chronic obstructive pulmonary disease) ([place])   . Cough   . Dizziness and giddiness   . Dyspepsia   . Dyspnea 04/30/2017  . Emphysema of lung ([place])   . External hemorrhoids   . EXTERNAL HEMORRHOIDS 09/27/2009  . Female stress incontinence   . Genetic testing 08/18/2016   Negative for known pathogenic mutations within any of 25 genes on a Custom Panel through [employer].  One variant of uncertain significance (VUS) called "c.7187C>G (p.Thr2396Ser)" was found in one copy of the ATM gene.  This Custom Cancer Panel offered by [facility] includes sequencing and/or duplication/deletion testing of the following 25 genes: APC, ATM, AXIN2, BAP1, BMPR1A, BRCA1, BRCA2, CDH1, CDK4, CDKN2A, CHEK2, EPCAM, MITF, MLH1, MSH2, MSH6, MUTYH, PMS2, POLD1, POLE, PTEN, SCG5/GREM1, SMAD4, STK11, and TP53. Date of report is July 23, 2016.  MSH2 Exons 1-7 Inversion Analysis was also negative through [employer].  Date of report is July 23, 2016.     Marland Kitchen GERD (gastroesophageal reflux disease)    occ depending on diet   . Goiter   . History of lung cancer   . Hypercholesteremia    denies-last check normal labs  . Hypokalemia   . Idiopathic interstitial pneumonia, not otherwise specified ([place])   . Insomnia   . Lung cancer ([name])   . Migraine, unspecified, without mention of intractable migraine without mention of status migrainosus   . Panic attacks   . Seizures ([name])    very long ago like 15 years ago or so , questionable etilogy  . Skin cancer (melanoma) ([name])    right calf  . Snoring 12/11/2010   Longstanding hx of snore. NPSG was normal years ago.    Marland Kitchen Swelling of right lower extremity 12/01/2014    PAST SURGICAL HISTORY: Past Surgical History:  Procedure Laterality Date  . ABDOMINAL HYSTERECTOMY  2001   no BSO  . biopsy of right neck mass     negative-thyroid biopsy   . BREAST EXCISIONAL BIOPSY Right 1987   no visible scar   . BRONCHOSCOPY  01/31/2005   nonspecific inflammation  . BRONCHOSCOPY  08/24/2010   nonspecific inflammation  . COLONOSCOPY    . CORONARY STENT INTERVENTION N/A 05/12/2017   Procedure: CORONARY STENT INTERVENTION;  Surgeon: Charolette Forward, MD;  Location: [facility] [facility];  Service: Cardiovascular;  Laterality: N/A;  . LUNG REMOVAL, PARTIAL     for lung cancer--followed by Dr. Arlyce Dice and Dr. Annamaria Boots  . MELANOMA EXCISION WITH SENTINEL LYMPH NODE BIOPSY Right 11/29/2014   Procedure: RIGHT INGUINAL SENTINEL LYMPH NODE BIOPSY;  Surgeon: Lori Skeans, MD;  Location: [facility];  Service: General;  Laterality: Right;  . melanoma removal     right calf   . POLYPECTOMY    . portacath removed  10/2007  . RIGHT/LEFT HEART CATH AND CORONARY ANGIOGRAPHY N/A 05/06/2017   Procedure: RIGHT/LEFT HEART CATH AND CORONARY ANGIOGRAPHY;  Surgeon: [name], MD;  Location: [facility];  Service: Cardiovascular;  Laterality: N/A;  . SPIROMETRY  07/03/2002   min obstruction,mild restriction 01/31/2005  . tvh  12/01/2000   hysterectomy    FAMILY HISTORY Family History  Problem Relation Age of Onset  . Lung cancer Father        smoker and worked at a Academic librarian  . COPD Mother        not a smoker  . Other Mother        hx of hysterectomy for unspecified reason  . Breast cancer Mother   . Asthma Daughter   . Diabetes Maternal Grandfather   . Colon polyps Brother        approx 16 polyps on his first colonoscopy  . Other Maternal Aunt        non-cancerous growth in lungs; respiratory issues; smoker  . COPD Maternal Grandmother        d. 27  . Cancer Paternal Grandfather        oral/mouth cancer; chewed tobacco; d. older age  . Throat cancer Other        maternal great aunt (MGF's sister); not a smoker  . Cirrhosis Paternal Uncle        hx of alcohol abuse   . Colon cancer Neg Hx   . Rectal cancer Neg Hx   . Stomach cancer Neg Hx    the patient's father died in his mid 70s from lung cancer. The  patient's mother is living, in her 48s. The patient has one brother, no sisters.    GYNECOLOGIC HISTORY:  No LMP recorded. Patient has had a hysterectomy. Menarche age 58, first live birth age 22. The patient is GX P1. She had a hysterectomy approximately 2001. She did not undergo salpingo-oophorectomy. She did not take hormone replacement   SOCIAL HISTORY:  She is disabled and lives by herself, with a cat. She is divorced. Her significant other, Aaron Edelman, works as a Chief Strategy Officer and comes by occasionally.. Her daughter Theadora Rama, 11 years old as of July 2019, lives in [place] and in addition to working as a Engineer, materials has 6 children to take care of.  She works out of her home.  Her husband is Freight forwarder at [employer].    ADVANCED DIRECTIVES: Not in place. The patient intends to name her mother Worthy Flank as healthcare power of attorney. She can be reached at [phone]. At the 11/07/2014 visit patient received the appropriate documents 2 complete and notarize at her discretion   HEALTH MAINTENANCE: Social History   Tobacco Use  . Smoking status: Former Smoker    Packs/day: 2.00    Years: 23.00    Pack years: 46.00    Types: Cigarettes    Start date: 09/02/1978    Quit date: 10/26/2001    Years since quitting: 17.6  . Smokeless tobacco: Never Used  . Tobacco comment: Significant passive exposure from boy friend  Substance Use Topics  . Alcohol use: No    Alcohol/week: 0.0 standard drinks    Comment: history of alcohol abuse 20-30  . Drug use: No    Comment: +MJ      Colonoscopy: 12/2016, normal (due 01/2020)  PAP: Status post hysterectomy  Bone density: never  Lipid profile: 06/16/2017, good   No Known Allergies  Current Outpatient Medications  Medication Sig Dispense Refill  . albuterol (VENTOLIN HFA) 108 (90 Base) MCG/ACT inhaler INHALE 2 PUFFS INTO THE LUNGS EVERY 6 HOURS AS NEEDED FOR WHEEZING AND SHORTNESS OF BREATH 25.5 g 6  . ALPRAZolam (XANAX) 0.5 MG tablet Take 1  tablet (0.5 mg total) by mouth 2 (two) times daily as needed for up to 1 dose for anxiety. 40 tablet 0  .  atorvastatin (LIPITOR) 40 MG tablet Take 1 tablet (40 mg total) by mouth daily at 6 PM. 90 tablet 3  . carvedilol (COREG) 6.25 MG tablet Take 1 tablet (6.25 mg total) by mouth 2 (two) times daily with a meal. 180 tablet 3  . clopidogrel (PLAVIX) 75 MG tablet TAKE 1 TABLET(75 MG) BY MOUTH DAILY 90 tablet 3  . fluticasone furoate-vilanterol (BREO ELLIPTA) 100-25 MCG/INH AEPB INHALE 1 PUFF BY MOUTH INTO THE LUNGS DAILY 240 each 4  . furosemide (LASIX) 20 MG tablet Take 1 tablet (20 mg total) by mouth daily. 90 tablet 3  . ipratropium-albuterol (DUONEB) 0.5-2.5 (3) MG/3ML SOLN USE 1 VIAL VIA NEBULIZER EVERY 6 HOURS AS NEEDED 75 mL 12  . losartan (COZAAR) 25 MG tablet Take 1 tablet (25 mg total) by mouth daily. 90 tablet 3  . nitroGLYCERIN (NITROSTAT) 0.4 MG SL tablet Place 1 tablet (0.4 mg total) under the tongue every 5 (five) minutes as needed for chest pain. 30 tablet 12  . pantoprazole (PROTONIX) 40 MG tablet     . potassium chloride (KLOR-CON) 10 MEQ tablet Take 1 tablet (10 mEq total) by mouth daily. 90 tablet 4  . spironolactone (ALDACTONE) 25 MG tablet Take 1 tablet (25 mg total) by mouth daily. 90 tablet 3  . zolpidem (AMBIEN) 5 MG tablet 1 at bedtime for sleep as needed 30 tablet 5   No current facility-administered medications for this visit.     OBJECTIVE: Middle-aged white woman wearing oxygen by nasal cannula  Vitals:   06/24/19 1116  BP: 105/80  Pulse: 81  Resp: 18  Temp: 98.3 F (36.8 C)  SpO2: 93%     Body mass index is 28.84 kg/m.    ECOG FS:1 - Symptomatic but completely ambulatory  Sclerae unicteric, EOMs intact Wearing a mask No cervical or supraclavicular adenopathy Lungs no rales or rhonchi Heart regular rate and rhythm Abd soft, nontender, positive bowel sounds MSK no focal spinal tenderness, no upper extremity lymphedema Neuro: nonfocal, well oriented,  appropriate affect Breasts: No masses or discharge, no suspicious findings, both axillae are benign.     LAB RESULTS:  CMP     Component Value Date/Time   NA 142 06/24/2019 1055   NA 140 12/05/2017 1459   NA 140 06/20/2017 1234   K 3.2 (L) 06/24/2019 1055   K 3.6 06/20/2017 1234   CL 105 06/24/2019 1055   CL 111 (H) 07/09/2012 0958   CO2 27 06/24/2019 1055   CO2 26 06/20/2017 1234   GLUCOSE 84 06/24/2019 1055   GLUCOSE 91 06/20/2017 1234   GLUCOSE 80 07/09/2012 0958   BUN 9 06/24/2019 1055   BUN 12 12/05/2017 1459   BUN 9.8 06/20/2017 1234   CREATININE 0.83 06/24/2019 1055   CREATININE 0.8 06/20/2017 1234   CALCIUM 9.0 06/24/2019 1055   CALCIUM 9.4 06/20/2017 1234   PROT 7.2 06/24/2019 1055   PROT 8.0 06/20/2017 1234   ALBUMIN 4.0 06/24/2019 1055   ALBUMIN 4.2 06/20/2017 1234   AST 11 (L) 06/24/2019 1055   AST 15 06/20/2017 1234   ALT 9 06/24/2019 1055   ALT 9 06/20/2017 1234   ALKPHOS 119 06/24/2019 1055   ALKPHOS 95 06/20/2017 1234   BILITOT 0.8 06/24/2019 1055   BILITOT 1.04 06/20/2017 1234   GFRNONAA >60 06/24/2019 1055   GFRAA >60 06/24/2019 1055    INo results found for: SPEP, UPEP  Lab Results  Component Value Date   WBC 6.7 06/24/2019   NEUTROABS  4.7 06/24/2019   HGB 12.1 06/24/2019   HCT 35.2 (L) 06/24/2019   MCV 84.0 06/24/2019   PLT 215 06/24/2019      Chemistry      Component Value Date/Time   NA 142 06/24/2019 1055   NA 140 12/05/2017 1459   NA 140 06/20/2017 1234   K 3.2 (L) 06/24/2019 1055   K 3.6 06/20/2017 1234   CL 105 06/24/2019 1055   CL 111 (H) 07/09/2012 0958   CO2 27 06/24/2019 1055   CO2 26 06/20/2017 1234   BUN 9 06/24/2019 1055   BUN 12 12/05/2017 1459   BUN 9.8 06/20/2017 1234   CREATININE 0.83 06/24/2019 1055   CREATININE 0.8 06/20/2017 1234      Component Value Date/Time   CALCIUM 9.0 06/24/2019 1055   CALCIUM 9.4 06/20/2017 1234   ALKPHOS 119 06/24/2019 1055   ALKPHOS 95 06/20/2017 1234   AST 11 (L)  06/24/2019 1055   AST 15 06/20/2017 1234   ALT 9 06/24/2019 1055   ALT 9 06/20/2017 1234   BILITOT 0.8 06/24/2019 1055   BILITOT 1.04 06/20/2017 1234       Lab Results  Component Value Date   LABCA2 26 07/21/2007    No components found for: [id number]  No results for input(s): INR in the last 168 hours.  Urinalysis    Component Value Date/Time   LABSPEC 1.005 07/16/2012 1207   PHURINE 6.0 07/16/2012 1207   HGBUR Negative 07/16/2012 1207   BILIRUBINUR Negative 07/16/2012 1207   KETONESUR Negative 07/16/2012 1207   PROTEINUR Negative 07/16/2012 1207   NITRITE Negative 07/16/2012 1207   LEUKOCYTESUR Trace 07/16/2012 1207    STUDIES: No results found.   ASSESSMENT: 53 y.o. [place] woman  A: LUNG CANCER  (1) status post right upper lobe wedge resection of a T1 N2, stage IIIA non-small cell lung cancer (adenocarcinoma) measuring 1.8 cm, poorly differentiated, with negative margins, but with 4 positive lymph nodes, 3 N1 and 1 N2;   (a) on [place] 2004 to 2009  (b) no evidence of disease recurrence on most recent scans, and likely cured  (2) ashthmatic bronchitis/ COPD  (3) tobacco abuse: The patient quit smoking 2001  B: MALIGNANT MELANOMA  (4) s/p punch biopsy Right posterior calf for malignant melanoma 0.33 mm thickness, level III, w/o ulceration and with a low mitotic index, free deep but positive lateral margins  (5) status post wide excision 09/13/2014 showing a maximum tumor thickness of 1.15 mm, and anatomic level III, with ulceration noted only at the site of prior biopsy, with clear margins (1 cm per surgical note) with a low mitotic index and brisk infiltration by tumor infiltrating lymphocytes: pT2a NX MX = stage IB  (6) right inguinal sentinel lymph node biopsy 11/29/2014 showed no evidence of melanoma  C: COLON CANCER (Armbruster following) (7) colonoscopy 11/29/2014 shows adenocarcinoma in ascending colon polyp; repeat colonoscopy 12/30/2014 removed a  tubular adenoma in that area which was negative for malignancy. The area was marked for possible definitive surgery  (a) Repeat colonoscopy with polypectomy 12/30/2014 showed tubular adenomas.  (b) repeat colonoscopy 11/29/2015 showed an additional 4-5 adenomatous, which were removed.  Repeat colonoscopy was recommended in 1 year  (c) colonoscopy 01/10/2017 showed no new polyps.  Repeat colonoscopy in 2021 recommended  D: GENETICS (8) Genetic testing 07/23/2016 through the [facility] Panel (with MSH2 Exons 1-7 Inversion Analysis) found no deleterious mutations.in APC, ATM, AXIN2, BAP1, BMPR1A, BRCA1, BRCA2, CDH1, CDK4, CDKN2A, CHEK2, EPCAM,  MITF, MLH1, MSH2, MSH6, MUTYH, PMS2, POLD1, POLE, PTEN, SCG5/GREM1, SMAD4, STK11, and TP53.   (a) One variant of uncertain significance (VUS) called "c.7187C>G (p.Thr2396Ser)" was found in one copy of the ATM gene  E: Coronary artery disease status post stenting September 2018    PLAN: Aaren is now 16 years out from her lung cancer surgery with no evidence of disc recurrence.  This is very favorable.  She does have significant COPD although she quit smoking 20 years ago.  This is followed closely by Dr. Annamaria Boots.   She is 4-1/2 years out from her diagnosis of colon carcinoma.  She had multiple colonoscopies until the most recent one 2 years ago which showed no additional polyps.  She will be scheduled for repeat colonoscopy under Dr. Havery Moros next year.  She is also about 5 years out from her diagnosis of malignant melanoma.  She sees Dr. Elvera Lennox once a year for screening.  Today she looks fine other than the fact that her potassium is a little bit low.  She is on Lasix.  I have written her to take potassium 10 mEq daily and suggested she eat food on a daily basis.  Breast exam is unremarkable.  She is due for repeat mammography next month and that order has been placed.  She knows to call us for any other issue that may develop before the  next visit.   Samari Bittinger, Lori Dad, MD  06/24/19 11:49 AM Medical Oncology and Hematology [facility] [address] [place],  [address] Tel. [phone]    Fax. [phone]    I, Wilburn Mylar am acting as scribe for Dr. Virgie [name]. Crayton Savarese.  I, Lurline Del MD, have reviewed the above documentation for accuracy and completeness, and I agree with the above.

## 2019-06-24 ENCOUNTER — Inpatient Hospital Stay: Payer: Medicare Other | Attending: Oncology

## 2019-06-24 ENCOUNTER — Other Ambulatory Visit: Payer: Self-pay

## 2019-06-24 ENCOUNTER — Other Ambulatory Visit: Payer: Self-pay | Admitting: Internal Medicine

## 2019-06-24 ENCOUNTER — Inpatient Hospital Stay (HOSPITAL_BASED_OUTPATIENT_CLINIC_OR_DEPARTMENT_OTHER): Payer: Medicare Other | Admitting: Oncology

## 2019-06-24 VITALS — BP 105/80 | HR 81 | Temp 98.3°F | Resp 18 | Ht 65.0 in | Wt 173.3 lb

## 2019-06-24 DIAGNOSIS — Z87891 Personal history of nicotine dependence: Secondary | ICD-10-CM | POA: Diagnosis not present

## 2019-06-24 DIAGNOSIS — Z801 Family history of malignant neoplasm of trachea, bronchus and lung: Secondary | ICD-10-CM | POA: Insufficient documentation

## 2019-06-24 DIAGNOSIS — I2510 Atherosclerotic heart disease of native coronary artery without angina pectoris: Secondary | ICD-10-CM | POA: Insufficient documentation

## 2019-06-24 DIAGNOSIS — Z79899 Other long term (current) drug therapy: Secondary | ICD-10-CM | POA: Insufficient documentation

## 2019-06-24 DIAGNOSIS — C3491 Malignant neoplasm of unspecified part of right bronchus or lung: Secondary | ICD-10-CM | POA: Diagnosis not present

## 2019-06-24 DIAGNOSIS — J449 Chronic obstructive pulmonary disease, unspecified: Secondary | ICD-10-CM | POA: Diagnosis not present

## 2019-06-24 DIAGNOSIS — Z803 Family history of malignant neoplasm of breast: Secondary | ICD-10-CM | POA: Diagnosis not present

## 2019-06-24 DIAGNOSIS — C189 Malignant neoplasm of colon, unspecified: Secondary | ICD-10-CM | POA: Diagnosis not present

## 2019-06-24 DIAGNOSIS — Z7951 Long term (current) use of inhaled steroids: Secondary | ICD-10-CM | POA: Insufficient documentation

## 2019-06-24 DIAGNOSIS — Z955 Presence of coronary angioplasty implant and graft: Secondary | ICD-10-CM | POA: Diagnosis not present

## 2019-06-24 DIAGNOSIS — C4371 Malignant melanoma of right lower limb, including hip: Secondary | ICD-10-CM | POA: Diagnosis not present

## 2019-06-24 DIAGNOSIS — Z90710 Acquired absence of both cervix and uterus: Secondary | ICD-10-CM | POA: Diagnosis not present

## 2019-06-24 DIAGNOSIS — Z85038 Personal history of other malignant neoplasm of large intestine: Secondary | ICD-10-CM | POA: Diagnosis not present

## 2019-06-24 DIAGNOSIS — Z85118 Personal history of other malignant neoplasm of bronchus and lung: Secondary | ICD-10-CM | POA: Diagnosis present

## 2019-06-24 DIAGNOSIS — Z85820 Personal history of malignant melanoma of skin: Secondary | ICD-10-CM | POA: Insufficient documentation

## 2019-06-24 LAB — CBC WITH DIFFERENTIAL (CANCER CENTER ONLY)
Abs Immature Granulocytes: 0.01 10*3/uL (ref 0.00–0.07)
Basophils Absolute: 0 10*3/uL (ref 0.0–0.1)
Basophils Relative: 0 %
Eosinophils Absolute: 0.1 10*3/uL (ref 0.0–0.5)
Eosinophils Relative: 2 %
HCT: 35.2 % — ABNORMAL LOW (ref 36.0–46.0)
Hemoglobin: 12.1 g/dL (ref 12.0–15.0)
Immature Granulocytes: 0 %
Lymphocytes Relative: 18 %
Lymphs Abs: 1.2 10*3/uL (ref 0.7–4.0)
MCH: 28.9 pg (ref 26.0–34.0)
MCHC: 34.4 g/dL (ref 30.0–36.0)
MCV: 84 fL (ref 80.0–100.0)
Monocytes Absolute: 0.6 10*3/uL (ref 0.1–1.0)
Monocytes Relative: 9 %
Neutro Abs: 4.7 10*3/uL (ref 1.7–7.7)
Neutrophils Relative %: 71 %
Platelet Count: 215 10*3/uL (ref 150–400)
RBC: 4.19 MIL/uL (ref 3.87–5.11)
RDW: 13.8 % (ref 11.5–15.5)
WBC Count: 6.7 10*3/uL (ref 4.0–10.5)
nRBC: 0 % (ref 0.0–0.2)

## 2019-06-24 LAB — CMP (CANCER CENTER ONLY)
ALT: 9 U/L (ref 0–44)
AST: 11 U/L — ABNORMAL LOW (ref 15–41)
Albumin: 4 g/dL (ref 3.5–5.0)
Alkaline Phosphatase: 119 U/L (ref 38–126)
Anion gap: 10 (ref 5–15)
BUN: 9 mg/dL (ref 6–20)
CO2: 27 mmol/L (ref 22–32)
Calcium: 9 mg/dL (ref 8.9–10.3)
Chloride: 105 mmol/L (ref 98–111)
Creatinine: 0.83 mg/dL (ref 0.44–1.00)
GFR, Est AFR Am: >60 mL/min (ref >60)
GFR, Estimated: >60 mL/min (ref >60)
Glucose, Bld: 84 mg/dL (ref 70–99)
Potassium: 3.2 mmol/L — ABNORMAL LOW (ref 3.5–5.1)
Sodium: 142 mmol/L (ref 135–145)
Total Bilirubin: 0.8 mg/dL (ref 0.3–1.2)
Total Protein: 7.2 g/dL (ref 6.5–8.1)

## 2019-06-24 MED ORDER — POTASSIUM CHLORIDE ER 10 MEQ PO TBCR: 10.0000 meq | EXTENDED_RELEASE_TABLET | Freq: Every day | ORAL | 4 refills | Status: DC

## 2019-06-25 ENCOUNTER — Other Ambulatory Visit: Payer: Self-pay | Admitting: Internal Medicine

## 2019-06-28 NOTE — Telephone Encounter (Signed)
Rx Request for Ambien 5 mg. Pt last seen 06/14/2019 w/ CY. Ambien last filled 12/10/2018 30 tablet x5 refills.   Current Outpatient Medications on File Prior to Visit  Medication Sig Dispense Refill  . albuterol (VENTOLIN HFA) 108 (90 Base) MCG/ACT inhaler INHALE 2 PUFFS INTO THE LUNGS EVERY 6 HOURS AS NEEDED FOR WHEEZING AND SHORTNESS OF BREATH 25.5 g 6  . ALPRAZolam (XANAX) 0.5 MG tablet Take 1 tablet (0.5 mg total) by mouth 2 (two) times daily as needed for up to 1 dose for anxiety. 40 tablet 0  . atorvastatin (LIPITOR) 40 MG tablet Take 1 tablet (40 mg total) by mouth daily at 6 PM. 90 tablet 3  . carvedilol (COREG) 6.25 MG tablet Take 1 tablet (6.25 mg total) by mouth 2 (two) times daily with a meal. 180 tablet 3  . clopidogrel (PLAVIX) 75 MG tablet TAKE 1 TABLET(75 MG) BY MOUTH DAILY 90 tablet 3  . fluticasone furoate-vilanterol (BREO ELLIPTA) 100-25 MCG/INH AEPB INHALE 1 PUFF BY MOUTH INTO THE LUNGS DAILY 240 each 4  . furosemide (LASIX) 20 MG tablet Take 1 tablet (20 mg total) by mouth daily. 90 tablet 3  . ipratropium-albuterol (DUONEB) 0.5-2.5 (3) MG/3ML SOLN USE 1 VIAL VIA NEBULIZER EVERY 6 HOURS AS NEEDED 75 mL 12  . losartan (COZAAR) 25 MG tablet Take 1 tablet (25 mg total) by mouth daily. 90 tablet 3  . nitroGLYCERIN (NITROSTAT) 0.4 MG SL tablet Place 1 tablet (0.4 mg total) under the tongue every 5 (five) minutes as needed for chest pain. 30 tablet 12  . pantoprazole (PROTONIX) 40 MG tablet     . potassium chloride (KLOR-CON) 10 MEQ tablet Take 1 tablet (10 mEq total) by mouth daily. 90 tablet 4  . spironolactone (ALDACTONE) 25 MG tablet Take 1 tablet (25 mg total) by mouth daily. 90 tablet 3  . zolpidem (AMBIEN) 5 MG tablet 1 at bedtime for sleep as needed 30 tablet 5   No current facility-administered medications on file prior to visit.    No Known Allergies   CY, please advise on the refill of Ambien for pt. Thank you.

## 2019-06-28 NOTE — Telephone Encounter (Signed)
ambien refill e-sent

## 2019-07-13 ENCOUNTER — Other Ambulatory Visit: Payer: Self-pay | Admitting: Oncology

## 2019-07-13 DIAGNOSIS — Z1231 Encounter for screening mammogram for malignant neoplasm of breast: Secondary | ICD-10-CM

## 2019-07-20 ENCOUNTER — Other Ambulatory Visit (HOSPITAL_COMMUNITY): Payer: Self-pay

## 2019-07-20 MED ORDER — LOSARTAN POTASSIUM 25 MG PO TABS: 25.0000 mg | ORAL_TABLET | Freq: Every day | ORAL | 3 refills | Status: DC

## 2019-08-17 ENCOUNTER — Other Ambulatory Visit: Payer: Self-pay | Admitting: Internal Medicine

## 2019-08-30 ENCOUNTER — Encounter: Payer: Self-pay | Admitting: Internal Medicine

## 2019-08-30 ENCOUNTER — Ambulatory Visit (INDEPENDENT_AMBULATORY_CARE_PROVIDER_SITE_OTHER): Payer: Medicare Other | Admitting: Internal Medicine

## 2019-08-30 ENCOUNTER — Telehealth: Payer: Self-pay | Admitting: Internal Medicine

## 2019-08-30 ENCOUNTER — Other Ambulatory Visit: Payer: Self-pay

## 2019-08-30 DIAGNOSIS — J189 Pneumonia, unspecified organism: Secondary | ICD-10-CM

## 2019-08-30 DIAGNOSIS — J449 Chronic obstructive pulmonary disease, unspecified: Secondary | ICD-10-CM | POA: Diagnosis not present

## 2019-08-30 DIAGNOSIS — J9611 Chronic respiratory failure with hypoxia: Secondary | ICD-10-CM

## 2019-08-30 MED ORDER — PROMETHAZINE-CODEINE 6.25-10 MG/5ML PO SYRP: 5.0000 mL | ORAL_SOLUTION | Freq: Four times a day (QID) | ORAL | 0 refills | Status: DC | PRN

## 2019-08-30 NOTE — Telephone Encounter (Signed)
noted 

## 2019-08-30 NOTE — Telephone Encounter (Signed)
Called and spoke with pt. Pt said she saw PCP and had covid test performed which was negative. Pt was told that she does have pna and was put on levofloxacin as well as prednisone.  Sending to Dr. Annamaria Boots as an Juluis Rainier.

## 2019-08-30 NOTE — Patient Instructions (Signed)
Sorry you don't feel well, but glad your primary doctor was able to start treatment today and get a Covid test started.  Script sent for cough medicine  Please call if we can help

## 2019-08-30 NOTE — Progress Notes (Signed)
Patient ID: MYKAH BELLOMO, female    DOB: 1966/04/26, 53 y.o.   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 Walk test on room air 06/28/2016-95%, 95%, 95%, 93%, peak heart rate 120/minute. No desaturation after 3185 feet. Office Spirometry 06/28/2016-limited validity due to cough. Restriction of exhaled volume. FVC 1.79/50%, FEV1 1.64/57%, ratio 0.80. FENO- 5 Walk Test on room Air- Qualified for home O2 04/10/17 PFT 06/17/17- severe obstruction, mild restriction, diffusion severely reduced. Insignificant response to bronchodilator. FVC 1.73/47%, FEV1 1.10/38%, ratio 0.64, TLC 77%, DLCO 32% CHF- acute hosp 05/2017- CHF, CAD, EF 20-25%. Stent. 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 Little routine cough, mostly dry. Feels well with no acute concerns.  CXR 02/10/2019- IMPRESSION: 1. 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 No contour change or new lesion identified. 2. Aortic Atherosclerosis (ICD10-I70.0) and Emphysema (ICD10-J43.9). Chronic interstitial accentuation, stable.  08/30/2019- Virtual Visit via Telephone Note  I connected with [name] on 08/30/19 at  4:30 PM EST by telephone and verified that I am speaking with the correct person using two identifiers.  Location: Patient: [name]   I discussed the limitations, risks, security and privacy concerns of performing an evaluation and management service by telephone and the availability of in person appointments. I also discussed with the patient that there may be a patient responsible charge related to this service. The patient expressed understanding and agreed to proceed.   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  Now asks a codeine cough syrup.   Observations/Objective:   Assessment and Plan: Chronic resp failure w hypoxia- needs to continue O2 CAP- cough syrup Rx'd, to continue levaquin and pred per pcp  Follow Up Instructions:    I discussed the assessment and treatment plan with the patient.  The patient was provided an opportunity to ask questions and all were answered. The patient agreed with the plan and demonstrated an understanding of the instructions.   The patient was advised to call  back or seek an in-person evaluation if the symptoms worsen or if the condition fails to improve as anticipated.  I provided 15 minutes of non-face-to-face time during this encounter.   Baird Lyons, MD    Review of Systems-+ = positive Constitutional:   No-   weight loss, night sweats, fevers, chills, fatigue, lassitude. HEENT:   No-  headaches, difficulty swallowing, tooth/dental problems, sore throat,      mild sneezing,no- itching, ear ache, nasal congestion, post nasal drip,  CV:  No-   chest pain, orthopnea, PND, swelling in lower extremities, anasarca, dizziness, palpitations Resp: + shortness of breath with exertion or at rest.              +productive cough, + non-productive cough,  No- coughing up of blood.               change in color of mucus. + wheezing.   Skin: No-   rash or lesions. GI:  No-   heartburn, indigestion, abdominal pain, nausea, vomiting,  GU:   MS:  No-   joint pain or swelling.   Neuro-     nothing unusual Psych:  No- change in mood or affect. No depression or anxiety.  No memory loss.   Objective:   Physical Exam    Has POC General- Alert, Oriented, Affect-appropriate, Distress - NAD, + overweight   Skin- rash-none, lesions- none, excoriation- none.  Lymphadenopathy- none Head- atraumatic            Eyes- Gross vision intact, PERRLA, conjunctivae clear secretions            Ears- Hearing, canals-normal            Nose- , no-Septal dev, mucus, polyps, erosion, perforation             Throat- Mallampati IVI , mucosa clear , drainage- none, tonsils- atrophic,  Neck- flexible , trachea midline, no stridor , thyroid nl, carotid no bruit Chest - symmetrical excursion , unlabored           Heart/CV- RRR rapid , no murmur , no gallop  , no rub, nl s1 s2                            - JVD none , edema- none, stasis changes- none, varices- none           Lung-   Clear, Wheeze- none, cough-none, dullness-none, rub- none. Rales- none           Chest wall-  Abd-  Br/ Gen/ Rectal- Not done, not indicated Extrem- cyanosis- none, clubbing, none, atrophy- none, strength- nl,    Neuro- grossly intact to observation

## 2019-08-31 ENCOUNTER — Encounter: Payer: Self-pay | Admitting: Internal Medicine

## 2019-08-31 DIAGNOSIS — J189 Pneumonia, unspecified organism: Secondary | ICD-10-CM | POA: Insufficient documentation

## 2019-08-31 NOTE — Assessment & Plan Note (Signed)
She reports diagnosis made today by her PCP office ( not in our EMR), and managing with levaquin and prednisone. Plan- cough syrup Rx as requested.

## 2019-08-31 NOTE — Assessment & Plan Note (Signed)
Baseline controlled, but currently being managed for pneumonia. Continue routine meds

## 2019-08-31 NOTE — Telephone Encounter (Signed)
Unable to close encounter. CY it states you have an incomplete note in the documentation.

## 2019-08-31 NOTE — Assessment & Plan Note (Signed)
She needs o continue O2 2L for sleep and exertion

## 2019-09-06 ENCOUNTER — Other Ambulatory Visit: Payer: Self-pay

## 2019-09-06 ENCOUNTER — Ambulatory Visit
Admission: RE | Admit: 2019-09-06 | Discharge: 2019-09-06 | Disposition: A | Discharge status: Home / Self Care | Payer: Medicare Other | Source: Ambulatory Visit | Attending: Oncology | Admitting: Oncology

## 2019-09-06 DIAGNOSIS — Z1231 Encounter for screening mammogram for malignant neoplasm of breast: Secondary | ICD-10-CM

## 2019-09-17 IMAGING — MG DIGITAL SCREENING BILATERAL MAMMOGRAM WITH TOMO AND CAD
8 series · 8 of 24 positions shown · non-contrast
Comparison: Previous exam(s).

CLINICAL DATA: Screening.

EXAM:
DIGITAL SCREENING BILATERAL MAMMOGRAM WITH TOMO AND CAD

[R CC synth-2D]
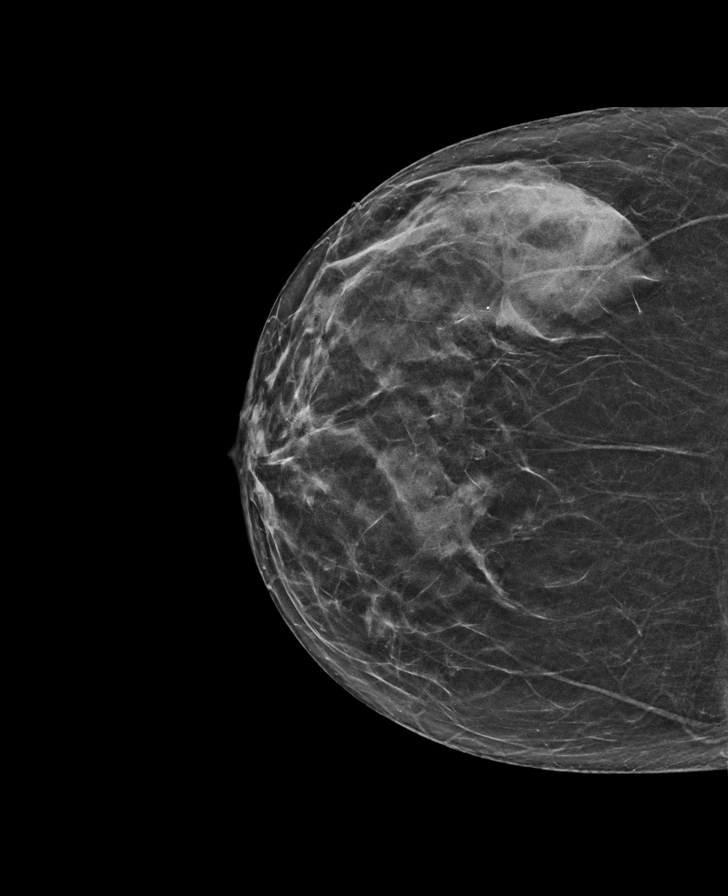

[L MLO synth-2D]
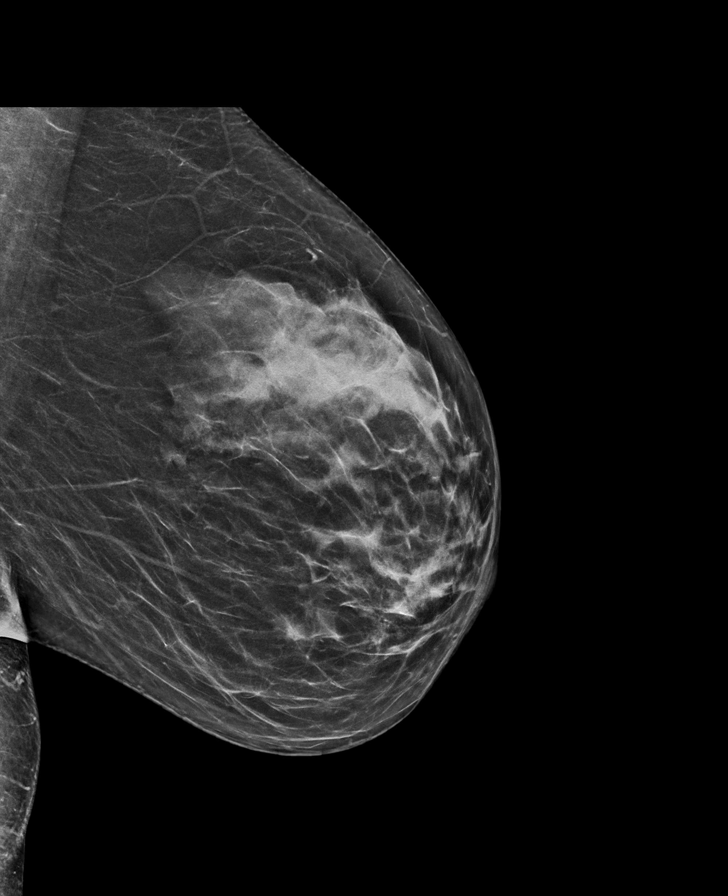

[L CC synth-2D]
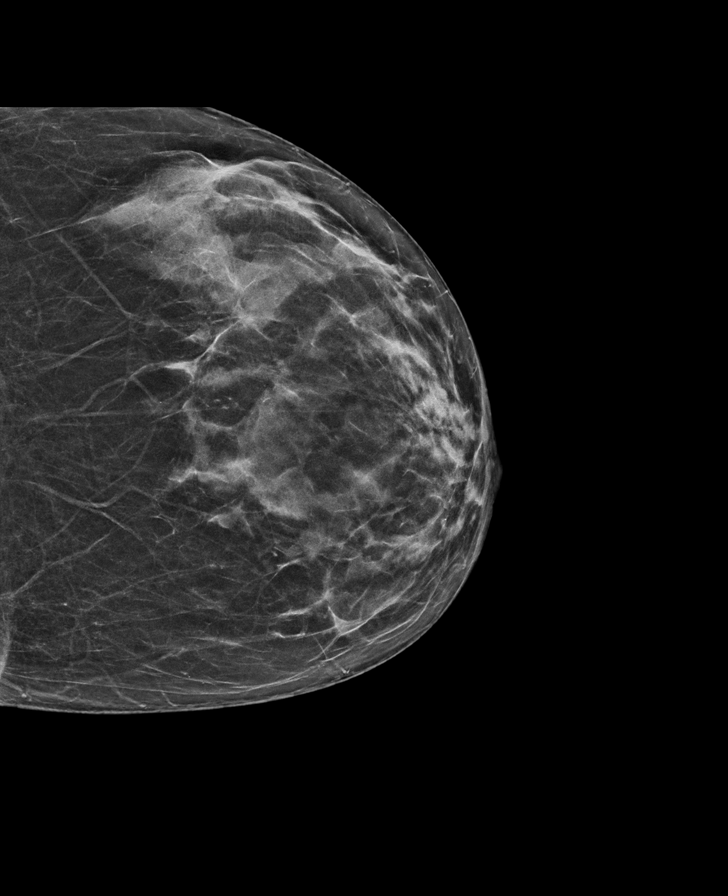

[R MLO synth-2D]
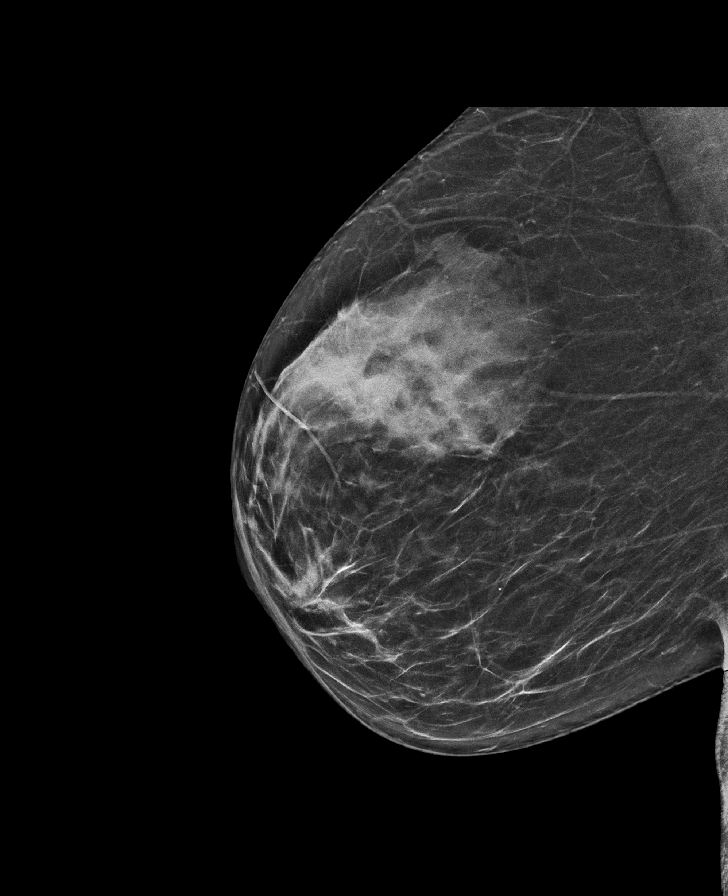

[R MLO tomo · tomo slice 33/66.0]
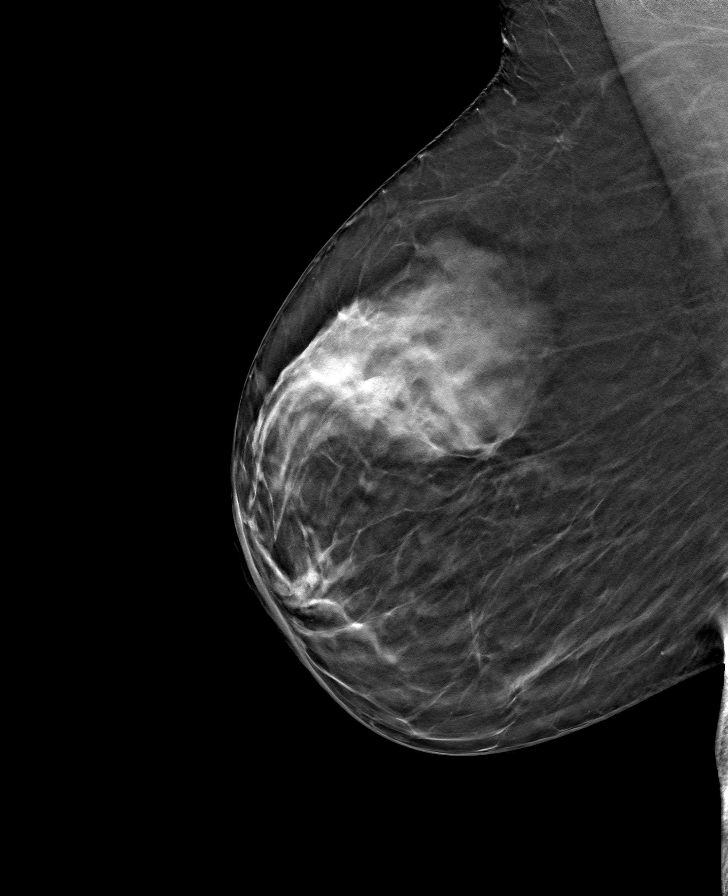

[R CC tomo · tomo slice 30/59.0]
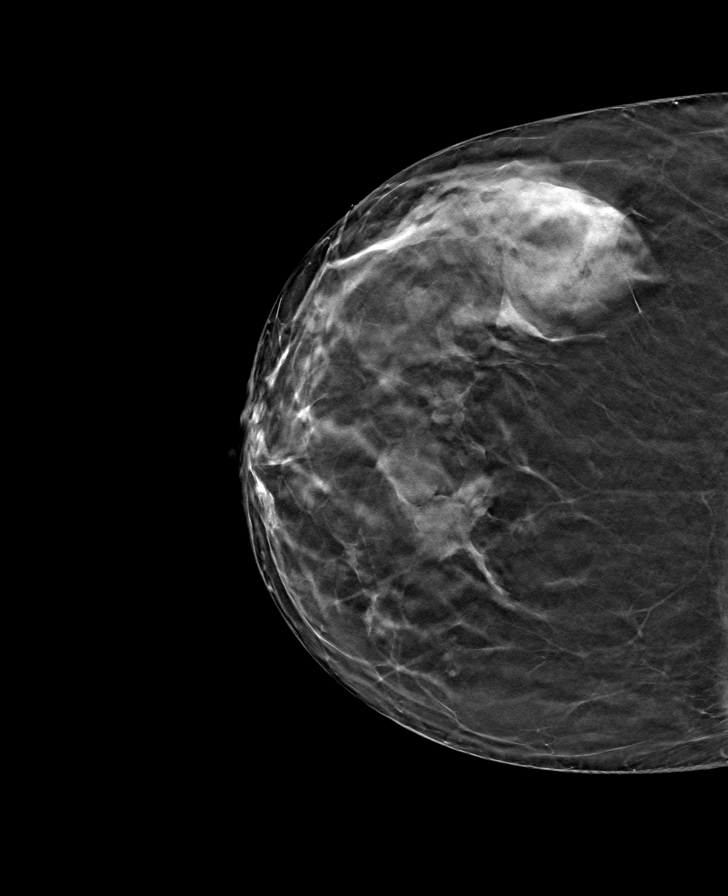

[L CC tomo · tomo slice 31/60.0]
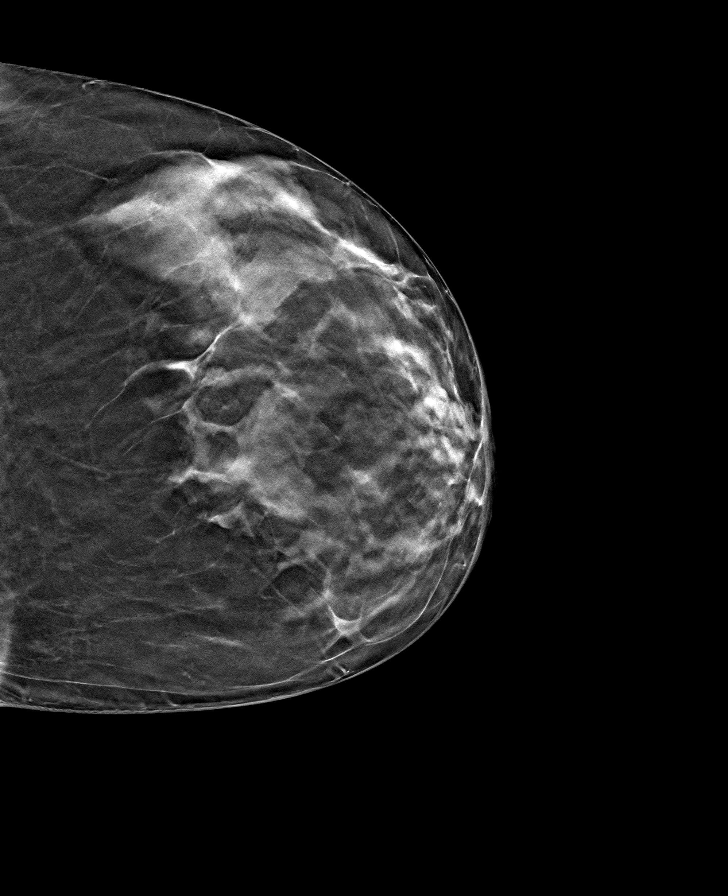

[L MLO tomo · tomo slice 33/65.0]
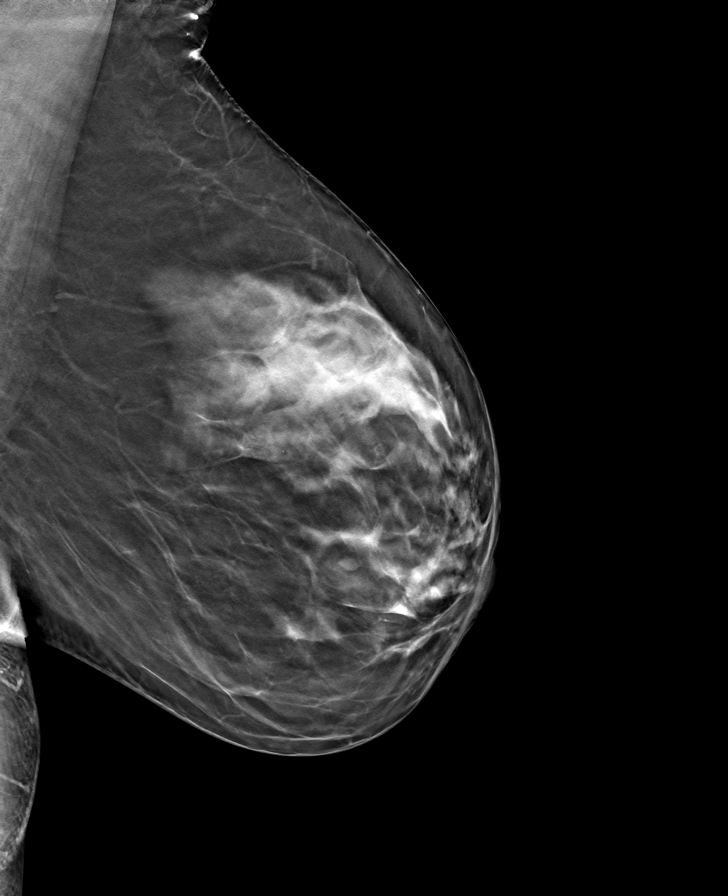

[8 of 24 positions shown; findings below may reference images not displayed]

ACR Breast Density Category c: The breast tissue is heterogeneously
dense, which may obscure small masses.
FINDINGS: There are no findings suspicious for malignancy. Images were
processed with CAD.
IMPRESSION: No mammographic evidence of malignancy. A result letter of this
screening mammogram will be mailed directly to the patient.

RECOMMENDATION:
Screening mammogram in one year. (Code:[id number])

BI-RADS CATEGORY  1: Negative.

## 2019-09-20 ENCOUNTER — Other Ambulatory Visit (HOSPITAL_COMMUNITY): Payer: Self-pay

## 2019-09-20 NOTE — Telephone Encounter (Signed)
OK to refill

## 2019-09-23 NOTE — Telephone Encounter (Signed)
Spoke with patient and she does not need refill, this medication has been filled

## 2019-11-22 ENCOUNTER — Encounter: Payer: Self-pay | Admitting: Gastroenterology

## 2019-12-13 ENCOUNTER — Encounter: Payer: Self-pay | Admitting: Internal Medicine

## 2019-12-13 ENCOUNTER — Ambulatory Visit (INDEPENDENT_AMBULATORY_CARE_PROVIDER_SITE_OTHER): Payer: Medicare Other | Admitting: Internal Medicine

## 2019-12-13 ENCOUNTER — Other Ambulatory Visit: Payer: Self-pay

## 2019-12-13 VITALS — BP 104/68 | HR 89 | Temp 97.6°F | Ht 65.0 in | Wt 176.0 lb

## 2019-12-13 DIAGNOSIS — J449 Chronic obstructive pulmonary disease, unspecified: Secondary | ICD-10-CM

## 2019-12-13 DIAGNOSIS — J9611 Chronic respiratory failure with hypoxia: Secondary | ICD-10-CM | POA: Diagnosis not present

## 2019-12-13 MED ORDER — BREO ELLIPTA 100-25 MCG/INH IN AEPB: INHALATION_SPRAY | RESPIRATORY_TRACT | 4 refills | Status: DC

## 2019-12-13 NOTE — Progress Notes (Signed)
Patient ID: [name], female    DOB: 04-21-1966, 54 y.o.   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 Walk test on room air 06/28/2016-95%, 95%, 95%, 93%, peak heart rate 120/minute. No desaturation after 3185 feet. Office Spirometry 06/28/2016-limited validity due to cough. Restriction of exhaled volume. FVC 1.79/50%, FEV1 1.64/57%, ratio 0.80. FENO- 5 Walk Test on room Air- Qualified for home O2 04/10/17 PFT 06/17/17- severe obstruction, mild restriction, diffusion severely reduced. Insignificant response to bronchodilator. FVC 1.73/47%, FEV1 1.10/38%, ratio 0.64, TLC 77%, DLCO 32% CHF- acute hosp 05/2017- CHF, CAD, EF 20-25%. Stent. Added Plavix, spironolactone, Coreg, Cozaar Walk test 02/10/2019- desat to 88%, on 2L o2 was 94% Echocardiogram 09/16/18- DD, EF 65-70%  ------------------------------------------------------------------------------------------------------------  08/30/2019- Virtual Visit via Telephone Note  I connected with [name] on 08/30/19 at  4:30 PM EST by telephone and verified that I am speaking with the correct person using two identifiers.  Location: Patient: [name]   I discussed the limitations, risks, security and privacy concerns of performing an evaluation and management service by telephone and the availability of in person appointments. I also discussed with the patient that there may be a patient responsible charge related to this service. The patient expressed  understanding and agreed to proceed.   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  Now asks a codeine cough syrup.   Observations/Objective:   Assessment and Plan: Chronic resp failure w hypoxia- needs to continue O2 CAP- cough syrup Rx'd, to continue levaquin and pred per pcp  Follow Up Instructions:    I discussed the assessment and treatment plan with the patient. The patient was provided an opportunity to ask questions and all were answered. The patient agreed with the plan and demonstrated an understanding of the instructions.   The patient was advised to call back or seek an in-person evaluation if the symptoms worsen or if the condition fails to improve as anticipated.  I provided 15 minutes of non-face-to-face time during this encounter.   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 O2 1-2L/MIN Has had 1 Covax Used rescue yest. Has POC on 1L today. Using O2 at 1-2L. Now of pro=ednisone.  Pending f/u colonoscopy. Needs Breo refilled, but has felt stable without recent exacerbation. DOE with little cough or wheeze most dys. No  blood.  Review of Systems-+ = positive Constitutional:   No-   weight loss, night sweats, fevers, chills, fatigue, lassitude. HEENT:   No-  headaches, difficulty swallowing, tooth/dental problems, sore throat,      mild sneezing,no- itching, ear ache, nasal congestion, post nasal drip,  CV:  No-   chest pain, orthopnea, PND, swelling in lower extremities, anasarca, dizziness, palpitations Resp: + shortness of breath with exertion or at rest.              +productive cough, + non-productive cough,  No- coughing up of blood.               change in color of mucus. + wheezing.   Skin: No-   rash or lesions. GI:  No-   heartburn, indigestion, abdominal pain, nausea, vomiting,  GU:   MS:  No-   joint pain or swelling.   Neuro-     nothing unusual Psych:  No- change in mood or affect. No depression or anxiety.  No memory loss.   Objective:   Physical Exam    Has POC General- Alert, Oriented, Affect-appropriate, Distress - NAD, + overweight   Skin- rash-none, lesions- none, excoriation- none.  Lymphadenopathy- none Head- atraumatic            Eyes- Gross vision intact, PERRLA, conjunctivae clear secretions            Ears- Hearing, canals-normal            Nose- , no-Septal dev, mucus, polyps, erosion, perforation             Throat- Mallampati IVI , mucosa clear , drainage- none, tonsils- atrophic,  Neck- flexible , trachea midline, no stridor , thyroid nl, carotid no bruit Chest - symmetrical excursion , unlabored           Heart/CV- RRR rapid , no murmur , no gallop  , no rub, nl s1 s2                           - JVD none , edema- none, stasis changes- none, varices- none           Lung-   Clear, Wheeze- none, cough-none, dullness-none, rub- none. Rales- none           Chest wall-  Abd-  Br/ Gen/ Rectal- Not done, not indicated Extrem- cyanosis-  none, clubbing, none, atrophy- none, strength- nl,    Neuro- grossly intact to observation

## 2019-12-13 NOTE — Patient Instructions (Signed)
Order- schedule future CXR to be done in 4 months at next ov    Dx COPD mixed type, hx lung Ca  Breo refill e-sent  Please call as needed

## 2019-12-20 ENCOUNTER — Ambulatory Visit (INDEPENDENT_AMBULATORY_CARE_PROVIDER_SITE_OTHER): Payer: Medicare Other | Admitting: Cardiovascular Disease

## 2019-12-20 ENCOUNTER — Encounter: Payer: Self-pay | Admitting: *Deleted

## 2019-12-20 ENCOUNTER — Encounter: Payer: Self-pay | Admitting: Cardiovascular Disease

## 2019-12-20 ENCOUNTER — Other Ambulatory Visit: Payer: Self-pay

## 2019-12-20 VITALS — BP 110/62 | HR 87 | Ht 65.0 in | Wt 174.0 lb

## 2019-12-20 DIAGNOSIS — Z5181 Encounter for therapeutic drug level monitoring: Secondary | ICD-10-CM

## 2019-12-20 DIAGNOSIS — E7800 Pure hypercholesterolemia, unspecified: Secondary | ICD-10-CM

## 2019-12-20 DIAGNOSIS — I5032 Chronic diastolic (congestive) heart failure: Secondary | ICD-10-CM

## 2019-12-20 DIAGNOSIS — J9611 Chronic respiratory failure with hypoxia: Secondary | ICD-10-CM

## 2019-12-20 DIAGNOSIS — I2510 Atherosclerotic heart disease of native coronary artery without angina pectoris: Secondary | ICD-10-CM | POA: Diagnosis not present

## 2019-12-20 DIAGNOSIS — C3491 Malignant neoplasm of unspecified part of right bronchus or lung: Secondary | ICD-10-CM

## 2019-12-20 DIAGNOSIS — I9589 Other hypotension: Secondary | ICD-10-CM

## 2019-12-20 LAB — LIPID PANEL
Chol/HDL Ratio: 3.6 ratio (ref 0.0–4.4)
Cholesterol, Total: 122 mg/dL (ref 100–199)
HDL: 34 mg/dL — ABNORMAL LOW (ref >39)
LDL Chol Calc (NIH): 65 mg/dL (ref 0–99)
Triglycerides: 129 mg/dL (ref 0–149)
VLDL Cholesterol Cal: 23 mg/dL (ref 5–40)

## 2019-12-20 LAB — COMPREHENSIVE METABOLIC PANEL
ALT: 8 IU/L (ref 0–32)
AST: 14 IU/L (ref 0–40)
Albumin/Globulin Ratio: 1.8 (ref 1.2–2.2)
Albumin: 4.7 g/dL (ref 3.8–4.9)
Alkaline Phosphatase: 133 IU/L — ABNORMAL HIGH (ref 39–117)
BUN/Creatinine Ratio: 16 (ref 9–23)
BUN: 12 mg/dL (ref 6–24)
Bilirubin Total: 0.8 mg/dL (ref 0.0–1.2)
CO2: 22 mmol/L (ref 20–29)
Calcium: 9.5 mg/dL (ref 8.7–10.2)
Chloride: 103 mmol/L (ref 96–106)
Creatinine, Ser: 0.74 mg/dL (ref 0.57–1.00)
GFR calc Af Amer: 105 mL/min/{1.73_m2} (ref >59)
GFR calc non Af Amer: 91 mL/min/{1.73_m2} (ref >59)
Globulin, Total: 2.6 g/dL (ref 1.5–4.5)
Glucose: 82 mg/dL (ref 65–99)
Potassium: 4.3 mmol/L (ref 3.5–5.2)
Sodium: 141 mmol/L (ref 134–144)
Total Protein: 7.3 g/dL (ref 6.0–8.5)

## 2019-12-20 NOTE — Progress Notes (Signed)
Cardiology Office Note   Date:  12/22/2019   ID:  [name], DOB Oct 03, 1965, MRN [id number]  PCP:  Lori Battles, MD  Cardiologist:   Lori Latch, MD   No chief complaint on file.    History of Present Illness: [name] is a 54 y.o. female with chronic systolic and diastolic heart failure (LVEF improved from 20 to 25% to 50%), CAD status post PCI, severe COPD, lung cancer in remission (lobectomy, XRT, and chemo), colon cancer status post resection, and melanoma here for follow up.  She was previously a patient of Dr. Marigene [name].  She presented 04/2017 with acute systolic heart failure (LVEF was 20 to 25% with inferior/inferoseptal akinesis.  Her RV was mildly dilated and had decreased systolic function.  Left heart cath revealed 70% ostial left main disease.  She had a drug-eluting stent placed in the ostial LAD left main.  Subsequent echo 08/2017 revealed an improvement in her function to 50 to 55%.    She had moderate aortic regurgitation at that time.  She last followed up in the heart failure clinic 09/2018 and was doing well.  [name] reports chronic shortness of breath that has been ongoing since her lung cancer.  She thinks it is been a little bit worse lately.  She tries not to use her oxygen during the day.  She does use it regularly at night.  She notes that she snores loudly and had a sleep study in the past that showed oxygen desaturations.  She has no chest pain.  She has a mild swelling in her right ankle that is chronic.  She has no orthopnea or PND.  Since her last appointment [name] has been generally well.  She continues to struggle with shortness of breath with exertion.  She is unable to move quickly.  She does not get much exercise because of this.  She also struggles with breathing with the mask and using her oxygen.  Therefore she does not use her portable oxygen unless she is going to be away for a long period of time.  She has not had any  chest pain.  She has no edema, orthopnea, or PND.  Her main complaint today is that she has a vaginal ulcer that she attributes to having the Covid vaccine.  She is in the process of making an appointment to see her PCP.  She has been mostly eating at home but does eat out some as well.  She tries to limit her salt intake and notes that her feet swell if she does have salty meals.  Her PCP added Zetia in December.  She seems to be tolerating it well.   Past Medical History:  Diagnosis Date  . Acute bilateral low back pain without sciatica   . Acute congestive heart failure ([place]) 05/02/2017   EF 20-25% on ECHO  . Acute respiratory failure with hypoxia ([place]) 05/01/2017  . 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 Allergic rhinitis   . Allergy   . Anxiety disorder   . Aortic valve regurgitation 05/15/2017  . Asthma   . Chronic bronchitis   . Chronic diastolic heart failure ([place]) 12/22/2019  . Colon cancer ([place])    adenocarcinoma in a polyps 11-24-2014  . COPD (chronic obstructive pulmonary disease) ([place])   . Cough   . Dizziness and giddiness   . Dyspepsia   . Dyspnea 04/30/2017  . Emphysema of lung ([place])   . External hemorrhoids   . EXTERNAL  HEMORRHOIDS 09/27/2009  . Female stress incontinence   . Genetic testing 08/18/2016   Negative for known pathogenic mutations within any of 25 genes on a Custom Panel through [employer].  One variant of uncertain significance (VUS) called "c.7187C>G (p.Thr2396Ser)" was found in one copy of the ATM gene.  This Custom Cancer Panel offered by [employer] includes sequencing and/or duplication/deletion testing of the following 25 genes: APC, ATM, AXIN2, BAP1, BMPR1A, BRCA1, BRCA2, CDH1, CDK4, CDKN2A, CHEK2, EPCAM, MITF, MLH1, MSH2, MSH6, MUTYH, PMS2, POLD1, POLE, PTEN, SCG5/GREM1, SMAD4, STK11, and TP53. Date of report is July 23, 2016.  MSH2 Exons 1-7 Inversion Analysis was also negative through [employer].  Date of report is July 23, 2016.     Marland Kitchen GERD  (gastroesophageal reflux disease)    occ depending on diet   . Goiter   . History of lung cancer   . Hypercholesteremia    denies-last check normal labs  . Hypokalemia   . 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 Insomnia   . Lung cancer ([employer])   . Migraine, unspecified, without mention of intractable migraine without mention of status migrainosus   . Panic attacks   . Seizures ([place])    very long ago like 15 years ago or so , questionable etilogy  . Skin cancer (melanoma) ([place])    right calf  . Snoring 12/11/2010   Longstanding hx of snore. NPSG was normal years ago.    Marland Kitchen Swelling of right lower extremity 12/01/2014    Past Surgical History:  Procedure Laterality Date  . ABDOMINAL HYSTERECTOMY  2001   no BSO  . biopsy of right neck mass     negative-thyroid biopsy   . BREAST EXCISIONAL BIOPSY Right 1987   no visible scar  . BRONCHOSCOPY  01/31/2005   nonspecific inflammation  . BRONCHOSCOPY  08/24/2010   nonspecific inflammation  . COLONOSCOPY    . CORONARY STENT INTERVENTION N/A 05/12/2017   Procedure: CORONARY STENT INTERVENTION;  Surgeon: Lori Forward, MD;  Location: [facility];  Service: Cardiovascular;  Laterality: N/A;  . LUNG REMOVAL, PARTIAL     for lung cancer--followed by Dr. Arlyce [name] and Dr. Annamaria [name]  . MELANOMA EXCISION WITH SENTINEL LYMPH NODE BIOPSY Right 11/29/2014   Procedure: RIGHT INGUINAL SENTINEL LYMPH NODE BIOPSY;  Surgeon: Lori Skeans, MD;  Location: [facility];  Service: General;  Laterality: Right;  . melanoma removal     right calf   . POLYPECTOMY    . portacath removed  10/2007  . RIGHT/LEFT HEART CATH AND CORONARY ANGIOGRAPHY N/A 05/06/2017   Procedure: RIGHT/LEFT HEART CATH AND CORONARY ANGIOGRAPHY;  Surgeon: Lori Dials, MD;  Location: [facility];  Service: Cardiovascular;  Laterality: N/A;  . SPIROMETRY  07/03/2002   min obstruction,mild restriction 01/31/2005  . tvh  12/01/2000   hysterectomy     Current  Outpatient Medications  Medication Sig Dispense Refill  . albuterol (VENTOLIN HFA) 108 (90 Base) MCG/ACT inhaler INHALE 2 PUFFS INTO THE LUNGS EVERY 6 HOURS AS NEEDED FOR WHEEZING AND SHORTNESS OF BREATH 25.5 g 6  . ALPRAZolam (XANAX) 0.5 MG tablet Take 1 tablet (0.5 mg total) by mouth 2 (two) times daily as needed for up to 1 dose for anxiety. 40 tablet 0  . atorvastatin (LIPITOR) 40 MG tablet Take 1 tablet (40 mg total) by mouth daily at 6 PM. 90 tablet 3  . carvedilol (COREG) 6.25 MG tablet Take 1 tablet (6.25 mg total) by mouth  2 (two) times daily with a meal. 180 tablet 3  . clopidogrel (PLAVIX) 75 MG tablet TAKE 1 TABLET(75 MG) BY MOUTH DAILY 90 tablet 3  . ezetimibe (ZETIA) 10 MG tablet Take 10 mg by mouth daily.    . fluticasone furoate-vilanterol (BREO ELLIPTA) 100-25 MCG/INH AEPB INHALE 1 PUFF BY MOUTH INTO THE LUNGS DAILY 240 each 4  . furosemide (LASIX) 20 MG tablet Take 1 tablet (20 mg total) by mouth daily. 90 tablet 3  . ipratropium-albuterol (DUONEB) 0.5-2.5 (3) MG/3ML SOLN USE ONE VIAL VIA NEBULIZER EVERY 6 HOURS AS NEEDED 90 mL 3  . levofloxacin (LEVAQUIN) 500 MG tablet Take 500 mg by mouth daily.    Marland Kitchen losartan (COZAAR) 25 MG tablet Take 1 tablet (25 mg total) by mouth daily. 90 tablet 3  . nitroGLYCERIN (NITROSTAT) 0.4 MG SL tablet Place 1 tablet (0.4 mg total) under the tongue every 5 (five) minutes as needed for chest pain. 30 tablet 12  . pantoprazole (PROTONIX) 40 MG tablet     . potassium chloride (KLOR-CON) 10 MEQ tablet Take 1 tablet (10 mEq total) by mouth daily. 90 tablet 4  . spironolactone (ALDACTONE) 25 MG tablet Take 1 tablet (25 mg total) by mouth daily. 90 tablet 3  . zolpidem (AMBIEN) 5 MG tablet TAKE 1 TABLET BY MOUTH AT BEDTIME AS NEEDED FOR SLEEP 30 tablet 5  . OXYGEN Inhale 1 L/min into the lungs as needed.    . valACYclovir (VALTREX) 1000 MG tablet Take 1,000 mg by mouth 2 (two) times daily.     No current facility-administered medications for this visit.     Allergies:   Patient has no known allergies.    Social History:  The patient  reports that she quit smoking about 18 years ago. Her smoking use included cigarettes. She started smoking about 41 years ago. She has a 46.00 pack-year smoking history. She has never used smokeless tobacco. She reports that she does not drink alcohol or use drugs.   Family History:  The patient's family history includes Asthma in her daughter; Breast cancer in her mother; COPD in her maternal grandmother and mother; Cancer in her paternal grandfather; Cirrhosis in her paternal uncle; Colon polyps in her brother; Diabetes in her maternal grandfather; Lung cancer in her father; Other in her maternal aunt and mother; Throat cancer in an other family member.    ROS:  Please see the history of present illness.   Otherwise, review of systems are positive for none.   All other systems are reviewed and negative.    PHYSICAL EXAM: VS:  BP 110/62   Pulse 87   Ht '5\' 5"'  (1.651 m)   Wt 174 lb (78.9 kg)   SpO2 90%   BMI 28.96 kg/m  , BMI Body mass index is 28.96 kg/m. GENERAL:  Well appearing HEENT: Pupils equal round and reactive, fundi not visualized, oral mucosa unremarkable NECK:  No jugular venous distention, waveform within normal limits, carotid upstroke brisk and symmetric, no bruits LUNGS:  Clear to auscultation bilaterally HEART:  RRR.  PMI not displaced or sustained,S1 and S2 within normal limits, no S3, no S4, no clicks, no rubs, no murmurs ABD:  Flat, positive bowel sounds normal in frequency in pitch, no bruits, no rebound, no guarding, no midline pulsatile mass, no hepatomegaly, no splenomegaly EXT:  2 plus pulses throughout, no edema, no cyanosis no clubbing SKIN:  No rashes no nodules NEURO:  Cranial nerves II through XII grossly intact, motor grossly  intact throughout [facility]:  Cognitively intact, oriented to person place and time   EKG:  EKG is ordered today. The ekg ordered today demonstrates  sinus rhythm rate 83 bpm.  First degree AV block.  Nonspecific ST changes. QTc 470 ms. 12/20/19: Sinus rhythm.  Rate 87 bpm.  First degree AV block. QTc 510 ms.   Echo 09/16/2018: Study Conclusions  - Left ventricle: The cavity size was normal. Wall thickness was   normal. Systolic function was vigorous. The estimated ejection   fraction was in the range of 65% to 70%. Doppler parameters are   consistent with abnormal left ventricular relaxation (grade 1   diastolic dysfunction).  LHC 05/06/17: 60% ostial LM.  15% proximal RCA.  Recent Labs: 06/24/2019: Hemoglobin 12.1; Platelet Count 215 12/20/2019: ALT 8; BUN 12; Creatinine, Ser 0.74; Potassium 4.3; Sodium 141    Lipid Panel    Component Value Date/Time   CHOL 122 12/20/2019 1111   TRIG 129 12/20/2019 1111   HDL 34 (L) 12/20/2019 1111   CHOLHDL 3.6 12/20/2019 1111   CHOLHDL 3.5 07/28/2018 1427   VLDL 19 07/28/2018 1427   LDLCALC 65 12/20/2019 1111   LDLDIRECT 137 (H) 09/27/2009 2057      Wt Readings from Last 3 Encounters:  12/22/19 175 lb 8 oz (79.6 kg)  12/20/19 174 lb (78.9 kg)  12/13/19 176 lb (79.8 kg)      ASSESSMENT AND PLAN:  # Chronic systolic and diastolic heart failure: LVEF recovered to 60-65%.  She remains euvolemic on exam.  Continue carvedilol, losartan, and spironolactone.  # CAD s/p LAD PCI: She is doing well and has no evidence of ischemia.  Continue lifelong clopidogrel.  Continue carvedilol and atorvastatin.  LDL was 81 on 08/2019.  Zetia was added to her regimen by her PCP.  We will check lipids and a CMP.  LDL goal is less than 70.  She is okay to hold clopidogrel for 3 to 5 days prior to colonoscopy at the discretion of her gastroenterologist.  # Severe COPD: # Prior lung cancer: Continue supplemental oxygen overnight.  She reports that her daytime oxygen levels never dropped below 90.  She continues to abstain from smoking.   Current medicines are reviewed at length with the patient today.   The patient does not have concerns regarding medicines.  The following changes have been made:  no change  Labs/ tests ordered today include:   Orders Placed This Encounter  Procedures  . Lipid panel  . Comprehensive metabolic panel  . EKG 12-Lead     Disposition:   FU with Lori Claggett C. Oval Linsey, MD, [facility] in 1 year    Signed, Lori Cassel C. Oval Linsey, MD, [facility]  12/22/2019 12:23 PM    [employer]

## 2019-12-20 NOTE — Patient Instructions (Signed)
Medication Instructions:  Your physician recommends that you continue on your current medications as directed. Please refer to the Current Medication list given to you today.  *If you need a refill on your cardiac medications before your next appointment, please call your pharmacy*  Lab Work: LP/CMET   If you have labs (blood work) drawn today and your tests are completely normal, you will receive your results only by: Marland Kitchen MyChart Message (if you have MyChart) OR . A paper copy in the mail If you have any lab test that is abnormal or we need to change your treatment, we will call you to review the results.  Testing/Procedures: NONE  Follow-Up: At [facility], you and your health needs are our priority.  As part of our continuing mission to provide you with exceptional heart care, we have created designated Provider Care Teams.  These Care Teams include your primary Cardiologist (physician) and Advanced Practice Providers (APPs -  Physician Assistants and Nurse Practitioners) who all work together to provide you with the care you need, when you need it.  We recommend signing up for the patient portal called "MyChart".  Sign up information is provided on this After Visit Summary.  MyChart is used to connect with patients for Virtual Visits (Telemedicine).  Patients are able to view lab/test results, encounter notes, upcoming appointments, etc.  Non-urgent messages can be sent to your provider as well.   To learn more about what you can do with MyChart, go to [email or web address].    Your next appointment:   12 month(s) You will receive a reminder letter in the mail two months in advance. If you don't receive a letter, please call our office to schedule the follow-up appointment.   The format for your next appointment:   Either In Person or Virtual  Provider:   You may see DR [facility]  or one of the following Advanced Practice Providers on your designated Care Team:    Kerin Ransom,  PA-C  [place], PA-C  Coletta Memos, [place]   OK TO HOLD YOUR PLAVIX (CLOPIDOGREL) 3 DAYS PRIOR TO ENDOSCOPY

## 2019-12-22 ENCOUNTER — Ambulatory Visit (INDEPENDENT_AMBULATORY_CARE_PROVIDER_SITE_OTHER): Payer: Medicare Other | Admitting: Nurse Practitioner

## 2019-12-22 ENCOUNTER — Encounter: Payer: Self-pay | Admitting: Cardiovascular Disease

## 2019-12-22 ENCOUNTER — Telehealth: Payer: Self-pay | Admitting: General Surgery

## 2019-12-22 ENCOUNTER — Encounter: Payer: Self-pay | Admitting: Nurse Practitioner

## 2019-12-22 VITALS — BP 90/62 | HR 88 | Temp 98.5°F | Ht 64.75 in | Wt 175.5 lb

## 2019-12-22 DIAGNOSIS — Z8709 Personal history of other diseases of the respiratory system: Secondary | ICD-10-CM

## 2019-12-22 DIAGNOSIS — Z85038 Personal history of other malignant neoplasm of large intestine: Secondary | ICD-10-CM | POA: Diagnosis not present

## 2019-12-22 DIAGNOSIS — I5032 Chronic diastolic (congestive) heart failure: Secondary | ICD-10-CM

## 2019-12-22 DIAGNOSIS — Z85118 Personal history of other malignant neoplasm of bronchus and lung: Secondary | ICD-10-CM | POA: Diagnosis not present

## 2019-12-22 HISTORY — DX: Chronic diastolic (congestive) heart failure: I50.32

## 2019-12-22 NOTE — Progress Notes (Signed)
12/22/2019 [name] [id number] [birth year]   CHIEF COMPLAINT: Schedule a colonoscopy   HISTORY OF PRESENT ILLNESS:  Lori Lanahan. 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 Olevia Perches at the time of a colonoscopy on 11/24/2014.  Her most recent colonoscopy by Dr. Havery Moros was 01/10/2017 which identified internal hemorrhoids with scarring noted from prior banding, no polyps.  A repeat colonoscopy 3 years was recommended.  She presents today to schedule a surveillance colonoscopy.  She denies having any upper or lower abdominal pain.  She is passing a normal formed brown bowel movement once daily.  She has a history of hemorrhoids which were banded by Dr. Havery Moros x 3 in 2017 and x 1 in 2018. She has occasional rectal bleeding.  She describes seeing a small to moderate amount of bright red blood on the toilet tissue, stool and in the toilet water which occurs once or twice monthly.  No anal or rectal pain.  She has a history of GERD which is well controlled on pantoprazole 40 mg daily.  She denies having any dysphagia, heartburn or stomach pain.  She has a history of COPD with chronic SOB and lung cancer as reported above.  She uses oxygen 1 L nasal cannula as needed when shortness short of breath which occurs if she has increased activity which occurs approximately 2 days weekly.  She sometimes uses her oxygen at nighttime as well.  She stated she typically uses her oxygen when away from the home but she did not wear her  oxygen today as she did not think she would be away from home for too long.  She denies having any current shortness of breath.  She has a chronic intermittent cough.  No hemoptysis.  She received her first Covid vaccination and 4 days later developed a herpes like a blister to the vaginal area which was treated by her PCP.  She is concerned the Covid vaccination trigger this is she is never had herpes in the past.  She is scheduled to receive her second Covid vaccination on 5/6.  No other complaints today.  Laboratory studies 08/06/2019: WBC 6.23.  Hemoglobin 13.1.  Hematocrit 40.7.  MCV 88.9.  Platelet 202.  Sodium 139.  Potassium 4.0.  BUN 15.  Creatinine 0.8.  Pulse 93.  AST 11.  ALT 10.   Alk phos 93.  Colonoscopy 01/10/2017: - Internal hemorrhoids with scarring noted from prior banding - The examination was otherwise normal. No polyps - Recall colonoscopy 3 years.   Colonoscopy 11/29/2015:  - One 4 mm polyp in the ascending colon, removed with a cold snare. Resected and retrieved. - One diminutive polyp in the ascending colon, removed with a cold biopsy forceps. - Post-polypectomy scar in the ascending colon. Biopsied. - One 6 mm polyp in the transverse colon, removed with a hot snare. - One 4 mm polyp in the transverse colon, removed with a cold snare.  - One 4 mm polyp in the sigmoid colon, removed with a cold snare.  - A single non-bleeding colonic  angiodysplastic lesion.  -Non-bleeding internal hemorrhoids. - Recall colonoscopy in 1 year.  Colonoscopy 12/30/2014 to tattoo the cancerous polyp area:  5 mm residual polyp in the ascending colon removed tattoo and Endo Clip placed at 80 cm from the rectum, most likely represents the residual polypectomy site 2 additional benign-appearing polyps in descending colon at 40 and 50 cm removed 1. Surgical [P], ascending at 80 cm - FRAGMENTS OF TUBULAR ADENOMA (X1); NEGATIVE FOR HIGH GRADE DYSPLASIA OR MALIGNANCY. 2. Surgical [P], descending -  TUBULAR ADENOMAS (X2); NEGATIVE FOR HIGH GRADE DYSPLASIA OR MALIGNANCY  Colonoscopy 11/24/2014 by Dr. Olevia Perches: 1. Six semi-pedunculated polyps ranging from 5 -14 mm were found in the descending colon and ascending colon; polypectomy was performed using snare cautery;x4 polypectomy was performed with cold forceps;x1 polypectomy was performed with a cold snare x1 2. Moderate sized internal and external Grade I hemorrhoids   ECHO 09/16/2018: LV EF: 65% -  70%    Past Medical History:  Diagnosis Date   Acute bilateral low back pain without sciatica    Acute congestive heart failure ([place]) 05/02/2017   EF 20-25% on ECHO   Acute respiratory failure with hypoxia (HCC) 05/01/2017   Adhesive capsulitis 05/22/2011   Allergic rhinitis    Allergy    Anxiety disorder    Aortic valve regurgitation 05/15/2017   Asthma    Chronic bronchitis    Colon cancer ([place])    adenocarcinoma in a polyps 11-24-2014   COPD (chronic obstructive pulmonary disease) (HCC)    Cough    Dizziness and giddiness    Dyspepsia    Dyspnea 04/30/2017   Emphysema of lung [facility])    External hemorrhoids    EXTERNAL HEMORRHOIDS 09/27/2009   Female stress incontinence    Genetic testing 08/18/2016   Negative for known pathogenic mutations within any of 25 genes on a Custom Panel through [employer].  One variant of uncertain significance (VUS) called "c.7187C>G (p.Thr2396Ser)" was found in one copy of the ATM gene.  This Custom Cancer Panel offered by [employer] includes sequencing and/or duplication/deletion testing of the following 25 genes: APC, ATM, AXIN2, BAP1, BMPR1A, BRCA1, BRCA2, CDH1, CDK4, CDKN2A, CHEK2, EPCAM, MITF, MLH1, MSH2, MSH6, MUTYH, PMS2, POLD1, POLE, PTEN, SCG5/GREM1, SMAD4, STK11, and TP53. Date of report is July 23, 2016.  MSH2 Exons 1-7 Inversion Analysis was also negative through [employer].  Date of report is July 23, 2016.      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 NPSG was normal years ago.     Swelling of right lower extremity 12/01/2014   Past Surgical History:  Procedure Laterality Date   ABDOMINAL HYSTERECTOMY  2001   no BSO   biopsy of right neck mass     negative-thyroid biopsy    BREAST EXCISIONAL BIOPSY Right 1987   no visible scar   BRONCHOSCOPY  01/31/2005   nonspecific inflammation   BRONCHOSCOPY  08/24/2010   nonspecific inflammation   COLONOSCOPY     CORONARY STENT INTERVENTION N/A 05/12/2017   Procedure: CORONARY STENT INTERVENTION;  Surgeon: Charolette Forward, MD;  Location: [place] [facility];  Service: Cardiovascular;  Laterality: N/A;   LUNG REMOVAL, PARTIAL     for lung cancer--followed by Dr. Arlyce Dice and Dr. Annamaria Boots   MELANOMA EXCISION WITH SENTINEL LYMPH NODE BIOPSY Right 11/29/2014   Procedure: RIGHT INGUINAL SENTINEL LYMPH NODE BIOPSY;  Surgeon: Georganna Skeans, MD;  Location: [place];  Service: General;  Laterality: Right;   melanoma removal     right calf    POLYPECTOMY     portacath removed  10/2007   RIGHT/LEFT HEART CATH AND CORONARY ANGIOGRAPHY N/A 05/06/2017   Procedure: RIGHT/LEFT HEART CATH AND CORONARY ANGIOGRAPHY;  Surgeon: [name], MD;  Location: [place] [facility];  Service: Cardiovascular;  Laterality: N/A;   SPIROMETRY  07/03/2002   min obstruction,mild restriction 01/31/2005   tvh  12/01/2000   hysterectomy    reports that she quit smoking about 18 years ago. Her  smoking use included cigarettes. She started smoking about 41 years ago. She has a 46.00 pack-year smoking history. She has never used smokeless tobacco. She reports that she does not drink alcohol or use drugs. family history includes Asthma in her daughter; Breast cancer in her mother; COPD in her maternal grandmother and mother; Cancer in her paternal grandfather; Cirrhosis in her paternal uncle; Colon polyps in her brother; Diabetes in her maternal grandfather; Lung cancer in her father; Other in her maternal aunt and mother; Throat cancer in an other family member. No Known Allergies    Outpatient Encounter Medications as of 12/22/2019  Medication Sig   albuterol (VENTOLIN HFA) 108 (90 Base) MCG/ACT inhaler INHALE 2 PUFFS INTO THE LUNGS EVERY 6 HOURS AS NEEDED FOR WHEEZING AND SHORTNESS OF BREATH   ALPRAZolam (XANAX) 0.5 MG tablet Take 1 tablet (0.5 mg total) by mouth 2 (two) times daily as needed for up to 1 dose for anxiety.   atorvastatin (LIPITOR) 40 MG tablet Take 1 tablet (40 mg total) by mouth daily at 6 PM.   carvedilol (COREG) 6.25 MG tablet Take 1 tablet (6.25 mg total) by mouth 2 (two) times daily with a meal.   clopidogrel (PLAVIX) 75 MG tablet TAKE 1 TABLET(75 MG) BY MOUTH DAILY   ezetimibe (ZETIA) 10 MG tablet Take 10 mg by mouth daily.   fluticasone furoate-vilanterol (BREO ELLIPTA) 100-25 MCG/INH AEPB INHALE 1 PUFF BY MOUTH INTO THE LUNGS DAILY   furosemide (LASIX) 20 MG tablet Take 1 tablet (20 mg total) by mouth daily.   ipratropium-albuterol (DUONEB) 0.5-2.5 (3) MG/3ML SOLN USE ONE VIAL VIA NEBULIZER EVERY 6 HOURS AS NEEDED   levofloxacin (LEVAQUIN) 500 MG tablet Take 500 mg by mouth daily.   losartan (COZAAR) 25 MG tablet Take 1 tablet (25 mg total) by mouth daily.   nitroGLYCERIN (NITROSTAT) 0.4 MG SL tablet Place 1 tablet (0.4 mg total) under the tongue every 5 (five) minutes as needed for chest pain.   pantoprazole (PROTONIX) 40 MG tablet    potassium  chloride (KLOR-CON) 10 MEQ tablet Take 1 tablet (10 mEq total) by mouth daily.   spironolactone (ALDACTONE) 25 MG tablet Take 1 tablet (25 mg total) by mouth daily.   valACYclovir (VALTREX) 1000 MG tablet Take 1,000 mg by mouth 2 (two) times daily.   zolpidem (AMBIEN) 5 MG tablet TAKE 1 TABLET BY  MOUTH AT BEDTIME AS NEEDED FOR SLEEP   No facility-administered encounter medications on file as of 12/22/2019.     REVIEW OF SYSTEMS: All other systems reviewed and negative except where noted in the History of Present Illness.   PHYSICAL EXAM: BP 90/62 (BP Location: Left Arm, Patient Position: Sitting, Cuff Size: Normal)    Pulse 88    Temp 98.5 F (36.9 C)    Ht 5' 4.75" (1.645 m) Comment: height measured without shoes   Wt 175 lb 8 oz (79.6 kg)    BMI 29.43 kg/m  General: Well developed  54 year old female in no acute distress. Head: Normocephalic and atraumatic. Eyes:  Sclerae non-icteric, conjunctive pink. Ears: Normal auditory acuity. Mouth: Dentition intact. No ulcers or lesions.  Neck: Supple, no lymphadenopathy or thyromegaly.  Lungs: Clear bilaterally to auscultation without wheezes, crackles or rhonchi. Heart: Regular rate and rhythm. No murmur, rub or gallop appreciated.  Abdomen: Soft, nontender, non distended. No masses. No hepatosplenomegaly. Normoactive bowel sounds x 4 quadrants.  Rectal: Deferred.  Musculoskeletal: Symmetrical with no gross deformities. Skin: Warm and dry. No rash or lesions on visible extremities. Extremities: No edema. Neurological: Alert oriented x 4, no focal deficits.  Psychological:  Alert and cooperative. Normal mood and affect.  ASSESSMENT AND PLAN:  5. 54 year old female with a history of adenocarcinoma colon polyps 11/24/2014 due for a surveillance colonoscopy.  -Colonoscopy benefits and risks discussed including risk with sedation, risk of bleeding, perforation and infection. Colonoscopy to be scheduled at  [facility] due to COPD with O2 1L [facility] use  as needed and when away from home.  -Further follow up to be determined after colonoscopy completed.  -Patient presented with a letter from her cardiologist Dr. Oval Linsey which verified ok to hold Plavix for 3 days prior to her colonoscopy.   2. History of GERD, asymptomatic on Pantoprazole 73m daily   3. History of COPD/chronic bronchitis on 1L  as needed at home, uses a few days weekly and at night time.   4. Significant cardiac history including CAD, stent and CHF  5. History of non-small cell lung cancer 2003        CC:  PLeanna Battles MD

## 2019-12-22 NOTE — Telephone Encounter (Signed)
No active Rx for xarelto on file nor mentioned during [place] with DR Oval Linsey on 12/20/2019. I don't see cardiac indication for Xarelto use either.

## 2019-12-22 NOTE — Telephone Encounter (Signed)
[facility] Pre-operative Risk Assessment     Request for surgical clearance:     Endoscopy Procedure at [facility]  What type of surgery is being performed?   Endoscopy/colonoscopy  When is this surgery scheduled?     02/07/2020  What type of clearance is required ?   Pharmacy  Are there any medications that need to be held prior to surgery and how long? Xarelto for 5 days  Practice name and name of physician performing surgery?      [facility]  What is your office phone and fax number?      Phone- [phone]  [phone]  Anesthesia type (None, local, MAC, general) ?       MAC

## 2019-12-22 NOTE — Telephone Encounter (Signed)
-----   Message from Noralyn Pick, NP sent at 12/22/2019  1:11 PM EDT ----- Olivia Mackie, please contact the patient's cardiologist to request holding Plavix for 5 days instead of 3 which is preferred by Dr. Havery Moros. Please let me know outcome.  ----- Message ----- From: Yetta Flock, MD Sent: 12/22/2019  12:45 PM EDT To: Noralyn Pick, NP    ----- Message ----- From: Noralyn Pick, NP Sent: 12/22/2019  12:44 PM EDT To: Yetta Flock, MD

## 2019-12-22 NOTE — Patient Instructions (Addendum)
If you are age 56 or older, your body mass index should be between 23-30. Your Body mass index is 29.43 kg/m. If this is out of the aforementioned range listed, please consider follow up with your Primary Care Provider.  If you are age 53 or younger, your body mass index should be between 19-25. Your Body mass index is 29.43 kg/m. If this is out of the aformentioned range listed, please consider follow up with your Primary Care Provider.   We have sent the following medications to your pharmacy for you to pick up at your convenience:  Suprep  Due to recent changes in healthcare laws, you may see the results of your imaging and laboratory studies on MyChart before your provider has had a chance to review them.  We understand that in some cases there may be results that are confusing or concerning to you. Not all laboratory results come back in the same time frame and the provider may be waiting for multiple results in order to interpret others.  Please give [place] 48 hours in order for your provider to thoroughly review all the results before contacting the office for clarification of your results.

## 2019-12-22 NOTE — Progress Notes (Signed)
Agree with assessment and plan with the following thoughts: Jaclyn Shaggy can you clarify, the patient needs to hold Plavix for 5 days, not 3 days. Thanks

## 2019-12-22 NOTE — Telephone Encounter (Addendum)
   Primary Cardiologist: Skeet Latch, MD  Chart reviewed as part of pre-operative protocol coverage. Given past medical history and time since last visit, based on ACC/AHA guidelines, [name] would be at acceptable risk for the planned procedure without further cardiovascular testing.   Not currently taking Xarelto, no need to hold the medication.  I will route this recommendation to the requesting party via Epic fax function and remove from pre-op pool.  Please call with questions.  Jossie Ng. [facility] [place] [address] Office [phone] Fax [phone]

## 2019-12-23 ENCOUNTER — Telehealth: Payer: Self-pay | Admitting: Nurse Practitioner

## 2019-12-23 MED ORDER — NA SULFATE-K SULFATE-MG SULF 17.5-3.13-1.6 GM/177ML PO SOLN: 1.0000 | Freq: Once | ORAL | 0 refills | Status: AC

## 2019-12-23 NOTE — Telephone Encounter (Signed)
Sent suprep to pharmacy. 

## 2020-01-09 NOTE — Assessment & Plan Note (Signed)
Remains dependent on O2. Discussed appropriate use. Plan- continue 1-2L 24/7

## 2020-01-09 NOTE — Assessment & Plan Note (Signed)
Memory Dance has been helpful Plan- refill Breo. Med discussion.

## 2020-02-01 ENCOUNTER — Telehealth: Payer: Self-pay | Admitting: Nurse Practitioner

## 2020-02-01 NOTE — Telephone Encounter (Signed)
[name], I have spoken with this really nice patient. She received a phone call saying she needed labs before her procedure. She confirms she will go for the COVID test on the scheduled date. Were there any labs you wanted drawn before her procedure?

## 2020-02-01 NOTE — Progress Notes (Signed)
Called pt for preprocedure phone call appt for colonoscopy on 02/07/20.  Patient stated she was supposed to have labs done on 02/03/20 at [facility] and knew nothing about COVID test prior to procedure.  Asked pt to call office of Dr Havery Moros for clarification of lab appt since there are no lab appts for pt in epic.  Gave pt my name and phone number to call back

## 2020-02-02 ENCOUNTER — Other Ambulatory Visit: Payer: Self-pay

## 2020-02-02 DIAGNOSIS — R748 Abnormal levels of other serum enzymes: Secondary | ICD-10-CM

## 2020-02-02 DIAGNOSIS — Z85038 Personal history of other malignant neoplasm of large intestine: Secondary | ICD-10-CM

## 2020-02-02 NOTE — Telephone Encounter (Signed)
Hi [name], I did not order any labs prior to her procedure, however,  in review, she had labs in April 2021 done by her cardiologist. Her alk phos was elevated therefore I would advise for her to have repeat labs to include alk phos isoenzyme, hepatic panel and GGT which can be done any time in the next few weeks, does not have to be done prior to her procedure.  Also, please verify with the patient she was to hold her Plavix for 5 days prior to her procedure not 3 days.  Dr. Havery Moros preferred her to hold Plavix for 5 days. There is a consult note from her cardiologist Dr. Lajuana Ripple 12/20/2019 which stated patient may hold her Plavix for 3 to 5 days per the discretion of GI.   Thanks

## 2020-02-02 NOTE — Telephone Encounter (Signed)
Patient is advised of the recommendations. She will come for follow up non-fasting labs in a couple of weeks. Order placed in [place]. She has already taken her Plavix today, but she will hold it starting tomorrow 02/03/20 for her procedure on 02/07/20. Confirmed she goes for the COVID test tomorrow.

## 2020-02-03 ENCOUNTER — Other Ambulatory Visit (HOSPITAL_COMMUNITY)
Admission: RE | Admit: 2020-02-03 | Discharge: 2020-02-03 | Disposition: A | Discharge status: Home / Self Care | Payer: Medicare Other | Source: Ambulatory Visit | Attending: Gastroenterology | Admitting: Gastroenterology

## 2020-02-03 DIAGNOSIS — Z01812 Encounter for preprocedural laboratory examination: Secondary | ICD-10-CM | POA: Diagnosis present

## 2020-02-03 DIAGNOSIS — Z20822 Contact with and (suspected) exposure to covid-19: Secondary | ICD-10-CM | POA: Insufficient documentation

## 2020-02-03 LAB — SARS CORONAVIRUS 2 (TAT 6-24 HRS): SARS Coronavirus 2: NEGATIVE

## 2020-02-04 NOTE — Progress Notes (Signed)
Pre-op call done for endo procedure, patient states she had been quarantined since [place] test. She confirmed she has a driver and will be here at [place] on 02/07/20. All questions addressed.

## 2020-02-06 NOTE — Anesthesia Preprocedure Evaluation (Addendum)
Anesthesia Evaluation  Patient identified by MRN, date of birth, ID band Patient awake    Reviewed: Allergy & Precautions, NPO status , Patient's Chart, lab work & pertinent test results  Airway Mallampati: II  TM Distance: >3 FB Neck ROM: Full    Dental no notable dental hx. (+) Teeth Intact, Dental Advisory Given   Pulmonary asthma , COPD, former smoker,  Lung CA   Pulmonary exam normal breath sounds clear to auscultation       Cardiovascular hypertension, Pt. on home beta blockers and Pt. on medications + CAD and +CHF  Normal cardiovascular exam Rhythm:Regular Rate:Normal  09/16/2018  Echo -------------------------------------------------------------------  Study Conclusions   - Left ventricle: The cavity size was normal. Wall thickness was  normal. Systolic function was vigorous. The estimated ejection  fraction was in the range of 65% to 70%. Doppler parameters are  consistent with abnormal left ventricular relaxation (grade 1  diastolic dysfunction).   -------------------------------------------------------------------  Study data: Comparison was made to the study of 08/15/2017. Study  status: Routine. Procedure: The patient reported no pain pre or  post test. Transthoracic echocardiography. Image quality was  adequate.     Transthoracic echocardiography. M-mode,  complete 2D, spectral Doppler, and color Doppler. Birthdate:  Patient birthdate: 11/02/1965. Age: Patient is 54 yr old. Sex:  Gender: female.  BMI: 27.6 kg/m^2. Blood pressure:   95/72  Patient status: Inpatient. Study date: Study date: 09/16/2018.  Study time: 02:01 PM. Location: Echo laboratory.   -------------------------------------------------------------------   -------------------------------------------------------------------  Left ventricle: The cavity size was normal. Wall thickness was  normal. Systolic function  was vigorous. The estimated ejection  fraction was in the range of 65% to 70%. Doppler parameters are  consistent with abnormal left ventricular relaxation (grade 1  diastolic dysfunction).    Neuro/Psych Seizures -,  negative psych ROS   GI/Hepatic Neg liver ROS, GERD  ,Colon CA   Endo/Other  negative endocrine ROS  Renal/GU negative Renal ROS     Musculoskeletal negative musculoskeletal ROS (+)   Abdominal   Peds  Hematology negative hematology ROS (+)   Anesthesia Other Findings   Reproductive/Obstetrics negative OB ROS                           Anesthesia Physical Anesthesia Plan  ASA: II  Anesthesia Plan: MAC   Post-op Pain Management:    Induction:   PONV Risk Score and Plan:   Airway Management Planned: Nasal Cannula and Natural Airway  Additional Equipment:   Intra-op Plan:   Post-operative Plan:   Informed Consent: I have reviewed the patients History and Physical, chart, labs and discussed the procedure including the risks, benefits and alternatives for the proposed anesthesia with the patient or authorized representative who has indicated his/her understanding and acceptance.     Dental advisory given  Plan Discussed with: CRNA  Anesthesia Plan Comments: (Colonscopy for)       Anesthesia Quick Evaluation

## 2020-02-07 ENCOUNTER — Ambulatory Visit (HOSPITAL_COMMUNITY)
Admission: RE | Admit: 2020-02-07 | Discharge: 2020-02-07 | Disposition: A | Discharge status: Home / Self Care | Payer: Medicare Other | Attending: Gastroenterology | Admitting: Gastroenterology

## 2020-02-07 ENCOUNTER — Ambulatory Visit (HOSPITAL_COMMUNITY): Payer: Medicare Other | Admitting: Certified Registered Nurse Anesthetist

## 2020-02-07 ENCOUNTER — Encounter (HOSPITAL_COMMUNITY): Payer: Self-pay | Admitting: Gastroenterology

## 2020-02-07 ENCOUNTER — Encounter (HOSPITAL_COMMUNITY)
Admission: RE | Disposition: A | Discharge status: Home / Self Care | Payer: Self-pay | Source: Home / Self Care | Attending: Gastroenterology

## 2020-02-07 ENCOUNTER — Other Ambulatory Visit: Payer: Self-pay

## 2020-02-07 DIAGNOSIS — D125 Benign neoplasm of sigmoid colon: Secondary | ICD-10-CM

## 2020-02-07 DIAGNOSIS — K648 Other hemorrhoids: Secondary | ICD-10-CM | POA: Diagnosis not present

## 2020-02-07 DIAGNOSIS — Z860100 Personal history of colon polyps, unspecified: Secondary | ICD-10-CM

## 2020-02-07 DIAGNOSIS — I2510 Atherosclerotic heart disease of native coronary artery without angina pectoris: Secondary | ICD-10-CM | POA: Insufficient documentation

## 2020-02-07 DIAGNOSIS — Z955 Presence of coronary angioplasty implant and graft: Secondary | ICD-10-CM | POA: Insufficient documentation

## 2020-02-07 DIAGNOSIS — I110 Hypertensive heart disease with heart failure: Secondary | ICD-10-CM | POA: Insufficient documentation

## 2020-02-07 DIAGNOSIS — Z85118 Personal history of other malignant neoplasm of bronchus and lung: Secondary | ICD-10-CM | POA: Insufficient documentation

## 2020-02-07 DIAGNOSIS — Z85038 Personal history of other malignant neoplasm of large intestine: Secondary | ICD-10-CM

## 2020-02-07 DIAGNOSIS — Z801 Family history of malignant neoplasm of trachea, bronchus and lung: Secondary | ICD-10-CM | POA: Insufficient documentation

## 2020-02-07 DIAGNOSIS — Z1211 Encounter for screening for malignant neoplasm of colon: Secondary | ICD-10-CM | POA: Insufficient documentation

## 2020-02-07 DIAGNOSIS — Z85820 Personal history of malignant melanoma of skin: Secondary | ICD-10-CM | POA: Insufficient documentation

## 2020-02-07 DIAGNOSIS — J449 Chronic obstructive pulmonary disease, unspecified: Secondary | ICD-10-CM | POA: Diagnosis not present

## 2020-02-07 DIAGNOSIS — I5032 Chronic diastolic (congestive) heart failure: Secondary | ICD-10-CM | POA: Insufficient documentation

## 2020-02-07 DIAGNOSIS — Z803 Family history of malignant neoplasm of breast: Secondary | ICD-10-CM | POA: Diagnosis not present

## 2020-02-07 DIAGNOSIS — Z87891 Personal history of nicotine dependence: Secondary | ICD-10-CM | POA: Insufficient documentation

## 2020-02-07 DIAGNOSIS — Z8709 Personal history of other diseases of the respiratory system: Secondary | ICD-10-CM

## 2020-02-07 HISTORY — PX: COLONOSCOPY WITH PROPOFOL: SHX5780

## 2020-02-07 HISTORY — PX: POLYPECTOMY: SHX5525

## 2020-02-07 SURGERY — COLONOSCOPY WITH PROPOFOL
Anesthesia: Monitor Anesthesia Care

## 2020-02-07 MED ORDER — SODIUM CHLORIDE 0.9 % IV SOLN: INTRAVENOUS | Status: DC

## 2020-02-07 MED ORDER — LIDOCAINE 2% (20 MG/ML) 5 ML SYRINGE
INTRAMUSCULAR | Status: DC | PRN
  Administered 2020-02-07: 80 mg via INTRAVENOUS

## 2020-02-07 MED ORDER — PROPOFOL 500 MG/50ML IV EMUL
INTRAVENOUS | Status: AC
  Filled 2020-02-07: qty 50

## 2020-02-07 MED ORDER — PROPOFOL 500 MG/50ML IV EMUL
INTRAVENOUS | Status: DC | PRN
  Administered 2020-02-07: 100 ug/kg/min via INTRAVENOUS

## 2020-02-07 MED ORDER — PROPOFOL 10 MG/ML IV BOLUS
INTRAVENOUS | Status: AC
  Filled 2020-02-07: qty 20

## 2020-02-07 MED ORDER — LACTATED RINGERS IV SOLN: INTRAVENOUS | Status: DC

## 2020-02-07 MED ORDER — PROPOFOL 10 MG/ML IV BOLUS
INTRAVENOUS | Status: DC | PRN
  Administered 2020-02-07 (×2): 20 mg via INTRAVENOUS
  Administered 2020-02-07: 30 mg via INTRAVENOUS
  Administered 2020-02-07: 20 mg via INTRAVENOUS

## 2020-02-07 SURGICAL SUPPLY — 22 items

## 2020-02-07 NOTE — H&P (Signed)
HPI :  54 y/o female with a history of malignant polyp removed in 2016 per Dr. Olevia Perches, here for surveillance colonoscopy. 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 She denies any constipation / diarrhea. Last dose Plavix 5 days ago. She denies any cardiopulmonary symptoms. Uses oxygen PRN, case done at the hospital for anesthesia support.   Past Medical History:  Diagnosis Date  . Acute bilateral low back pain without sciatica   . Acute congestive heart failure ([place]) 05/02/2017   EF 20-25% on ECHO  . Acute respiratory failure with hypoxia ([place]) 05/01/2017  . 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 Allergic rhinitis   . Allergy   . Anxiety disorder   . Aortic valve regurgitation 05/15/2017  . Asthma   . Chronic bronchitis   . Chronic diastolic heart failure ([place]) 12/22/2019  . 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 COPD (chronic obstructive pulmonary disease) ([place])   . Cough   . Dizziness and giddiness   . Dyspepsia   . Dyspnea 04/30/2017  . Emphysema of lung ([place])   . External hemorrhoids   . EXTERNAL HEMORRHOIDS 09/27/2009  . Female stress incontinence   . Genetic testing 08/18/2016   Negative for known pathogenic mutations within any of 25 genes on a Custom Panel through [employer].  One variant of uncertain significance (VUS) called "c.7187C>G (p.Thr2396Ser)" was found in one copy of the ATM gene.  This Custom Cancer Panel offered by [employer] includes sequencing and/or duplication/deletion testing of the following 25 genes: APC, ATM, AXIN2, BAP1, BMPR1A, BRCA1, BRCA2, CDH1, CDK4, CDKN2A, CHEK2, EPCAM, MITF, MLH1, MSH2, MSH6, MUTYH, PMS2, POLD1, POLE, PTEN, SCG5/GREM1, SMAD4, STK11, and TP53. Date of report is July 23, 2016.  MSH2 Exons 1-7 Inversion Analysis was also negative through [employer].  Date of report is July 23, 2016.     Marland Kitchen GERD  (gastroesophageal reflux disease)    occ depending on diet   . Goiter   . History of lung cancer   . Hypercholesteremia    denies-last check normal labs  . Hypokalemia   . 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 Insomnia   . Lung cancer ([place])   . Migraine, unspecified, without mention of intractable migraine without mention of status migrainosus   . Panic attacks   . Seizures ([place])    very long ago like 15 years ago or so , questionable etilogy  . Skin cancer (melanoma) ([place])    right calf  . Snoring 12/11/2010   Longstanding hx of snore. NPSG was normal years ago.    Marland Kitchen Swelling of right lower extremity 12/01/2014     Past Surgical History:  Procedure Laterality Date  . ABDOMINAL HYSTERECTOMY  2001   no BSO  . biopsy of right neck mass     negative-thyroid biopsy   . BREAST EXCISIONAL BIOPSY Right 1987   no visible scar  . BRONCHOSCOPY  01/31/2005   nonspecific inflammation  . BRONCHOSCOPY  08/24/2010   nonspecific inflammation  . COLONOSCOPY    . CORONARY STENT INTERVENTION N/A 05/12/2017   Procedure: CORONARY STENT INTERVENTION;  Surgeon: Charolette Forward, MD;  Location: [facility];  Service: Cardiovascular;  Laterality: N/A;  . LUNG REMOVAL, PARTIAL     for lung cancer--followed by Dr. Arlyce Dice and Dr. Annamaria Boots  . MELANOMA EXCISION WITH SENTINEL LYMPH NODE BIOPSY Right 11/29/2014  Procedure: RIGHT INGUINAL SENTINEL LYMPH NODE BIOPSY;  Surgeon: Georganna Skeans, MD;  Location: [facility];  Service: General;  Laterality: Right;  . melanoma removal     right calf   . POLYPECTOMY    . portacath removed  10/2007  . RIGHT/LEFT HEART CATH AND CORONARY ANGIOGRAPHY N/A 05/06/2017   Procedure: RIGHT/LEFT HEART CATH AND CORONARY ANGIOGRAPHY;  Surgeon: [name], MD;  Location: [facility];  Service: Cardiovascular;  Laterality: N/A;  . SPIROMETRY  07/03/2002   min obstruction,mild restriction 01/31/2005  . tvh  12/01/2000   hysterectomy   Family  History  Problem Relation Age of Onset  . Lung cancer Father        smoker and worked at a [employer]  . COPD Mother        not a smoker  . Other Mother        hx of hysterectomy for unspecified reason  . Breast cancer Mother   . Asthma Daughter   . Diabetes Maternal Grandfather   . Colon polyps Brother        approx 16 polyps on his first colonoscopy  . Other Maternal Aunt        non-cancerous growth in lungs; respiratory issues; smoker  . COPD Maternal Grandmother        d. 55  . Cancer Paternal Grandfather        oral/mouth cancer, chewed tobacco, dx at older age  . Throat cancer Other        maternal great aunt (MGF's sister); not a smoker  . Cirrhosis Paternal Uncle        hx of alcohol abuse   . Colon cancer Neg Hx   . Rectal cancer Neg Hx   . Stomach cancer Neg Hx    Social History   Tobacco Use  . Smoking status: Former Smoker    Packs/day: 2.00    Years: 23.00    Pack years: 46.00    Types: Cigarettes    Start date: 09/02/1978    Quit date: 10/26/2001    Years since quitting: 18.2  . Smokeless tobacco: Never Used  . Tobacco comment: Significant passive exposure from boy friend  Substance Use Topics  . Alcohol use: No    Alcohol/week: 0.0 standard drinks    Comment: history of alcohol abuse 20-30  . Drug use: No    Comment: +MJ   Current Facility-Administered Medications  Medication Dose Route Frequency Provider Last Rate Last Admin  . 0.9 %  sodium chloride infusion   Intravenous Continuous Jewelz Ricklefs, Carlota Raspberry, MD      . lactated ringers infusion   Intravenous Continuous Maekayla Giorgio, Carlota Raspberry, MD 10 mL/hr at 02/07/20 0826 New Bag at 02/07/20 0826   No Known Allergies   Review of Systems: All systems reviewed and negative except where noted in HPI.   Lab Results  Component Value Date   WBC 6.7 06/24/2019   HGB 12.1 06/24/2019   HCT 35.2 (L) 06/24/2019   MCV 84.0 06/24/2019   PLT 215 06/24/2019    Lab Results  Component Value Date    CREATININE 0.74 12/20/2019   BUN 12 12/20/2019   NA 141 12/20/2019   K 4.3 12/20/2019   CL 103 12/20/2019   CO2 22 12/20/2019    Lab Results  Component Value Date   ALT 8 12/20/2019   AST 14 12/20/2019   ALKPHOS 133 (H) 12/20/2019   BILITOT 0.8 12/20/2019     Physical Exam:  BP 112/63   Pulse 90   Temp 98 F (36.7 C) (Oral)   Resp 14   Ht '5\' 4"'  (1.626 m)   Wt 77.1 kg   SpO2 92%   BMI 29.18 kg/m  Constitutional: Pleasant,well-developed, female in no acute distress. Cardiovascular: Normal rate, regular rhythm.  Pulmonary/chest: Effort normal and breath sounds normal.  Abdominal: Soft, nondistended, nontender.   Extremities: no edema   ASSESSMENT AND PLAN: 54 y/o female here for surveillance colonoscopy for history of malignant colon polyp and other colon polyps removed in the past. Last exam 2018. She takes plavix chronically, last dose 5 days ago, history of intermittent oxygen use for COPD. Case done at the hospital for anesthesia support. I have discussed risks / benefits of endoscopy and anesthesia and she wishes to proceed. Further recommendations pending the results.  Cuba Cellar, MD [facility]

## 2020-02-07 NOTE — Op Note (Signed)
[facility] Patient Name: [name] Procedure Date: 02/07/2020 MRN: [id number] Attending MD: Carlota Raspberry. Havery Moros , MD Date of Birth: [birth year] CSN: [id number] Age: 54 Admit Type: Outpatient Procedure:                Colonoscopy Indications:              High risk colon cancer surveillance: Personal                            history of malignant colon polyp 2016, history of                            colon polyps, last exam 2018 Providers:                Remo Lipps P. Havery Moros, MD, Angus Seller, Lazaro Arms, Technician Referring MD:              Medicines:                Monitored Anesthesia Care Complications:            No immediate complications. Estimated blood loss:                            Minimal. Estimated Blood Loss:     Estimated blood loss was minimal. Procedure:                Pre-Anesthesia Assessment:                           - Prior to the procedure, a History and Physical                            was performed, and patient medications and                            allergies were reviewed. The patient's tolerance of                            previous anesthesia was also reviewed. The risks                            and benefits of the procedure and the sedation                            options and risks were discussed with the patient.                            All questions were answered, and informed consent                            was obtained. Prior Anticoagulants: The patient has                            taken Plavix (  clopidogrel), last dose was 5 days                            prior to procedure. ASA Grade Assessment: III - A                            patient with severe systemic disease. After                            reviewing the risks and benefits, the patient was                            deemed in satisfactory condition to undergo the                            procedure.              After obtaining informed consent, the colonoscope                            was passed under direct vision. Throughout the                            procedure, the patient's blood pressure, pulse, and                            oxygen saturations were monitored continuously. The                            PCF-H190DL ([id number]) Olympus pediatric colonscope                            was introduced through the anus and advanced to the                            the cecum, identified by appendiceal orifice and                            ileocecal valve. The colonoscopy was performed                            without difficulty. The patient tolerated the                            procedure well. The quality of the bowel                            preparation was good. The ileocecal valve,                            appendiceal orifice, and rectum were photographed. Scope In: 8:49:38 AM Scope Out: 9:09:29 AM Scope Withdrawal Time: 0 hours 16 minutes 34 seconds  Total Procedure Duration: 0 hours 19 minutes 51 seconds  Findings:      The perianal and digital rectal examinations  were normal.      A medium post polypectomy scar was found in the ascending colon. The       scar tissue was healthy in appearance.      A 3 mm polyp was found in the sigmoid colon. The polyp was sessile. The       polyp was removed with a cold snare. Resection and retrieval were       complete.      Internal hemorrhoids were found during retroflexion, along with stigmata       of prior banding / scarring. The hemorrhoids were small.      The exam was otherwise without abnormality. Impression:               - Post-polypectomy scar in the ascending colon.                           - One 3 mm polyp in the sigmoid colon, removed with                            a cold snare. Resected and retrieved.                           - Internal hemorrhoids.                           - The examination was otherwise  normal. Moderate Sedation:      No moderate sedation, case performed with MAC Recommendation:           - Patient has a contact number available for                            emergencies. The signs and symptoms of potential                            delayed complications were discussed with the                            patient. Return to normal activities tomorrow.                            Written discharge instructions were provided to the                            patient.                           - Resume previous diet.                           - Continue present medications.                           - Resume Plavix tomorrow                           - Await pathology results.                           -  Anticipate repeat colonoscopy in 5 years for                            surveillance purposes Procedure Code(s):        --- Professional ---                           [phone], Colonoscopy, flexible; with removal of                            tumor(s), polyp(s), or other lesion(s) by snare                            technique Diagnosis Code(s):        --- Professional ---                           D17.616, Personal history of other malignant                            neoplasm of large intestine                           Z98.890, Other specified postprocedural states                           K63.5, Polyp of colon                           K64.8, Other hemorrhoids CPT copyright 2019 American Medical Association. All rights reserved. The codes documented in this report are preliminary and upon coder review may  be revised to meet current compliance requirements. Remo Lipps P. Draven Natter, MD 02/07/2020 9:15:23 AM This report has been signed electronically. Number of Addenda: 0

## 2020-02-07 NOTE — Interval H&P Note (Signed)
History and Physical Interval Note:  02/07/2020 8:34 AM  [name]  has presented today for surgery, with the diagnosis of hx of colon cancer, hx of lung cancer, COPD.  The various methods of treatment have been discussed with the patient and family. After consideration of risks, benefits and other options for treatment, the patient has consented to  Procedure(s): COLONOSCOPY WITH PROPOFOL (N/A) as a surgical intervention.  The patient's history has been reviewed, patient examined, no change in status, stable for surgery.  I have reviewed the patient's chart and labs.  Questions were answered to the patient's satisfaction.     [place]

## 2020-02-07 NOTE — Transfer of Care (Signed)
Immediate Anesthesia Transfer of Care Note  Patient: [name]  Procedure(s) Performed: COLONOSCOPY WITH PROPOFOL (N/A ) POLYPECTOMY  Patient Location: Endoscopy Unit  Anesthesia Type:MAC  Level of Consciousness: drowsy and patient cooperative  Airway & Oxygen Therapy: Patient Spontanous Breathing and Patient connected to face mask oxygen  Post-op Assessment: Report given to RN and Post -op Vital signs reviewed and stable  Post vital signs: Reviewed and stable  Last Vitals:  Vitals Value Taken Time  BP    Temp    Pulse 87 02/07/20 0915  Resp 23 02/07/20 0915  SpO2 100 % 02/07/20 0915  Vitals shown include unvalidated device data.  Last Pain:  Vitals:   02/07/20 0817  TempSrc: Oral  PainSc: 0-No pain         Complications: No apparent anesthesia complications

## 2020-02-07 NOTE — Discharge Instructions (Signed)
YOU HAD AN ENDOSCOPIC PROCEDURE TODAY: Refer to the procedure report and other information in the discharge instructions given to you for any specific questions about what was found during the examination. If this information does not answer your questions, please call [place] office at [phone] to clarify.  ° °YOU SHOULD EXPECT: Some feelings of bloating in the abdomen. Passage of more gas than usual. Walking can help get rid of the air that was put into your GI tract during the procedure and reduce the bloating. If you had a lower endoscopy (such as a colonoscopy or flexible sigmoidoscopy) you may notice spotting of blood in your stool or on the toilet paper. Some abdominal soreness may be present for a day or two, also. ° °DIET: Your first meal following the procedure should be a light meal and then it is ok to progress to your normal diet. A half-sandwich or bowl of soup is an example of a good first meal. Heavy or fried foods are harder to digest and may make you feel nauseous or bloated. Drink plenty of fluids but you should avoid alcoholic beverages for 24 hours. If you had a esophageal dilation, please see attached instructions for diet.   ° °ACTIVITY: Your care partner should take you home directly after the procedure. You should plan to take it easy, moving slowly for the rest of the day. You can resume normal activity the day after the procedure however YOU SHOULD NOT DRIVE, use power tools, machinery or perform tasks that involve climbing or major physical exertion for 24 hours (because of the sedation medicines used during the test).  ° °SYMPTOMS TO REPORT IMMEDIATELY: °A gastroenterologist can be reached at any hour. Please call [phone]  for any of the following symptoms:  °Following lower endoscopy (colonoscopy, flexible sigmoidoscopy) °Excessive amounts of blood in the stool  °Significant tenderness, worsening of abdominal pains  °Swelling of the abdomen that is new, acute  °Fever of 100° or  higher  °Following upper endoscopy (EGD, EUS, ERCP, esophageal dilation) °Vomiting of blood or coffee ground material  °New, significant abdominal pain  °New, significant chest pain or pain under the shoulder blades  °Painful or persistently difficult swallowing  °New shortness of breath  °Black, tarry-looking or red, bloody stools ° °FOLLOW UP:  °If any biopsies were taken you will be contacted by phone or by letter within the next 1-3 weeks. Call [phone]  if you have not heard about the biopsies in 3 weeks.  °Please also call with any specific questions about appointments or follow up tests. ° °

## 2020-02-07 NOTE — Anesthesia Postprocedure Evaluation (Signed)
Anesthesia Post Note  Patient: [name]  Procedure(s) Performed: COLONOSCOPY WITH PROPOFOL (N/A ) POLYPECTOMY     Patient location during evaluation: PACU Anesthesia Type: MAC Level of consciousness: awake and alert Pain management: pain level controlled Vital Signs Assessment: post-procedure vital signs reviewed and stable Respiratory status: spontaneous breathing, nonlabored ventilation, respiratory function stable and patient connected to nasal cannula oxygen Cardiovascular status: stable and blood pressure returned to baseline Postop Assessment: no apparent nausea or vomiting Anesthetic complications: no    Last Vitals:  Vitals:   02/07/20 0817  BP: 112/63  Pulse: 90  Resp: 14  Temp: 36.7 C  SpO2: 92%    Last Pain:  Vitals:   02/07/20 0817  TempSrc: Oral  PainSc: 0-No pain                 Barnet Glasgow

## 2020-02-08 ENCOUNTER — Encounter: Payer: Self-pay | Admitting: *Deleted

## 2020-02-08 LAB — SURGICAL PATHOLOGY

## 2020-02-17 ENCOUNTER — Other Ambulatory Visit: Payer: Self-pay | Admitting: Internal Medicine

## 2020-02-17 MED ORDER — ZOLPIDEM TARTRATE 5 MG PO TABS: ORAL_TABLET | ORAL | 5 refills | Status: DC

## 2020-02-28 ENCOUNTER — Ambulatory Visit: Payer: Medicare Other | Admitting: Internal Medicine

## 2020-03-08 ENCOUNTER — Other Ambulatory Visit: Payer: Self-pay | Admitting: *Deleted

## 2020-03-08 MED ORDER — IPRATROPIUM-ALBUTEROL 0.5-2.5 (3) MG/3ML IN SOLN: RESPIRATORY_TRACT | 3 refills | Status: DC

## 2020-04-13 ENCOUNTER — Encounter: Payer: Self-pay | Admitting: Internal Medicine

## 2020-04-13 ENCOUNTER — Ambulatory Visit (INDEPENDENT_AMBULATORY_CARE_PROVIDER_SITE_OTHER): Payer: Medicare Other

## 2020-04-13 ENCOUNTER — Ambulatory Visit (INDEPENDENT_AMBULATORY_CARE_PROVIDER_SITE_OTHER): Payer: Medicare Other | Admitting: Internal Medicine

## 2020-04-13 ENCOUNTER — Other Ambulatory Visit: Payer: Self-pay

## 2020-04-13 VITALS — BP 118/82 | HR 92 | Temp 97.3°F | Ht 65.0 in | Wt 178.2 lb

## 2020-04-13 DIAGNOSIS — J449 Chronic obstructive pulmonary disease, unspecified: Secondary | ICD-10-CM | POA: Diagnosis not present

## 2020-04-13 DIAGNOSIS — J441 Chronic obstructive pulmonary disease with (acute) exacerbation: Secondary | ICD-10-CM | POA: Diagnosis not present

## 2020-04-13 DIAGNOSIS — C3491 Malignant neoplasm of unspecified part of right bronchus or lung: Secondary | ICD-10-CM

## 2020-04-13 DIAGNOSIS — J9611 Chronic respiratory failure with hypoxia: Secondary | ICD-10-CM

## 2020-04-13 MED ORDER — METHYLPREDNISOLONE ACETATE 80 MG/ML IJ SUSP: 80.0000 mg | Freq: Once | INTRAMUSCULAR | Status: AC

## 2020-04-13 MED ORDER — BREZTRI AEROSPHERE 160-9-4.8 MCG/ACT IN AERO: 2.0000 | INHALATION_SPRAY | Freq: Two times a day (BID) | RESPIRATORY_TRACT | 0 refills | Status: DC

## 2020-04-13 MED ORDER — ALBUTEROL SULFATE HFA 108 (90 BASE) MCG/ACT IN AERS: INHALATION_SPRAY | RESPIRATORY_TRACT | 12 refills | Status: DC

## 2020-04-13 NOTE — Progress Notes (Signed)
Patient ID: [name], female    DOB: 07-16-1966, 54 y.o.   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 Walk test on room air 06/28/2016-95%, 95%, 95%, 93%, peak heart rate 120/minute. No desaturation after 3185 feet. Office Spirometry 06/28/2016-limited validity due to cough. Restriction of exhaled volume. FVC 1.79/50%, FEV1 1.64/57%, ratio 0.80. FENO- 5 Walk Test on room Air- Qualified for home O2 04/10/17 PFT 06/17/17- severe obstruction, mild restriction, diffusion severely reduced. Insignificant response to bronchodilator. FVC 1.73/47%, FEV1 1.10/38%, ratio 0.64, TLC 77%, DLCO 32% CHF- acute hosp 05/2017- CHF, CAD, EF 20-25%. Stent. 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 O2 1-2L/MIN Has had 1 Covax Used rescue yest. Has POC on 1L today. Using  O2 at 1-2L. Now off prednisone. Pending f/u colonoscopy. Needs Breo refilled, but has felt stable without recent exacerbation. DOE with little cough or wheeze most days. No blood.  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 "weather" She wanted reassurance no pneumonia.  Mucinex some help. Comfortable with O2 at night.  CXR 04/13/20- looks stable to my review, pending Radiologist report   Review of Systems-+ = positive Constitutional:   No-   weight loss, night sweats, fevers, chills, fatigue, lassitude. HEENT:   No-  headaches, difficulty swallowing, tooth/dental problems, sore throat,      mild sneezing,no- itching, ear ache, nasal congestion, post nasal drip,  CV:  No-   chest pain, orthopnea, PND, swelling in lower extremities, anasarca, dizziness, palpitations Resp: + shortness of breath with exertion or at rest.              +productive cough, + non-productive cough,  No- coughing up of blood.               change in color of mucus. + wheezing.   Skin: No-   rash or lesions. GI:  No-   heartburn, indigestion, abdominal pain, nausea, vomiting,  GU:   MS:  No-   joint pain or swelling.   Neuro-     nothing unusual Psych:  No- change in mood or affect. No depression or anxiety.  No memory loss.   Objective:   Physical Exam    Has POC General- Alert, Oriented, Affect-appropriate,  Distress - NAD, + overweight   Skin- rash-none, lesions- none, excoriation- none.  Lymphadenopathy- none Head- atraumatic             Eyes- Gross vision intact, PERRLA, conjunctivae clear secretions            Ears- Hearing, canals-normal            Nose- , no-Septal dev, mucus, polyps, erosion, perforation             Throat- Mallampati IVI , mucosa clear , drainage- none, tonsils- atrophic,  Neck- flexible , trachea midline, no stridor , thyroid nl, carotid no bruit Chest - symmetrical excursion , unlabored           Heart/CV- RRR rapid , no murmur , no gallop  , no rub, nl s1 s2                           - JVD none , edema- none, stasis changes- none, varices- none           Lung-   Clear, Wheeze+, cough+, dullness-none, rub- none. Rales- none           Chest wall-  Abd-  Br/ Gen/ Rectal- Not done, not indicated Extrem- cyanosis- none, clubbing, none, atrophy- none, strength- nl,    Neuro- grossly intact to observation

## 2020-04-13 NOTE — Patient Instructions (Signed)
Script sent asking pharmacy to give either Ventolin or Proair, hfa form  Sample x 2 Breztri inhaler  Inhale 2 puffs, then rinse mouth, twice daily  Maintenance Try this for comparison instead of Breo. If it works much better, let us know. Otherwise go back to [facility]  Order-  Depo 74    Dx COPD exacerbation

## 2020-04-18 NOTE — Assessment & Plan Note (Signed)
She does use and benefit from nocturnal O2

## 2020-04-18 NOTE — Assessment & Plan Note (Signed)
No recurrence so far. CXR stable pending radiology review.

## 2020-04-18 NOTE — Assessment & Plan Note (Signed)
Nonspecific exacerbation- likely weather or viral Plan- sample trial Breztri. At her request, changing generic albuterol rescue to Proair or Ventolin.

## 2020-05-07 ENCOUNTER — Other Ambulatory Visit: Payer: Self-pay | Admitting: Cardiovascular Disease

## 2020-05-26 ENCOUNTER — Other Ambulatory Visit: Payer: Self-pay

## 2020-05-26 MED ORDER — BREZTRI AEROSPHERE 160-9-4.8 MCG/ACT IN AERO: 2.0000 | INHALATION_SPRAY | Freq: Two times a day (BID) | RESPIRATORY_TRACT | 3 refills | Status: DC

## 2020-06-16 ENCOUNTER — Other Ambulatory Visit: Payer: Self-pay | Admitting: Cardiovascular Disease

## 2020-06-21 ENCOUNTER — Telehealth: Payer: Self-pay | Admitting: Cardiovascular Disease

## 2020-06-21 ENCOUNTER — Other Ambulatory Visit: Payer: Self-pay | Admitting: Oncology

## 2020-06-21 MED ORDER — SPIRONOLACTONE 25 MG PO TABS: ORAL_TABLET | ORAL | 3 refills | Status: DC

## 2020-06-21 NOTE — Telephone Encounter (Signed)
The pts RX for  spironolactone resent to the [place], [facility].

## 2020-06-21 NOTE — Telephone Encounter (Signed)
*  STAT* If patient is at the pharmacy, call can be transferred to refill team.   1. Which medications need to be refilled? (please list name of each medication and dose if known) spironolactone (ALDACTONE) 25 MG tablet  2. Which pharmacy/location (including street and city if local pharmacy) is medication to be sent to? [place], [place] - [address] AT [place]  3. Do they need a 30 day or 90 day supply? 90 day supply

## 2020-06-23 ENCOUNTER — Other Ambulatory Visit: Payer: Self-pay | Admitting: Cardiovascular Disease

## 2020-06-23 ENCOUNTER — Other Ambulatory Visit: Payer: Self-pay

## 2020-06-23 ENCOUNTER — Other Ambulatory Visit: Payer: Self-pay | Admitting: *Deleted

## 2020-06-23 DIAGNOSIS — C3491 Malignant neoplasm of unspecified part of right bronchus or lung: Secondary | ICD-10-CM

## 2020-06-23 MED ORDER — POTASSIUM CHLORIDE ER 10 MEQ PO TBCR: 10.0000 meq | EXTENDED_RELEASE_TABLET | Freq: Every day | ORAL | 4 refills | Status: DC

## 2020-06-23 MED ORDER — SPIRONOLACTONE 25 MG PO TABS: ORAL_TABLET | ORAL | 2 refills | Status: DC

## 2020-06-23 NOTE — Telephone Encounter (Signed)
    *  STAT* If patient is at the pharmacy, call can be transferred to refill team.   1. Which medications need to be refilled? (please list name of each medication and dose if known)  potassium chloride (KLOR-CON) 10 MEQ tablet   2. Which pharmacy/location (including street and city if local pharmacy) is medication to be sent to? [place], [place] - [address] AT [place]  3. Do they need a 30 day or 90 day supply? 90 days  Pt said she was told by her pharmacy to call Dr. Oval Linsey to refill this medication for her

## 2020-06-23 NOTE — Telephone Encounter (Signed)
Pt called stating she called her pharmacy for K+ refill and they told her Dr Jana Hakim would not be refilling this medication and that she needed to have it refilled with Dr Oval Linsey. There are no notes indicating this and Dr Jana Hakim is OK with refilling as he is the one who prescribed it. Pt understands this has been refilled to her preferred pharmacy and verbalizes thanks.

## 2020-06-25 NOTE — Progress Notes (Signed)
[facility]  Telephone:(336) [phone] Fax:(336) [phone]     ID: MATTINGLY FOUNTAINE DOB: 1966/09/01  MR#: [id number]  WCB#:[id number]  Patient Care Team: Leanna Battles, MD as PCP - General (Internal Medicine) Skeet Latch, MD as PCP - Cardiology (Cardiology) Deneise Lever, MD (Pulmonary Disease) Harriett Sine, MD as Consulting Physician (Dermatology) Armbruster, Carlota Raspberry, MD as Consulting Physician (Gastroenterology) Armetta Henri, Virgie Dad, MD as Consulting Physician (Oncology) Larey Dresser, MD as Consulting Physician (Cardiology) OTHER MD: Griselda Miner M.D., Rob Hickman M.D., Georganna Skeans MD  CHIEF COMPLAINT: malignant melanoma, colon carcinoma, lung cancer  CURRENT TREATMENT: Observation   INTERVAL HISTORY: Brandie returns today for follow-up of her remote lung cancer, and her more recent colon and melanoma cancers. She continues under observation.  Since her last visit, she underwent colonoscopy on 02/07/2020 under Dr. Havery Moros. Pathology from the procedure ([id number]) showed a tubular adenoma, negative for high-grade dysplasia or malignancy. Recall colonoscopy recommended in 5 years.  She also underwent bilateral screening mammography with tomography at [facility] on 09/06/2019 showing: breast density category C; no evidence of malignancy in either breast.   Routine chest x-ray on 04/13/2020 was stable.  She tells me she saw her dermatologist Dr. Elvera Lennox within the last month and no dangerous lesions were noted.  Recall she underwent cardiac catheterization on 05/12/2017 with the placement of a Sierra 15 mm drug-eluting stent 4 and ostial left main lesion.  She continues on antiplatelet agents, with some bruising but no overt bleeding   REVIEW OF SYSTEMS: Khole received the Moderna vaccine x2.  She is very compliant with all her treatments and finds that the more recent inhaler she is using Judithann Sauger) works better for her than  some of the earlier ones.  She bruises easily.  She continues Plavix.  She has had no change in bowel habits.  She is very limited in what she can do: She can walk around the driveway at a time or 2 and then she gets pretty short of breath.  A detailed review of systems today was otherwise stable   HISTORY OF PRESENT ILLNESS:  From the prior summary:  I know Ms. Friedt from her remote history of lung carcinoma, which has not recurred. More recently, her significant other [name] noticed a lesion in her right posterior calf. He keeps a picture Atlas of melanoma lesions on his bathroom wall, and told her he was "99% sure" the lesion there would be a melanoma. The patient brought it to Dr. Marjean Donna attention and a punch biopsy was obtained 09/06/2014. This showed ([id number]) malignant melanoma, with a maximum thickness of 0.33 mm, anatomic level III, no ulceration, with negative deep margins but clear peripheral margins. The mitotic index was low, less than one per square millimeter. There was no evidence of lymphovascular invasion. There was brisk tumor infiltrating lymphocytes. Tumor regression was noted across the full aspect of the punch biopsy.   This was read as a pathologic stage TIa, and the patient was referred to Dr. Sarajane Jews for wide excision, performed 09/13/2014. Dr. Everett Graff note indicates a 1 cm margin around the initial biopsy site. The final pathology ([id number]) showed a tumor thickness of 1.15, anatomic level III, with ulceration seen only in the area of prior biopsy, (and therefore read as negative). Peripheral as well as deep margins were free. Again the mitotic index was low there was no evidence of vascular invasion and tumor infiltrating lymphocytes were brisk. This was read as a pT2a lesion.  The patient is referred for further evaluation and treatment.   PAST MEDICAL HISTORY: Past Medical History:  Diagnosis Date  . Acute bilateral low back pain without sciatica   .  Acute congestive heart failure ([place]) 05/02/2017   EF 20-25% on ECHO  . Acute respiratory failure with hypoxia ([place]) 05/01/2017  . 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 Allergic rhinitis   . Allergy   . Anxiety disorder   . Aortic valve regurgitation 05/15/2017  . Asthma   . Chronic bronchitis   . Chronic diastolic heart failure ([place]) 12/22/2019  . Colon cancer ([place])    adenocarcinoma in a polyps 11-24-2014  . COPD (chronic obstructive pulmonary disease) ([place])   . Cough   . Dizziness and giddiness   . Dyspepsia   . Dyspnea 04/30/2017  . Emphysema of lung ([place])   . External hemorrhoids   . EXTERNAL HEMORRHOIDS 09/27/2009  . Female stress incontinence   . Genetic testing 08/18/2016   Negative for known pathogenic mutations within any of 25 genes on a Custom Panel through [employer].  One variant of uncertain significance (VUS) called "c.7187C>G (p.Thr2396Ser)" was found in one copy of the ATM gene.  This Custom Cancer Panel offered by [employer] includes sequencing and/or duplication/deletion testing of the following 25 genes: APC, ATM, AXIN2, BAP1, BMPR1A, BRCA1, BRCA2, CDH1, CDK4, CDKN2A, CHEK2, EPCAM, MITF, MLH1, MSH2, MSH6, MUTYH, PMS2, POLD1, POLE, PTEN, SCG5/GREM1, SMAD4, STK11, and TP53. Date of report is July 23, 2016.  MSH2 Exons 1-7 Inversion Analysis was also negative through [employer].  Date of report is July 23, 2016.     Marland Kitchen GERD (gastroesophageal reflux disease)    occ depending on diet   . Goiter   . History of lung cancer   . Hypercholesteremia    denies-last check normal labs  . Hypokalemia   . 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 Insomnia   . Lung cancer ([place])   . Migraine, unspecified, without mention of intractable migraine without mention of status migrainosus   . Panic attacks   . Seizures ([place])    very long ago like 15 years ago or so , questionable etilogy  . Skin cancer (melanoma) ([place])    right calf  . Snoring 12/11/2010    Longstanding hx of snore. NPSG was normal years ago.    Marland Kitchen Swelling of right lower extremity 12/01/2014    PAST SURGICAL HISTORY: Past Surgical History:  Procedure Laterality Date  . ABDOMINAL HYSTERECTOMY  2001   no BSO  . biopsy of right neck mass     negative-thyroid biopsy   . BREAST EXCISIONAL BIOPSY Right 1987   no visible scar  . BRONCHOSCOPY  01/31/2005   nonspecific inflammation  . BRONCHOSCOPY  08/24/2010   nonspecific inflammation  . COLONOSCOPY    . COLONOSCOPY WITH PROPOFOL N/A 02/07/2020   Procedure: COLONOSCOPY WITH PROPOFOL;  Surgeon: Yetta Flock, MD;  Location: [facility];  Service: Gastroenterology;  Laterality: N/A;  . CORONARY STENT INTERVENTION N/A 05/12/2017   Procedure: CORONARY STENT INTERVENTION;  Surgeon: Charolette Forward, MD;  Location: [facility];  Service: Cardiovascular;  Laterality: N/A;  . LUNG REMOVAL, PARTIAL     for lung cancer--followed by Dr. Arlyce Dice and Dr. Annamaria Boots  . MELANOMA EXCISION WITH SENTINEL LYMPH NODE BIOPSY Right 11/29/2014   Procedure: RIGHT INGUINAL SENTINEL LYMPH NODE BIOPSY;  Surgeon: Georganna Skeans, MD;  Location: [facility];  Service: General;  Laterality: Right;  . melanoma removal  right calf   . POLYPECTOMY    . POLYPECTOMY  02/07/2020   Procedure: POLYPECTOMY;  Surgeon: Yetta Flock, MD;  Location: [facility];  Service: Gastroenterology;;  . portacath removed  10/2007  . RIGHT/LEFT HEART CATH AND CORONARY ANGIOGRAPHY N/A 05/06/2017   Procedure: RIGHT/LEFT HEART CATH AND CORONARY ANGIOGRAPHY;  Surgeon: [name], MD;  Location: [facility];  Service: Cardiovascular;  Laterality: N/A;  . SPIROMETRY  07/03/2002   min obstruction,mild restriction 01/31/2005  . tvh  12/01/2000   hysterectomy    FAMILY HISTORY Family History  Problem Relation Age of Onset  . Lung cancer Father        smoker and worked at a Academic librarian  . COPD Mother        not a smoker  . Other Mother        hx of hysterectomy  for unspecified reason  . Breast cancer Mother   . Asthma Daughter   . Diabetes Maternal Grandfather   . Colon polyps Brother        approx 16 polyps on his first colonoscopy  . Other Maternal Aunt        non-cancerous growth in lungs; respiratory issues; smoker  . COPD Maternal Grandmother        d. 62  . Cancer Paternal Grandfather        oral/mouth cancer, chewed tobacco, dx at older age  . Throat cancer Other        maternal great aunt (MGF's sister); not a smoker  . Cirrhosis Paternal Uncle        hx of alcohol abuse   . Colon cancer Neg Hx   . Rectal cancer Neg Hx   . Stomach cancer Neg Hx    the patient's father died in his mid 23s from lung cancer. The patient's mother is living, in her 17s. The patient has one brother, no sisters.    GYNECOLOGIC HISTORY:  No LMP recorded. Patient has had a hysterectomy. Menarche age 38, first live birth age 63. The patient is GX P1. She had a hysterectomy approximately 2001. She did not undergo salpingo-oophorectomy. She did not take hormone replacement   SOCIAL HISTORY: (Updated October 2021) She is disabled and lives by herself, with a cat. She is divorced. Her significant other, Aaron Edelman, works as a Chief Strategy Officer and comes by occasionally.. Her daughter Theadora Rama, 56 years old as of July 2019, lives in [place] and in addition to working as a Engineer, materials has 7 children, the oldest 81. She works out of her home.  Her husband is Freight forwarder at [employer].    ADVANCED DIRECTIVES:  The patient intends to name her mother Worthy Flank as healthcare power of attorney. She can be reached at [phone]. At the 11/07/2014 visit patient received the appropriate documents 2 complete and notarize at her discretion   HEALTH MAINTENANCE: Social History   Tobacco Use  . Smoking status: Former Smoker    Packs/day: 2.00    Years: 23.00    Pack years: 46.00    Types: Cigarettes    Start date: 09/02/1978    Quit date: 10/26/2001    Years since quitting: 18.6   . Smokeless tobacco: Never Used  . Tobacco comment: Significant passive exposure from boy friend  Vaping Use  . Vaping Use: Never used  Substance Use Topics  . Alcohol use: No    Alcohol/week: 0.0 standard drinks    Comment: history of alcohol abuse 20-30  . Drug use:  No    Comment: +MJ      Colonoscopy: 02/2020 (Dr. Havery Moros), recall 2026  PAP: Status post hysterectomy  Bone density: never  Lipid profile: 06/16/2017, good   No Known Allergies  Current Outpatient Medications  Medication Sig Dispense Refill  . acetaminophen (TYLENOL) 500 MG tablet Take 500 mg by mouth every 8 (eight) hours as needed for moderate pain.    Marland Kitchen albuterol (VENTOLIN HFA) 108 (90 Base) MCG/ACT inhaler INHALE 2 PUFFS INTO THE LUNGS EVERY 4-6 HOURS AS NEEDED FOR WHEEZING AND SHORTNESS OF BREATH 54 g 12  . ALPRAZolam (XANAX) 0.5 MG tablet Take 1 tablet (0.5 mg total) by mouth 2 (two) times daily as needed for up to 1 dose for anxiety. (Patient taking differently: Take 0.5 mg by mouth 2 (two) times daily. ) 40 tablet 0  . atorvastatin (LIPITOR) 40 MG tablet TAKE 1 TABLET(40 MG) BY MOUTH DAILY AT 6 PM 90 tablet 3  . Budeson-Glycopyrrol-Formoterol (BREZTRI AEROSPHERE) 160-9-4.8 MCG/ACT AERO Inhale 2 puffs into the lungs 2 (two) times daily. 32.1 g 3  . carvedilol (COREG) 6.25 MG tablet TAKE 1 TABLET(6.25 MG) BY MOUTH TWICE DAILY WITH A MEAL 180 tablet 3  . clopidogrel (PLAVIX) 75 MG tablet TAKE 1 TABLET(75 MG) BY MOUTH DAILY 90 tablet 3  . ezetimibe (ZETIA) 10 MG tablet Take 10 mg by mouth daily.    . fluticasone furoate-vilanterol (BREO ELLIPTA) 100-25 MCG/INH AEPB INHALE 1 PUFF BY MOUTH INTO THE LUNGS DAILY (Patient taking differently: Inhale 1 puff into the lungs daily. INHALE 1 PUFF BY MOUTH INTO THE LUNGS DAILY) 240 each 4  . furosemide (LASIX) 20 MG tablet TAKE 1 TABLET(20 MG) BY MOUTH DAILY 90 tablet 3  . ipratropium-albuterol (DUONEB) 0.5-2.5 (3) MG/3ML SOLN USE ONE VIAL VIA NEBULIZER EVERY 6 HOURS AS  NEEDED 90 mL 3  . losartan (COZAAR) 25 MG tablet Take 1 tablet (25 mg total) by mouth daily. 90 tablet 3  . nitroGLYCERIN (NITROSTAT) 0.4 MG SL tablet Place 1 tablet (0.4 mg total) under the tongue every 5 (five) minutes as needed for chest pain. 30 tablet 12  . OXYGEN Inhale 1 L/min into the lungs as needed.    . pantoprazole (PROTONIX) 40 MG tablet Take 40 mg by mouth daily.     . potassium chloride (KLOR-CON) 10 MEQ tablet Take 1 tablet (10 mEq total) by mouth daily. 90 tablet 4  . spironolactone (ALDACTONE) 25 MG tablet TAKE 1 TABLET(25 MG) BY MOUTH DAILY 90 tablet 2  . zolpidem (AMBIEN) 5 MG tablet TAKE 1 TABLET BY MOUTH AT BEDTIME AS NEEDED FOR SLEEP 30 tablet 5   No current facility-administered medications for this visit.    OBJECTIVE: White woman who appears stated age  Vitals:   06/26/20 0927  BP: 116/81  Pulse: 90  Resp: 18  Temp: 98.3 F (36.8 C)  SpO2: 91%     Body mass index is 29.97 kg/m.    ECOG FS:1 - Symptomatic but completely ambulatory  Sclerae unicteric, EOMs intact Wearing a mask No cervical or supraclavicular adenopathy Lungs no rales or rhonchi, mild cough provoked by deep inspiration Heart regular rate and rhythm Abd soft, nontender, positive bowel sounds MSK no focal spinal tenderness, no upper extremity lymphedema Neuro: nonfocal, well oriented, appropriate affect Breasts: I do not palpate any suspicious masses and there are no skin or nipple changes of concern.  Both axillae are benign.   LAB RESULTS:  CMP     Component Value Date/Time  NA 141 12/20/2019 1111   NA 140 06/20/2017 1234   K 4.3 12/20/2019 1111   K 3.6 06/20/2017 1234   CL 103 12/20/2019 1111   CL 111 (H) 07/09/2012 0958   CO2 22 12/20/2019 1111   CO2 26 06/20/2017 1234   GLUCOSE 82 12/20/2019 1111   GLUCOSE 84 06/24/2019 1055   GLUCOSE 91 06/20/2017 1234   GLUCOSE 80 07/09/2012 0958   BUN 12 12/20/2019 1111   BUN 9.8 06/20/2017 1234   CREATININE 0.74 12/20/2019 1111    CREATININE 0.83 06/24/2019 1055   CREATININE 0.8 06/20/2017 1234   CALCIUM 9.5 12/20/2019 1111   CALCIUM 9.4 06/20/2017 1234   PROT 7.3 12/20/2019 1111   PROT 8.0 06/20/2017 1234   ALBUMIN 4.7 12/20/2019 1111   ALBUMIN 4.2 06/20/2017 1234   AST 14 12/20/2019 1111   AST 11 (L) 06/24/2019 1055   AST 15 06/20/2017 1234   ALT 8 12/20/2019 1111   ALT 9 06/24/2019 1055   ALT 9 06/20/2017 1234   ALKPHOS 133 (H) 12/20/2019 1111   ALKPHOS 95 06/20/2017 1234   BILITOT 0.8 12/20/2019 1111   BILITOT 0.8 06/24/2019 1055   BILITOT 1.04 06/20/2017 1234   GFRNONAA 91 12/20/2019 1111   GFRNONAA >60 06/24/2019 1055   GFRAA 105 12/20/2019 1111   GFRAA >60 06/24/2019 1055    INo results found for: SPEP, UPEP  Lab Results  Component Value Date   WBC 7.8 06/26/2020   NEUTROABS 5.9 06/26/2020   HGB 12.9 06/26/2020   HCT 37.4 06/26/2020   MCV 84.0 06/26/2020   PLT 224 06/26/2020      Chemistry      Component Value Date/Time   NA 141 12/20/2019 1111   NA 140 06/20/2017 1234   K 4.3 12/20/2019 1111   K 3.6 06/20/2017 1234   CL 103 12/20/2019 1111   CL 111 (H) 07/09/2012 0958   CO2 22 12/20/2019 1111   CO2 26 06/20/2017 1234   BUN 12 12/20/2019 1111   BUN 9.8 06/20/2017 1234   CREATININE 0.74 12/20/2019 1111   CREATININE 0.83 06/24/2019 1055   CREATININE 0.8 06/20/2017 1234      Component Value Date/Time   CALCIUM 9.5 12/20/2019 1111   CALCIUM 9.4 06/20/2017 1234   ALKPHOS 133 (H) 12/20/2019 1111   ALKPHOS 95 06/20/2017 1234   AST 14 12/20/2019 1111   AST 11 (L) 06/24/2019 1055   AST 15 06/20/2017 1234   ALT 8 12/20/2019 1111   ALT 9 06/24/2019 1055   ALT 9 06/20/2017 1234   BILITOT 0.8 12/20/2019 1111   BILITOT 0.8 06/24/2019 1055   BILITOT 1.04 06/20/2017 1234       Lab Results  Component Value Date   LABCA2 26 07/21/2007    No components found for: [id number]  No results for input(s): INR in the last 168 hours.  Urinalysis    Component Value Date/Time    LABSPEC 1.005 07/16/2012 1207   PHURINE 6.0 07/16/2012 1207   HGBUR Negative 07/16/2012 1207   BILIRUBINUR Negative 07/16/2012 1207   KETONESUR Negative 07/16/2012 1207   PROTEINUR Negative 07/16/2012 1207   NITRITE Negative 07/16/2012 1207   LEUKOCYTESUR Trace 07/16/2012 1207    STUDIES: No results found.   ASSESSMENT: 54 y.o. [place] woman  A: LUNG CANCER  (1) status post right upper lobe wedge resection of a T1 N2, stage IIIA non-small cell lung cancer (adenocarcinoma) measuring 1.8 cm, poorly differentiated, with negative margins, but with 4 positive  lymph nodes, 3 N1 and 1 N2;   (a) on I[place]a 2004 to 2009  (b) no evidence of disease recurrence on most recent scans, and likely cured  (2) ashthmatic bronchitis/ COPD  (3) tobacco abuse: The patient quit smoking 2001  B: MALIGNANT MELANOMA  (4) s/p punch biopsy Right posterior calf for malignant melanoma 0.33 mm thickness, level III, w/o ulceration and with a low mitotic index, free deep but positive lateral margins  (5) status post wide excision 09/13/2014 showing a maximum tumor thickness of 1.15 mm, and anatomic level III, with ulceration noted only at the site of prior biopsy, with clear margins (1 cm per surgical note) with a low mitotic index and brisk infiltration by tumor infiltrating lymphocytes: pT2a NX MX = stage IB  (6) right inguinal sentinel lymph node biopsy 11/29/2014 showed no evidence of melanoma  C: COLON CANCER ([facility] following) (7) colonoscopy 11/29/2014 shows adenocarcinoma in ascending colon polyp; repeat colonoscopy 12/30/2014 removed a tubular adenoma in that area which was negative for malignancy. The area was marked for possible definitive surgery  (a) Repeat colonoscopy with polypectomy 12/30/2014 showed tubular adenomas.  (b) repeat colonoscopy 11/29/2015 showed an additional 4-5 adenomatous, which were removed.  Repeat colonoscopy was recommended in 1 year  (c) colonoscopy 01/10/2017 showed  no new polyps.    (d) colonoscopy 02/07/2020 (Armbruster) showed a tubular adenoma:  (e) colonoscopy 2026  D: GENETICS (8) Genetic testing 07/23/2016 through the Long Panel (with MSH2 Exons 1-7 Inversion Analysis) found no deleterious mutations.in APC, ATM, AXIN2, BAP1, BMPR1A, BRCA1, BRCA2, CDH1, CDK4, CDKN2A, CHEK2, EPCAM, MITF, MLH1, MSH2, MSH6, MUTYH, PMS2, POLD1, POLE, PTEN, SCG5/GREM1, SMAD4, STK11, and TP53.   (a) One variant of uncertain significance (VUS) called "c.7187C>G (p.Thr2396Ser)" was found in one copy of the ATM gene  E: Coronary artery disease status post stenting September 2018    PLAN: Anjannette is now 17 years out from her lung cancer with no evidence of disease recurrence.  She is very limited in her lung capacity and activity level as a result of her prior smoking but she has not smoked in more than 20 years.  She has good pulmonary follow-up  She is 5-1/2 years out from her diagnosis of colon cancer, with a colonoscopy this year showing only a tubular adenoma.  She will have her next colonoscopy in 5years.    She is also about 6 years out from her melanoma diagnosis.  She has regular follow-up with dermatology.  She will have repeat mammography in January.  She will see me again in 1 year.  She knows to call for any other issue that may develop before her next visit  Total encounter time 25 minutes.*   Orren Pietsch, Virgie Dad, MD  06/26/20 9:54 AM Medical Oncology and Hematology [facility] [place], [place] [address] Tel. [phone]    Fax. [phone]    I, Wilburn Mylar am acting as scribe for Dr. Virgie Dad. Sabastion Hrdlicka.  I, Lurline Del MD, have reviewed the above documentation for accuracy and completeness, and I agree with the above.   *Total Encounter Time as defined by the Centers for Medicare and Medicaid Services includes, in addition to the face-to-face time of a patient visit (documented in the note  above) non-face-to-face time: obtaining and reviewing outside history, ordering and reviewing medications, tests or procedures, care coordination (communications with other health care professionals or caregivers) and documentation in the medical record.

## 2020-06-26 ENCOUNTER — Inpatient Hospital Stay: Payer: Medicare Other

## 2020-06-26 ENCOUNTER — Inpatient Hospital Stay: Payer: Medicare Other | Attending: Oncology | Admitting: Oncology

## 2020-06-26 ENCOUNTER — Other Ambulatory Visit: Payer: Self-pay

## 2020-06-26 VITALS — BP 116/81 | HR 90 | Temp 98.3°F | Resp 18 | Ht 65.0 in | Wt 180.1 lb

## 2020-06-26 DIAGNOSIS — Z87891 Personal history of nicotine dependence: Secondary | ICD-10-CM | POA: Insufficient documentation

## 2020-06-26 DIAGNOSIS — Z85118 Personal history of other malignant neoplasm of bronchus and lung: Secondary | ICD-10-CM | POA: Diagnosis not present

## 2020-06-26 DIAGNOSIS — Z833 Family history of diabetes mellitus: Secondary | ICD-10-CM | POA: Insufficient documentation

## 2020-06-26 DIAGNOSIS — C3491 Malignant neoplasm of unspecified part of right bronchus or lung: Secondary | ICD-10-CM | POA: Diagnosis not present

## 2020-06-26 DIAGNOSIS — Z955 Presence of coronary angioplasty implant and graft: Secondary | ICD-10-CM

## 2020-06-26 DIAGNOSIS — Z9221 Personal history of antineoplastic chemotherapy: Secondary | ICD-10-CM | POA: Insufficient documentation

## 2020-06-26 DIAGNOSIS — Z801 Family history of malignant neoplasm of trachea, bronchus and lung: Secondary | ICD-10-CM | POA: Diagnosis not present

## 2020-06-26 DIAGNOSIS — Z803 Family history of malignant neoplasm of breast: Secondary | ICD-10-CM | POA: Insufficient documentation

## 2020-06-26 DIAGNOSIS — Z79899 Other long term (current) drug therapy: Secondary | ICD-10-CM | POA: Insufficient documentation

## 2020-06-26 DIAGNOSIS — J449 Chronic obstructive pulmonary disease, unspecified: Secondary | ICD-10-CM | POA: Diagnosis not present

## 2020-06-26 DIAGNOSIS — Z90710 Acquired absence of both cervix and uterus: Secondary | ICD-10-CM | POA: Diagnosis not present

## 2020-06-26 DIAGNOSIS — C4371 Malignant melanoma of right lower limb, including hip: Secondary | ICD-10-CM

## 2020-06-26 DIAGNOSIS — Z902 Acquired absence of lung [part of]: Secondary | ICD-10-CM | POA: Diagnosis not present

## 2020-06-26 DIAGNOSIS — Z7902 Long term (current) use of antithrombotics/antiplatelets: Secondary | ICD-10-CM | POA: Diagnosis not present

## 2020-06-26 DIAGNOSIS — Z85820 Personal history of malignant melanoma of skin: Secondary | ICD-10-CM | POA: Insufficient documentation

## 2020-06-26 DIAGNOSIS — Z85038 Personal history of other malignant neoplasm of large intestine: Secondary | ICD-10-CM | POA: Diagnosis present

## 2020-06-26 DIAGNOSIS — C189 Malignant neoplasm of colon, unspecified: Secondary | ICD-10-CM | POA: Diagnosis not present

## 2020-06-26 LAB — CBC WITH DIFFERENTIAL (CANCER CENTER ONLY)
Abs Immature Granulocytes: 0.02 10*3/uL (ref 0.00–0.07)
Basophils Absolute: 0.1 10*3/uL (ref 0.0–0.1)
Basophils Relative: 1 %
Eosinophils Absolute: 0.1 10*3/uL (ref 0.0–0.5)
Eosinophils Relative: 2 %
HCT: 37.4 % (ref 36.0–46.0)
Hemoglobin: 12.9 g/dL (ref 12.0–15.0)
Immature Granulocytes: 0 %
Lymphocytes Relative: 13 %
Lymphs Abs: 1.1 10*3/uL (ref 0.7–4.0)
MCH: 29 pg (ref 26.0–34.0)
MCHC: 34.5 g/dL (ref 30.0–36.0)
MCV: 84 fL (ref 80.0–100.0)
Monocytes Absolute: 0.6 10*3/uL (ref 0.1–1.0)
Monocytes Relative: 8 %
Neutro Abs: 5.9 10*3/uL (ref 1.7–7.7)
Neutrophils Relative %: 76 %
Platelet Count: 224 10*3/uL (ref 150–400)
RBC: 4.45 MIL/uL (ref 3.87–5.11)
RDW: 13.2 % (ref 11.5–15.5)
WBC Count: 7.8 10*3/uL (ref 4.0–10.5)
nRBC: 0 % (ref 0.0–0.2)

## 2020-06-26 LAB — CMP (CANCER CENTER ONLY)
ALT: 11 U/L (ref 0–44)
AST: 12 U/L — ABNORMAL LOW (ref 15–41)
Albumin: 4 g/dL (ref 3.5–5.0)
Alkaline Phosphatase: 121 U/L (ref 38–126)
Anion gap: 7 (ref 5–15)
BUN: 16 mg/dL (ref 6–20)
CO2: 26 mmol/L (ref 22–32)
Calcium: 9.4 mg/dL (ref 8.9–10.3)
Chloride: 108 mmol/L (ref 98–111)
Creatinine: 0.93 mg/dL (ref 0.44–1.00)
GFR, Estimated: >60 mL/min (ref >60)
Glucose, Bld: 93 mg/dL (ref 70–99)
Potassium: 4 mmol/L (ref 3.5–5.1)
Sodium: 141 mmol/L (ref 135–145)
Total Bilirubin: 0.8 mg/dL (ref 0.3–1.2)
Total Protein: 7.4 g/dL (ref 6.5–8.1)

## 2020-07-02 ENCOUNTER — Other Ambulatory Visit: Payer: Self-pay | Admitting: Cardiovascular Disease

## 2020-08-31 ENCOUNTER — Other Ambulatory Visit: Payer: Self-pay | Admitting: Internal Medicine

## 2020-09-29 ENCOUNTER — Telehealth: Payer: Self-pay | Admitting: Internal Medicine

## 2020-09-29 MED ORDER — ZOLPIDEM TARTRATE 5 MG PO TABS: ORAL_TABLET | ORAL | 5 refills | Status: DC

## 2020-09-29 NOTE — Telephone Encounter (Signed)
Patient called requesting refill   Medication name/strength/dose: ambien 5mg  tablet Medication last rx'd: 02/17/2020 Quantity and number of refills last rx'd: #30 5 refills Instructions: take one tablet at bedtime as needed for sleep  Last OV: 04/13/2020 Next OV: 10/16/20  CY please advise on refill request  No Known Allergies Current Outpatient Medications on File Prior to Visit  Medication Sig Dispense Refill  . acetaminophen (TYLENOL) 500 MG tablet Take 500 mg by mouth every 8 (eight) hours as needed for moderate pain.    Marland Kitchen albuterol (VENTOLIN HFA) 108 (90 Base) MCG/ACT inhaler INHALE 2 PUFFS INTO THE LUNGS EVERY 4-6 HOURS AS NEEDED FOR WHEEZING AND SHORTNESS OF BREATH 54 g 12  . ALPRAZolam (XANAX) 0.5 MG tablet Take 1 tablet (0.5 mg total) by mouth 2 (two) times daily as needed for up to 1 dose for anxiety. (Patient taking differently: Take 0.5 mg by mouth 2 (two) times daily. ) 40 tablet 0  . atorvastatin (LIPITOR) 40 MG tablet TAKE 1 TABLET(40 MG) BY MOUTH DAILY AT 6 PM 90 tablet 3  . Budeson-Glycopyrrol-Formoterol (BREZTRI AEROSPHERE) 160-9-4.8 MCG/ACT AERO Inhale 2 puffs into the lungs 2 (two) times daily. 32.1 g 3  . carvedilol (COREG) 6.25 MG tablet TAKE 1 TABLET(6.25 MG) BY MOUTH TWICE DAILY WITH A MEAL 180 tablet 3  . clopidogrel (PLAVIX) 75 MG tablet TAKE 1 TABLET(75 MG) BY MOUTH DAILY 90 tablet 3  . ezetimibe (ZETIA) 10 MG tablet Take 10 mg by mouth daily.    . fluticasone furoate-vilanterol (BREO ELLIPTA) 100-25 MCG/INH AEPB INHALE 1 PUFF BY MOUTH INTO THE LUNGS DAILY (Patient taking differently: Inhale 1 puff into the lungs daily. INHALE 1 PUFF BY MOUTH INTO THE LUNGS DAILY) 240 each 4  . furosemide (LASIX) 20 MG tablet TAKE 1 TABLET(20 MG) BY MOUTH DAILY 90 tablet 3  . ipratropium-albuterol (DUONEB) 0.5-2.5 (3) MG/3ML SOLN USE ONE VIAL VIA NEBULIZER EVERY 6 HOURS AS NEEDED 90 mL 3  . losartan (COZAAR) 25 MG tablet TAKE 1 TABLET(25 MG) BY MOUTH DAILY 90 tablet 2  .  nitroGLYCERIN (NITROSTAT) 0.4 MG SL tablet Place 1 tablet (0.4 mg total) under the tongue every 5 (five) minutes as needed for chest pain. 30 tablet 12  . OXYGEN Inhale 1 L/min into the lungs as needed.    . pantoprazole (PROTONIX) 40 MG tablet Take 40 mg by mouth daily.     . potassium chloride (KLOR-CON) 10 MEQ tablet Take 1 tablet (10 mEq total) by mouth daily. 90 tablet 4  . spironolactone (ALDACTONE) 25 MG tablet TAKE 1 TABLET(25 MG) BY MOUTH DAILY 90 tablet 2  . zolpidem (AMBIEN) 5 MG tablet TAKE 1 TABLET BY MOUTH AT BEDTIME AS NEEDED FOR SLEEP 30 tablet 5   No current facility-administered medications on file prior to visit.

## 2020-09-29 NOTE — Telephone Encounter (Signed)
Ambien refilled

## 2020-10-03 ENCOUNTER — Other Ambulatory Visit: Payer: Self-pay | Admitting: Internal Medicine

## 2020-10-03 MED ORDER — ZOLPIDEM TARTRATE 5 MG PO TABS: ORAL_TABLET | ORAL | 5 refills | Status: DC

## 2020-10-03 NOTE — Progress Notes (Signed)
Zolpidem refill e-sent

## 2020-10-15 NOTE — Progress Notes (Signed)
Patient ID: [name], female    DOB: 19-Dec-1965, 55 y.o.   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 Walk test on room air 06/28/2016-95%, 95%, 95%, 93%, peak heart rate 120/minute. No desaturation after 3185 feet. Office Spirometry 06/28/2016-limited validity due to cough. Restriction of exhaled volume. FVC 1.79/50%, FEV1 1.64/57%, ratio 0.80. FENO- 5 Walk Test on room Air- Qualified for home O2 04/10/17 PFT 06/17/17- severe obstruction, mild restriction, diffusion severely reduced. Insignificant response to bronchodilator. FVC 1.73/47%, FEV1 1.10/38%, ratio 0.64, TLC 77%, DLCO 32% CHF- acute hosp 05/2017- CHF, CAD, EF 20-25%. Stent. 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 "weather" She wanted reassurance no  pneumonia.  Mucinex some help. Comfortable with O2 at night.  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 Patient diarrhea, headaches, productive cough with green sputum, increased shortness of breath but denies wheezing. Has had 2 covid tests that were negative Acute illness x 3 days Arrival BP 80/60 both arms, HR 83  Reports temp 103 2 days ago. Denies UTI. Likes Breztri.  Review of Systems-+ = positive Constitutional:   No-   weight loss, night sweats, fevers, chills, fatigue, lassitude. HEENT:   No-  headaches, difficulty swallowing, tooth/dental problems, sore throat,      mild sneezing,no- itching, ear ache, nasal congestion, post nasal drip,  CV:  No-   chest pain, orthopnea, PND, swelling in lower extremities, anasarca, dizziness, palpitations Resp: + shortness of breath with exertion or at rest.              +productive cough, + non-productive cough,  No- coughing up of blood.               change in color of mucus. + wheezing.   Skin: No-   rash or lesions. GI:  +HPI GU:   MS:  No-   joint pain or swelling.   Neuro-     nothing unusual Psych:  No- change in mood or affect. No depression or anxiety.  No memory loss.   Objective:   Physical Exam    Has POC General- Alert,  Oriented, Affect-appropriate, Distress - NAD, + overweight   Skin- rash-none, lesions- none, excoriation- none.  Lymphadenopathy- none Head- atraumatic            Eyes-  Gross vision intact, PERRLA, conjunctivae clear secretions            Ears- Hearing, canals-normal            Nose- , no-Septal dev, mucus, polyps, erosion, perforation             Throat- Mallampati IVI , mucosa clear , drainage- none, tonsils- atrophic,  Neck- flexible , trachea midline, no stridor , thyroid nl, carotid no bruit Chest - symmetrical excursion , unlabored           Heart/CV- RRR rapid , no murmur , no gallop  , no rub, nl s1 s2                           - JVD none , edema- none, stasis changes- none, varices- none           Lung-   Raspy LUL+, Wheeze-none, cough-None, dullness-none, rub- none. Rales- none           Chest wall-  Abd-  Br/ Gen/ Rectal- Not done, not indicated Extrem- cyanosis- none, clubbing, none, atrophy- none, strength- nl,    Neuro- grossly intact to observation

## 2020-10-16 ENCOUNTER — Other Ambulatory Visit: Payer: Self-pay

## 2020-10-16 ENCOUNTER — Ambulatory Visit (INDEPENDENT_AMBULATORY_CARE_PROVIDER_SITE_OTHER): Payer: Medicare Other | Admitting: Internal Medicine

## 2020-10-16 ENCOUNTER — Ambulatory Visit (INDEPENDENT_AMBULATORY_CARE_PROVIDER_SITE_OTHER): Payer: Medicare Other

## 2020-10-16 ENCOUNTER — Encounter: Payer: Self-pay | Admitting: Internal Medicine

## 2020-10-16 VITALS — BP 80/60 | HR 83 | Temp 98.1°F | Ht 65.0 in | Wt 182.0 lb

## 2020-10-16 DIAGNOSIS — J9611 Chronic respiratory failure with hypoxia: Secondary | ICD-10-CM

## 2020-10-16 DIAGNOSIS — J441 Chronic obstructive pulmonary disease with (acute) exacerbation: Secondary | ICD-10-CM

## 2020-10-16 DIAGNOSIS — J449 Chronic obstructive pulmonary disease, unspecified: Secondary | ICD-10-CM

## 2020-10-16 LAB — CBC WITH DIFFERENTIAL/PLATELET
Basophils Absolute: 0.1 10*3/uL (ref 0.0–0.1)
Basophils Relative: 0.6 % (ref 0.0–3.0)
Eosinophils Absolute: 0.2 10*3/uL (ref 0.0–0.7)
Eosinophils Relative: 2.7 % (ref 0.0–5.0)
HCT: 41.6 % (ref 36.0–46.0)
Hemoglobin: 14.5 g/dL (ref 12.0–15.0)
Lymphocytes Relative: 11.5 % — ABNORMAL LOW (ref 12.0–46.0)
Lymphs Abs: 0.9 10*3/uL (ref 0.7–4.0)
MCHC: 34.8 g/dL (ref 30.0–36.0)
MCV: 85 fl (ref 78.0–100.0)
Monocytes Absolute: 0.7 10*3/uL (ref 0.1–1.0)
Monocytes Relative: 8.9 % (ref 3.0–12.0)
Neutro Abs: 6.2 10*3/uL (ref 1.4–7.7)
Neutrophils Relative %: 76.3 % (ref 43.0–77.0)
Platelets: 231 10*3/uL (ref 150.0–400.0)
RBC: 4.9 Mil/uL (ref 3.87–5.11)
RDW: 13.8 % (ref 11.5–15.5)
WBC: 8.1 10*3/uL (ref 4.0–10.5)

## 2020-10-16 LAB — BASIC METABOLIC PANEL
BUN: 14 mg/dL (ref 6–23)
CO2: 24 mEq/L (ref 19–32)
Calcium: 9.9 mg/dL (ref 8.4–10.5)
Chloride: 103 mEq/L (ref 96–112)
Creatinine, Ser: 1.01 mg/dL (ref 0.40–1.20)
GFR: 62.31 mL/min (ref >60.00)
Glucose, Bld: 90 mg/dL (ref 70–99)
Potassium: 4.3 mEq/L (ref 3.5–5.1)
Sodium: 136 mEq/L (ref 135–145)

## 2020-10-16 NOTE — Patient Instructions (Signed)
Order- CXR    Dx exacerbation COPD             Lab-     CBC w diff, BMET   Recommend Pepto Bismol or Kaopectate for the diarrhea  Recommend you increase your fluid intake with Gatorade or GingerAle to help get your blood pressure back up- you are dehydrated.  Please call as needed

## 2020-10-27 ENCOUNTER — Other Ambulatory Visit: Payer: Self-pay | Admitting: Oncology

## 2020-10-27 DIAGNOSIS — Z1231 Encounter for screening mammogram for malignant neoplasm of breast: Secondary | ICD-10-CM

## 2020-10-30 ENCOUNTER — Ambulatory Visit
Admission: RE | Admit: 2020-10-30 | Discharge: 2020-10-30 | Disposition: A | Discharge status: Home / Self Care | Payer: Medicare Other | Source: Ambulatory Visit | Attending: Oncology | Admitting: Oncology

## 2020-10-30 ENCOUNTER — Other Ambulatory Visit: Payer: Self-pay

## 2020-10-30 DIAGNOSIS — Z1231 Encounter for screening mammogram for malignant neoplasm of breast: Secondary | ICD-10-CM

## 2020-12-19 NOTE — Progress Notes (Signed)
Cardiology Office Note:    Date:  12/20/2020   ID:  [name], DOB [birth year], MRN [id number]  PCP:  Leanna Battles, MD  Cardiologist:  Skeet Latch, MD   Referring MD: Leanna Battles, MD   Chief Complaint  Patient presents with  . Follow-up    CAD    History of Present Illness:    [name] is a 55 y.o. female with a hx of CAD with PCI to left main, chronic systolic and diastolic heart failure with EF 20 to 25% improved to 50%, severe COPD, lung cancer in remission (chemo, radiation, lobectomy), colon cancer s/p resection, and melanoma. She was hospitalized Aug 2018 with acute systolic heart failure found to have an EF of 20 to 25% with inferior, inferior septal akinesis.  RV had decreased systolic function.  Left heart cath revealed a 70% ostial left main disease treated with DES.  Repeat echo December 2018 showed EF 50 to 55%, moderate AR.  She was previously followed in the heart failure clinic with Dr. Aundra Dubin.  She was last seen by Dr. Oval Linsey in April 2021 and at the time struggled with using her portable oxygen.   She presents for routine annual follow-up.  She thinks she has pneumonia. She has been battling what sounds like allergies and attributes her symptoms of congestion to pollen. She has a productive cough of greenish-yellow sputum. Symptoms started about 1 week ago. She states that she has trouble with ABX given her multiple pulmonary issues. She states she had a CXR in Feb with pulmonology with croupy cough, but was not given an ABX.   She reports heart pounding with activity associated with shortness of breath. Dyspnea is her baseline, heart pounding seems worsened. She also reports weight gain, but not from fluid. She denies lower extremity swelling. She tries to avoid salt, but notes this is difficult. She is unable to walk or exercise due to lung disease. We had a discussion regarding high sugar foods and intermittent fasting. I suspect heart  pounding is related to obesity, pulmonary disease, and deconditioning.    Past Medical History:  Diagnosis Date  . Acute bilateral low back pain without sciatica   . Acute congestive heart failure ([place]) 05/02/2017   EF 20-25% on ECHO  . Acute respiratory failure with hypoxia ([place]) 05/01/2017  . 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 Allergic rhinitis   . Allergy   . Anxiety disorder   . Aortic valve regurgitation 05/15/2017  . Asthma   . Chronic bronchitis   . Chronic diastolic heart failure ([place]) 12/22/2019  . Colon cancer ([place])    adenocarcinoma in a polyps 11-24-2014  . COPD (chronic obstructive pulmonary disease) ([place])   . Cough   . Dizziness and giddiness   . Dyspepsia   . Dyspnea 04/30/2017  . Emphysema of lung ([place])   . External hemorrhoids   . EXTERNAL HEMORRHOIDS 09/27/2009  . Female stress incontinence   . Genetic testing 08/18/2016   Negative for known pathogenic mutations within any of 25 genes on a Custom Panel through [employer].  One variant of uncertain significance (VUS) called "c.7187C>G (p.Thr2396Ser)" was found in one copy of the ATM gene.  This Custom Cancer Panel offered by [employer] includes sequencing and/or duplication/deletion testing of the following 25 genes: APC, ATM, AXIN2, BAP1, BMPR1A, BRCA1, BRCA2, CDH1, CDK4, CDKN2A, CHEK2, EPCAM, MITF, MLH1, MSH2, MSH6, MUTYH, PMS2, POLD1, POLE, PTEN, SCG5/GREM1, SMAD4, STK11, and TP53. Date of report is July 23, 2016.  MSH2  Exons 1-7 Inversion Analysis was also negative through [employer].  Date of report is July 23, 2016.     Marland Kitchen GERD (gastroesophageal reflux disease)    occ depending on diet   . Goiter   . History of lung cancer   . Hypercholesteremia    denies-last check normal labs  . Hypokalemia   . 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 Insomnia   . Lung cancer ([place])   . Migraine, unspecified, without mention of intractable migraine without mention of status migrainosus    . Panic attacks   . Seizures ([place])    very long ago like 15 years ago or so , questionable etilogy  . Skin cancer (melanoma) ([place])    right calf  . Snoring 12/11/2010   Longstanding hx of snore. NPSG was normal years ago.    Marland Kitchen Swelling of right lower extremity 12/01/2014    Past Surgical History:  Procedure Laterality Date  . ABDOMINAL HYSTERECTOMY  2001   no BSO  . biopsy of right neck mass     negative-thyroid biopsy   . BREAST EXCISIONAL BIOPSY Right 1987   no visible scar  . BRONCHOSCOPY  01/31/2005   nonspecific inflammation  . BRONCHOSCOPY  08/24/2010   nonspecific inflammation  . COLONOSCOPY    . COLONOSCOPY WITH PROPOFOL N/A 02/07/2020   Procedure: COLONOSCOPY WITH PROPOFOL;  Surgeon: Yetta Flock, MD;  Location: [facility];  Service: Gastroenterology;  Laterality: N/A;  . CORONARY STENT INTERVENTION N/A 05/12/2017   Procedure: CORONARY STENT INTERVENTION;  Surgeon: Charolette Forward, MD;  Location: [facility];  Service: Cardiovascular;  Laterality: N/A;  . LUNG REMOVAL, PARTIAL     for lung cancer--followed by Dr. Arlyce Dice and Dr. Annamaria Boots  . MELANOMA EXCISION WITH SENTINEL LYMPH NODE BIOPSY Right 11/29/2014   Procedure: RIGHT INGUINAL SENTINEL LYMPH NODE BIOPSY;  Surgeon: Georganna Skeans, MD;  Location: [facility];  Service: General;  Laterality: Right;  . melanoma removal     right calf   . POLYPECTOMY    . POLYPECTOMY  02/07/2020   Procedure: POLYPECTOMY;  Surgeon: Yetta Flock, MD;  Location: [facility];  Service: Gastroenterology;;  . portacath removed  10/2007  . RIGHT/LEFT HEART CATH AND CORONARY ANGIOGRAPHY N/A 05/06/2017   Procedure: RIGHT/LEFT HEART CATH AND CORONARY ANGIOGRAPHY;  Surgeon: [name], MD;  Location: [facility];  Service: Cardiovascular;  Laterality: N/A;  . SPIROMETRY  07/03/2002   min obstruction,mild restriction 01/31/2005  . tvh  12/01/2000   hysterectomy    Current Medications: Current Meds  Medication Sig  .  acetaminophen (TYLENOL) 500 MG tablet Take 500 mg by mouth every 8 (eight) hours as needed for moderate pain.  Marland Kitchen albuterol (VENTOLIN HFA) 108 (90 Base) MCG/ACT inhaler INHALE 2 PUFFS INTO THE LUNGS EVERY 4-6 HOURS AS NEEDED FOR WHEEZING AND SHORTNESS OF BREATH  . ALPRAZolam (XANAX) 0.5 MG tablet Take 1 tablet (0.5 mg total) by mouth 2 (two) times daily as needed for up to 1 dose for anxiety. (Patient taking differently: Take 0.5 mg by mouth 2 (two) times daily.)  . atorvastatin (LIPITOR) 40 MG tablet TAKE 1 TABLET(40 MG) BY MOUTH DAILY AT 6 PM  . Budeson-Glycopyrrol-Formoterol (BREZTRI AEROSPHERE) 160-9-4.8 MCG/ACT AERO Inhale 2 puffs into the lungs 2 (two) times daily.  . carvedilol (COREG) 6.25 MG tablet TAKE 1 TABLET(6.25 MG) BY MOUTH TWICE DAILY WITH A MEAL  . clopidogrel (PLAVIX) 75 MG tablet TAKE 1 TABLET(75 MG) BY  MOUTH DAILY  . ezetimibe (ZETIA) 10 MG tablet Take 10 mg by mouth daily.  . fluticasone furoate-vilanterol (BREO ELLIPTA) 100-25 MCG/INH AEPB INHALE 1 PUFF BY MOUTH INTO THE LUNGS DAILY (Patient taking differently: Inhale 1 puff into the lungs daily. INHALE 1 PUFF BY MOUTH INTO THE LUNGS DAILY)  . furosemide (LASIX) 20 MG tablet TAKE 1 TABLET(20 MG) BY MOUTH DAILY  . ipratropium-albuterol (DUONEB) 0.5-2.5 (3) MG/3ML SOLN USE ONE VIAL VIA NEBULIZER EVERY 6 HOURS AS NEEDED  . losartan (COZAAR) 25 MG tablet TAKE 1 TABLET(25 MG) BY MOUTH DAILY  . nitroGLYCERIN (NITROSTAT) 0.4 MG SL tablet Place 1 tablet (0.4 mg total) under the tongue every 5 (five) minutes as needed for chest pain.  . OXYGEN Inhale 1 L/min into the lungs as needed.  . pantoprazole (PROTONIX) 40 MG tablet Take 40 mg by mouth daily.   . potassium chloride (KLOR-CON) 10 MEQ tablet Take 1 tablet (10 mEq total) by mouth daily.  Marland Kitchen spironolactone (ALDACTONE) 25 MG tablet TAKE 1 TABLET(25 MG) BY MOUTH DAILY  . zolpidem (AMBIEN) 5 MG tablet TAKE 1 TABLET BY MOUTH AT BEDTIME AS NEEDED FOR SLEEP     Allergies:   Patient has no  known allergies.   Social History   Socioeconomic History  . Marital status: Widowed    Spouse name: Not on file  . Number of children: 1  . Years of education: 62  . Highest education level: Not on file  Occupational History  . Occupation: Disabled- Cytogeneticist: UNEMPLOYED  Tobacco Use  . Smoking status: Former Smoker    Packs/day: 2.00    Years: 23.00    Pack years: 46.00    Types: Cigarettes    Start date: 09/02/1978    Quit date: 10/26/2001    Years since quitting: 19.1  . Smokeless tobacco: Never Used  . Tobacco comment: Significant passive exposure from boy friend  Vaping Use  . Vaping Use: Never used  Substance and Sexual Activity  . Alcohol use: No    Alcohol/week: 0.0 standard drinks    Comment: history of alcohol abuse 20-30  . Drug use: No    Comment: +MJ  . Sexual activity: Not on file  Other Topics Concern  . Not on file  Social History Narrative   Health Care POA:    Emergency Contact:    End of Life Plan:    Who lives with you: self   Any pets: cat, Bart   Diet: Pt has a varied diet but reports not eating much because she is afraid of weight gain.   Exercise: Pt has no regular exercise routine.   Seatbelts: Pt reports wearing seatbelt when in vehicles.    Hobbies: movies         Social Determinants of Radio broadcast assistant Strain: Not on file  Food Insecurity: Not on file  Transportation Needs: Not on file  Physical Activity: Not on file  Stress: Not on file  Social Connections: Not on file     Family History: The patient's family history includes Asthma in her daughter; COPD in her maternal grandmother and mother; Cancer in her paternal grandfather; Cirrhosis in her paternal uncle; Colon polyps in her brother; Diabetes in her maternal grandfather; Lung cancer in her father; Other in her maternal aunt and mother; Throat cancer in an other family member. There is no history of Colon cancer, Rectal cancer, or Stomach cancer.  ROS:    Please see the  history of present illness.     All other systems reviewed and are negative.  EKGs/Labs/Other Studies Reviewed:    The following studies were reviewed today:  Echo 09/16/18: Study Conclusions   - Left ventricle: The cavity size was normal. Wall thickness was  normal. Systolic function was vigorous. The estimated ejection  fraction was in the range of 65% to 70%. Doppler parameters are  consistent with abnormal left ventricular relaxation (grade 1  diastolic dysfunction).   EKG:  EKG is ordered today.  The ekg ordered today demonstrates sinus rhythm with HR 84, nonspecific ST-T wave abnormality - stable from prior tracings  Recent Labs: 06/26/2020: ALT 11 10/16/2020: BUN 14; Creatinine, Ser 1.01; Hemoglobin 14.5; Platelets 231.0; Potassium 4.3; Sodium 136  Recent Lipid Panel    Component Value Date/Time   CHOL 122 12/20/2019 1111   TRIG 129 12/20/2019 1111   HDL 34 (L) 12/20/2019 1111   CHOLHDL 3.6 12/20/2019 1111   CHOLHDL 3.5 07/28/2018 1427   VLDL 19 07/28/2018 1427   LDLCALC 65 12/20/2019 1111   LDLDIRECT 137 (H) 09/27/2009 2057    Physical Exam:    VS:  BP 128/82   Pulse 84   Ht '5\' 5"'  (1.651 m)   Wt 180 lb (81.6 kg)   SpO2 95%   BMI 29.95 kg/m     Wt Readings from Last 3 Encounters:  12/20/20 180 lb (81.6 kg)  10/16/20 182 lb (82.6 kg)  06/26/20 180 lb 1.6 oz (81.7 kg)     GEN:  Well nourished, well developed in no acute distress HEENT: Normal NECK: No JVD; No carotid bruits LYMPHATICS: No lymphadenopathy CARDIAC: RRR, no murmurs, rubs, gallops RESPIRATORY:  Clear to auscultation without rales, wheezing or rhonchi  ABDOMEN: Soft, non-tender, non-distended MUSCULOSKELETAL:  No edema; No deformity  SKIN: Warm and dry NEUROLOGIC:  Alert and oriented x 3 PSYCHIATRIC:  Normal affect   ASSESSMENT:    1. Coronary artery disease involving native coronary artery of native heart without angina pectoris   2. CAD in native artery   3.  Left main coronary artery disease   4. Pure hypercholesterolemia   5. Chronic combined systolic and diastolic heart failure ([place])   6. Congestion of nasal sinus   7. Productive cough   8. COPD mixed type ([place])   9. Morbid obesity ([facility])    PLAN:    In order of problems listed above:  CAD status post DES to LM - She is on lifelong Plavix - Continue carvedilol and atorvastatin, Zetia - she denies chest pain, but does report heart pounding with activity associated with shortness of breath - sounds more consistent with DOE due to lung disease and deconditioning along with weight gain from increased calorie intake - will continue to monitor this for now - doing well on plavix without bleeding   Hyperlipidemia with LDL goal less than 70 - Continue atorvastatin and Zetia - she checks her cholesterol with her PCP - will send [place] labs   Chronic systolic and diastolic heart failure - Continue carvedilol, losartan, and spironolactone - She is doing well on 20 mg Lasix daily - She notes lower extremity swelling with increased salt - we discussed extra lasix with weight gain or edema - she is near euvolemic today   Congestion Productive cough - she has been struggling with allergies and thinks she has PNA - due to complex pulmonary history, I would like for her to see pulmonology for this - we have called their  office and they will call with an apt   COPD Chronic respiratory failure History of lung cancer - Continue  O2 at night - She is no longer a smoker - follows with pulmonlogy   Obesity - she struggles weight gain even though she is only eating once per day - we discussed intermittent fasting and low sugar foods in addition to adequate protein, carb, and fat intake - she is unable to exercise due to underlying pulmonary disease - will refer to weight management clinic - she wants to check her insurance coverage for this   Check thyroid and fasting lipids with PCP at  next visit - she wishes to hold off on a blood draw now.  She wants to follow up with Dr[facility]sey at [facility] - follow up in 1 year.   Medication Adjustments/Labs and Tests Ordered: Current medicines are reviewed at length with the patient today.  Concerns regarding medicines are outlined above.  Orders Placed This Encounter  Procedures  . Amb Ref to Medical Weight Management  . EKG 12-Lead   No orders of the defined types were placed in this encounter.   Signed, Ledora Bottcher, PA  12/20/2020 11:12 AM    [place]

## 2020-12-20 ENCOUNTER — Ambulatory Visit (INDEPENDENT_AMBULATORY_CARE_PROVIDER_SITE_OTHER): Payer: Medicare Other | Admitting: Physician Assistant

## 2020-12-20 ENCOUNTER — Telehealth: Payer: Self-pay | Admitting: Internal Medicine

## 2020-12-20 ENCOUNTER — Encounter: Payer: Self-pay | Admitting: Physician Assistant

## 2020-12-20 ENCOUNTER — Other Ambulatory Visit: Payer: Self-pay

## 2020-12-20 VITALS — BP 128/82 | HR 84 | Ht 65.0 in | Wt 180.0 lb

## 2020-12-20 DIAGNOSIS — I5042 Chronic combined systolic (congestive) and diastolic (congestive) heart failure: Secondary | ICD-10-CM

## 2020-12-20 DIAGNOSIS — R0981 Nasal congestion: Secondary | ICD-10-CM

## 2020-12-20 DIAGNOSIS — R058 Other specified cough: Secondary | ICD-10-CM

## 2020-12-20 DIAGNOSIS — I2510 Atherosclerotic heart disease of native coronary artery without angina pectoris: Secondary | ICD-10-CM

## 2020-12-20 DIAGNOSIS — E7800 Pure hypercholesterolemia, unspecified: Secondary | ICD-10-CM | POA: Diagnosis not present

## 2020-12-20 DIAGNOSIS — J449 Chronic obstructive pulmonary disease, unspecified: Secondary | ICD-10-CM

## 2020-12-20 MED ORDER — AZITHROMYCIN 250 MG PO TABS: 250.0000 mg | ORAL_TABLET | ORAL | 0 refills | Status: DC

## 2020-12-20 NOTE — Telephone Encounter (Signed)
Please offer Zpak 250 mg, # 6, 2 today then one daily

## 2020-12-20 NOTE — Telephone Encounter (Signed)
Spoke with the pt and notified of response per Dr Annamaria Boots and she verbalized understanding. Rx was sent to pharm.

## 2020-12-20 NOTE — Patient Instructions (Signed)
Medication Instructions:  No changes *If you need a refill on your cardiac medications before your next appointment, please call your pharmacy*   Lab Work: Lipid Panel for LDL Less than 70 , TSH to be done with PCP If you have labs (blood work) drawn today and your tests are completely normal, you will receive your results only by: Marland Kitchen MyChart Message (if you have MyChart) OR . A paper copy in the mail If you have any lab test that is abnormal or we need to change your treatment, we will call you to review the results.   Testing/Procedures: No Testing   Follow-Up: At [facility], you and your health needs are our priority.  As part of our continuing mission to provide you with exceptional heart care, we have created designated Provider Care Teams.  These Care Teams include your primary Cardiologist (physician) and Advanced Practice Providers (APPs -  Physician Assistants and Nurse Practitioners) who all work together to provide you with the care you need, when you need it.     Your next appointment:   1 year(s)  The format for your next appointment:   In Person  Provider:   Skeet Latch, MD   Other [facility] [phone]

## 2020-12-20 NOTE — Telephone Encounter (Signed)
Lmtcb for pt.  

## 2020-12-20 NOTE — Telephone Encounter (Signed)
Spoke with the pt  She states started with sinus pressure/nasal congestion about a wk ago  This lasted 2-3 days and then moved down into her chest  She states increased SOB, wheezing and cough with yellow/green sputum  She states that she took at home covid test on 12/16/20 and this was neg  She has had fever off and on, none in the past 3 days  Taking allegra and mucinex since symptoms started  She is still on breztri, albuterol, duoneb  Please advise, thanks  No Known Allergies Current Outpatient Medications on File Prior to Visit  Medication Sig Dispense Refill  . acetaminophen (TYLENOL) 500 MG tablet Take 500 mg by mouth every 8 (eight) hours as needed for moderate pain.    Marland Kitchen albuterol (VENTOLIN HFA) 108 (90 Base) MCG/ACT inhaler INHALE 2 PUFFS INTO THE LUNGS EVERY 4-6 HOURS AS NEEDED FOR WHEEZING AND SHORTNESS OF BREATH 54 g 12  . ALPRAZolam (XANAX) 0.5 MG tablet Take 1 tablet (0.5 mg total) by mouth 2 (two) times daily as needed for up to 1 dose for anxiety. (Patient taking differently: Take 0.5 mg by mouth 2 (two) times daily.) 40 tablet 0  . atorvastatin (LIPITOR) 40 MG tablet TAKE 1 TABLET(40 MG) BY MOUTH DAILY AT 6 PM 90 tablet 3  . Budeson-Glycopyrrol-Formoterol (BREZTRI AEROSPHERE) 160-9-4.8 MCG/ACT AERO Inhale 2 puffs into the lungs 2 (two) times daily. 32.1 g 3  . carvedilol (COREG) 6.25 MG tablet TAKE 1 TABLET(6.25 MG) BY MOUTH TWICE DAILY WITH A MEAL 180 tablet 3  . clopidogrel (PLAVIX) 75 MG tablet TAKE 1 TABLET(75 MG) BY MOUTH DAILY 90 tablet 3  . ezetimibe (ZETIA) 10 MG tablet Take 10 mg by mouth daily.    . furosemide (LASIX) 20 MG tablet TAKE 1 TABLET(20 MG) BY MOUTH DAILY 90 tablet 3  . ipratropium-albuterol (DUONEB) 0.5-2.5 (3) MG/3ML SOLN USE ONE VIAL VIA NEBULIZER EVERY 6 HOURS AS NEEDED 90 mL 3  . losartan (COZAAR) 25 MG tablet TAKE 1 TABLET(25 MG) BY MOUTH DAILY 90 tablet 2  . nitroGLYCERIN (NITROSTAT) 0.4 MG SL tablet Place 1 tablet (0.4 mg total) under the tongue  every 5 (five) minutes as needed for chest pain. 30 tablet 12  . OXYGEN Inhale 1 L/min into the lungs as needed.    . pantoprazole (PROTONIX) 40 MG tablet Take 40 mg by mouth daily.     . potassium chloride (KLOR-CON) 10 MEQ tablet Take 1 tablet (10 mEq total) by mouth daily. 90 tablet 4  . spironolactone (ALDACTONE) 25 MG tablet TAKE 1 TABLET(25 MG) BY MOUTH DAILY 90 tablet 2  . zolpidem (AMBIEN) 5 MG tablet TAKE 1 TABLET BY MOUTH AT BEDTIME AS NEEDED FOR SLEEP 30 tablet 5   No current facility-administered medications on file prior to visit.

## 2021-01-07 NOTE — Assessment & Plan Note (Signed)
Needs to continue O2- sleep and protable

## 2021-01-07 NOTE — Assessment & Plan Note (Signed)
Acute exacerbation with multisystem symptoms Plan- Rehydrate with Gatorade. Try Peptobismol. Continue routine meds.

## 2021-01-12 ENCOUNTER — Telehealth: Payer: Self-pay | Admitting: Cardiovascular Disease

## 2021-01-12 ENCOUNTER — Other Ambulatory Visit: Payer: Self-pay

## 2021-01-12 MED ORDER — ATORVASTATIN CALCIUM 40 MG PO TABS: 40.0000 mg | ORAL_TABLET | Freq: Every day | ORAL | 3 refills | Status: DC

## 2021-01-12 MED ORDER — CLOPIDOGREL BISULFATE 75 MG PO TABS: ORAL_TABLET | ORAL | 3 refills | Status: DC

## 2021-01-12 MED ORDER — LOSARTAN POTASSIUM 25 MG PO TABS: 25.0000 mg | ORAL_TABLET | Freq: Every day | ORAL | 2 refills | Status: DC

## 2021-01-12 MED ORDER — SPIRONOLACTONE 25 MG PO TABS: ORAL_TABLET | ORAL | 3 refills | Status: DC

## 2021-01-12 MED ORDER — FUROSEMIDE 20 MG PO TABS: 20.0000 mg | ORAL_TABLET | Freq: Every day | ORAL | 3 refills | Status: DC

## 2021-01-12 NOTE — Telephone Encounter (Signed)
 *  STAT* If patient is at the pharmacy, call can be transferred to refill team.   1. Which medications need to be refilled? (please list name of each medication and dose if known)   atorvastatin (LIPITOR) 40 MG tablet carvedilol (COREG) 6.25 MG tablet clopidogrel (PLAVIX) 75 MG tablet furosemide (LASIX) 20 MG tablet losartan (COZAAR) 25 MG tablet spironolactone (ALDACTONE) 25 MG tablet    2. Which pharmacy/location (including street and city if local pharmacy) is medication to be sent to?  [place], [place] - [address] AT [place]  3. Do they need a 30 day or 90 day supply? 90 day

## 2021-01-12 NOTE — Telephone Encounter (Signed)
Medication sent.

## 2021-02-01 ENCOUNTER — Other Ambulatory Visit: Payer: Self-pay | Admitting: Cardiovascular Disease

## 2021-02-26 ENCOUNTER — Telehealth: Payer: Self-pay | Admitting: Internal Medicine

## 2021-02-26 NOTE — Telephone Encounter (Signed)
Please schedule for video visit or phone visit if unable to do video.

## 2021-02-26 NOTE — Telephone Encounter (Signed)
Spoke with [name] who stated she has a OV scheduled with PCP at 2:30 which will also include CXR, so [name] does not want to scheduled with Dr. Loanne Drilling. Nothing further needed at this time.

## 2021-02-26 NOTE — Telephone Encounter (Signed)
Primary Pulmonologist: Dr. Annamaria Boots   Last office visit and with whom: 10/16/2020 What do we see them for (pulmonary problems): COPD, respiratory failure with hypoxia Last OV assessment/plan:    Assessment & Plan Note by Deneise Lever, MD at 01/07/2021 9:13 PM  Author: Deneise Lever, MD Author Type: Physician Filed: 01/07/2021  9:14 PM  Note Status: Written Cosign: Cosign Not Required Encounter Date: 10/16/2020  Problem: Chronic respiratory failure with hypoxia ([place])  Editor: Deneise Lever, MD (Physician)               Needs to continue O2- sleep and protable         Assessment & Plan Note by Deneise Lever, MD at 01/07/2021 9:11 PM  Author: Deneise Lever, MD Author Type: Physician Filed: 01/07/2021  9:12 PM  Note Status: Written Cosign: Cosign Not Required Encounter Date: 10/16/2020  Problem: COPD mixed type [facility])  Editor: Deneise Lever, MD (Physician)               Acute exacerbation with multisystem symptoms Plan- Rehydrate with Gatorade. Try Peptobismol. Continue routine meds.         Patient Instructions by Deneise Lever, MD at 10/16/2020 10:30 AM  Author: Deneise Lever, MD Author Type: Physician Filed: 10/16/2020 10:55 AM  Note Status: Signed Cosign: Cosign Not Required Encounter Date: 10/16/2020  Editor: Deneise Lever, MD (Physician)               Order- CXR    Dx exacerbation COPD             Lab-     CBC w diff, BMET     Recommend Pepto Bismol or Kaopectate for the diarrhea   Recommend you increase your fluid intake with Gatorade or GingerAle to help get your blood pressure back up- you are dehydrated.   Please call as needed         Instructions    Return in about 6 months (around 04/15/2021).  Order- CXR    Dx exacerbation COPD             Lab-     CBC w diff, BMET     Recommend Pepto Bismol or Kaopectate for the diarrhea   Recommend you increase your fluid intake with Gatorade or GingerAle to help get your blood pressure back up-  you are dehydrated.   Please call as needed       Was appointment offered to patient (explain)?  Video visit?  Get recommendations from Dr. Loanne Drilling   Reason for call: Patient states she has had wheezing x 4 days and yellow/green mucous for the past 3 days.  She had to get up at 2 am to do a breathing treatment, take musinex and use her rescue inhaler.  Nothing resolved her wheezing.  She states she had some lab work done for Dr. Sharlett Iles on Friday and some things were low and usually her lab work is normal. She denies any fever, chills, body aches, sick contacts.  She has had her covid vaccines.  She uses oxygen at 1L at night and as needed during the day for sob.  She states she has also had increased sob and had to increase her oxygen to 2L.  She thinks she has a lung infection and is requesting an antibiotic.  She requested an office visit as well, advised that Dr. Annamaria Boots is out of the office this week, that I would get her information  to our physician taking sick calls and see what she recommends and she may want to do a video visit.  She verbalized understanding.  Dr. Loanne Drilling, please advise.  Thank you.  (examples of things to ask: : When did symptoms start? Fever? Cough? Productive? Color to sputum? More sputum than usual? Wheezing? Have you needed increased oxygen? Are you taking your respiratory medications? What over the counter measures have you tried?)  No Known Allergies  Immunization History  Administered Date(s) Administered   H1N1 08/05/2008   Influenza Split 06/25/2011, 05/12/2012   Influenza Whole 06/03/2008, 07/13/2009, 06/25/2010   Influenza,inj,Quad PF,6+ Mos 05/13/2013, 06/24/2014, 06/07/2015, 06/12/2016, 04/30/2017, 06/23/2018   Influenza-Unspecified 05/07/2019   Moderna Sars-Covid-2 Vaccination 12/09/2019, 01/06/2020, 09/14/2020   Pneumococcal Conjugate-13 01/31/2014   Pneumococcal Polysaccharide-23 06/23/2009, 06/14/2019   Td 12/31/2001   Tdap 05/13/2013

## 2021-03-22 ENCOUNTER — Other Ambulatory Visit: Payer: Self-pay | Admitting: Internal Medicine

## 2021-03-30 ENCOUNTER — Other Ambulatory Visit: Payer: Self-pay | Admitting: Cardiovascular Disease

## 2021-04-05 ENCOUNTER — Telehealth: Payer: Self-pay | Admitting: Internal Medicine

## 2021-04-06 NOTE — Telephone Encounter (Signed)
Ambien refilled

## 2021-04-15 NOTE — Progress Notes (Signed)
Patient ID: ILY DENNO, female    DOB: [birth year], 55 y.o.   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 Walk test on room air 06/28/2016-95%, 95%, 95%, 93%, peak heart rate 120/minute. No desaturation after 3185 feet. Office Spirometry 06/28/2016-limited validity due to cough. Restriction of exhaled volume. FVC 1.79/50%, FEV1 1.64/57%, ratio 0.80. FENO- 5 Walk Test on room Air- Qualified for home O2 04/10/17 PFT 06/17/17- severe obstruction, mild restriction, diffusion severely reduced. Insignificant response to bronchodilator. FVC 1.73/47%, FEV1 1.10/38%, ratio 0.64, TLC 77%, DLCO 32% CHF- acute hosp 05/2017- CHF, CAD, EF 20-25%. Stent. 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 Patient diarrhea, headaches,  productive cough with green sputum, increased shortness of breath but denies wheezing. Has had 2 covid tests that were negative Acute illness x 3 days Arrival BP 80/60 both arms, HR 83  Reports temp 103 2 days ago. Denies UTI. Likes Breztri.  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 Feels near baseline now.  Occasionally uses nebulizer machine.  Some routine cough and clear phlegm. CXR 10/06/20- IMPRESSION: Stable postoperative in apparent post radiation therapy change right upper lobe with areas of scarring and retraction. Asymmetric pleural thickening right apex is stable. No new opacity evident. Stable cardiac silhouette. Aortic Atherosclerosis (ICD10-I70.0).   Review of Systems-+ = positive Constitutional:   No-   weight loss, night sweats, fevers, chills, fatigue, lassitude. HEENT:   No-  headaches, difficulty swallowing, tooth/dental problems, sore throat,      mild sneezing,no- itching, ear ache, nasal congestion, post nasal drip,  CV:  No-   chest pain, orthopnea, PND, swelling in lower extremities, anasarca, dizziness, palpitations Resp: + shortness of breath with exertion or at rest.              +productive cough, + non-productive cough,  No- coughing up of blood.               change in color of mucus. + wheezing.   Skin: No-   rash or lesions. GI:  +HPI GU:   MS:  No-   joint pain or swelling.   Neuro-  nothing unusual Psych:  No- change in mood or affect. No depression or  anxiety.  No memory loss.   Objective:   Physical Exam    Has POC General- Alert, Oriented, Affect-appropriate, Distress - NAD, + overweight   Skin- rash-none, lesions- none, excoriation- none.  Lymphadenopathy- none Head- atraumatic            Eyes- Gross vision intact, PERRLA, conjunctivae clear secretions            Ears- Hearing, canals-normal            Nose- , no-Septal dev, mucus, polyps, erosion, perforation             Throat- Mallampati IVI , mucosa clear , drainage- none, tonsils- atrophic,  Neck- flexible , trachea midline, no stridor , thyroid nl, carotid no bruit Chest - symmetrical excursion , unlabored           Heart/CV- RRR rapid , no murmur , no gallop  , no rub, nl s1 s2                           - JVD none , edema- none, stasis changes- none, varices- none           Lung-   Clear/diminished, Wheeze-none, cough-None, dullness-none, rub- none. Rales- none           Chest wall-  Abd-  Br/ Gen/ Rectal- Not done, not indicated Extrem- cyanosis- none, clubbing, none, atrophy- none, strength- nl,    Neuro- grossly intact to observation

## 2021-04-16 ENCOUNTER — Encounter: Payer: Self-pay | Admitting: Internal Medicine

## 2021-04-16 ENCOUNTER — Ambulatory Visit (INDEPENDENT_AMBULATORY_CARE_PROVIDER_SITE_OTHER): Payer: Medicare Other | Admitting: Internal Medicine

## 2021-04-16 ENCOUNTER — Other Ambulatory Visit: Payer: Self-pay

## 2021-04-16 DIAGNOSIS — J9611 Chronic respiratory failure with hypoxia: Secondary | ICD-10-CM

## 2021-04-16 DIAGNOSIS — J449 Chronic obstructive pulmonary disease, unspecified: Secondary | ICD-10-CM | POA: Diagnosis not present

## 2021-04-16 MED ORDER — ALBUTEROL SULFATE HFA 108 (90 BASE) MCG/ACT IN AERS: INHALATION_SPRAY | RESPIRATORY_TRACT | 4 refills | Status: DC

## 2021-04-16 MED ORDER — BREZTRI AEROSPHERE 160-9-4.8 MCG/ACT IN AERO: 2.0000 | INHALATION_SPRAY | Freq: Two times a day (BID) | RESPIRATORY_TRACT | 4 refills | Status: DC

## 2021-04-16 NOTE — Patient Instructions (Signed)
Continue oxygen and your current meds  Please call if we can help

## 2021-04-17 ENCOUNTER — Other Ambulatory Visit: Payer: Self-pay | Admitting: Internal Medicine

## 2021-04-20 MED ORDER — BREZTRI AEROSPHERE 160-9-4.8 MCG/ACT IN AERO: 2.0000 | INHALATION_SPRAY | Freq: Two times a day (BID) | RESPIRATORY_TRACT | 4 refills | Status: DC

## 2021-04-20 NOTE — Telephone Encounter (Signed)
[name] and verified that they received the Ambien prescription.  They stated that they do not have the Breztri inhaler that was sent over on 04/16/2021.  Advised I would send it over again.  Called and spoke with the patient.  The prescriptions were sent over on 04/16/21 after [name] visit and she saw where it came up on [name] mychart and then the Breztri disappeared.  She states she did not need the Breztri and Albuterol right away, however, wanted to have it on file at [name] pharmacy so she could pick it up in September.  Attempted to refill it and received a message that it had been updated in a more recent place.  Discontinued order from 8/15 and sent new order 8/19.  Nothing further needed.

## 2021-06-01 ENCOUNTER — Telehealth: Payer: Self-pay | Admitting: Internal Medicine

## 2021-06-01 MED ORDER — AMOXICILLIN-POT CLAVULANATE 875-125 MG PO TABS: 1.0000 | ORAL_TABLET | Freq: Two times a day (BID) | ORAL | 0 refills | Status: DC

## 2021-06-01 NOTE — Telephone Encounter (Signed)
Called and spoke to patient via telephone.  Patient is concerned that she is developed PNA.  C/o dry cough at times prod with light green sputum, wheezing and increased sob with exertion x3w She wakes up during the night due to wheezing.  Denied f/c/s or additional sx.  She is taking mucinex  Q4H, albuterol HFA QID, albuterol solution BID and Breztri BID without relief.  Fully vaccinated against covid.  No recent covid test.  1L QHS and PRN. Spo2 is maintaining around 92%  Dr. Annamaria Boots, please advise.  Current Outpatient Medications on File Prior to Visit  Medication Sig Dispense Refill   acetaminophen (TYLENOL) 500 MG tablet Take 500 mg by mouth every 8 (eight) hours as needed for moderate pain.     albuterol (VENTOLIN HFA) 108 (90 Base) MCG/ACT inhaler INHALE 2 PUFFS INTO THE LUNGS EVERY 4-6 HOURS AS NEEDED FOR WHEEZING AND SHORTNESS OF BREATH 54 g 4   ALPRAZolam (XANAX) 0.5 MG tablet Take 1 tablet (0.5 mg total) by mouth 2 (two) times daily as needed for up to 1 dose for anxiety. (Patient taking differently: Take 0.5 mg by mouth 2 (two) times daily.) 40 tablet 0   atorvastatin (LIPITOR) 40 MG tablet Take 1 tablet (40 mg total) by mouth daily. 90 tablet 3   Budeson-Glycopyrrol-Formoterol (BREZTRI AEROSPHERE) 160-9-4.8 MCG/ACT AERO Inhale 2 puffs into the lungs in the morning and at bedtime. 10.7 g 4   carvedilol (COREG) 6.25 MG tablet TAKE 1 TABLET(6.25 MG) BY MOUTH TWICE DAILY WITH A MEAL 180 tablet 3   clopidogrel (PLAVIX) 75 MG tablet Take 1 tablet daily 90 tablet 3   ezetimibe (ZETIA) 10 MG tablet Take 10 mg by mouth daily.     furosemide (LASIX) 20 MG tablet Take 1 tablet (20 mg total) by mouth daily. 90 tablet 3   ipratropium-albuterol (DUONEB) 0.5-2.5 (3) MG/3ML SOLN USE 1 VIAL VIA NEBULIZER EVERY 6 HOURS AS NEEDED 90 mL 3   losartan (COZAAR) 25 MG tablet TAKE 1 TABLET(25 MG) BY MOUTH DAILY 90 tablet 2   nitroGLYCERIN (NITROSTAT) 0.4 MG SL tablet Place 1 tablet (0.4 mg total) under the  tongue every 5 (five) minutes as needed for chest pain. (Patient not taking: Reported on 04/16/2021) 30 tablet 12   OXYGEN Inhale 1 L/min into the lungs as needed.     pantoprazole (PROTONIX) 40 MG tablet Take 40 mg by mouth daily.      potassium chloride (KLOR-CON) 10 MEQ tablet Take 1 tablet (10 mEq total) by mouth daily. 90 tablet 4   spironolactone (ALDACTONE) 25 MG tablet TAKE 1 TABLET(25 MG) BY MOUTH DAILY 90 tablet 3   zolpidem (AMBIEN) 5 MG tablet TAKE 1 TABLET BY MOUTH AT BEDTIME AS NEEDED FOR SLEEP 31 tablet 5   No current facility-administered medications on file prior to visit.   No Known Allergies

## 2021-06-01 NOTE — Telephone Encounter (Signed)
Verified with CY via epic secure message--Augmentin 875mg  Patient is aware of recommendations and voiced her understanding.  Rx sent to preferred pharmacy.  Nothing further needed at this time.

## 2021-06-01 NOTE — Telephone Encounter (Signed)
Augmentin 850 mg, # 14, 1 twice daily

## 2021-06-14 ENCOUNTER — Other Ambulatory Visit: Payer: Self-pay | Admitting: Cardiovascular Disease

## 2021-06-14 NOTE — Telephone Encounter (Signed)
Rx(s) sent to pharmacy electronically.  

## 2021-06-26 ENCOUNTER — Ambulatory Visit: Payer: Medicare Other | Admitting: Oncology

## 2021-06-26 ENCOUNTER — Other Ambulatory Visit: Payer: Medicare Other

## 2021-06-29 ENCOUNTER — Ambulatory Visit (HOSPITAL_BASED_OUTPATIENT_CLINIC_OR_DEPARTMENT_OTHER): Payer: Medicare Other | Admitting: Cardiovascular Disease

## 2021-07-03 ENCOUNTER — Telehealth: Payer: Self-pay | Admitting: Internal Medicine

## 2021-07-03 DIAGNOSIS — J441 Chronic obstructive pulmonary disease with (acute) exacerbation: Secondary | ICD-10-CM

## 2021-07-03 MED ORDER — DOXYCYCLINE HYCLATE 100 MG PO TABS: ORAL_TABLET | ORAL | 0 refills | Status: DC

## 2021-07-03 MED ORDER — PREDNISONE 10 MG PO TABS: ORAL_TABLET | ORAL | 0 refills | Status: AC

## 2021-07-03 NOTE — Telephone Encounter (Signed)
Called and spoke with patient to go over Dr. Janee Morn recs. Patient stated that she does not have a flutter valve. I have placed on downstairs for her to pick up and told her someone will show her how to use it when she picks it up. Prescriptions have been sent to pharmacy. Nothing further needed at this time.

## 2021-07-03 NOTE — Telephone Encounter (Signed)
I do advise covid test. When she iss over this acute illness should get flu shot.  Stay well-hydrated and take Mucinex to help loosen mucus. If she still has a Flutter device, she should use that  Please send prednisone 10 mg, # 20, 4 X 2 DAYS, 3 X 2 DAYS, 2 X 2 DAYS, 1 X 2 DAYS       And doxycycline 100 mg, # 8    2 today then one daily

## 2021-07-03 NOTE — Telephone Encounter (Signed)
Called and spoke with patient who states that she is having symptoms of wheezing and cough. Symptoms started about 3 days ago. Did not take a Covid 19 test. Productive cough but very small amounts of yellow/ green sputum. Coughing all the time and worse when laying down. Using rescue inhaler, mucinex, Duoneb and Breztri. Also states that she has also done hot showers to see if it will help lossen up mucus in her chest and it hasn't helped. Patient states that she had same thing about a month ago and can't get rid of it. States she's had low grade fever.   Dr. Annamaria Boots please advise

## 2021-07-11 ENCOUNTER — Telehealth: Payer: Self-pay | Admitting: Internal Medicine

## 2021-07-11 NOTE — Telephone Encounter (Signed)
Pt calling to let us know insurance will no longer cover Ventolin. Pt states said insurance needs a PA  to approved Ventolin. Pt states insurance covers Proair, but per pharmacy no one sells Proair.Please advise [phone] Pharm[facility] on [address]

## 2021-07-11 NOTE — Telephone Encounter (Signed)
Called and spoke with patient. She stated that [facility] will not fill her Ventolin because it is non preferred. And she will need a PA. She can not tolerate the ProAir respliclick because it is in the powdered form and she can not tolerate the powdered inhalers.   I confirmed that her insurance is still [employer]. Member ID: [id number].   Will route to the PA pool to see if they can start a PA for her Ventolin.

## 2021-07-12 ENCOUNTER — Telehealth: Payer: Self-pay | Admitting: Pharmacy Technician

## 2021-07-12 ENCOUNTER — Other Ambulatory Visit (HOSPITAL_COMMUNITY): Payer: Self-pay

## 2021-07-12 NOTE — Telephone Encounter (Signed)
Patient Advocate Encounter  Received notification from [place] that prior authorization for VENTOLIN is required.   PA submitted on 11.10.22 Key [id number] Status is pending   [facility] will continue to follow  Luciano Cutter, CPhT Patient Advocate Phone: [phone] Fax:  [phone]

## 2021-07-12 NOTE — Telephone Encounter (Signed)
Patient Advocate Encounter  Received notification from [place] that prior authorization for VENTOLING is required.   PA NOT NEEDED, DUE TO CHANGING TO [place], CPhT Patient Advocate Phone: [phone] Fax:  [phone]

## 2021-07-12 NOTE — Telephone Encounter (Signed)
My apologies, Pa has been submitted for Ventolin. Did not see this original message when responding to request.

## 2021-07-13 ENCOUNTER — Other Ambulatory Visit (HOSPITAL_COMMUNITY): Payer: Self-pay

## 2021-07-13 NOTE — Telephone Encounter (Signed)
Received notification from [place] regarding a prior authorization for VENTOLIN. Authorization has been APPROVED from 11.11.22 to 12.31.23.    Authorization #  [id number]

## 2021-07-24 NOTE — Telephone Encounter (Signed)
There is a separate encounter from pharmacy team about PA being done for Ventolin inhaler and it was approved. Closing encounter.

## 2021-08-03 ENCOUNTER — Other Ambulatory Visit: Payer: Self-pay | Admitting: Oncology

## 2021-08-03 ENCOUNTER — Other Ambulatory Visit: Payer: Self-pay

## 2021-08-07 ENCOUNTER — Other Ambulatory Visit: Payer: Self-pay

## 2021-08-07 DIAGNOSIS — C4371 Malignant melanoma of right lower limb, including hip: Secondary | ICD-10-CM

## 2021-08-07 NOTE — Progress Notes (Signed)
[name]  [phone] [phone]     ID: PASHA GADISON DOB: February 04, 1966  MR#: [id number]  XKG#:[id number]  Patient Care Team: Leanna Battles, MD as PCP - General (Internal Medicine) Skeet Latch, MD as PCP - Cardiology (Cardiology) Deneise Lever, MD (Pulmonary Disease) Harriett Sine, MD as Consulting Physician (Dermatology) Armbruster, Carlota Raspberry, MD as Consulting Physician (Gastroenterology) Somalia Segler, Virgie Dad, MD as Consulting Physician (Oncology) Larey Dresser, MD as Consulting Physician (Cardiology) OTHER MD: Griselda Miner M.D., Rob Hickman M.D., Georganna Skeans MD  CHIEF COMPLAINT: malignant melanoma, colon carcinoma, lung cancer  CURRENT TREATMENT: Observation   INTERVAL HISTORY: [name] returns today for follow-up of her remote lung cancer, and her more recent colon and melanoma cancers. She continues under observation.  Since her last visit, she underwent bilateral screening mammography with tomography at [facility] on 10/30/2020 showing: breast density category C; no evidence of malignancy in either breast.   Recall she underwent cardiac catheterization on 05/12/2017 with the placement of a Sierra 15 mm drug-eluting stent 4 and ostial left main lesion.  She continues on antiplatelet agents, with some bruising but no overt bleeding   REVIEW OF SYSTEMS: [name] is on intermittent oxygen at home.  Today she says is a bad day because of the weather so she is carrying her portable oxygen.  Because of the pulmonary issues she is not able to exercise she says.  Since she quit smoking she is gained weight.  She is not sure how she can lose the weight but she is worried about that.  She has a growth on the left [name] that she wanted me to look at today.  Aside from these issues a detailed review of systems today was stable.   COVID 19 VACCINATION STATUS: Moderna x3, also had COVID x1, treated with Paxlobid   HISTORY  OF PRESENT ILLNESS:  From the prior summary:  I know [name] from her remote history of lung carcinoma, which has not recurred. More recently, her significant other [name] noticed a lesion in her right posterior calf. He keeps a picture Atlas of melanoma lesions on his bathroom wall, and told her he was "99% sure" the lesion there would be a melanoma. The patient brought it to Dr. Marjean Donna attention and a punch biopsy was obtained 09/06/2014. This showed ([id number]) malignant melanoma, with a maximum thickness of 0.33 mm, anatomic level III, no ulceration, with negative deep margins but clear peripheral margins. The mitotic index was low, less than one per square millimeter. There was no evidence of lymphovascular invasion. There was brisk tumor infiltrating lymphocytes. Tumor regression was noted across the full aspect of the punch biopsy.   This was read as a pathologic stage TIa, and the patient was referred to Dr. Sarajane Jews for wide excision, performed 09/13/2014. Dr. Everett Graff note indicates a 1 cm margin around the initial biopsy site. The final pathology ([id number]) showed a tumor thickness of 1.15, anatomic level III, with ulceration seen only in the area of prior biopsy, (and therefore read as negative). Peripheral as well as deep margins were free. Again the mitotic index was low there was no evidence of vascular invasion and tumor infiltrating lymphocytes were brisk. This was read as a pT2a lesion.   The patient is referred for further evaluation and treatment.   PAST MEDICAL HISTORY: Past Medical History:  Diagnosis Date   Acute bilateral low back pain without sciatica    Acute congestive heart failure ([place]) 05/02/2017  EF 20-25% on ECHO   Acute respiratory failure with hypoxia (HCC) 05/01/2017   Adhesive capsulitis 05/22/2011   Allergic rhinitis    Allergy    Anxiety disorder    Aortic valve regurgitation 05/15/2017   Asthma    Chronic bronchitis    Chronic diastolic  heart failure ([place]) 12/22/2019   Colon cancer ([place])    adenocarcinoma in a polyps 11-24-2014   COPD (chronic obstructive pulmonary disease) (HCC)    Cough    Dizziness and giddiness    Dyspepsia    Dyspnea 04/30/2017   Emphysema of lung [facility])    External hemorrhoids    EXTERNAL HEMORRHOIDS 09/27/2009   Female stress incontinence    Genetic testing 08/18/2016   Negative for known pathogenic mutations within any of 25 genes on a Custom Panel through [employer].  One variant of uncertain significance (VUS) called "c.7187C>G (p.Thr2396Ser)" was found in one copy of the ATM gene.  This Custom Cancer Panel offered by [employer] includes sequencing and/or duplication/deletion testing of the following 25 genes: APC, ATM, AXIN2, BAP1, BMPR1A, BRCA1, BRCA2, CDH1, CDK4, CDKN2A, CHEK2, EPCAM, MITF, MLH1, MSH2, MSH6, MUTYH, PMS2, POLD1, POLE, PTEN, SCG5/GREM1, SMAD4, STK11, and TP53. Date of report is July 23, 2016.  MSH2 Exons 1-7 Inversion Analysis was also negative through [employer].  Date of report is July 23, 2016.      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 NPSG was normal years ago.     Swelling of right lower extremity 12/01/2014    PAST SURGICAL HISTORY: Past Surgical History:  Procedure Laterality Date   ABDOMINAL HYSTERECTOMY  2001   no BSO   biopsy of right neck mass     negative-thyroid biopsy    BREAST EXCISIONAL BIOPSY Right 1987   no visible  scar   BRONCHOSCOPY  01/31/2005   nonspecific inflammation   BRONCHOSCOPY  08/24/2010   nonspecific inflammation   COLONOSCOPY     COLONOSCOPY WITH PROPOFOL N/A 02/07/2020   Procedure: COLONOSCOPY WITH PROPOFOL;  Surgeon: Yetta Flock, MD;  Location: [facility];  Service: Gastroenterology;  Laterality: N/A;   CORONARY STENT INTERVENTION N/A 05/12/2017   Procedure: CORONARY STENT INTERVENTION;  Surgeon: Charolette Forward, MD;  Location: [facility];  Service: Cardiovascular;  Laterality: N/A;   LUNG REMOVAL, PARTIAL     for lung cancer--followed by Dr. Arlyce Dice and Dr. Annamaria Boots   MELANOMA EXCISION WITH SENTINEL LYMPH NODE BIOPSY Right 11/29/2014   Procedure: RIGHT INGUINAL SENTINEL LYMPH NODE BIOPSY;  Surgeon: Georganna Skeans, MD;  Location: [facility];  Service: General;  Laterality: Right;   melanoma removal     right calf    POLYPECTOMY     POLYPECTOMY  02/07/2020   Procedure: POLYPECTOMY;  Surgeon: Yetta Flock, MD;  Location: [facility];  Service: Gastroenterology;;   portacath removed  10/2007  RIGHT/LEFT HEART CATH AND CORONARY ANGIOGRAPHY N/A 05/06/2017   Procedure: RIGHT/LEFT HEART CATH AND CORONARY ANGIOGRAPHY;  Surgeon: [name], MD;  Location: [facility];  Service: Cardiovascular;  Laterality: N/A;   SPIROMETRY  07/03/2002   min obstruction,mild restriction 01/31/2005   tvh  12/01/2000   hysterectomy    FAMILY HISTORY Family History  Problem Relation Age of Onset   Lung cancer Father        smoker and worked at a Academic librarian   COPD Mother        not a smoker   Other Mother        hx of hysterectomy for unspecified reason   Asthma Daughter    Diabetes Maternal Grandfather    Colon polyps Brother        approx 16 polyps on his first colonoscopy   Other Maternal Aunt        non-cancerous growth in lungs; respiratory issues; smoker   COPD Maternal Grandmother        d. 66   Cancer Paternal Grandfather        oral/mouth cancer, chewed tobacco, dx at  older age   Throat cancer Other        maternal great aunt (MGF's sister); not a smoker   Cirrhosis Paternal Uncle        hx of alcohol abuse    Colon cancer Neg Hx    Rectal cancer Neg Hx    Stomach cancer Neg Hx    the patient's father died in his mid 30s from lung cancer. The patient's mother is living, in her 17s. The patient has one brother, no sisters.    GYNECOLOGIC HISTORY:  No LMP recorded. Patient has had a hysterectomy. Menarche age 56, first live birth age 57. The patient is GX P1. She had a hysterectomy approximately 2001. She did not undergo salpingo-oophorectomy. She did not take hormone replacement   SOCIAL HISTORY: (Updated October 2021) She is disabled and lives by herself, with a cat. She is divorced. Her significant other, [name], works as a Chief Strategy Officer and comes by occasionally. Her daughter [name], 46 years old as of July 2019, lives in [place] and in addition to working as a Engineer, materials has 7 children, the oldest 62. She works out of her home.  Her husband is Freight forwarder at [employer].    ADVANCED DIRECTIVES:  The patient intends to name her mother [name] as healthcare power of attorney. She can be reached at [phone]. At the 11/07/2014 visit patient received the appropriate documents  tocomplete and notarize at her discretion   HEALTH MAINTENANCE: Social History   Tobacco Use   Smoking status: Former    Packs/day: 2.00    Years: 23.00    Pack years: 46.00    Types: Cigarettes    Start date: 09/02/1978    Quit date: 10/26/2001    Years since quitting: 19.7   Smokeless tobacco: Never   Tobacco comments:    Significant passive exposure from boy friend  Vaping Use   Vaping Use: Never used  Substance Use Topics   Alcohol use: No    Alcohol/week: 0.0 standard drinks    Comment: history of alcohol abuse 20-30   Drug use: No    Comment: +MJ      Colonoscopy: 02/2020 (Dr. Havery Moros), recall 2026  PAP: Status post hysterectomy  Bone density:  never  Lipid profile: 06/16/2017, good   No Known Allergies  Current Outpatient Medications  Medication Sig  Dispense Refill   acetaminophen (TYLENOL) 500 MG tablet Take 500 mg by mouth every 8 (eight) hours as needed for moderate pain.     acyclovir (ZOVIRAX) 800 MG tablet Take 800 mg by mouth 2 (two) times daily.     albuterol (VENTOLIN HFA) 108 (90 Base) MCG/ACT inhaler INHALE 2 PUFFS INTO THE LUNGS EVERY 4-6 HOURS AS NEEDED FOR WHEEZING AND SHORTNESS OF BREATH 54 g 4   ALPRAZolam (XANAX) 0.5 MG tablet Take 1 tablet (0.5 mg total) by mouth 2 (two) times daily as needed for up to 1 dose for anxiety. (Patient taking differently: Take 0.5 mg by mouth 2 (two) times daily.) 40 tablet 0   atorvastatin (LIPITOR) 40 MG tablet Take 1 tablet (40 mg total) by mouth daily. 90 tablet 3   Budeson-Glycopyrrol-Formoterol (BREZTRI AEROSPHERE) 160-9-4.8 MCG/ACT AERO Inhale 2 puffs into the lungs in the morning and at bedtime. 10.7 g 4   carvedilol (COREG) 6.25 MG tablet TAKE 1 TABLET(6.25 MG) BY MOUTH TWICE DAILY WITH A MEAL 180 tablet 3   clopidogrel (PLAVIX) 75 MG tablet Take 1 tablet daily 90 tablet 3   doxycycline (VIBRA-TABS) 100 MG tablet Take 2 tablets today and then 1 tablet daily until gone 8 tablet 0   ezetimibe (ZETIA) 10 MG tablet Take 10 mg by mouth daily.     furosemide (LASIX) 20 MG tablet Take 1 tablet (20 mg total) by mouth daily. 90 tablet 3   ipratropium (ATROVENT) 0.06 % nasal spray Place 1 spray into both nostrils 3 (three) times daily.     ipratropium-albuterol (DUONEB) 0.5-2.5 (3) MG/3ML SOLN USE 1 VIAL VIA NEBULIZER EVERY 6 HOURS AS NEEDED 90 mL 3   losartan (COZAAR) 25 MG tablet TAKE 1 TABLET(25 MG) BY MOUTH DAILY 90 tablet 2   nitroGLYCERIN (NITROSTAT) 0.4 MG SL tablet Place 1 tablet (0.4 mg total) under the tongue every 5 (five) minutes as needed for chest pain. (Patient not taking: Reported on 04/16/2021) 30 tablet 12   OXYGEN Inhale 1 L/min into the lungs as needed.      pantoprazole (PROTONIX) 40 MG tablet Take 40 mg by mouth daily.      potassium chloride (KLOR-CON) 10 MEQ tablet TAKE 1 TABLET(10 MEQ) BY MOUTH DAILY 90 tablet 4   spironolactone (ALDACTONE) 25 MG tablet TAKE 1 TABLET(25 MG) BY MOUTH DAILY 90 tablet 1   triamcinolone cream (KENALOG) 0.1 % Apply topically 2 (two) times daily.     zolpidem (AMBIEN) 5 MG tablet TAKE 1 TABLET BY MOUTH AT BEDTIME AS NEEDED FOR SLEEP 31 tablet 5   No current facility-administered medications for this visit.    OBJECTIVE: White woman wearing receiving oxygen through nasal cannula from a portable concentrator  Vitals:   08/08/21 1027  BP: (!) 171/68  Pulse: 96  Resp: 18  Temp: 97.8 F (36.6 C)  SpO2: 95%      Body mass index is 29.05 kg/m.    ECOG FS:1 - Symptomatic but completely ambulatory  Sclerae unicteric, EOMs intact Wearing a mask No cervical or supraclavicular adenopathy Lungs no rales or rhonchi Heart regular rate and rhythm Abd soft, nontender, positive bowel sounds MSK no focal spinal tenderness, no upper extremity lymphedema Neuro: nonfocal, well oriented, appropriate affect Breasts: Deferred Skin: The mass she is palpating on her left [name] is consistent with lipoma.  LAB RESULTS:  CMP     Component Value Date/Time   NA 136 10/16/2020 1113   NA 141 12/20/2019 1111   NA  140 06/20/2017 1234   K 4.3 10/16/2020 1113   K 3.6 06/20/2017 1234   CL 103 10/16/2020 1113   CL 111 (H) 07/09/2012 0958   CO2 24 10/16/2020 1113   CO2 26 06/20/2017 1234   GLUCOSE 90 10/16/2020 1113   GLUCOSE 91 06/20/2017 1234   GLUCOSE 80 07/09/2012 0958   BUN 14 10/16/2020 1113   BUN 12 12/20/2019 1111   BUN 9.8 06/20/2017 1234   CREATININE 1.01 10/16/2020 1113   CREATININE 0.93 06/26/2020 0902   CREATININE 0.8 06/20/2017 1234   CALCIUM 9.9 10/16/2020 1113   CALCIUM 9.4 06/20/2017 1234   PROT 7.4 06/26/2020 0902   PROT 7.3 12/20/2019 1111   PROT 8.0 06/20/2017 1234   ALBUMIN 4.0 06/26/2020 0902    ALBUMIN 4.7 12/20/2019 1111   ALBUMIN 4.2 06/20/2017 1234   AST 12 (L) 06/26/2020 0902   AST 15 06/20/2017 1234   ALT 11 06/26/2020 0902   ALT 9 06/20/2017 1234   ALKPHOS 121 06/26/2020 0902   ALKPHOS 95 06/20/2017 1234   BILITOT 0.8 06/26/2020 0902   BILITOT 1.04 06/20/2017 1234   GFRNONAA >60 06/26/2020 0902   GFRAA 105 12/20/2019 1111   GFRAA >60 06/24/2019 1055    INo results found for: SPEP, UPEP  Lab Results  Component Value Date   WBC 6.2 08/08/2021   NEUTROABS 5.0 08/08/2021   HGB 12.8 08/08/2021   HCT 37.4 08/08/2021   MCV 82.9 08/08/2021   PLT 249 08/08/2021      Chemistry      Component Value Date/Time   NA 136 10/16/2020 1113   NA 141 12/20/2019 1111   NA 140 06/20/2017 1234   K 4.3 10/16/2020 1113   K 3.6 06/20/2017 1234   CL 103 10/16/2020 1113   CL 111 (H) 07/09/2012 0958   CO2 24 10/16/2020 1113   CO2 26 06/20/2017 1234   BUN 14 10/16/2020 1113   BUN 12 12/20/2019 1111   BUN 9.8 06/20/2017 1234   CREATININE 1.01 10/16/2020 1113   CREATININE 0.93 06/26/2020 0902   CREATININE 0.8 06/20/2017 1234      Component Value Date/Time   CALCIUM 9.9 10/16/2020 1113   CALCIUM 9.4 06/20/2017 1234   ALKPHOS 121 06/26/2020 0902   ALKPHOS 95 06/20/2017 1234   AST 12 (L) 06/26/2020 0902   AST 15 06/20/2017 1234   ALT 11 06/26/2020 0902   ALT 9 06/20/2017 1234   BILITOT 0.8 06/26/2020 0902   BILITOT 1.04 06/20/2017 1234       Lab Results  Component Value Date   LABCA2 26 07/21/2007    No components found for: [id number]  No results for input(s): INR in the last 168 hours.  Urinalysis    Component Value Date/Time   LABSPEC 1.005 07/16/2012 1207   PHURINE 6.0 07/16/2012 1207   HGBUR Negative 07/16/2012 1207   BILIRUBINUR Negative 07/16/2012 1207   KETONESUR Negative 07/16/2012 1207   PROTEINUR Negative 07/16/2012 1207   NITRITE Negative 07/16/2012 1207   LEUKOCYTESUR Trace 07/16/2012 1207    STUDIES: No results found.   ASSESSMENT: 55  y.o. [place] woman  A: LUNG CANCER  (1) status post right upper lobe wedge resection of a T1 N2, stage IIIA non-small cell lung cancer (adenocarcinoma) measuring 1.8 cm, poorly differentiated, with negative margins, but with 4 positive lymph nodes, 3 N1 and 1 N2;   (a) on [place] 2004 to 2009  (b) no evidence of disease recurrence on most recent scans, and  likely cured  (2) ashthmatic bronchitis/ COPD  (3) tobacco abuse: The patient quit smoking 2001  B: MALIGNANT MELANOMA  (4) s/p punch biopsy Right posterior calf for malignant melanoma 0.33 mm thickness, level III, w/o ulceration and with a low mitotic index, free deep but positive lateral margins  (5) status post wide excision 09/13/2014 showing a maximum tumor thickness of 1.15 mm, and anatomic level III, with ulceration noted only at the site of prior biopsy, with clear margins (1 cm per surgical note) with a low mitotic index and brisk infiltration by tumor infiltrating lymphocytes: pT2a NX MX = stage IB  (6) right inguinal sentinel lymph node biopsy 11/29/2014 showed no evidence of melanoma  C: COLON CANCER (Armbruster following) (7) colonoscopy 11/29/2014 shows adenocarcinoma in ascending colon polyp; repeat colonoscopy 12/30/2014 removed a tubular adenoma in that area which was negative for malignancy. The area was marked for possible definitive surgery  (a) Repeat colonoscopy with polypectomy 12/30/2014 showed tubular adenomas.  (b) repeat colonoscopy 11/29/2015 showed an additional 4-5 adenomatous, which were removed.  Repeat colonoscopy was recommended in 1 year  (c) colonoscopy 01/10/2017 showed no new polyps.    (d) colonoscopy 02/07/2020 (Armbruster) showed a tubular adenoma:  (e) colonoscopy 2026  D: GENETICS (8) Genetic testing 07/23/2016 through the [facility] Panel (with MSH2 Exons 1-7 Inversion Analysis) found no deleterious mutations.in APC, ATM, AXIN2, BAP1, BMPR1A, BRCA1, BRCA2, CDH1, CDK4, CDKN2A,  CHEK2, EPCAM, MITF, MLH1, MSH2, MSH6, MUTYH, PMS2, POLD1, POLE, PTEN, SCG5/GREM1, SMAD4, STK11, and TP53.   (a) One variant of uncertain significance (VUS) called "c.7187C>G (p.Thr2396Ser)" was found in one copy of the ATM gene  E: Significant cardiopulmonary disease followed by pulmonary and cardiology    PLAN: Samaa is now 19 years out from initial diagnosis of her lung cancer.  She has had other cancers since then as detailed above.  At present her main problems of course are cardiopulmonary.  She has excellent follow-up through primary care and various subspecialists.  We considered discharging from her from follow-up here, however as she likes the idea of coming here at least once a year because "you keep finding more cancers" and in any case it does serve to reassure her.  With regards to that I let her know that the mass she is palpating in her left [name] is a benign lipoma.  She is concerned about lung cancer screening and I have put her in for a low-dose chest CT sometime before she sees Dr. Annamaria Boots in February.  She is scheduled for mammography in March.  She will return to see [place] in about 6 months.  She knows to call for any other issue that may develop before then  Total encounter time 25 minutes.*    Ganon Demasi, Virgie Dad, MD  08/08/21 10:48 AM Medical Oncology and Hematology [facility] [place], [place] [address] Tel. [phone]    Fax. [phone]    I, Wilburn Mylar am acting as scribe for Dr. Virgie Dad. Lee-Ann Gal.  I, Lurline Del MD, have reviewed the above documentation for accuracy and completeness, and I agree with the above.   *Total Encounter Time as defined by the Centers for Medicare and Medicaid Services includes, in addition to the face-to-face time of a patient visit (documented in the note above) non-face-to-face time: obtaining and reviewing outside history, ordering and reviewing medications, tests or procedures, care  coordination (communications with other health care professionals or caregivers) and documentation in the medical record.

## 2021-08-08 ENCOUNTER — Inpatient Hospital Stay: Payer: Medicare Other | Attending: Oncology

## 2021-08-08 ENCOUNTER — Other Ambulatory Visit: Payer: Self-pay

## 2021-08-08 ENCOUNTER — Inpatient Hospital Stay (HOSPITAL_BASED_OUTPATIENT_CLINIC_OR_DEPARTMENT_OTHER): Payer: Medicare Other | Admitting: Oncology

## 2021-08-08 VITALS — BP 171/68 | HR 96 | Temp 97.8°F | Resp 18 | Ht 65.0 in | Wt 174.6 lb

## 2021-08-08 DIAGNOSIS — C189 Malignant neoplasm of colon, unspecified: Secondary | ICD-10-CM

## 2021-08-08 DIAGNOSIS — C3491 Malignant neoplasm of unspecified part of right bronchus or lung: Secondary | ICD-10-CM | POA: Diagnosis not present

## 2021-08-08 DIAGNOSIS — Z85820 Personal history of malignant melanoma of skin: Secondary | ICD-10-CM | POA: Insufficient documentation

## 2021-08-08 DIAGNOSIS — C4371 Malignant melanoma of right lower limb, including hip: Secondary | ICD-10-CM

## 2021-08-08 DIAGNOSIS — Z9221 Personal history of antineoplastic chemotherapy: Secondary | ICD-10-CM | POA: Diagnosis not present

## 2021-08-08 DIAGNOSIS — Z85118 Personal history of other malignant neoplasm of bronchus and lung: Secondary | ICD-10-CM | POA: Insufficient documentation

## 2021-08-08 DIAGNOSIS — Z902 Acquired absence of lung [part of]: Secondary | ICD-10-CM | POA: Insufficient documentation

## 2021-08-08 DIAGNOSIS — Z79899 Other long term (current) drug therapy: Secondary | ICD-10-CM | POA: Insufficient documentation

## 2021-08-08 DIAGNOSIS — Z85038 Personal history of other malignant neoplasm of large intestine: Secondary | ICD-10-CM | POA: Diagnosis present

## 2021-08-08 DIAGNOSIS — J449 Chronic obstructive pulmonary disease, unspecified: Secondary | ICD-10-CM | POA: Insufficient documentation

## 2021-08-08 DIAGNOSIS — Z87891 Personal history of nicotine dependence: Secondary | ICD-10-CM | POA: Diagnosis not present

## 2021-08-08 LAB — CBC WITH DIFFERENTIAL (CANCER CENTER ONLY)
Abs Immature Granulocytes: 0.02 10*3/uL (ref 0.00–0.07)
Basophils Absolute: 0 10*3/uL (ref 0.0–0.1)
Basophils Relative: 1 %
Eosinophils Absolute: 0.1 10*3/uL (ref 0.0–0.5)
Eosinophils Relative: 1 %
HCT: 37.4 % (ref 36.0–46.0)
Hemoglobin: 12.8 g/dL (ref 12.0–15.0)
Immature Granulocytes: 0 %
Lymphocytes Relative: 10 %
Lymphs Abs: 0.6 10*3/uL — ABNORMAL LOW (ref 0.7–4.0)
MCH: 28.4 pg (ref 26.0–34.0)
MCHC: 34.2 g/dL (ref 30.0–36.0)
MCV: 82.9 fL (ref 80.0–100.0)
Monocytes Absolute: 0.5 10*3/uL (ref 0.1–1.0)
Monocytes Relative: 8 %
Neutro Abs: 5 10*3/uL (ref 1.7–7.7)
Neutrophils Relative %: 80 %
Platelet Count: 249 10*3/uL (ref 150–400)
RBC: 4.51 MIL/uL (ref 3.87–5.11)
RDW: 13.1 % (ref 11.5–15.5)
WBC Count: 6.2 10*3/uL (ref 4.0–10.5)
nRBC: 0 % (ref 0.0–0.2)

## 2021-08-08 LAB — CMP (CANCER CENTER ONLY)
ALT: 13 U/L (ref 0–44)
AST: 15 U/L (ref 15–41)
Albumin: 4.1 g/dL (ref 3.5–5.0)
Alkaline Phosphatase: 99 U/L (ref 38–126)
Anion gap: 12 (ref 5–15)
BUN: 17 mg/dL (ref 6–20)
CO2: 20 mmol/L — ABNORMAL LOW (ref 22–32)
Calcium: 8.8 mg/dL — ABNORMAL LOW (ref 8.9–10.3)
Chloride: 106 mmol/L (ref 98–111)
Creatinine: 1.02 mg/dL — ABNORMAL HIGH (ref 0.44–1.00)
GFR, Estimated: >60 mL/min (ref >60)
Glucose, Bld: 113 mg/dL — ABNORMAL HIGH (ref 70–99)
Potassium: 4 mmol/L (ref 3.5–5.1)
Sodium: 138 mmol/L (ref 135–145)
Total Bilirubin: 1.1 mg/dL (ref 0.3–1.2)
Total Protein: 7.5 g/dL (ref 6.5–8.1)

## 2021-08-08 NOTE — Progress Notes (Signed)
Cardiology Office Note  Date:  08/09/2021   ID:  [name], DOB 17-Dec-1965, MRN [id number]  PCP:  Leanna Battles, MD  Cardiologist:   Skeet Latch, MD   No chief complaint on file.   History of Present Illness: [name] is a 55 y.o. female with chronic systolic and diastolic heart failure (LVEF improved from 20 to 25% to 50%), CAD status post PCI, severe COPD, lung cancer in remission (lobectomy, XRT, and chemo), colon cancer status post resection, and melanoma here for follow up.  She was previously a patient of Dr. Marigene Ehlers.  She presented 04/2017 with acute systolic heart failure (LVEF was 20 to 25% with inferior/inferoseptal akinesis.  Her RV was mildly dilated and had decreased systolic function.  Left heart cath revealed 70% ostial left main disease.  She had a drug-eluting stent placed in the ostial LAD left main.  Subsequent echo 08/2017 revealed an improvement in her function to 50 to 55%.    She had moderate aortic regurgitation at that time.  She last followed up in the heart failure clinic 09/2018 and was doing well.  [name] reported chronic shortness of breath that had been ongoing since her lung cancer, and believed it was worsening. She used oxygen regularly at night and tried to avoid using it during the day. A previous sleep study showed oxygen desaturations. She also noted chronic right ankle edema. At her last appointment [name] was doing well but continued to struggle with dyspnea on exertion. She followed up with Doreene Adas on 12/2020. She had increased shortness of breath and thought she had pneumonia. She was stable from a cardiac standpoint, and we recommended that she follow up with pulmonary. They treated with her antibiotics.   Today, she has been feeling pretty good. Her main concern is frequent numbness in her bilateral hands, mostly in her left hand. The numbness is present in her entire hands. Mainly this is noticeable when lying down at  night, when she is lying down on her right side. The numbness may occur randomly while sitting or with activity as well. She also presents a knot-like structure in her left forearm, with onset in the past couple of days. Of note, she reports having an enlarged right lobe of her thyroid found in 2008. She is exercising as much as possible, but she continues to be limited by shortness of breath. When she tries to pick up an object, she feels her heart begin to race and she becomes short of breath. This has been ongoing for a long time, and she usually tries to avoid picking up heavy objects. Sometimes she has LE edema, but this has generally been controlled with furosemide. At home she does not monitor her blood pressure, but does check her oxygen saturation levels. She denies any chest pain. No lightheadedness, headaches, syncope, orthopnea, or PND. She is fasting this morning.  Past Medical History:  Diagnosis Date   Acute bilateral low back pain without sciatica    Acute congestive heart failure ([place]) 05/02/2017   EF 20-25% on ECHO   Acute respiratory failure with hypoxia (HCC) 05/01/2017   Adhesive capsulitis 05/22/2011   Allergic rhinitis    Allergy    Anxiety disorder    Aortic valve regurgitation 05/15/2017   Arm numbness 08/09/2021   Asthma    Chronic bronchitis    Chronic diastolic heart failure ([place]) 12/22/2019   Colon cancer ([place])    adenocarcinoma in a polyps 11-24-2014  COPD (chronic obstructive pulmonary disease) (HCC)    Cough    Dizziness and giddiness    Dyspepsia    Dyspnea 04/30/2017   Emphysema of lung [facility])    External hemorrhoids    EXTERNAL HEMORRHOIDS 09/27/2009   Female stress incontinence    Genetic testing 08/18/2016   Negative for known pathogenic mutations within any of 25 genes on a Custom Panel through [employer].  One variant of uncertain significance (VUS) called "c.7187C>G (p.Thr2396Ser)" was found in one copy of the ATM gene.  This Custom Cancer Panel offered  by [employer] includes sequencing and/or duplication/deletion testing of the following 25 genes: APC, ATM, AXIN2, BAP1, BMPR1A, BRCA1, BRCA2, CDH1, CDK4, CDKN2A, CHEK2, EPCAM, MITF, MLH1, MSH2, MSH6, MUTYH, PMS2, POLD1, POLE, PTEN, SCG5/GREM1, SMAD4, STK11, and TP53. Date of report is July 23, 2016.  MSH2 Exons 1-7 Inversion Analysis was also negative through [employer].  Date of report is July 23, 2016.      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 NPSG was normal years ago.     Swelling of right lower extremity 12/01/2014    Past Surgical History:  Procedure Laterality Date   ABDOMINAL HYSTERECTOMY  2001   no BSO   biopsy of right neck mass     negative-thyroid biopsy    BREAST EXCISIONAL BIOPSY Right 1987   no visible scar   BRONCHOSCOPY  01/31/2005   nonspecific inflammation   BRONCHOSCOPY  08/24/2010   nonspecific inflammation   COLONOSCOPY     COLONOSCOPY WITH PROPOFOL N/A 02/07/2020   Procedure: COLONOSCOPY WITH PROPOFOL;  Surgeon: Yetta Flock, MD;  Location: [facility];  Service: Gastroenterology;  Laterality: N/A;   CORONARY STENT INTERVENTION N/A 05/12/2017   Procedure: CORONARY STENT INTERVENTION;  Surgeon: Charolette Forward, MD;  Location: [place] [facility];  Service: Cardiovascular;  Laterality: N/A;   LUNG REMOVAL, PARTIAL     for lung cancer--followed by Dr. Arlyce Dice and Dr. Annamaria Boots   MELANOMA EXCISION WITH SENTINEL LYMPH NODE BIOPSY  Right 11/29/2014   Procedure: RIGHT INGUINAL SENTINEL LYMPH NODE BIOPSY;  Surgeon: Georganna Skeans, MD;  Location: [place];  Service: General;  Laterality: Right;   melanoma removal     right calf    POLYPECTOMY     POLYPECTOMY  02/07/2020   Procedure: POLYPECTOMY;  Surgeon: Yetta Flock, MD;  Location: [facility];  Service: Gastroenterology;;   portacath removed  10/2007   RIGHT/LEFT HEART CATH AND CORONARY ANGIOGRAPHY N/A 05/06/2017   Procedure: RIGHT/LEFT HEART CATH AND CORONARY ANGIOGRAPHY;  Surgeon: [name], MD;  Location: [place] [facility];  Service: Cardiovascular;  Laterality: N/A;   SPIROMETRY  07/03/2002   min obstruction,mild restriction 01/31/2005   tvh  12/01/2000   hysterectomy     Current Outpatient Medications  Medication Sig Dispense Refill   acetaminophen (TYLENOL) 500 MG tablet  Take 500 mg by mouth every 8 (eight) hours as needed for moderate pain.     acyclovir (ZOVIRAX) 800 MG tablet Take 800 mg by mouth 2 (two) times daily.     albuterol (VENTOLIN HFA) 108 (90 Base) MCG/ACT inhaler INHALE 2 PUFFS INTO THE LUNGS EVERY 4-6 HOURS AS NEEDED FOR WHEEZING AND SHORTNESS OF BREATH 54 g 4   ALPRAZolam (XANAX) 0.5 MG tablet Take 1 tablet (0.5 mg total) by mouth 2 (two) times daily as needed for up to 1 dose for anxiety. (Patient taking differently: Take 0.5 mg by mouth 2 (two) times daily.) 40 tablet 0   atorvastatin (LIPITOR) 40 MG tablet Take 1 tablet (40 mg total) by mouth daily. 90 tablet 3   Budeson-Glycopyrrol-Formoterol (BREZTRI AEROSPHERE) 160-9-4.8 MCG/ACT AERO Inhale 2 puffs into the lungs in the morning and at bedtime. 10.7 g 4   carvedilol (COREG) 6.25 MG tablet TAKE 1 TABLET(6.25 MG) BY MOUTH TWICE DAILY WITH A MEAL 180 tablet 3   clopidogrel (PLAVIX) 75 MG tablet Take 1 tablet daily 90 tablet 3   ezetimibe (ZETIA) 10 MG tablet Take 10 mg by mouth daily.     furosemide (LASIX) 20 MG tablet Take 1 tablet (20 mg total) by mouth daily. 90 tablet 3   ipratropium  (ATROVENT) 0.06 % nasal spray Place 1 spray into both nostrils 3 (three) times daily.     ipratropium-albuterol (DUONEB) 0.5-2.5 (3) MG/3ML SOLN USE 1 VIAL VIA NEBULIZER EVERY 6 HOURS AS NEEDED 90 mL 3   losartan (COZAAR) 25 MG tablet TAKE 1 TABLET(25 MG) BY MOUTH DAILY 90 tablet 2   nitroGLYCERIN (NITROSTAT) 0.4 MG SL tablet Place 1 tablet (0.4 mg total) under the tongue every 5 (five) minutes as needed for chest pain. (Patient not taking: Reported on 04/16/2021) 30 tablet 12   OXYGEN Inhale 1 L/min into the lungs as needed.     pantoprazole (PROTONIX) 40 MG tablet Take 40 mg by mouth daily.      potassium chloride (KLOR-CON) 10 MEQ tablet TAKE 1 TABLET(10 MEQ) BY MOUTH DAILY 90 tablet 4   spironolactone (ALDACTONE) 25 MG tablet TAKE 1 TABLET(25 MG) BY MOUTH DAILY 90 tablet 1   triamcinolone cream (KENALOG) 0.1 % Apply topically 2 (two) times daily.     zolpidem (AMBIEN) 5 MG tablet TAKE 1 TABLET BY MOUTH AT BEDTIME AS NEEDED FOR SLEEP 31 tablet 5   No current facility-administered medications for this visit.    Allergies:   Patient has no known allergies.    Social History:  The patient  reports that she quit smoking about 19 years ago. Her smoking use included cigarettes. She started smoking about 42 years ago. She has a 46.00 pack-year smoking history. She has never used smokeless tobacco. She reports that she does not drink alcohol and does not use drugs.   Family History:  The patient's family history includes Asthma in her daughter; COPD in her maternal grandmother and mother; Cancer in her paternal grandfather; Cirrhosis in her paternal uncle; Colon polyps in her brother; Diabetes in her maternal grandfather; Lung cancer in her father; Other in her maternal aunt and mother; Throat cancer in an other family member.    ROS:   Please see the history of present illness. (+) Numbness in bilateral hands (+) Exertional shortness of breath (+) Palpitations (+) Bilateral LE edema All other  systems are reviewed and negative.    PHYSICAL EXAM: VS:  BP 108/66 (BP Location: Right Arm, Patient Position:  Sitting, Cuff Size: Large)   Pulse 67   Ht _0  (1.651 m)   Wt 176 lb 9.6 oz (80.1 kg)   SpO2 98%   BMI 29.39 kg/m  , BMI Body mass index is 29.39 kg/m. GENERAL:  Well appearing HEENT: Pupils equal round and reactive, fundi not visualized, oral mucosa unremarkable NECK:  No jugular venous distention, waveform within normal limits, carotid upstroke brisk and symmetric, no bruits LUNGS:  Clear to auscultation bilaterally HEART:  RRR.  PMI not displaced or sustained,S1 and S2 within normal limits, no S3, no S4, no clicks, no rubs, no murmurs ABD:  Flat, positive bowel sounds normal in frequency in pitch, no bruits, no rebound, no guarding, no midline pulsatile mass, no hepatomegaly, no splenomegaly EXT:  2 plus pulses throughout, no edema, no cyanosis no clubbing SKIN:  No rashes no nodules NEURO:  Cranial nerves II through XII grossly intact, motor grossly intact throughout PSYCH:  Cognitively intact, oriented to person place and time   EKG:   08/09/21: EKG was not ordered. 12/20/19: Sinus rhythm.  Rate 87 bpm.  First degree AV block. QTc 510 ms.  05/13/19: sinus rhythm rate 83 bpm.  First degree AV block.  Nonspecific ST changes. QTc 470 ms.   Echo 09/16/2018: Study Conclusions   - Left ventricle: The cavity size was normal. Wall thickness was   normal. Systolic function was vigorous. The estimated ejection   fraction was in the range of 65% to 70%. Doppler parameters are   consistent with abnormal left ventricular relaxation (grade 1   diastolic dysfunction).  CT Chest 12/16/2017: COMPARISON:  Radiographs of November 18, 2017. CT scan of April 30, 2017.   FINDINGS: Cardiovascular: Atherosclerosis of thoracic aorta is noted without aneurysm formation. Stent is noted in left main coronary artery. Normal cardiac size. No pericardial effusion is noted.    Mediastinum/Nodes: Small bilateral thyroid nodules are noted. Esophagus is unremarkable. No significant mediastinal adenopathy is noted.   Lungs/Pleura: No pneumothorax or pleural effusion is noted. Stable postoperative changes are noted in the right upper lobe with probable associated radiation fibrosis. No new pulmonary nodule or mass is noted. Stable reticular densities are noted throughout both lungs most consistent with chronic interstitial lung disease or scarring. Emphysematous disease is noted throughout both lungs which is stable.   Upper Abdomen: No acute abnormality.   Musculoskeletal: No chest wall mass or suspicious bone lesions identified.   IMPRESSION: Stable postoperative and post radiation changes are noted in the right upper lobe.   Stable reticular densities are noted throughout both lungs most consistent with chronic interstitial lung disease or scarring.   No significant pulmonary nodule or mass is noted.   Aortic Atherosclerosis (ICD10-I70.0) and Emphysema (ICD10-J43.9).  LHC 05/06/17: 60% ostial LM.  15% proximal RCA.  Recent Labs: 08/08/2021: ALT 13; BUN 17; Creatinine 1.02; Hemoglobin 12.8; Platelet Count 249; Potassium 4.0; Sodium 138    Lipid Panel    Component Value Date/Time   CHOL 122 12/20/2019 1111   TRIG 129 12/20/2019 1111   HDL 34 (L) 12/20/2019 1111   CHOLHDL 3.6 12/20/2019 1111   CHOLHDL 3.5 07/28/2018 1427   VLDL 19 07/28/2018 1427   LDLCALC 65 12/20/2019 1111   LDLDIRECT 137 (H) 09/27/2009 2057      Wt Readings from Last 3 Encounters:  08/09/21 176 lb 9.6 oz (80.1 kg)  08/08/21 174 lb 9.6 oz (79.2 kg)  04/16/21 176 lb 3.2 oz (79.9 kg)      ASSESSMENT  AND PLAN:  Chronic diastolic heart failure ([place]) She is euvolemic and doing well.  Volume is well-controlled on Lasix.  Blood pressures stable on carvedilol, losartan, and spironolactone.  Status post coronary artery stent placement She has ostial left main disease.  She  has done well clinically.  She has exertional dyspnea but no chest pain.  She does have chronic lung disease.  She feels that her breathing is getting worse when she tries to do strenuous activities.  We will get a Lexiscan Myoview to assess for ischemia.  Continue clopidogrel, atorvastatin, and carvedilol.  HYPERCHOLESTEROLEMIA Continue atorvastatin and Zetia.  Check fasting lipids and a CMP today.  Arm numbness She notes numbness in her left arm that happens mostly when laying in bed at night.  She has no exertional discomfort with her arm.  I do not hear any bruits.  She has good peripheral pulses.  This does not seem to be due to occlusive arterial disease.  I suspect that this is radicular discomfort.  It affects her whole arm and hand.  Recommend that she see her PCP and have evaluation of her neck.   Current medicines are reviewed at length with the patient today.  The patient does not have concerns regarding medicines.  The following changes have been made:  no change  Labs/ tests ordered today include:   Orders Placed This Encounter  Procedures   Lipid panel   Comprehensive metabolic panel   MYOCARDIAL PERFUSION IMAGING      Disposition:   FU with Lori Dewolfe C. Oval Linsey, MD, [facility] in 1 year  I,Mathew Stumpf,acting as a scribe for Skeet Latch, MD.,have documented all relevant documentation on the behalf of Skeet Latch, MD,as directed by  Skeet Latch, MD while in the presence of Skeet Latch, MD.  I, Lori Oval Linsey, MD have reviewed all documentation for this visit.  The documentation of the exam, diagnosis, procedures, and orders on 08/09/2021 are all accurate and complete.   Signed, Krisann Mckenna C. Oval Linsey, MD, [facility]  08/09/2021 9:49 AM    [facility]

## 2021-08-09 ENCOUNTER — Encounter (HOSPITAL_BASED_OUTPATIENT_CLINIC_OR_DEPARTMENT_OTHER): Payer: Self-pay | Admitting: Cardiovascular Disease

## 2021-08-09 ENCOUNTER — Ambulatory Visit (INDEPENDENT_AMBULATORY_CARE_PROVIDER_SITE_OTHER): Payer: Medicare Other | Admitting: Cardiovascular Disease

## 2021-08-09 VITALS — BP 108/66 | HR 67 | Ht 65.0 in | Wt 176.6 lb

## 2021-08-09 DIAGNOSIS — E7800 Pure hypercholesterolemia, unspecified: Secondary | ICD-10-CM

## 2021-08-09 DIAGNOSIS — Z955 Presence of coronary angioplasty implant and graft: Secondary | ICD-10-CM

## 2021-08-09 DIAGNOSIS — R200 Anesthesia of skin: Secondary | ICD-10-CM

## 2021-08-09 DIAGNOSIS — R0609 Other forms of dyspnea: Secondary | ICD-10-CM | POA: Diagnosis not present

## 2021-08-09 DIAGNOSIS — Z5181 Encounter for therapeutic drug level monitoring: Secondary | ICD-10-CM | POA: Diagnosis not present

## 2021-08-09 HISTORY — DX: Anesthesia of skin: R20.0

## 2021-08-09 LAB — COMPREHENSIVE METABOLIC PANEL
ALT: 12 IU/L (ref 0–32)
AST: 17 IU/L (ref 0–40)
Albumin/Globulin Ratio: 1.9 (ref 1.2–2.2)
Albumin: 4.9 g/dL (ref 3.8–4.9)
Alkaline Phosphatase: 116 IU/L (ref 44–121)
BUN/Creatinine Ratio: 16 (ref 9–23)
BUN: 17 mg/dL (ref 6–24)
Bilirubin Total: 0.6 mg/dL (ref 0.0–1.2)
CO2: 21 mmol/L (ref 20–29)
Calcium: 9.3 mg/dL (ref 8.7–10.2)
Chloride: 103 mmol/L (ref 96–106)
Creatinine, Ser: 1.05 mg/dL — ABNORMAL HIGH (ref 0.57–1.00)
Globulin, Total: 2.6 g/dL (ref 1.5–4.5)
Glucose: 91 mg/dL (ref 70–99)
Potassium: 4.4 mmol/L (ref 3.5–5.2)
Sodium: 138 mmol/L (ref 134–144)
Total Protein: 7.5 g/dL (ref 6.0–8.5)
eGFR: 62 mL/min/{1.73_m2} (ref >59)

## 2021-08-09 LAB — LIPID PANEL
Chol/HDL Ratio: 4.4 ratio (ref 0.0–4.4)
Cholesterol, Total: 135 mg/dL (ref 100–199)
HDL: 31 mg/dL — ABNORMAL LOW (ref >39)
LDL Chol Calc (NIH): 81 mg/dL (ref 0–99)
Triglycerides: 129 mg/dL (ref 0–149)
VLDL Cholesterol Cal: 23 mg/dL (ref 5–40)

## 2021-08-09 NOTE — Assessment & Plan Note (Signed)
She has ostial left main disease.  She has done well clinically.  She has exertional dyspnea but no chest pain.  She does have chronic lung disease.  She feels that her breathing is getting worse when she tries to do strenuous activities.  We will get a Lexiscan Myoview to assess for ischemia.  Continue clopidogrel, atorvastatin, and carvedilol.

## 2021-08-09 NOTE — Patient Instructions (Signed)
Medication Instructions:  Your physician recommends that you continue on your current medications as directed. Please refer to the Current Medication list given to you today.   *If you need a refill on your cardiac medications before your next appointment, please call your pharmacy*  Lab Work: LP/CMET TODAY    Testing/Procedures: Your physician has requested that you have a lexiscan myoview. For further information please visit [email or web address]. Please follow instruction sheet, as given.  Follow-Up: At [facility], you and your health needs are our priority.  As part of our continuing mission to provide you with exceptional heart care, we have created designated Provider Care Teams.  These Care Teams include your primary Cardiologist (physician) and Advanced Practice Providers (APPs -  Physician Assistants and Nurse Practitioners) who all work together to provide you with the care you need, when you need it.  We recommend signing up for the patient portal called "MyChart".  Sign up information is provided on this After Visit Summary.  MyChart is used to connect with patients for Virtual Visits (Telemedicine).  Patients are able to view lab/test results, encounter notes, upcoming appointments, etc.  Non-urgent messages can be sent to your provider as well.   To learn more about what you can do with MyChart, go to [email or web address].    Your next appointment:   12 month(s)  The format for your next appointment:   In Person  Provider:   Skeet Latch, MD

## 2021-08-09 NOTE — Assessment & Plan Note (Signed)
Continue atorvastatin and Zetia.  Check fasting lipids and a CMP today.

## 2021-08-09 NOTE — Assessment & Plan Note (Signed)
She notes numbness in her left arm that happens mostly when laying in bed at night.  She has no exertional discomfort with her arm.  I do not hear any bruits.  She has good peripheral pulses.  This does not seem to be due to occlusive arterial disease.  I suspect that this is radicular discomfort.  It affects her whole arm and hand.  Recommend that she see her PCP and have evaluation of her neck.

## 2021-08-09 NOTE — Assessment & Plan Note (Signed)
She is euvolemic and doing well.  Volume is well-controlled on Lasix.  Blood pressures stable on carvedilol, losartan, and spironolactone.

## 2021-08-14 ENCOUNTER — Telehealth (HOSPITAL_COMMUNITY): Payer: Self-pay | Admitting: *Deleted

## 2021-08-14 NOTE — Telephone Encounter (Signed)
Left message on voicemail per DPR in reference to upcoming appointment scheduled on 08/21/21 with detailed instructions given per Myocardial Perfusion Study Information Sheet for the test. LM to arrive 15 minutes early, and that it is imperative to arrive on time for appointment to keep from having the test rescheduled. If you need to cancel or reschedule your appointment, please call the office within 24 hours of your appointment. Failure to do so may result in a cancellation of your appointment, and a $50 no show fee. Phone number given for call back for any questions. Kirstie Peri

## 2021-08-21 ENCOUNTER — Encounter (HOSPITAL_COMMUNITY): Payer: Medicare Other

## 2021-08-21 ENCOUNTER — Telehealth (HOSPITAL_COMMUNITY): Payer: Self-pay | Admitting: *Deleted

## 2021-08-21 ENCOUNTER — Encounter (HOSPITAL_COMMUNITY): Payer: Self-pay | Admitting: *Deleted

## 2021-08-21 NOTE — Telephone Encounter (Signed)
Left message on voicemail per DPR in reference to upcoming appointment scheduled on 08/28/21 at 1045 with detailed instructions given per Myocardial Perfusion Study Information Sheet for the test. LM to arrive 15 minutes early, and that it is imperative to arrive on time for appointment to keep from having the test rescheduled. If you need to cancel or reschedule your appointment, please call the office within 24 hours of your appointment. Failure to do so may result in a cancellation of your appointment, and a $50 no show fee. Phone number given for call back for any questions.  Mychart letter sent.Vashon Riordan, [place]anae [place]

## 2021-08-28 ENCOUNTER — Ambulatory Visit (HOSPITAL_COMMUNITY): Payer: Medicare Other | Attending: Cardiovascular Disease

## 2021-08-28 ENCOUNTER — Other Ambulatory Visit: Payer: Self-pay

## 2021-08-28 DIAGNOSIS — R0609 Other forms of dyspnea: Secondary | ICD-10-CM | POA: Diagnosis present

## 2021-08-28 LAB — MYOCARDIAL PERFUSION IMAGING
LV dias vol: 61 mL (ref 46–106)
LV sys vol: 23 mL
Nuc Stress EF: 62 %
Peak HR: 106 {beats}/min
Rest HR: 85 {beats}/min
Rest Nuclear Isotope Dose: 10.2 mCi
SDS: 1
SRS: 0
SSS: 1
ST Depression (mm): 0 mm
Stress Nuclear Isotope Dose: 30.6 mCi
TID: 0.97

## 2021-08-28 MED ORDER — TECHNETIUM TC 99M TETROFOSMIN IV KIT
30.6000 | PACK | Freq: Once | INTRAVENOUS | Status: AC | PRN
  Administered 2021-08-28: 12:00:00 30.6 via INTRAVENOUS
  Filled 2021-08-28: qty 31

## 2021-08-28 MED ORDER — TECHNETIUM TC 99M TETROFOSMIN IV KIT
10.2000 | PACK | Freq: Once | INTRAVENOUS | Status: AC | PRN
  Administered 2021-08-28: 11:00:00 10.2 via INTRAVENOUS
  Filled 2021-08-28: qty 11

## 2021-08-28 MED ORDER — REGADENOSON 0.4 MG/5ML IV SOLN
0.4000 mg | Freq: Once | INTRAVENOUS | Status: AC
  Administered 2021-08-28: 12:00:00 0.4 mg via INTRAVENOUS

## 2021-09-04 ENCOUNTER — Telehealth: Payer: Self-pay | Admitting: Internal Medicine

## 2021-09-05 ENCOUNTER — Encounter: Payer: Self-pay | Admitting: Internal Medicine

## 2021-09-05 MED ORDER — BREZTRI AEROSPHERE 160-9-4.8 MCG/ACT IN AERO: 2.0000 | INHALATION_SPRAY | Freq: Two times a day (BID) | RESPIRATORY_TRACT | 1 refills | Status: DC

## 2021-09-05 NOTE — Assessment & Plan Note (Signed)
Currently near her baseline.  Her inhalers do help and are continued, refilling Breztri and albuterol HFA

## 2021-09-05 NOTE — Telephone Encounter (Signed)
Called and spoke with patient. She stated that she normally gets 3 Breztri inhalers at once but the pharmacy refused to give her 3 inhalers. I reviewed her chart and the RX that was sent back in August 2022 was for just one inhaler per month. I have resent the RX to [facility] for her.   Nothing further needed at time of call.

## 2021-09-05 NOTE — Assessment & Plan Note (Signed)
Stable oxygen dependence.  Usually needs 3 L.

## 2021-10-01 ENCOUNTER — Telehealth (HOSPITAL_BASED_OUTPATIENT_CLINIC_OR_DEPARTMENT_OTHER): Payer: Self-pay | Admitting: *Deleted

## 2021-10-01 DIAGNOSIS — E7800 Pure hypercholesterolemia, unspecified: Secondary | ICD-10-CM

## 2021-10-01 DIAGNOSIS — Z5181 Encounter for therapeutic drug level monitoring: Secondary | ICD-10-CM

## 2021-10-01 MED ORDER — ATORVASTATIN CALCIUM 80 MG PO TABS: 80.0000 mg | ORAL_TABLET | Freq: Every day | ORAL | 3 refills | Status: DC

## 2021-10-01 NOTE — Telephone Encounter (Signed)
Advised patient of lab results  Labs orders mailed to patient and Rx sent to pharmacy

## 2021-10-01 NOTE — Telephone Encounter (Signed)
-----   Message from Skeet Latch, MD sent at 10/01/2021  1:21 PM EST ----- Kidney function and liver function are stable.  Her cholesterol levels are not bad, but her LDL should be less than 70 given her CAD history.  Recommend increasing atorvastatin to 80 mg and repeating lipids and a CMP in 2 months.

## 2021-10-05 ENCOUNTER — Other Ambulatory Visit: Payer: Self-pay | Admitting: Internal Medicine

## 2021-10-08 NOTE — Telephone Encounter (Signed)
Please advise patient requesting refill °

## 2021-10-08 NOTE — Telephone Encounter (Signed)
Ambien refilled

## 2021-10-17 ENCOUNTER — Ambulatory Visit: Payer: Medicare Other | Admitting: Internal Medicine

## 2021-10-24 ENCOUNTER — Emergency Department (HOSPITAL_BASED_OUTPATIENT_CLINIC_OR_DEPARTMENT_OTHER)
Admission: EM | Admit: 2021-10-24 | Discharge: 2021-10-24 | Disposition: A | Discharge status: Home / Self Care | Payer: Medicare Other | Attending: Emergency Medicine | Admitting: Emergency Medicine

## 2021-10-24 ENCOUNTER — Other Ambulatory Visit: Payer: Self-pay

## 2021-10-24 ENCOUNTER — Telehealth: Payer: Self-pay | Admitting: Internal Medicine

## 2021-10-24 DIAGNOSIS — R059 Cough, unspecified: Secondary | ICD-10-CM | POA: Diagnosis present

## 2021-10-24 DIAGNOSIS — Z5321 Procedure and treatment not carried out due to patient leaving prior to being seen by health care provider: Secondary | ICD-10-CM | POA: Diagnosis not present

## 2021-10-24 DIAGNOSIS — R509 Fever, unspecified: Secondary | ICD-10-CM | POA: Insufficient documentation

## 2021-10-24 NOTE — Telephone Encounter (Signed)
Lm x1 for patient.  

## 2021-10-24 NOTE — Telephone Encounter (Signed)
Called and requests clinic visit tomorrow, we'll test for flu. She's planning to pick up the azithromycin and steroids. Counseled to head to ED if she's feeling much worse at all.

## 2021-10-24 NOTE — Telephone Encounter (Signed)
Patient is returning phone call. Patient phone number is [phone].

## 2021-10-24 NOTE — ED Notes (Signed)
Pt stated she didn't want to wait in the lobby and left.

## 2021-10-24 NOTE — Progress Notes (Signed)
Synopsis: Referred for possible AECOPD by Donnajean Lopes, MD  Subjective:   PATIENT ID: [name] GENDER: female DOB: [birth year], MRN: [id number]  Chief Complaint  Patient presents with   Acute Visit    Increased SOB, chills, sweats, fever, aches, fatigue and cough with green/brown sputum, occ blood tinged- onset 10/22/21. She states temp as high as 104. Covid test at home was neg.    55yF with history of CHF, chronic hypoxemic respiratory failure on 3L O2, CAD status post PCI, severe COPD, lung cancer in remission (RUL wedge resection, XRT, and chemo, iressa till 2009), ?[facility], colon cancer status post resection, and melanoma who is seen with concern for URI/LRTI  Last seen in our clinic 04/16/21 at which point continued on breztri.  Had [place] 08/28/21, low risk study.   Still using breztri, albuterol. Dyspnea, fever, cough with green/brown sputum. High fever 104 a couple days ago.   Otherwise pertinent review of systems is negative.  Past Medical History:  Diagnosis Date   Acute bilateral low back pain without sciatica    Acute congestive heart failure ([place]) 05/02/2017   EF 20-25% on ECHO   Acute respiratory failure with hypoxia (HCC) 05/01/2017   Adhesive capsulitis 05/22/2011   Allergic rhinitis    Allergy    Anxiety disorder    Aortic valve regurgitation 05/15/2017   Arm numbness 08/09/2021   Asthma    Chronic bronchitis    Chronic diastolic heart failure ([place]) 12/22/2019   Colon cancer ([place])    adenocarcinoma in a polyps 11-24-2014   COPD (chronic obstructive pulmonary disease) (HCC)    Cough    Dizziness and giddiness    Dyspepsia    Dyspnea 04/30/2017   Emphysema of lung [facility])    External hemorrhoids    EXTERNAL HEMORRHOIDS 09/27/2009   Female stress incontinence    Genetic testing 08/18/2016   Negative for known pathogenic mutations within any of 25 genes on a Custom Panel through [employer].  One variant of uncertain significance (VUS) called  "c.7187C>G (p.Thr2396Ser)" was found in one copy of the ATM gene.  This Custom Cancer Panel offered by [employer] includes sequencing and/or duplication/deletion testing of the following 25 genes: APC, ATM, AXIN2, BAP1, BMPR1A, BRCA1, BRCA2, CDH1, CDK4, CDKN2A, CHEK2, EPCAM, MITF, MLH1, MSH2, MSH6, MUTYH, PMS2, POLD1, POLE, PTEN, SCG5/GREM1, SMAD4, STK11, and TP53. Date of report is July 23, 2016.  MSH2 Exons 1-7 Inversion Analysis was also negative through [employer].  Date of report is July 23, 2016.      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 NPSG was normal years ago.     Swelling of right lower extremity 12/01/2014     Family History  Problem Relation Age of Onset   Lung cancer Father        smoker and worked at a Academic librarian  COPD Mother        not a smoker   Other Mother        hx of hysterectomy for unspecified reason   Asthma Daughter    Diabetes Maternal Grandfather    Colon polyps Brother        approx 16 polyps on his first colonoscopy   Other Maternal Aunt        non-cancerous growth in lungs; respiratory issues; smoker   COPD Maternal Grandmother        d. 78   Cancer Paternal Grandfather        oral/mouth cancer, chewed tobacco, dx at older age   Throat cancer Other        maternal great aunt (MGF's sister); not a smoker   Cirrhosis Paternal Uncle        hx of alcohol abuse    Colon cancer Neg Hx    Rectal cancer Neg Hx     Stomach cancer Neg Hx      Past Surgical History:  Procedure Laterality Date   ABDOMINAL HYSTERECTOMY  2001   no BSO   biopsy of right neck mass     negative-thyroid biopsy    BREAST EXCISIONAL BIOPSY Right 1987   no visible scar   BRONCHOSCOPY  01/31/2005   nonspecific inflammation   BRONCHOSCOPY  08/24/2010   nonspecific inflammation   COLONOSCOPY     COLONOSCOPY WITH PROPOFOL N/A 02/07/2020   Procedure: COLONOSCOPY WITH PROPOFOL;  Surgeon: Yetta Flock, MD;  Location: [facility];  Service: Gastroenterology;  Laterality: N/A;   CORONARY STENT INTERVENTION N/A 05/12/2017   Procedure: CORONARY STENT INTERVENTION;  Surgeon: Charolette Forward, MD;  Location: [place];  Service: Cardiovascular;  Laterality: N/A;   LUNG REMOVAL, PARTIAL     for lung cancer--followed by Dr. Arlyce Dice and Dr. Annamaria Boots   MELANOMA EXCISION WITH SENTINEL LYMPH NODE BIOPSY Right 11/29/2014   Procedure: RIGHT INGUINAL SENTINEL LYMPH NODE BIOPSY;  Surgeon: Georganna Skeans, MD;  Location: [facility];  Service: General;  Laterality: Right;   melanoma removal     right calf    POLYPECTOMY     POLYPECTOMY  02/07/2020   Procedure: POLYPECTOMY;  Surgeon: Yetta Flock, MD;  Location: [facility];  Service: Gastroenterology;;   portacath removed  10/2007   RIGHT/LEFT HEART CATH AND CORONARY ANGIOGRAPHY N/A 05/06/2017   Procedure: RIGHT/LEFT HEART CATH AND CORONARY ANGIOGRAPHY;  Surgeon: [name], MD;  Location: [place];  Service: Cardiovascular;  Laterality: N/A;   SPIROMETRY  07/03/2002   min obstruction,mild restriction 01/31/2005   tvh  12/01/2000   hysterectomy    Social History   Socioeconomic History   Marital status: Widowed    Spouse name: Not on file   Number of children: 1   Years of education: 10   Highest education level: Not on file  Occupational History   Occupation: Disabled- Cytogeneticist: UNEMPLOYED  Tobacco Use   Smoking status: Former    Packs/day: 2.00     Years: 23.00    Pack years: 46.00    Types: Cigarettes    Start date: 09/02/1978    Quit date: 10/26/2001    Years since quitting: 20.0   Smokeless tobacco: Never   Tobacco comments:    Significant passive exposure from boy friend  Vaping Use   Vaping Use: Never used  Substance and Sexual Activity   Alcohol use: No    Alcohol/week: 0.0 standard drinks  Comment: history of alcohol abuse 20-30   Drug use: No    Comment: +MJ   Sexual activity: Not on file  Other Topics Concern   Not on file  Social History Narrative   Health Care POA:    Emergency Contact:    End of Life Plan:    Who lives with you: self   Any pets: cat, Bart   Diet: Pt has a varied diet but reports not eating much because she is afraid of weight gain.   Exercise: Pt has no regular exercise routine.   Seatbelts: Pt reports wearing seatbelt when in vehicles.    Hobbies: movies         Social Determinants of Radio broadcast assistant Strain: Not on file  Food Insecurity: Not on file  Transportation Needs: Not on file  Physical Activity: Not on file  Stress: Not on file  Social Connections: Not on file  Intimate Partner Violence: Not on file     No Known Allergies   Outpatient Medications Prior to Visit  Medication Sig Dispense Refill   acetaminophen (TYLENOL) 500 MG tablet Take 500 mg by mouth every 8 (eight) hours as needed for moderate pain.     acyclovir (ZOVIRAX) 800 MG tablet Take 800 mg by mouth 2 (two) times daily.     albuterol (VENTOLIN HFA) 108 (90 Base) MCG/ACT inhaler INHALE 2 PUFFS INTO THE LUNGS EVERY 4-6 HOURS AS NEEDED FOR WHEEZING AND SHORTNESS OF BREATH 54 g 4   ALPRAZolam (XANAX) 0.5 MG tablet Take 1 tablet (0.5 mg total) by mouth 2 (two) times daily as needed for up to 1 dose for anxiety. (Patient taking differently: Take 0.5 mg by mouth 2 (two) times daily.) 40 tablet 0   atorvastatin (LIPITOR) 80 MG tablet Take 1 tablet (80 mg total) by mouth daily. 90 tablet 3    Budeson-Glycopyrrol-Formoterol (BREZTRI AEROSPHERE) 160-9-4.8 MCG/ACT AERO Inhale 2 puffs into the lungs in the morning and at bedtime. 32.1 g 1   carvedilol (COREG) 6.25 MG tablet TAKE 1 TABLET(6.25 MG) BY MOUTH TWICE DAILY WITH A MEAL 180 tablet 3   clopidogrel (PLAVIX) 75 MG tablet Take 1 tablet daily 90 tablet 3   ezetimibe (ZETIA) 10 MG tablet Take 10 mg by mouth daily.     furosemide (LASIX) 20 MG tablet Take 1 tablet (20 mg total) by mouth daily. 90 tablet 3   ipratropium (ATROVENT) 0.06 % nasal spray Place 1 spray into both nostrils 3 (three) times daily.     ipratropium-albuterol (DUONEB) 0.5-2.5 (3) MG/3ML SOLN USE 1 VIAL VIA NEBULIZER EVERY 6 HOURS AS NEEDED 90 mL 3   losartan (COZAAR) 25 MG tablet TAKE 1 TABLET(25 MG) BY MOUTH DAILY 90 tablet 2   nitroGLYCERIN (NITROSTAT) 0.4 MG SL tablet Place 1 tablet (0.4 mg total) under the tongue every 5 (five) minutes as needed for chest pain. 30 tablet 12   OXYGEN Inhale 1 L/min into the lungs as needed.     pantoprazole (PROTONIX) 40 MG tablet Take 40 mg by mouth daily.      potassium chloride (KLOR-CON) 10 MEQ tablet TAKE 1 TABLET(10 MEQ) BY MOUTH DAILY 90 tablet 4   spironolactone (ALDACTONE) 25 MG tablet TAKE 1 TABLET(25 MG) BY MOUTH DAILY 90 tablet 1   triamcinolone cream (KENALOG) 0.1 % Apply topically 2 (two) times daily.     zolpidem (AMBIEN) 5 MG tablet TAKE 1 TABLET BY MOUTH AT BEDTIME AS NEEDED FOR SLEEP 31 tablet 5  No facility-administered medications prior to visit.       Objective:   Physical Exam:  General appearance: 56 y.o., female, NAD, conversant  Eyes: sclerae injected; PERRL, tracking appropriately HENT: NCAT; MMM, +tenderness over maxillary sinuses, no meningismus Neck: Trachea midline; no lymphadenopathy, no JVD Lungs: diminished bl, no crackles, no wheeze, with normal respiratory effort CV: RRR, no murmur  Abdomen: Soft, non-tender; non-distended, BS present  Extremities: No peripheral edema, warm Skin:  Normal turgor and texture; no rash Psych: Appropriate affect Neuro: Alert and oriented to person and place, no focal deficit     Vitals:   10/25/21 0834  BP: 98/60  Pulse: (!) 102  Temp: 99.1 F (37.3 C)  TempSrc: Oral  SpO2: 92%  Weight: 172 lb (78 kg)  Height: 5' 5"  (1.651 m)   92% on RA BMI Readings from Last 3 Encounters:  10/25/21 28.62 kg/m  08/28/21 29.29 kg/m  08/09/21 29.39 kg/m   Wt Readings from Last 3 Encounters:  10/25/21 172 lb (78 kg)  08/28/21 176 lb (79.8 kg)  08/09/21 176 lb 9.6 oz (80.1 kg)     CBC    Component Value Date/Time   WBC 6.2 08/08/2021 1005   WBC 8.1 10/16/2020 1113   RBC 4.51 08/08/2021 1005   HGB 12.8 08/08/2021 1005   HGB 13.3 06/20/2017 1234   HCT 37.4 08/08/2021 1005   HCT 38.3 06/20/2017 1234   PLT 249 08/08/2021 1005   PLT 223 06/20/2017 1234   MCV 82.9 08/08/2021 1005   MCV 84.9 06/20/2017 1234   MCH 28.4 08/08/2021 1005   MCHC 34.2 08/08/2021 1005   RDW 13.1 08/08/2021 1005   RDW 14.1 06/20/2017 1234   LYMPHSABS 0.6 (L) 08/08/2021 1005   LYMPHSABS 1.3 06/20/2017 1234   MONOABS 0.5 08/08/2021 1005   MONOABS 0.8 06/20/2017 1234   EOSABS 0.1 08/08/2021 1005   EOSABS 0.1 06/20/2017 1234   BASOSABS 0.0 08/08/2021 1005   BASOSABS 0.0 06/20/2017 1234   Flu, covid negative  Chest Imaging: CXR reviewed by me today stable, no pneumonia  Pulmonary Functions Testing Results: PFT Results Latest Ref Rng & Units 06/17/2017 05/08/2017  FVC-Pre L 1.58 1.51  FVC-Predicted Pre % 43 41  FVC-Post L 1.73 1.67  FVC-Predicted Post % 47 46  Pre FEV1/FVC % % 62 60  Post FEV1/FCV % % 64 61  FEV1-Pre L 0.98 0.91  FEV1-Predicted Pre % 34 32  FEV1-Post L 1.10 1.02  DLCO uncorrected ml/min/mmHg 8.22 -  DLCO UNC% % 32 -  DLCO corrected ml/min/mmHg 8.41 -  DLCO COR %Predicted % 32 -  DLVA Predicted % 60 -  TLC L 4.03 -  TLC % Predicted % 77 -  RV % Predicted % 122 -  Reviewed by me with mixed severe obstruction and restriction  with borderline BD response, severely reduced diffusing capacity   Echocardiogram:   TTE 2020 EF 65-70%, G1DD     Assessment & Plan:   # Likely viral URI with or without acute bacterial sinusitis # acute exacerbation of COPD  Plan: - finish prednisone taper already prescribed by PCP - augmentin 1 tablet BID for 7d course - call or send message if failing to improve by end of course. ED precautions given for dyspnea refractory to a couple breathing treatments at home.  - fever control with tylenol  RTC as already scheduled with Dr. Jerelene Redden, MD [facility] 10/25/2021 9:13 AM

## 2021-10-24 NOTE — Telephone Encounter (Signed)
Spoke to patient.  She is concerned that she has the flu.  C/o temp as high as 104.9, prod cough with brown sputum, weakness, increased sob with exertion, chills, sweats and diarrhea x2d.    She has not tested for flu.  Negative covid test yesterday. She wears 2L QHS and PRN. Spo2 is maintaining around 92%  She is using albuterol BID, Breztri and Tylenol Q4H.  She stated that PCP is currently closed due to broken water line. She called on call provider and was prescribed prednisone taper and 3 days of azithromycin.  Dr. Verlee Monte, please advise. Dr. Annamaria Boots is unavailable.

## 2021-10-25 ENCOUNTER — Ambulatory Visit (INDEPENDENT_AMBULATORY_CARE_PROVIDER_SITE_OTHER): Payer: Medicare Other

## 2021-10-25 ENCOUNTER — Ambulatory Visit (INDEPENDENT_AMBULATORY_CARE_PROVIDER_SITE_OTHER): Payer: Medicare Other | Admitting: Student

## 2021-10-25 ENCOUNTER — Encounter: Payer: Self-pay | Admitting: Student

## 2021-10-25 VITALS — BP 98/60 | HR 102 | Temp 99.1°F | Ht 65.0 in | Wt 172.0 lb

## 2021-10-25 DIAGNOSIS — J449 Chronic obstructive pulmonary disease, unspecified: Secondary | ICD-10-CM

## 2021-10-25 DIAGNOSIS — R059 Cough, unspecified: Secondary | ICD-10-CM

## 2021-10-25 DIAGNOSIS — J441 Chronic obstructive pulmonary disease with (acute) exacerbation: Secondary | ICD-10-CM

## 2021-10-25 DIAGNOSIS — J0100 Acute maxillary sinusitis, unspecified: Secondary | ICD-10-CM

## 2021-10-25 MED ORDER — AMOXICILLIN-POT CLAVULANATE 875-125 MG PO TABS: 1.0000 | ORAL_TABLET | Freq: Two times a day (BID) | ORAL | 0 refills | Status: DC

## 2021-10-25 NOTE — Patient Instructions (Signed)
-   Take 1 week course augmentin 1 tablet twice daily - finish prednisone taper - if not feeling well by end of it give [place] a call or send message - fever control with tylenol

## 2021-10-26 LAB — POCT INFLUENZA A/B
Influenza A, POC: NEGATIVE
Influenza B, POC: NEGATIVE

## 2021-10-26 LAB — POC COVID19 BINAXNOW: SARS Coronavirus 2 Ag: NEGATIVE

## 2021-10-26 NOTE — Addendum Note (Signed)
Addended by: Rosana Berger on: 10/26/2021 09:42 AM   Modules accepted: Orders

## 2021-10-29 ENCOUNTER — Ambulatory Visit: Payer: Medicare Other | Admitting: Internal Medicine

## 2021-12-02 NOTE — Progress Notes (Signed)
?   Patient ID: [name], female    DOB: Jul 31, 1966, 56 y.o.   MRN: [id number] ? 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 ?Walk test on room air 06/28/2016-95%, 95%, 95%, 93%, peak heart rate 120/minute. No desaturation after 3?185 feet. ?Office Spirometry 06/28/2016-limited validity due to cough. Restriction of exhaled volume. FVC 1.79/50%, FEV1 1.64/57%, ratio 0.80. ?FENO- 5 ?Walk Test on room Air- Qualified for home O2 04/10/17 ?PFT 06/17/17- severe obstruction, mild restriction, diffusion severely reduced. Insignificant response to bronchodilator. FVC 1.73/47%, FEV1 1.10/38%, ratio 0.64, TLC 77%, DLCO 32% ?CHF- acute hosp 05/2017- CHF, CAD, EF 20-25%. Stent. Added Plavix, spironolactone, Coreg, Cozaar ?Walk test 02/10/2019- desat to 88%, on 2L o2 was 94% ?Echocardiogram 09/16/18- DD, EF 65-70% ? ?------------------------------------------------------------------------------------------------------------ ? ? 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 ?Feels  near baseline now.  Occasionally uses nebulizer machine.  Some routine cough and clear phlegm. ?CXR 10/06/20- ?IMPRESSION: ?Stable postoperative in apparent post radiation therapy change right ?upper lobe with areas of scarring and retraction. Asymmetric pleural ?thickening right apex is stable. No new opacity evident. Stable ?cardiac silhouette. Aortic Atherosclerosis (ICD10-I70.0). ? 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 She will discuss with PCP. ?Mucinex helps if wheezing. Continues her bronchodilators. No recent infection or acute concern. ?CXR 10/25/21- ?FINDINGS: ?Stable right paratracheal soft tissue density with surgical clips ?and volume loss and hilar retraction. No new infiltrate or nodule. ?IMPRESSION: ?Stable right upper lobe parenchymal changes as above. No acute ?findings. ? ? ?Review of Systems-+ = positive ?Constitutional:   No-   weight loss, night sweats, fevers, chills, fatigue, lassitude. ?HEENT:   No-  headaches, difficulty swallowing, tooth/dental problems, sore throat,  ?    mild sneezing,no- itching, ear ache, nasal congestion, post nasal drip,  ?CV:  No-   chest pain, orthopnea, PND, swelling in lower extremities, anasarca, dizziness, palpitations ?Resp: + shortness of breath with exertion or at rest.   ?           +productive cough, + non-productive cough,  No- coughing up of blood.   ?            change in color of mucus. +  wheezing.   ?Skin: No-  rash or lesions. ?GI:  +HPI ?GU:   ?MS:  No-   joint pain or swelling.   ?Neuro-     nothing unusual ?Psych:  No- change in mood or affect. No depression or anxiety.  No memory loss.   ?Objective:  ? Physical Exam   on room air today-arrival sat 96% ?General- Alert, Oriented, Affect-appropriate, Distress - NAD, + overweight   ?Skin- rash-none, lesions- none, excoriation- none.  ?Lymphadenopathy- none ?Head- atraumatic ?           Eyes- Gross vision intact, PERRLA, conjunctivae clear secretions ?           Ears- Hearing, canals-normal ?           Nose- , no-Septal dev, mucus, polyps, erosion, perforation  ?           Throat- Mallampati IVI , mucosa clear , drainage- none, tonsils- atrophic,  ?Neck- flexible , trachea midline, no stridor , thyroid nl, carotid no bruit ?Chest - symmetrical excursion , unlabored ?          Heart/CV- RRR rapid , no murmur , no gallop  , no rub, nl s1 s2 ?                          - JVD none , edema- none, stasis changes- none, varices- none ?          Lung-   Clear/diminished, Wheeze-none, cough-None, dullness-none, rub- none. Rales- none ?          Chest wall-  ?Abd-  ?Br/ Gen/ Rectal- Not done, not indicated ?Extrem- cyanosis- none, clubbing, none, atrophy- none, strength- nl,    ?Neuro- grossly intact to observation ? ? ?   ? ? ? ? ?

## 2021-12-04 ENCOUNTER — Other Ambulatory Visit: Payer: Self-pay | Admitting: Cardiovascular Disease

## 2021-12-04 ENCOUNTER — Encounter: Payer: Self-pay | Admitting: Internal Medicine

## 2021-12-04 ENCOUNTER — Ambulatory Visit (INDEPENDENT_AMBULATORY_CARE_PROVIDER_SITE_OTHER): Payer: Medicare Other | Admitting: Internal Medicine

## 2021-12-04 DIAGNOSIS — J449 Chronic obstructive pulmonary disease, unspecified: Secondary | ICD-10-CM

## 2021-12-04 DIAGNOSIS — J9611 Chronic respiratory failure with hypoxia: Secondary | ICD-10-CM

## 2021-12-04 MED ORDER — ALBUTEROL SULFATE HFA 108 (90 BASE) MCG/ACT IN AERS: INHALATION_SPRAY | RESPIRATORY_TRACT | 4 refills | Status: DC

## 2021-12-04 MED ORDER — BREZTRI AEROSPHERE 160-9-4.8 MCG/ACT IN AERO: 2.0000 | INHALATION_SPRAY | Freq: Two times a day (BID) | RESPIRATORY_TRACT | 3 refills | Status: DC

## 2021-12-04 MED ORDER — ZOLPIDEM TARTRATE 5 MG PO TABS: 5.0000 mg | ORAL_TABLET | Freq: Every evening | ORAL | 5 refills | Status: DC | PRN

## 2021-12-04 MED ORDER — IPRATROPIUM-ALBUTEROL 0.5-2.5 (3) MG/3ML IN SOLN
RESPIRATORY_TRACT | 3 refills | Status: DC
Start: 2021-12-04 — End: 2023-02-14

## 2021-12-04 NOTE — Patient Instructions (Signed)
Meds refilled  Please call if we can help 

## 2021-12-20 ENCOUNTER — Telehealth: Payer: Self-pay | Admitting: Cardiovascular Disease

## 2021-12-20 NOTE — Addendum Note (Signed)
Addended by: Alvina Filbert B on: 12/20/2021 02:56 PM ? ? Modules accepted: Orders ? ?

## 2021-12-20 NOTE — Telephone Encounter (Signed)
Spoke with patient and she has not been checking her blood pressure at home ?Dizziness and lightheadedness worse when bends over/goes from sitting to standing  ?She is currently not at home, she will check blood pressure when gets home around 1 ?Advised I would call back around 230 ?

## 2021-12-20 NOTE — Telephone Encounter (Signed)
Pt c/o medication issue: ? ?1. Name of Medication:  ?losartan (COZAAR) 25 MG tablet ? ?2. How are you currently taking this medication (dosage and times per day)?  ?As prescribed, 1 25 MG tablet daily by mouth  ?3. Are you having a reaction (difficulty breathing--STAT)?  ? ?4. What is your medication issue?  ? ?Patient states Losartan is causing massive headaches and extreme dizziness when standing. She states this is has been going on for months, but it is becoming progressively worse. ?

## 2021-12-20 NOTE — Telephone Encounter (Signed)
Spoke with patient and blood pressure 81/61 about an hour ago, no 95/64 HR 95 ? ?Will forward to Threasa Beards D for review  ?

## 2021-12-20 NOTE — Telephone Encounter (Signed)
Advised patient, verbalized understanding  ?Patient will check blood pressure daily and call back if SBP not consistently above 100  ?

## 2021-12-20 NOTE — Telephone Encounter (Signed)
RN returned call to patient. She states she has received a call from our office, patient states she thought she had been checking her BP but had been using a pulse ox. She states that someone is suppsoed to follow up with her today at 2:30pm  ? ? ?Patient spoke with Alvina Filbert who is going to follow up with her this PM.  ?

## 2021-12-20 NOTE — Telephone Encounter (Signed)
Has she been checking her blood pressure?  Is it increasing or decreasing rapidly? ?

## 2021-12-20 NOTE — Telephone Encounter (Signed)
Recommend reducing losartan to 1/2 tablet once daily ?

## 2021-12-20 NOTE — Addendum Note (Signed)
Addended by: Alvina Filbert B on: 12/20/2021 05:24 PM ? ? Modules accepted: Orders ? ?

## 2021-12-20 NOTE — Telephone Encounter (Signed)
Please advise 

## 2021-12-31 ENCOUNTER — Encounter: Payer: Self-pay | Admitting: Internal Medicine

## 2021-12-31 NOTE — Assessment & Plan Note (Signed)
No acute change.  ?Plan- continue current meds ?

## 2021-12-31 NOTE — Assessment & Plan Note (Signed)
We discussed indication for O2. ?Consider when to reassess- at least overnight oximetry ?

## 2022-01-30 ENCOUNTER — Telehealth: Payer: Self-pay | Admitting: Hematology

## 2022-01-30 NOTE — Telephone Encounter (Signed)
Rescheduled upcoming appointment due to provider's PAL. Patient opted to wait until November for appointment. Patient is aware of changes.

## 2022-02-07 ENCOUNTER — Ambulatory Visit: Payer: Medicare Other | Admitting: Hematology

## 2022-02-07 ENCOUNTER — Other Ambulatory Visit: Payer: Medicare Other

## 2022-03-09 ENCOUNTER — Other Ambulatory Visit: Payer: Self-pay | Admitting: Cardiovascular Disease

## 2022-03-11 NOTE — Telephone Encounter (Signed)
Rx request sent to pharmacy.  

## 2022-03-12 ENCOUNTER — Other Ambulatory Visit: Payer: Self-pay | Admitting: Internal Medicine

## 2022-03-14 ENCOUNTER — Telehealth: Payer: Self-pay | Admitting: Internal Medicine

## 2022-03-14 NOTE — Telephone Encounter (Signed)
Called patient and she is wanting to be excused from jury duty due to her COPD. Patietn states that she is unable to walk all the way to the court house from the parking garage. And she also states that even to drive up to the court house it is a lot of stairs for her to take and she can not do it.   Jury summons in for August 30th   Please advise sir

## 2022-03-14 NOTE — Telephone Encounter (Signed)
She needs to get [place] a copy of the jury summons letter with her juror number, courthouse, date of service.

## 2022-03-15 NOTE — Telephone Encounter (Signed)
Patient is aware that she needs to bring in [employer]. Nothing further needed

## 2022-03-18 ENCOUNTER — Encounter: Payer: Self-pay | Admitting: Internal Medicine

## 2022-03-18 ENCOUNTER — Telehealth: Payer: Self-pay | Admitting: *Deleted

## 2022-03-18 ENCOUNTER — Telehealth: Payer: Self-pay | Admitting: Internal Medicine

## 2022-03-18 NOTE — Telephone Encounter (Signed)
Done and patient notified.

## 2022-03-18 NOTE — Telephone Encounter (Signed)
Called and spoke with patient to see how she wanted to get her [employer].  She advised that she wanted it mailed to the address on the form she gave [place] at the office.  I let her know that I would mail it to [address] [place], [place] [address].  She verbalized understanding.  Letter put in outgoing mail today to go out tomorrow.  Nothing further needed.

## 2022-03-18 NOTE — Telephone Encounter (Signed)
Received jury summons from up front. Will give to Dr. Annamaria Boots.   Dr. Annamaria Boots, please advise if you are ok with excusing her from jury duty. Thanks!

## 2022-04-03 ENCOUNTER — Telehealth: Payer: Self-pay | Admitting: Internal Medicine

## 2022-04-03 MED ORDER — BREZTRI AEROSPHERE 160-9-4.8 MCG/ACT IN AERO: 2.0000 | INHALATION_SPRAY | Freq: Two times a day (BID) | RESPIRATORY_TRACT | 3 refills | Status: DC

## 2022-04-03 NOTE — Telephone Encounter (Signed)
I called the patient and let her know that I have sent in a refill to the pharmacy. She was appreciative of the call. Nothing further needed.

## 2022-04-17 ENCOUNTER — Other Ambulatory Visit: Payer: Self-pay | Admitting: Cardiovascular Disease

## 2022-04-17 NOTE — Telephone Encounter (Signed)
Rx request sent to pharmacy.  

## 2022-04-22 ENCOUNTER — Telehealth: Payer: Self-pay | Admitting: Internal Medicine

## 2022-04-22 ENCOUNTER — Other Ambulatory Visit (HOSPITAL_COMMUNITY): Payer: Self-pay

## 2022-04-22 MED ORDER — ALBUTEROL SULFATE HFA 108 (90 BASE) MCG/ACT IN AERS: INHALATION_SPRAY | RESPIRATORY_TRACT | 4 refills | Status: DC

## 2022-04-22 NOTE — Telephone Encounter (Signed)
Called and spoke to pt. Informed her of the information per the pharmacy team. Pt verbalized understanding and states she needs a new script. Rx sent to [facility]. Pt verbalized understanding and denied any further questions or concerns at this time.

## 2022-05-14 ENCOUNTER — Other Ambulatory Visit: Payer: Self-pay | Admitting: Internal Medicine

## 2022-05-14 DIAGNOSIS — Z1231 Encounter for screening mammogram for malignant neoplasm of breast: Secondary | ICD-10-CM

## 2022-06-05 NOTE — Progress Notes (Signed)
Patient ID: [name], female    DOB: [birth year], 56 y.o.   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 Walk test on room air 06/28/2016-95%, 95%, 95%, 93%, peak heart rate 120/minute. No desaturation after 3185 feet. Office Spirometry 06/28/2016-limited validity due to cough. Restriction of exhaled volume. FVC 1.79/50%, FEV1 1.64/57%, ratio 0.80. FENO- 5 Walk Test on room Air- Qualified for home O2 04/10/17 PFT 06/17/17- severe obstruction, mild restriction, diffusion severely reduced. Insignificant response to bronchodilator. FVC 1.73/47%, FEV1 1.10/38%, ratio 0.64, TLC 77%, DLCO 32% CHF- acute hosp 05/2017- CHF, CAD, EF 20-25%. Stent. 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 She will discuss with PCP. Mucinex helps if wheezing. Continues her bronchodilators. No recent infection or acute concern. CXR 10/25/21- FINDINGS: Stable right paratracheal soft tissue density with surgical clips and volume loss and hilar retraction. No new infiltrate or nodule. IMPRESSION: Stable right upper lobe parenchymal changes as above. No acute findings.  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  No associated adenopathy. Breathing has been stable with no acute exacerbation.  She likes Breztri inhaler.  Deep breath triggers some cough productive of scant white sputum.  Review of Systems-+ = positive Constitutional:   No-   weight loss, night sweats, fevers, chills, fatigue, lassitude. HEENT:   No-  headaches, difficulty swallowing, tooth/dental problems, sore throat,      mild sneezing,no- itching, ear ache, nasal congestion, post nasal drip,  CV:  No-   chest pain, orthopnea, PND, swelling in lower extremities, anasarca, dizziness, palpitations Resp: + shortness of breath with exertion or at rest.              +productive cough, + non-productive cough,  No- coughing up of blood.               change in color of mucus. + wheezing.   Skin: No-   rash or lesions. GI:  +HPI GU:   MS:  No-   joint pain or swelling.   Neuro-  nothing unusual Psych:  No- change in mood or affect. No depression or  anxiety.  No memory loss.   Objective:   Physical Exam   on room air today-arrival sat 96% General- Alert, Oriented, Affect-appropriate, Distress - NAD,  Skin- rash-none, lesions- none, excoriation- none.  Lymphadenopathy- none Head- atraumatic            Eyes- Gross vision intact, PERRLA, conjunctivae clear secretions            Ears- Hearing, canals-normal            Nose- , no-Septal dev, mucus, polyps, erosion, perforation             Throat- Mallampati IV , mucosa clear , drainage- none, tonsils- atrophic,  Neck- flexible , trachea midline, no stridor , thyroid nl, carotid no bruit Chest - symmetrical excursion , unlabored           Heart/CV- RRR rapid , no murmur , no gallop  , no rub, nl s1 s2                           - JVD none , edema- none, stasis changes- none, varices- none           Lung-   Clear/diminished, Wheeze-none, cough-None, dullness-none, rub- none. Rales- none           Chest wall- + soft mobile mass L lateral chest wall. Abd-  Br/ Gen/ Rectal- Not done, not indicated Extrem- cyanosis- none, clubbing, none, atrophy- none, strength- nl,    Neuro- grossly intact to observation

## 2022-06-06 ENCOUNTER — Ambulatory Visit (INDEPENDENT_AMBULATORY_CARE_PROVIDER_SITE_OTHER): Payer: Medicare Other | Admitting: Internal Medicine

## 2022-06-06 ENCOUNTER — Encounter: Payer: Self-pay | Admitting: Internal Medicine

## 2022-06-06 VITALS — BP 118/68 | HR 88 | Wt 166.8 lb

## 2022-06-06 DIAGNOSIS — Z23 Encounter for immunization: Secondary | ICD-10-CM | POA: Diagnosis not present

## 2022-06-06 DIAGNOSIS — R222 Localized swelling, mass and lump, trunk: Secondary | ICD-10-CM | POA: Diagnosis not present

## 2022-06-06 DIAGNOSIS — J449 Chronic obstructive pulmonary disease, unspecified: Secondary | ICD-10-CM

## 2022-06-06 MED ORDER — ZOLPIDEM TARTRATE 5 MG PO TABS: 5.0000 mg | ORAL_TABLET | Freq: Every evening | ORAL | 5 refills | Status: DC | PRN

## 2022-06-06 MED ORDER — ALBUTEROL SULFATE HFA 108 (90 BASE) MCG/ACT IN AERS: INHALATION_SPRAY | RESPIRATORY_TRACT | 4 refills | Status: DC

## 2022-06-06 NOTE — Patient Instructions (Signed)
Order- schedule CT chest no contrast     dx left lateral chest wall mass  Order- referral to [facility]    dx left chest wall mass  Order- flu vax standard  Refill printed for your Ventolin  Refill sent for Ambien

## 2022-06-07 ENCOUNTER — Encounter: Payer: Self-pay | Admitting: Internal Medicine

## 2022-06-07 DIAGNOSIS — R222 Localized swelling, mass and lump, trunk: Secondary | ICD-10-CM | POA: Insufficient documentation

## 2022-06-07 NOTE — Assessment & Plan Note (Signed)
[name] tree has been providing stable symptomatic control and she is pleased. Plan-continue Breztri.  Planning CT chest as part of evaluation for left chest wall mass.

## 2022-06-07 NOTE — Assessment & Plan Note (Signed)
Exam consistent with benign lipoma.  She has had a complicated neoplastic history and appropriately wants this evaluated. Plan-CT chest, referral to general surgery.

## 2022-06-11 ENCOUNTER — Telehealth: Payer: Self-pay | Admitting: Internal Medicine

## 2022-06-11 MED ORDER — DOXYCYCLINE HYCLATE 100 MG PO TABS: 100.0000 mg | ORAL_TABLET | Freq: Two times a day (BID) | ORAL | 0 refills | Status: DC

## 2022-06-11 NOTE — Telephone Encounter (Signed)
Spoke with the pt  She did test pos for covid  She tried taking the Doxy and started vomiting Please advise, thanks   No Known Allergies

## 2022-06-11 NOTE — Telephone Encounter (Signed)
Called to check on pt  She states feeling better since spoke with her a while ago  She has taken tylenol and fever came down  She feels ok to wait until 06/12/22 to address this call  I advised stay well hydrated and seek emergency care if needed

## 2022-06-11 NOTE — Telephone Encounter (Signed)
Called and spoke with patient. She verbalized understanding.   Nothing further needed at time of call.  

## 2022-06-11 NOTE — Telephone Encounter (Signed)
I've sent doxycycline. Agree with getting Covid test. Stay well-hydrated. Ok symptom meds like Theraflu or etc as needed. Hope she feels better soon.

## 2022-06-11 NOTE — Telephone Encounter (Signed)
Called and spoke with patient. She stated that she developed a fever last night of 102.3. She has also developed a severe headache, runny nose and mostly dry cough. She can feel the congestion in her chest but she is not able to cough up any phlegm. She has not taken her temp this morning because she has been taking Tylenol around the clock for her body aches. She feels very tired and achy.   She denied completing a covid test but does have access to one at home. I recommended that she test herself.   She is still using her Breztri daily and Duoneb solution as needed.   [facility] is [facility] [address].   Dr. Annamaria Boots, can you please advise? Thanks!

## 2022-06-12 ENCOUNTER — Telehealth: Payer: Self-pay | Admitting: Cardiovascular Disease

## 2022-06-12 MED ORDER — NIRMATRELVIR/RITONAVIR (PAXLOVID)TABLET: 3.0000 | ORAL_TABLET | Freq: Two times a day (BID) | ORAL | 0 refills | Status: AC

## 2022-06-12 NOTE — Telephone Encounter (Signed)
Patient will need to hold atorvastatin while taking Paxlovid.

## 2022-06-12 NOTE — Telephone Encounter (Signed)
Per secure Epic chat from Dr Annamaria Boots- send in rx for Paxlovid.  I have sent to pharm  Pt aware  Nothing further needed

## 2022-06-12 NOTE — Telephone Encounter (Signed)
Pt c/o medication issue:  1. Name of Medication: Paxlovid  2. How are you currently taking this medication (dosage and times per day)? Hasn't started taking it  3. Are you having a reaction (difficulty breathing--STAT)?   4. What is your medication issue? Pt states that she tested positive for covid and would like to make sure she can take the medicine prescribed with her current meds. Please advise.

## 2022-06-12 NOTE — Telephone Encounter (Signed)
Please advise 

## 2022-06-13 ENCOUNTER — Ambulatory Visit: Payer: Medicare Other

## 2022-06-13 ENCOUNTER — Ambulatory Visit (HOSPITAL_COMMUNITY): Payer: Medicare Other

## 2022-06-19 ENCOUNTER — Other Ambulatory Visit: Payer: Self-pay | Admitting: Cardiovascular Disease

## 2022-06-21 ENCOUNTER — Other Ambulatory Visit: Payer: Self-pay | Admitting: Cardiovascular Disease

## 2022-06-24 NOTE — Telephone Encounter (Signed)
Rx(s) sent to pharmacy electronically.  

## 2022-07-05 ENCOUNTER — Other Ambulatory Visit: Payer: Medicare Other

## 2022-07-05 ENCOUNTER — Ambulatory Visit (HOSPITAL_COMMUNITY)
Admission: RE | Admit: 2022-07-05 | Discharge: 2022-07-05 | Disposition: A | Discharge status: Home / Self Care | Payer: Medicare Other | Source: Ambulatory Visit | Attending: Internal Medicine | Admitting: Internal Medicine

## 2022-07-05 ENCOUNTER — Ambulatory Visit: Payer: Medicare Other | Admitting: Hematology

## 2022-07-05 DIAGNOSIS — R222 Localized swelling, mass and lump, trunk: Secondary | ICD-10-CM | POA: Diagnosis present

## 2022-07-08 ENCOUNTER — Other Ambulatory Visit: Payer: Self-pay

## 2022-07-08 DIAGNOSIS — R222 Localized swelling, mass and lump, trunk: Secondary | ICD-10-CM

## 2022-07-09 ENCOUNTER — Ambulatory Visit: Payer: Medicare Other

## 2022-07-16 ENCOUNTER — Ambulatory Visit (HOSPITAL_COMMUNITY): Payer: Medicare Other

## 2022-07-17 ENCOUNTER — Other Ambulatory Visit: Payer: Self-pay | Admitting: Internal Medicine

## 2022-07-17 ENCOUNTER — Other Ambulatory Visit: Payer: Self-pay | Admitting: Cardiovascular Disease

## 2022-07-17 DIAGNOSIS — R911 Solitary pulmonary nodule: Secondary | ICD-10-CM

## 2022-07-17 NOTE — Telephone Encounter (Signed)
Rx request sent to pharmacy.  

## 2022-08-01 ENCOUNTER — Ambulatory Visit
Admission: RE | Admit: 2022-08-01 | Discharge: 2022-08-01 | Disposition: A | Discharge status: Home / Self Care | Payer: Medicare Other | Source: Ambulatory Visit | Attending: Internal Medicine | Admitting: Internal Medicine

## 2022-08-01 DIAGNOSIS — Z1231 Encounter for screening mammogram for malignant neoplasm of breast: Secondary | ICD-10-CM

## 2022-09-06 ENCOUNTER — Other Ambulatory Visit: Payer: Self-pay | Admitting: Cardiovascular Disease

## 2022-09-06 NOTE — Telephone Encounter (Signed)
Rx request sent to pharmacy.  

## 2022-09-09 ENCOUNTER — Other Ambulatory Visit: Payer: Self-pay | Admitting: Cardiovascular Disease

## 2022-09-09 NOTE — Telephone Encounter (Signed)
Rx request sent to pharmacy.  

## 2022-09-26 ENCOUNTER — Telehealth: Payer: Self-pay | Admitting: Cardiovascular Disease

## 2022-09-26 ENCOUNTER — Other Ambulatory Visit (HOSPITAL_BASED_OUTPATIENT_CLINIC_OR_DEPARTMENT_OTHER): Payer: Self-pay | Admitting: Cardiovascular Disease

## 2022-09-26 DIAGNOSIS — E7800 Pure hypercholesterolemia, unspecified: Secondary | ICD-10-CM

## 2022-09-26 DIAGNOSIS — Z5181 Encounter for therapeutic drug level monitoring: Secondary | ICD-10-CM

## 2022-09-26 MED ORDER — ATORVASTATIN CALCIUM 40 MG PO TABS: 40.0000 mg | ORAL_TABLET | Freq: Every day | ORAL | 3 refills | Status: DC

## 2022-09-26 NOTE — Telephone Encounter (Signed)
Rx(s) sent to pharmacy electronically.  

## 2022-09-26 NOTE — Telephone Encounter (Signed)
Ok to advise patient to add zetia 10mg  daily since going back to lower dose atorvastatin?

## 2022-09-26 NOTE — Telephone Encounter (Signed)
Rx updated and sent to pharm on file, labs ordered. Patient states she has appointment on 2/13 and would like to be given her lab slips then instead of having them mailed. Appointment notes updated!     "Yes ok to split the 80's she has and send in new Rx for the 40mg . Ok to add zetia 10mg  daily with repeat LFT and Lipid in 8 weeks."

## 2022-09-26 NOTE — Telephone Encounter (Signed)
Pt c/o medication issue:  1. Name of Medication: atorvastatin (LIPITOR) 80 MG tablet   2. How are you currently taking this medication (dosage and times per day)? Taking 40 mg daily instead  3. Are you having a reaction (difficulty breathing--STAT)? no  4. What is your medication issue? Patient states she was taking 40 mg of the atorvastatin, but it was increased to 80 mg. She says the 80 mg was making her sick to her stomach so she went back to the 40 mg tablets. She says she will be running out of the 40 mg tablets and would like the prescription updated and sent to her pharmacy. She says she does still have some 80 mg tablets and would like to know if those can be cut in half.

## 2022-09-26 NOTE — Telephone Encounter (Signed)
Yes ok to split the 80's she has and send in new Rx for the 40mg . Ok to add zetia 10mg  daily with repeat LFT and Lipid in 8 weeks.

## 2022-10-01 ENCOUNTER — Ambulatory Visit (HOSPITAL_BASED_OUTPATIENT_CLINIC_OR_DEPARTMENT_OTHER): Payer: 59 | Admitting: Cardiovascular Disease

## 2022-10-14 NOTE — Progress Notes (Incomplete)
Cardiology Office Note:    Date:  10/14/2022   ID:  [name], DOB 05-09-1966, MRN [id number]  PCP:  Donnajean Lopes, MD   [facility] Cardiologist:  Skeet Latch, MD { Click to update primary MD,subspecialty MD or APP then REFRESH:1}    Referring MD: Donnajean Lopes, MD   No chief complaint on file. ***  History of Present Illness:    [name] is a 57 y.o. female with a hx of CAD, CHF, aortic valve regurgitation, non-small cell lung cancer, COPD, adenocarcinoma of the colon.    Past Medical History:  Diagnosis Date   Acute bilateral low back pain without sciatica    Acute congestive heart failure ([place]) 05/02/2017   EF 20-25% on ECHO   Acute respiratory failure with hypoxia (HCC) 05/01/2017   Adhesive capsulitis 05/22/2011   Allergic rhinitis    Allergy    Anxiety disorder    Aortic valve regurgitation 05/15/2017   Arm numbness 08/09/2021   Asthma    Chronic bronchitis    Chronic diastolic heart failure ([place]) 12/22/2019   Colon cancer ([place])    adenocarcinoma in a polyps 11-24-2014   COPD (chronic obstructive pulmonary disease) (HCC)    Cough    Dizziness and giddiness    Dyspepsia    Dyspnea 04/30/2017   Emphysema of lung [facility])    External hemorrhoids    EXTERNAL HEMORRHOIDS 09/27/2009   Female stress incontinence    Genetic testing 08/18/2016   Negative for known pathogenic mutations within any of 25 genes on a Custom Panel through [employer].  One variant of uncertain significance (VUS) called "c.7187C>G (p.Thr2396Ser)" was found in one copy of the ATM gene.  This Custom Cancer Panel offered by [employer] includes sequencing and/or duplication/deletion testing of the following 25 genes: APC, ATM, AXIN2, BAP1, BMPR1A, BRCA1, BRCA2, CDH1, CDK4, CDKN2A, CHEK2, EPCAM, MITF, MLH1, MSH2, MSH6, MUTYH, PMS2, POLD1, POLE, PTEN, SCG5/GREM1, SMAD4, STK11, and TP53. Date of report is July 23, 2016.  MSH2 Exons 1-7 Inversion Analysis was  also negative through [employer].  Date of report is July 23, 2016.      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 NPSG was normal years ago.     Swelling of right lower extremity 12/01/2014    Past Surgical History:  Procedure Laterality Date   ABDOMINAL HYSTERECTOMY  2001   no BSO   biopsy of right neck mass     negative-thyroid biopsy    BREAST EXCISIONAL BIOPSY Right 1987   no visible scar   BRONCHOSCOPY  01/31/2005   nonspecific inflammation   BRONCHOSCOPY  08/24/2010   nonspecific inflammation   COLONOSCOPY     COLONOSCOPY WITH PROPOFOL N/A 02/07/2020   Procedure: COLONOSCOPY WITH PROPOFOL;  Surgeon: Yetta Flock, MD;  Location: [facility];  Service: Gastroenterology;  Laterality: N/A;   CORONARY STENT INTERVENTION N/A 05/12/2017   Procedure: CORONARY STENT INTERVENTION;  Surgeon: Charolette Forward, MD;  Location: [place] [facility];  Service: Cardiovascular;  Laterality: N/A;   LUNG REMOVAL, PARTIAL     for lung cancer--followed by Dr. Arlyce Dice and Dr. Annamaria Boots   MELANOMA EXCISION WITH SENTINEL LYMPH NODE BIOPSY Right 11/29/2014   Procedure: RIGHT INGUINAL SENTINEL LYMPH NODE BIOPSY;  Surgeon: Georganna Skeans, MD;  Location: [facility];  Service: General;  Laterality: Right;   melanoma removal     right calf    POLYPECTOMY     POLYPECTOMY  02/07/2020   Procedure: POLYPECTOMY;  Surgeon: Yetta Flock, MD;  Location: [facility];  Service:  Gastroenterology;;   portacath removed  10/2007   RIGHT/LEFT HEART CATH AND CORONARY ANGIOGRAPHY N/A 05/06/2017   Procedure: RIGHT/LEFT HEART CATH AND CORONARY ANGIOGRAPHY;  Surgeon: [name], MD;  Location: [place] [facility];  Service: Cardiovascular;  Laterality: N/A;   SPIROMETRY  07/03/2002   min obstruction,mild restriction 01/31/2005   tvh  12/01/2000   hysterectomy    Current Medications: No outpatient medications have been marked as taking for the 10/15/22 encounter (Appointment) with Loel Dubonnet, NP.     Allergies:   Patient has no known allergies.   Social History   Socioeconomic History   Marital status: Widowed    Spouse name: Not on file   Number of children: 1   Years of education: 10   Highest education level: Not on file  Occupational History   Occupation: Disabled- Cytogeneticist: UNEMPLOYED  Tobacco Use   Smoking status: Former    Packs/day: 2.00    Years: 23.00    Total pack years: 46.00    Types: Cigarettes    Start date: 09/02/1978    Quit date: 10/26/2001    Years since quitting: 20.9   Smokeless tobacco: Never   Tobacco comments:    Significant passive exposure from boy friend  Vaping Use   Vaping Use: Never used  Substance and Sexual Activity   Alcohol use: No    Alcohol/week: 0.0 standard drinks of alcohol    Comment: history of alcohol abuse 20-30   Drug use: No    Comment: +MJ   Sexual activity: Not on file  Other Topics Concern   Not on file  Social History Narrative   Health Care POA:    Emergency Contact:    End of Life Plan:    Who lives with you: self   Any pets: cat, Bart   Diet: Pt has a varied diet but reports not eating much because she is afraid of weight gain.   Exercise: Pt has no regular exercise routine.   Seatbelts: Pt reports wearing seatbelt when in vehicles.    Hobbies: movies         Social Determinants of Radio broadcast assistant Strain: Not on file  Food Insecurity: Not on file  Transportation  Needs: Not on file  Physical Activity: Not on file  Stress: Not on file  Social Connections: Not on file     Family History: The patient's ***family history includes Asthma in her daughter; COPD in her maternal grandmother and mother; Cancer in her paternal grandfather; Cirrhosis in her paternal uncle; Colon polyps in her brother; Diabetes in her maternal grandfather; Lung cancer in her father; Other in her maternal aunt and mother; Throat cancer in an other family member. There is no history of Colon  cancer, Rectal cancer, or Stomach cancer.  ROS:   Please see the history of present illness.    *** All other systems reviewed and are negative.  EKGs/Labs/Other Studies Reviewed:    The following studies were reviewed today: ***  EKG:  EKG is *** ordered today.  The ekg ordered today demonstrates ***  Recent Labs: No results found for requested labs within last 365 days.  Recent Lipid Panel    Component Value Date/Time   CHOL 135 08/09/2021 1006   TRIG 129 08/09/2021 1006   HDL 31 (L) 08/09/2021 1006   CHOLHDL 4.4 08/09/2021 1006   CHOLHDL 3.5 07/28/2018 1427   VLDL 19 07/28/2018 1427   LDLCALC 81 08/09/2021 1006   LDLDIRECT 137 (H) 09/27/2009 2057     Risk Assessment/Calculations:   {Does this patient have ATRIAL FIBRILLATION?:[id number]}  No BP recorded.  {Refresh Note OR Click here to enter BP  :1}***         Physical Exam:    VS:  There were no vitals taken for this visit.    Wt Readings from Last 3 Encounters:  06/06/22 166 lb 12.8 oz (75.7 kg)  12/04/21 169 lb (76.7 kg)  10/25/21 172 lb (78 kg)     GEN: *** Well nourished, well developed in no acute distress HEENT: Normal NECK: No JVD; No carotid bruits LYMPHATICS: No lymphadenopathy CARDIAC: ***RRR, no murmurs, rubs, gallops RESPIRATORY:  Clear to auscultation without rales, wheezing or rhonchi  ABDOMEN: Soft, non-tender, non-distended MUSCULOSKELETAL:  No edema; No deformity  SKIN: Warm and  dry NEUROLOGIC:  Alert and oriented x 3 PSYCHIATRIC:  Normal affect   ASSESSMENT:    No diagnosis found. PLAN:    In order of problems listed above:  ***      {Are you ordering a CV Procedure (e.g. stress test, cath, DCCV, TEE, etc)?   Press F2        :[id number]}    Medication Adjustments/Labs and Tests Ordered: Current medicines are reviewed at length with the patient today.  Concerns regarding medicines are outlined above.  No orders of the defined types were placed in this encounter.  No orders of the defined types were placed in this encounter.   There are no Patient Instructions on file for this visit.   Signed, Trudi Ida, NP  10/14/2022 4:26 PM    [place]

## 2022-10-15 ENCOUNTER — Ambulatory Visit (HOSPITAL_BASED_OUTPATIENT_CLINIC_OR_DEPARTMENT_OTHER): Payer: 59 | Admitting: Cardiology

## 2022-10-28 ENCOUNTER — Other Ambulatory Visit (HOSPITAL_BASED_OUTPATIENT_CLINIC_OR_DEPARTMENT_OTHER): Payer: Self-pay | Admitting: Cardiovascular Disease

## 2022-11-08 ENCOUNTER — Ambulatory Visit (HOSPITAL_BASED_OUTPATIENT_CLINIC_OR_DEPARTMENT_OTHER): Payer: 59 | Admitting: Family

## 2022-11-13 ENCOUNTER — Other Ambulatory Visit: Payer: Self-pay

## 2022-11-13 DIAGNOSIS — C189 Malignant neoplasm of colon, unspecified: Secondary | ICD-10-CM

## 2022-11-13 DIAGNOSIS — C4371 Malignant melanoma of right lower limb, including hip: Secondary | ICD-10-CM

## 2022-11-13 DIAGNOSIS — C3491 Malignant neoplasm of unspecified part of right bronchus or lung: Secondary | ICD-10-CM

## 2022-11-14 ENCOUNTER — Inpatient Hospital Stay: Payer: 59 | Attending: Hematology

## 2022-11-14 ENCOUNTER — Encounter: Payer: Self-pay | Admitting: Hematology

## 2022-11-14 ENCOUNTER — Other Ambulatory Visit: Payer: Self-pay

## 2022-11-14 ENCOUNTER — Inpatient Hospital Stay (HOSPITAL_BASED_OUTPATIENT_CLINIC_OR_DEPARTMENT_OTHER): Payer: 59 | Admitting: Hematology

## 2022-11-14 VITALS — BP 95/63 | HR 90 | Temp 98.1°F | Resp 17 | Ht 65.0 in | Wt 164.4 lb

## 2022-11-14 DIAGNOSIS — C189 Malignant neoplasm of colon, unspecified: Secondary | ICD-10-CM

## 2022-11-14 DIAGNOSIS — Z85118 Personal history of other malignant neoplasm of bronchus and lung: Secondary | ICD-10-CM | POA: Insufficient documentation

## 2022-11-14 DIAGNOSIS — Z85038 Personal history of other malignant neoplasm of large intestine: Secondary | ICD-10-CM | POA: Diagnosis present

## 2022-11-14 DIAGNOSIS — Z85820 Personal history of malignant melanoma of skin: Secondary | ICD-10-CM | POA: Insufficient documentation

## 2022-11-14 DIAGNOSIS — C3491 Malignant neoplasm of unspecified part of right bronchus or lung: Secondary | ICD-10-CM

## 2022-11-14 DIAGNOSIS — Z87891 Personal history of nicotine dependence: Secondary | ICD-10-CM | POA: Insufficient documentation

## 2022-11-14 DIAGNOSIS — C4371 Malignant melanoma of right lower limb, including hip: Secondary | ICD-10-CM

## 2022-11-14 LAB — CBC WITH DIFFERENTIAL (CANCER CENTER ONLY)
Abs Immature Granulocytes: 0.02 10*3/uL (ref 0.00–0.07)
Basophils Absolute: 0 10*3/uL (ref 0.0–0.1)
Basophils Relative: 0 %
Eosinophils Absolute: 0.2 10*3/uL (ref 0.0–0.5)
Eosinophils Relative: 3 %
HCT: 36.6 % (ref 36.0–46.0)
Hemoglobin: 12.7 g/dL (ref 12.0–15.0)
Immature Granulocytes: 0 %
Lymphocytes Relative: 12 %
Lymphs Abs: 1 10*3/uL (ref 0.7–4.0)
MCH: 29.5 pg (ref 26.0–34.0)
MCHC: 34.7 g/dL (ref 30.0–36.0)
MCV: 85.1 fL (ref 80.0–100.0)
Monocytes Absolute: 0.7 10*3/uL (ref 0.1–1.0)
Monocytes Relative: 8 %
Neutro Abs: 6.4 10*3/uL (ref 1.7–7.7)
Neutrophils Relative %: 77 %
Platelet Count: 208 10*3/uL (ref 150–400)
RBC: 4.3 MIL/uL (ref 3.87–5.11)
RDW: 14.1 % (ref 11.5–15.5)
WBC Count: 8.3 10*3/uL (ref 4.0–10.5)
nRBC: 0 % (ref 0.0–0.2)

## 2022-11-14 LAB — CMP (CANCER CENTER ONLY)
ALT: 11 U/L (ref 0–44)
AST: 11 U/L — ABNORMAL LOW (ref 15–41)
Albumin: 4.2 g/dL (ref 3.5–5.0)
Alkaline Phosphatase: 88 U/L (ref 38–126)
Anion gap: 6 (ref 5–15)
BUN: 13 mg/dL (ref 6–20)
CO2: 27 mmol/L (ref 22–32)
Calcium: 9.1 mg/dL (ref 8.9–10.3)
Chloride: 106 mmol/L (ref 98–111)
Creatinine: 0.8 mg/dL (ref 0.44–1.00)
GFR, Estimated: >60 mL/min (ref >60)
Glucose, Bld: 93 mg/dL (ref 70–99)
Potassium: 3.6 mmol/L (ref 3.5–5.1)
Sodium: 139 mmol/L (ref 135–145)
Total Bilirubin: 1.1 mg/dL (ref 0.3–1.2)
Total Protein: 7.1 g/dL (ref 6.5–8.1)

## 2022-11-14 NOTE — Progress Notes (Signed)
[place]   [phone] Fax:([phone]) [phone]   Clinic Follow up Note   Patient Care Team: Donnajean Lopes, MD as PCP - General (Internal Medicine) Skeet Latch, MD as PCP - Cardiology (Cardiology) Deneise Lever, MD (Pulmonary Disease) Harriett Sine, MD as Consulting Physician (Dermatology) Armbruster, Carlota Raspberry, MD as Consulting Physician (Gastroenterology) Larey Dresser, MD as Consulting Physician (Cardiology) Truitt Merle, MD as Attending Physician (Hematology and Oncology)  Date of Service:  11/14/2022  CHIEF COMPLAINT: f/u of malignant melanoma, colon carcinoma, lung cancer   CURRENT THERAPY: Observation     ASSESSMENT:  [name] is a 57 y.o. female with   Non-small cell cancer of right lung ([place]) -status post right upper lobe wedge resection of a T1 N2, stage IIIA non-small cell lung cancer (adenocarcinoma) measuring 1.8 cm, poorly differentiated, with negative margins, but with 4 positive lymph nodes, 3 N1 and 1 N2;  -on Iressa 2004 to 2009 - no evidence of disease recurrence on most recent scans in 07/06/2023, and likely cured -She will continue follow-up with Dr. Annamaria Boots, she has a repeated CT scan scheduled in May 2024.    Adenocarcinoma, colon ([place]) -Was found on endoscopy polypectomy, she did not require surgery or additional treatment. -She has been follow-up with GI Dr. Zenia Resides breast close today, last colonoscopy in June 2021, she is due again in 2026.  History of skin melanoma -Removed, follow-up with dermatology annually.   PLAN: -She is clinically doing well, no concern for cancer recurrence or new cancer. -She will continue follow-up with her specialists, I will see her as needed in the future.   SUMMARY OF ONCOLOGIC HISTORY: Oncology History Overview Note   Cancer Staging  Adenocarcinoma, colon ([place]) Staging form: Colon and Rectum, AJCC 7th Edition - Clinical: Stage I (T1, N0, M0) - Signed by Chauncey Cruel, MD on 08/08/2015  Malignant melanoma of skin of right lower extremity (HCC) Staging form: Melanoma of the Skin, AJCC 7th Edition - Clinical: Stage IA (T1a, N0, M0) - Signed by Chauncey Cruel, MD on 08/08/2015  Non-small cell cancer of right lung [facility]) Staging form: Lung, AJCC 6th Edition - Clinical: Stage IIIA (T1, N2, M0) - Signed by Chauncey Cruel, MD on 08/08/2015     Non-small cell cancer of right lung ([place])  10/30/2006 Initial Diagnosis   Non-small cell cancer of right lung (HCC)   Adenocarcinoma, colon ([place])  12/01/2014 Initial Diagnosis   Adenocarcinoma, colon ([place])      INTERVAL HISTORY:  [name] is here for a follow up of malignant melanoma, colon carcinoma, lung cancer  She was last seen by Dr. Jana Hakim on 08/08/2021 She presents to the clinic alone.  She is doing well overall, she had a few episodes of upper respiratory infection in the past 3 to 4 months, still has a mild sinus congestion and cough.  No fever, chills, or other complaints.  She was a heavy smoker, but has stopped smoking when she was diagnosed with lung cancer.   All other systems were reviewed with the patient and are negative.  MEDICAL HISTORY:  Past Medical History:  Diagnosis Date   Acute bilateral low back pain without sciatica    Acute congestive heart failure ([place]) 05/02/2017   EF 20-25% on ECHO   Acute respiratory failure with hypoxia (HCC) 05/01/2017   Adhesive capsulitis 05/22/2011   Allergic rhinitis    Allergy    Anxiety disorder    Aortic valve  regurgitation 05/15/2017   Arm numbness 08/09/2021   Asthma    Chronic bronchitis    Chronic diastolic heart failure ([place]) 12/22/2019   Colon cancer ([place])    adenocarcinoma in a polyps 11-24-2014   COPD (chronic obstructive pulmonary disease) (HCC)    Cough    Dizziness and giddiness    Dyspepsia    Dyspnea 04/30/2017   Emphysema of lung [facility])    External hemorrhoids    EXTERNAL HEMORRHOIDS 09/27/2009   Female stress  incontinence    Genetic testing 08/18/2016   Negative for known pathogenic mutations within any of 25 genes on a Custom Panel through [employer].  One variant of uncertain significance (VUS) called "c.7187C>G (p.Thr2396Ser)" was found in one copy of the ATM gene.  This Custom Cancer Panel offered by [employer] includes sequencing and/or duplication/deletion testing of the following 25 genes: APC, ATM, AXIN2, BAP1, BMPR1A, BRCA1, BRCA2, CDH1, CDK4, CDKN2A, CHEK2, EPCAM, MITF, MLH1, MSH2, MSH6, MUTYH, PMS2, POLD1, POLE, PTEN, SCG5/GREM1, SMAD4, STK11, and TP53. Date of report is July 23, 2016.  MSH2 Exons 1-7 Inversion Analysis was also negative through [employer].  Date of report is July 23, 2016.      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 NPSG was normal years ago.     Swelling of right lower extremity 12/01/2014    SURGICAL HISTORY: Past Surgical History:  Procedure Laterality Date   ABDOMINAL HYSTERECTOMY  2001   no BSO   biopsy of right neck mass     negative-thyroid biopsy    BREAST EXCISIONAL BIOPSY Right 1987   no visible scar   BRONCHOSCOPY  01/31/2005   nonspecific inflammation   BRONCHOSCOPY  08/24/2010   nonspecific inflammation   COLONOSCOPY     COLONOSCOPY WITH PROPOFOL N/A 02/07/2020   Procedure: COLONOSCOPY WITH PROPOFOL;  Surgeon: Yetta Flock, MD;  Location: [facility];  Service: Gastroenterology;  Laterality: N/A;    CORONARY STENT INTERVENTION N/A 05/12/2017   Procedure: CORONARY STENT INTERVENTION;  Surgeon: Charolette Forward, MD;  Location: [place] [facility];  Service: Cardiovascular;  Laterality: N/A;   LUNG REMOVAL, PARTIAL     for lung cancer--followed by Dr. Arlyce Dice and Dr. Annamaria Boots   MELANOMA EXCISION WITH SENTINEL LYMPH NODE BIOPSY Right 11/29/2014   Procedure: RIGHT INGUINAL SENTINEL LYMPH NODE BIOPSY;  Surgeon: Georganna Skeans, MD;  Location: [place];  Service: General;  Laterality: Right;   melanoma removal     right calf    POLYPECTOMY     POLYPECTOMY  02/07/2020   Procedure: POLYPECTOMY;  Surgeon: Yetta Flock, MD;  Location: [facility];  Service: Gastroenterology;;   portacath removed  10/2007   RIGHT/LEFT HEART CATH AND CORONARY ANGIOGRAPHY N/A 05/06/2017   Procedure: RIGHT/LEFT HEART CATH AND CORONARY ANGIOGRAPHY;  Surgeon: [name], MD;  Location: [place] [facility];  Service: Cardiovascular;  Laterality: N/A;   SPIROMETRY  07/03/2002   min obstruction,mild restriction 01/31/2005   tvh  12/01/2000   hysterectomy    I have reviewed the social history and family history with the patient and they are unchanged from previous note.  ALLERGIES:  has No Known Allergies.  MEDICATIONS:  Current Outpatient Medications  Medication Sig Dispense Refill   acetaminophen (TYLENOL) 500 MG tablet Take 500 mg by mouth every 8 (eight) hours as needed for moderate pain.     acyclovir (ZOVIRAX) 800 MG tablet Take 800 mg by mouth 2 (two) times daily.     albuterol (VENTOLIN HFA) 108 (90 Base) MCG/ACT inhaler INHALE 2 PUFFS INTO THE LUNGS EVERY 4 TO 6 HOURS AS NEEDED FOR WHEEZING OR SHORTNESS OF BREATH 54 g 4   ALPRAZolam (XANAX) 0.5 MG tablet Take 1 tablet (0.5 mg total) by mouth 2 (two) times daily as needed for up to 1 dose for anxiety. (Patient taking differently: Take 0.5 mg by mouth 2 (two) times daily.) 40 tablet 0   atorvastatin (LIPITOR) 40 MG tablet Take 1 tablet (40 mg total) by mouth daily. 90  tablet 3   Budeson-Glycopyrrol-Formoterol (BREZTRI AEROSPHERE) 160-9-4.8 MCG/ACT AERO Inhale 2 puffs into the lungs in the morning and at bedtime. 32.1 g 3   carvedilol (COREG) 6.25 MG tablet TAKE 1 TABLET BY MOUTH TWICE DAILY WITH MEALS 180 tablet 0   clopidogrel (PLAVIX) 75 MG tablet TAKE 1 TABLET BY MOUTH DAILY 90 tablet 3   doxycycline (VIBRA-TABS) 100 MG tablet Take 1 tablet (100 mg total) by mouth 2 (two) times daily. 14 tablet 0   ezetimibe (ZETIA) 10 MG tablet Take 10 mg by mouth daily.     furosemide (LASIX) 20 MG tablet TAKE 1 TABLET(20 MG) BY MOUTH DAILY 90 tablet 0   ipratropium (ATROVENT) 0.06 % nasal spray Place 1 spray into both nostrils 3 (three) times daily.     ipratropium-albuterol (DUONEB) 0.5-2.5 (3) MG/3ML SOLN USE 1 VIAL VIA NEBULIZER EVERY 6 HOURS AS NEEDED 90 mL 3   losartan (COZAAR) 25 MG tablet TAKE 1/2 TABLET(12.5 MG) BY MOUTH DAILY 30 tablet 0   nitroGLYCERIN (NITROSTAT) 0.4 MG SL tablet Place 1 tablet (0.4 mg total) under the tongue every 5 (five) minutes as needed for chest pain. 30 tablet 12   OXYGEN Inhale 1 L/min into the lungs as needed.     pantoprazole (PROTONIX) 40 MG tablet Take 40 mg by mouth daily.      potassium chloride (KLOR-CON) 10 MEQ tablet TAKE 1 TABLET(10 MEQ) BY MOUTH DAILY 90 tablet 4   spironolactone (ALDACTONE) 25 MG tablet Take 1 tablet (25 mg total) by mouth daily. 90 tablet 0   triamcinolone cream (KENALOG) 0.1 % Apply topically 2 (two) times daily.     zolpidem (AMBIEN) 5 MG tablet Take 1 tablet (5 mg total) by mouth at bedtime as needed. for sleep 31 tablet 5   No current facility-administered medications for this visit.    PHYSICAL EXAMINATION: ECOG PERFORMANCE STATUS: 0 - Asymptomatic  Vitals:   11/14/22 1254  BP: 95/63  Pulse: 90  Resp: 17  Temp: 98.1 F (36.7 C)  SpO2: 95%   Wt Readings from Last 3 Encounters:  11/14/22 164 lb 6.4 oz (74.6 kg)  06/06/22 166 lb 12.8 oz (75.7 kg)  12/04/21 169 lb (76.7 kg)      GENERAL:alert, no distress and comfortable SKIN: skin color, texture, turgor are normal, no rashes or significant lesions EYES: normal, Conjunctiva  are pink and non-injected, sclera clear NECK: supple, thyroid normal size, non-tender, without nodularity LYMPH:  no palpable lymphadenopathy in the cervical, axillary  LUNGS: Decreased breath sounds on both side, with occasional wheezing.  No rales.  HEART: regular rate & rhythm and no murmurs and no lower extremity edema ABDOMEN:abdomen soft, non-tender and normal bowel sounds Musculoskeletal:no cyanosis of digits and no clubbing  NEURO: alert & oriented x 3 with fluent speech, no focal motor/sensory deficits  LABORATORY DATA:  I have reviewed the data as listed    Latest Ref Rng & Units 11/14/2022   11:51 AM 08/08/2021   10:05 AM 10/16/2020   11:13 AM  CBC  WBC 4.0 - 10.5 K/uL 8.3  6.2  8.1   Hemoglobin 12.0 - 15.0 g/dL 12.7  12.8  14.5   Hematocrit 36.0 - 46.0 % 36.6  37.4  41.6   Platelets 150 - 400 K/uL 208  249  231.0         Latest Ref Rng & Units 11/14/2022   11:51 AM 08/09/2021   10:06 AM 08/08/2021   10:05 AM  CMP  Glucose 70 - 99 mg/dL 93  91  113   BUN 6 - 20 mg/dL '13  17  17   '$ Creatinine 0.44 - 1.00 mg/dL 0.80  1.05  1.02   Sodium 135 - 145 mmol/L 139  138  138   Potassium 3.5 - 5.1 mmol/L 3.6  4.4  4.0   Chloride 98 - 111 mmol/L 106  103  106   CO2 22 - 32 mmol/L '27  21  20   '$ Calcium 8.9 - 10.3 mg/dL 9.1  9.3  8.8   Total Protein 6.5 - 8.1 g/dL 7.1  7.5  7.5   Total Bilirubin 0.3 - 1.2 mg/dL 1.1  0.6  1.1   Alkaline Phos 38 - 126 U/L 88  116  99   AST 15 - 41 U/L '11  17  15   '$ ALT 0 - 44 U/L '11  12  13       '$ RADIOGRAPHIC STUDIES: I have personally reviewed the radiological images as listed and agreed with the findings in the report. No results found.    No orders of the defined types were placed in this encounter.  All questions were answered. The patient knows to call the clinic with any problems,  questions or concerns. No barriers to learning was detected. The total time spent in the appointment was 30 minutes.     Truitt Merle, MD 11/14/2022   Felicity Coyer, CMA, am acting as scribe for Truitt Merle, MD.   I have reviewed the above documentation for accuracy and completeness, and I agree with the above.

## 2022-11-14 NOTE — Assessment & Plan Note (Signed)
-  status post right upper lobe wedge resection of a T1 N2, stage IIIA non-small cell lung cancer (adenocarcinoma) measuring 1.8 cm, poorly differentiated, with negative margins, but with 4 positive lymph nodes, 3 N1 and 1 N2;  -on Iressa 2004 to 2009 - no evidence of disease recurrence on most recent scans, and likely cured

## 2022-11-14 NOTE — Assessment & Plan Note (Signed)
-  diagnosed in 2016 -

## 2022-11-27 ENCOUNTER — Other Ambulatory Visit (HOSPITAL_BASED_OUTPATIENT_CLINIC_OR_DEPARTMENT_OTHER): Payer: Self-pay | Admitting: Cardiovascular Disease

## 2022-11-27 NOTE — Telephone Encounter (Signed)
Rx request sent to pharmacy.  

## 2022-12-02 NOTE — Progress Notes (Signed)
Patient ID: TEPHANIE ARCH, female    DOB: 1965-09-08, 57 y.o.   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 Walk test on room air 06/28/2016-95%, 95%, 95%, 93%, peak heart rate 120/minute. No desaturation after 3185 feet. Office Spirometry 06/28/2016-limited validity due to cough. Restriction of exhaled volume. FVC 1.79/50%, FEV1 1.64/57%, ratio 0.80. FENO- 5 Walk Test on room Air- Qualified for home O2 04/10/17 PFT 06/17/17- severe obstruction, mild restriction, diffusion severely reduced. Insignificant response to bronchodilator. FVC 1.73/47%, FEV1 1.10/38%, ratio 0.64, TLC 77%, DLCO 32% CHF- acute hosp 05/2017- CHF, CAD, EF 20-25%. Stent. 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  No associated adenopathy. Breathing has been stable with no acute exacerbation.  She likes Breztri inhaler.  Deep breath triggers some cough productive of scant white sputum.  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 Also Breztri andd ventolin. Medications reviewed.  Easy dyspnea on exertion is stable but routine.  She says she has had 6 acute respiratory illnesses this winter.  By description this is mostly episodes of acute bronchitis addressed by her primary physician with prednisone and antibiotics. She reports chest x-ray by Dr. Silvano Rusk office without new concern.  She tested negative for flu and COVID. She is waking in the mornings with some frontal headache and is aware she snores loudly.  We discussed sleep study. She remains concerned about an apparent lipoma on her left back which she thinks has grown, remaining nontender.  The surgery office declined to see her because it was not commented on on previous chest CT.  We noted that that imaging was done with her lying on her back, flattening of the lipoma.  We discussed repeating the CT scan, taping a metal marker over the site, and bring it to the attention of the technologist. CT chest f/u pending 01/03/23- CT chest 07/08/22-  MPRESSION: 1. No correlate for the palpable abnormality about the posterolateral left chest wall. 2. Status post right upper lobectomy with presumed radiation induced consolidation within the posteromedial right upper  lung. 3. Diffuse reticulonodular opacities are similar to 2019 and likely due to chronic infection,  including atypical etiologies. Left apical pleural-based 5 mm nodule is not readily apparent on the prior, favored to represent progressive scarring. Consider chest CT follow-up at 6 months given multiple primaries. 4. Aortic atherosclerosis (ICD10-I70.0), coronary artery atherosclerosis and emphysema (ICD10-J43.9). 5. Aortic valvular calcifications. Consider echocardiography to evaluate for valvular dysfunction.  Review of Systems-+ = positive Constitutional:   No-   weight loss, night sweats, fevers, chills, fatigue, lassitude. HEENT:   No-  headaches, difficulty swallowing, tooth/dental problems, sore throat,      mild sneezing,no- itching, ear ache, nasal congestion, post nasal drip,  CV:  No-   chest pain, orthopnea, PND, swelling in lower extremities, anasarca, dizziness, palpitations Resp: + shortness of breath with exertion or at rest.              +productive cough, + non-productive cough,  No- coughing up of blood.               change in color of mucus. + wheezing.   Skin: No-   rash or lesions. GI:  +HPI GU:   MS:  No-   joint pain or swelling.   Neuro-     nothing unusual Psych:  No- change in mood or affect. No depression or anxiety.  No memory loss.   Objective:   Physical Exam   on room air today-arrival sat 96% General- Alert, Oriented, Affect-appropriate, Distress - NAD,  Skin- rash-none, lesions- none, excoriation- none.  Lymphadenopathy- none Head- atraumatic            Eyes- Gross vision intact, PERRLA, conjunctivae clear secretions            Ears- Hearing, canals-normal            Nose- , no-Septal dev, mucus, polyps, erosion, perforation             Throat- Mallampati IV , mucosa clear , drainage- none, tonsils- atrophic,  Neck- flexible , trachea midline, no stridor , thyroid nl, carotid no bruit Chest - symmetrical excursion , unlabored           Heart/CV-  RRR rapid , no murmur , no gallop  , no rub, nl s1 s2                           - JVD none , edema- none, stasis changes- none, varices- none           Lung-   Clear/diminished, Wheeze-none, cough-None, dullness-none, rub- none. Rales- none           Chest wall- + soft mobile mass L lateral chest wall. Abd-  Br/ Gen/ Rectal- Not done, not indicated Extrem- cyanosis- none, clubbing, none, atrophy- none, strength- nl,    Neuro- grossly intact to observation

## 2022-12-05 ENCOUNTER — Other Ambulatory Visit: Payer: Self-pay | Admitting: Cardiovascular Disease

## 2022-12-05 NOTE — Telephone Encounter (Signed)
Rx request sent to pharmacy.  

## 2022-12-05 NOTE — Telephone Encounter (Signed)
Rx(s) sent to pharmacy electronically.  

## 2022-12-06 ENCOUNTER — Ambulatory Visit (INDEPENDENT_AMBULATORY_CARE_PROVIDER_SITE_OTHER): Payer: 59 | Admitting: Internal Medicine

## 2022-12-06 ENCOUNTER — Other Ambulatory Visit: Payer: Self-pay | Admitting: Cardiovascular Disease

## 2022-12-06 ENCOUNTER — Encounter: Payer: Self-pay | Admitting: Internal Medicine

## 2022-12-06 VITALS — BP 116/60 | HR 98 | Ht 65.0 in | Wt 167.0 lb

## 2022-12-06 DIAGNOSIS — R222 Localized swelling, mass and lump, trunk: Secondary | ICD-10-CM | POA: Diagnosis not present

## 2022-12-06 DIAGNOSIS — J449 Chronic obstructive pulmonary disease, unspecified: Secondary | ICD-10-CM | POA: Diagnosis not present

## 2022-12-06 DIAGNOSIS — F5101 Primary insomnia: Secondary | ICD-10-CM

## 2022-12-06 DIAGNOSIS — J9611 Chronic respiratory failure with hypoxia: Secondary | ICD-10-CM | POA: Diagnosis not present

## 2022-12-06 DIAGNOSIS — R0683 Snoring: Secondary | ICD-10-CM

## 2022-12-06 MED ORDER — ALBUTEROL SULFATE HFA 108 (90 BASE) MCG/ACT IN AERS: INHALATION_SPRAY | RESPIRATORY_TRACT | 4 refills | Status: DC

## 2022-12-06 MED ORDER — ZOLPIDEM TARTRATE 5 MG PO TABS: ORAL_TABLET | ORAL | 1 refills | Status: DC

## 2022-12-06 MED ORDER — BREZTRI AEROSPHERE 160-9-4.8 MCG/ACT IN AERO: 2.0000 | INHALATION_SPRAY | Freq: Two times a day (BID) | RESPIRATORY_TRACT | 3 refills | Status: DC

## 2022-12-06 NOTE — Patient Instructions (Addendum)
Order- schedule home sleep test    dx snoring  Order- - comment on order for pending CT chest on 01/03/23.- Note lipoma left posterior axillary line   Suggest- when you go for your CT on May 3, tape something small and metal, like a BB or a dime, over the center of the lump on your back and show it to the radiology tech.  Script sent refilling Katheran James and ventolin

## 2022-12-06 NOTE — Telephone Encounter (Signed)
Rx request sent to pharmacy.  

## 2022-12-09 ENCOUNTER — Ambulatory Visit (INDEPENDENT_AMBULATORY_CARE_PROVIDER_SITE_OTHER): Payer: 59 | Admitting: Cardiology

## 2022-12-09 ENCOUNTER — Encounter (HOSPITAL_BASED_OUTPATIENT_CLINIC_OR_DEPARTMENT_OTHER): Payer: Self-pay | Admitting: Cardiology

## 2022-12-09 VITALS — BP 98/60 | HR 97 | Ht 65.0 in | Wt 167.0 lb

## 2022-12-09 DIAGNOSIS — I5042 Chronic combined systolic (congestive) and diastolic (congestive) heart failure: Secondary | ICD-10-CM

## 2022-12-09 DIAGNOSIS — I2510 Atherosclerotic heart disease of native coronary artery without angina pectoris: Secondary | ICD-10-CM | POA: Diagnosis not present

## 2022-12-09 DIAGNOSIS — E785 Hyperlipidemia, unspecified: Secondary | ICD-10-CM | POA: Diagnosis not present

## 2022-12-09 DIAGNOSIS — R0609 Other forms of dyspnea: Secondary | ICD-10-CM | POA: Diagnosis not present

## 2022-12-09 DIAGNOSIS — R61 Generalized hyperhidrosis: Secondary | ICD-10-CM

## 2022-12-09 MED ORDER — LOSARTAN POTASSIUM 25 MG PO TABS: 12.5000 mg | ORAL_TABLET | Freq: Every day | ORAL | 3 refills | Status: DC

## 2022-12-09 MED ORDER — CLOPIDOGREL BISULFATE 75 MG PO TABS: 75.0000 mg | ORAL_TABLET | Freq: Every day | ORAL | 3 refills | Status: DC

## 2022-12-09 MED ORDER — CARVEDILOL 6.25 MG PO TABS: 6.2500 mg | ORAL_TABLET | Freq: Two times a day (BID) | ORAL | 3 refills | Status: DC

## 2022-12-09 MED ORDER — NITROGLYCERIN 0.4 MG SL SUBL: 0.4000 mg | SUBLINGUAL_TABLET | SUBLINGUAL | 12 refills | Status: DC | PRN

## 2022-12-09 MED ORDER — EZETIMIBE 10 MG PO TABS: 10.0000 mg | ORAL_TABLET | Freq: Every day | ORAL | 3 refills | Status: DC

## 2022-12-09 MED ORDER — ATORVASTATIN CALCIUM 40 MG PO TABS: 40.0000 mg | ORAL_TABLET | Freq: Every day | ORAL | 3 refills | Status: DC

## 2022-12-09 MED ORDER — FUROSEMIDE 20 MG PO TABS: 20.0000 mg | ORAL_TABLET | Freq: Every day | ORAL | 3 refills | Status: DC

## 2022-12-09 MED ORDER — SPIRONOLACTONE 25 MG PO TABS: 25.0000 mg | ORAL_TABLET | Freq: Every day | ORAL | 3 refills | Status: DC

## 2022-12-09 NOTE — Progress Notes (Signed)
Cardiology Office Note:    Date:  12/09/2022   ID:  [name], DOB 07/11/1966, MRN [id number]  PCP:  Garlan Fillers, MD   [facility] Providers Cardiologist:  Chilton Si, MD     Referring MD: Garlan Fillers, MD   CC: annual follow up for CAD  History of Present Illness:    [name] is a 57 y.o. female with a hx of chronic systolic and diastolic heart failure with recovered EF (EF improved from 20% to 65%), CAD s/p PCI (DES to ostial LAD left main), severe COPD, lung cancer in remission (s/p lobectomy, XRT, chemo), colon cancer s/p resection, melanoma.  She was previously followed in the ED heart failure clinic, last evaluated by them in 2020.  She establish care with Dr. Duke Salvia on 05/13/2019, she reported chronic shortness of breath that has been persistent since her lung cancer.  She was seen on an annual basis since then, with complaints mostly associated with shortness of breath felt to be due to deconditioning, COPD, lung cancer history.  Most recently she was evaluated by Dr. Duke Salvia on 08/09/2021 at this time she was feeling relatively well.  She continued to have DOE and a Lexiscan Myoview was ordered to assess for ischemia which revealed no ischemia, low risk study.  She presents today for follow-up of her CAD.  She has been doing stable from a cardiac perspective. She is being evaluated for a "spot" on her left lung and that is causing some stress for her. Her only complaint is that she has "episodes" where she will break out in a sweat on her brow. She denies any associated dizziness or nausea. She cannot correlate her symptoms with anything specific in her mind. Her BP is on the low side today, however upon review of her chart, it has been low intermittently for some time. She had an episode while we were talking, her respiratory rate increased and she verbalized she felt sweaty on her forehead, however I did not note any diaphoresis.  Questioned if it could be anxiety driven, and she verbalized that she had not previously thought of that. She denies chest pain, palpitations, dyspnea, pnd, orthopnea, n, v, dizziness, syncope, edema, weight gain, or early satiety.   Past Medical History:  Diagnosis Date   Acute bilateral low back pain without sciatica    Acute congestive heart failure 05/02/2017   EF 20-25% on ECHO   Acute respiratory failure with hypoxia 05/01/2017   Adhesive capsulitis 05/22/2011   Allergic rhinitis    Allergy    Anxiety disorder    Aortic valve regurgitation 05/15/2017   Arm numbness 08/09/2021   Asthma    Chronic bronchitis    Chronic diastolic heart failure 12/22/2019   Colon cancer    adenocarcinoma in a polyps 11-24-2014   COPD (chronic obstructive pulmonary disease)    Cough    Dizziness and giddiness    Dyspepsia    Dyspnea 04/30/2017   Emphysema of lung    External hemorrhoids    EXTERNAL HEMORRHOIDS 09/27/2009   Female stress incontinence    Genetic testing 08/18/2016   Negative for known pathogenic mutations within any of 25 genes on a Custom Panel through [employer].  One variant of uncertain significance (VUS) called "c.7187C>G (p.Thr2396Ser)" was found in one copy of the ATM gene.  This Custom Cancer Panel offered by [employer] includes sequencing and/or duplication/deletion testing of the following 25 genes: APC, ATM, AXIN2, BAP1, BMPR1A, BRCA1, BRCA2,  CDH1, CDK4, CDKN2A, CHEK2, EPCAM, MITF, MLH1, MSH2, MSH6, MUTYH, PMS2, POLD1, POLE, PTEN, SCG5/GREM1, SMAD4, STK11, and TP53. Date of report is July 23, 2016.  MSH2 Exons 1-7 Inversion Analysis was also negative through [employer].  Date of report is July 23, 2016.      GERD (gastroesophageal reflux disease)    occ depending on diet    Goiter    History of lung cancer    Hypercholesteremia    denies-last check normal labs   Hypokalemia    Idiopathic interstitial pneumonia, not otherwise specified    Insomnia    Lung cancer     Migraine, unspecified, without mention of intractable migraine without mention of status migrainosus    Panic attacks    Seizures    very long ago like 15 years ago or so , questionable etilogy   Skin cancer (melanoma)    right calf   Snoring 12/11/2010   Longstanding hx of snore. NPSG was normal years ago.     Swelling of right lower extremity 12/01/2014    Past Surgical History:  Procedure Laterality Date   ABDOMINAL HYSTERECTOMY  2001   no BSO   biopsy of right neck mass     negative-thyroid biopsy    BREAST EXCISIONAL BIOPSY Right 1987   no visible scar   BRONCHOSCOPY  01/31/2005   nonspecific inflammation   BRONCHOSCOPY  08/24/2010   nonspecific inflammation   COLONOSCOPY     COLONOSCOPY WITH PROPOFOL N/A 02/07/2020   Procedure: COLONOSCOPY WITH PROPOFOL;  Surgeon: Benancio Deeds, MD;  Location: [facility];  Service: Gastroenterology;  Laterality: N/A;   CORONARY STENT INTERVENTION N/A 05/12/2017   Procedure: CORONARY STENT INTERVENTION;  Surgeon: Rinaldo Cloud, MD;  Location: [facility];  Service: Cardiovascular;  Laterality: N/A;   LUNG REMOVAL, PARTIAL     for lung cancer--followed by Dr. Edwyna Shell and Dr. Maple Hudson   MELANOMA EXCISION WITH SENTINEL LYMPH NODE BIOPSY Right 11/29/2014   Procedure: RIGHT INGUINAL SENTINEL LYMPH NODE BIOPSY;  Surgeon: Violeta Gelinas, MD;  Location: [facility] OR;  Service: General;  Laterality: Right;   melanoma removal     right calf    POLYPECTOMY     POLYPECTOMY  02/07/2020   Procedure: POLYPECTOMY;  Surgeon: Benancio Deeds, MD;  Location: [facility];  Service: Gastroenterology;;   portacath removed  10/2007   RIGHT/LEFT HEART CATH AND CORONARY ANGIOGRAPHY N/A 05/06/2017   Procedure: RIGHT/LEFT HEART CATH AND CORONARY ANGIOGRAPHY;  Surgeon: Orpah Cobb, MD;  Location: [facility];  Service: Cardiovascular;  Laterality: N/A;   SPIROMETRY  07/03/2002   min obstruction,mild restriction 01/31/2005   tvh  12/01/2000   hysterectomy     Current Medications: Current Meds  Medication Sig   acetaminophen (TYLENOL) 500 MG tablet Take 500 mg by mouth every 8 (eight) hours as needed for moderate pain.   acyclovir (ZOVIRAX) 800 MG tablet Take 800 mg by mouth 2 (two) times daily.   albuterol (VENTOLIN HFA) 108 (90 Base) MCG/ACT inhaler INHALE 2 PUFFS INTO THE LUNGS EVERY 4 TO 6 HOURS AS NEEDED FOR WHEEZING OR SHORTNESS OF BREATH   ALPRAZolam (XANAX) 0.5 MG tablet Take 1 tablet (0.5 mg total) by mouth 2 (two) times daily as needed for up to 1 dose for anxiety. (Patient taking differently: Take 0.5 mg by mouth 2 (two) times daily.)   Budeson-Glycopyrrol-Formoterol (BREZTRI AEROSPHERE) 160-9-4.8 MCG/ACT AERO Inhale 2 puffs into the lungs in the morning and at bedtime.   ipratropium (  ATROVENT) 0.06 % nasal spray Place 1 spray into both nostrils 3 (three) times daily.   ipratropium-albuterol (DUONEB) 0.5-2.5 (3) MG/3ML SOLN USE 1 VIAL VIA NEBULIZER EVERY 6 HOURS AS NEEDED   OXYGEN Inhale 1 L/min into the lungs as needed.   pantoprazole (PROTONIX) 40 MG tablet Take 40 mg by mouth daily.    potassium chloride (KLOR-CON) 10 MEQ tablet TAKE 1 TABLET(10 MEQ) BY MOUTH DAILY   triamcinolone cream (KENALOG) 0.1 % Apply topically 2 (two) times daily.   zolpidem (AMBIEN) 5 MG tablet 1 as needed for sleep   [DISCONTINUED] atorvastatin (LIPITOR) 40 MG tablet Take 1 tablet (40 mg total) by mouth daily.   [DISCONTINUED] carvedilol (COREG) 6.25 MG tablet TAKE 1 TABLET BY MOUTH TWICE DAILY WITH MEALS   [DISCONTINUED] clopidogrel (PLAVIX) 75 MG tablet Take 1 tablet (75 mg total) by mouth daily. Please keep your upcoming appointment for refills.   [DISCONTINUED] ezetimibe (ZETIA) 10 MG tablet Take 10 mg by mouth daily.   [DISCONTINUED] furosemide (LASIX) 20 MG tablet TAKE 1 TABLET(20 MG) BY MOUTH DAILY   [DISCONTINUED] losartan (COZAAR) 25 MG tablet TAKE 1/2 TABLET(12.5 MG) BY MOUTH DAILY   [DISCONTINUED] nitroGLYCERIN (NITROSTAT) 0.4 MG SL tablet  Place 1 tablet (0.4 mg total) under the tongue every 5 (five) minutes as needed for chest pain.   [DISCONTINUED] spironolactone (ALDACTONE) 25 MG tablet TAKE 1 TABLET(25 MG) BY MOUTH DAILY     Allergies:   Patient has no known allergies.   Social History   Socioeconomic History   Marital status: Widowed    Spouse name: Not on file   Number of children: 1   Years of education: 10   Highest education level: Not on file  Occupational History   Occupation: Disabled- Retail buyer: UNEMPLOYED  Tobacco Use   Smoking status: Former    Packs/day: 2.00    Years: 23.00    Additional pack years: 0.00    Total pack years: 46.00    Types: Cigarettes    Start date: 09/02/1978    Quit date: 10/26/2001    Years since quitting: 21.1   Smokeless tobacco: Never   Tobacco comments:    Significant passive exposure from boy friend  Vaping Use   Vaping Use: Never used  Substance and Sexual Activity   Alcohol use: No    Alcohol/week: 0.0 standard drinks of alcohol    Comment: history of alcohol abuse 20-30   Drug use: No    Comment: +MJ   Sexual activity: Not on file  Other Topics Concern   Not on file  Social History Narrative   Health Care POA:    Emergency Contact:    End of Life Plan:    Who lives with you: self   Any pets: cat, Bart   Diet: Pt has a varied diet but reports not eating much because she is afraid of weight gain.   Exercise: Pt has no regular exercise routine.   Seatbelts: Pt reports wearing seatbelt when in vehicles.    Hobbies: movies         Social Determinants of Corporate investment banker Strain: Not on file  Food Insecurity: Not on file  Transportation Needs: Not on file  Physical Activity: Not on file  Stress: Not on file  Social Connections: Not on file     Family History: The patient's family history includes Asthma in her daughter; COPD in her maternal grandmother and mother; Cancer in her paternal  grandfather; Cirrhosis in her paternal uncle;  Colon polyps in her brother; Diabetes in her maternal grandfather; Lung cancer in her father; Other in her maternal aunt and mother; Throat cancer in an other family member. There is no history of Colon cancer, Rectal cancer, or Stomach cancer.  ROS:   Please see the history of present illness.    All other systems reviewed and are negative.  EKGs/Labs/Other Studies Reviewed:    The following studies were reviewed today:  Cardiac Studies & Procedures   CARDIAC CATHETERIZATION  CARDIAC CATHETERIZATION 05/12/2017  Narrative  A STENT SIERRA 4.00 X 15 MM drug eluting stent was successfully placed.  Ost LM lesion, 70 %stenosed.  Post intervention, there is a 0% residual stenosis.  Findings Coronary Findings Diagnostic  Dominance: Right  Left Main  Intervention  Ost LM lesion Angioplasty A STENT SIERRA 4.00 X 15 MM drug eluting stent was successfully placed. Stent strut is well apposed. Post-stent angioplasty was performed using a BALLOON Sidell EMERGE MR 5.0X8. Maximum pressure: 15 atm. Inflation time: 10 sec. The pre-interventional distal flow is normal (TIMI 3).  The post-interventional distal flow is normal (TIMI 3). The intervention was successful . No complications occurred at this lesion. IVUS was performed on the lesion post PCI . There is a 0% residual stenosis post intervention.   CARDIAC CATHETERIZATION 05/06/2017  Narrative  Ost LM to LM lesion, 60 %stenosed.  Prox RCA lesion, 15 %stenosed.  LV end diastolic pressure is normal.  Left subclavian injection showed patent LIMA. CVTS consult for significant LM disease.  Findings Coronary Findings Diagnostic  Dominance: Right  Left Main Vessel was injected. Culprit lesion. The lesion is type A, concentric and smooth. The lesion was not previously treated. The stenosis was measured by a visual reading. Pressure wire/FFR was not performed on the lesion. IVUS was not performed on the lesion.  Left Anterior  Descending Vessel was injected. Vessel is normal in caliber. Vessel is angiographically normal.  Ramus Intermedius Vessel was injected. Vessel is large. Vessel is angiographically normal.  Left Circumflex Vessel was injected. Vessel is normal in caliber. Vessel is angiographically normal.  Right Coronary Artery Vessel was injected. Vessel is large. Not the culprit lesion. The lesion is type A and eccentric. The lesion was not previously treated. The stenosis was measured by a visual reading. Pressure wire/FFR was not performed on the lesion. IVUS was not performed on the lesion.  Intervention  No interventions have been documented.   STRESS TESTS  MYOCARDIAL PERFUSION IMAGING 08/28/2021  Narrative   The study is normal. The study is low risk.   No ST deviation was noted.   LV perfusion is normal. There is no evidence of ischemia. There is no evidence of infarction.   Left ventricular function is normal. Nuclear stress EF: 62 %. The left ventricular ejection fraction is normal (55-65%). End diastolic cavity size is normal.   Prior study not available for comparison.  Reduced apical counts with normal wall motion consistent with apical thinning artifact. Normal study without ischemia or infarction. Normal LVEF, 62%. This is a low-risk study.   ECHOCARDIOGRAM  ECHOCARDIOGRAM COMPLETE 09/16/2018  Narrative *[place] [place]* *[facility]* [address] [place], [place] [address] [phone]  ------------------------------------------------------------------- Transthoracic Echocardiography  Patient:    Elfa, Wooton MR #:       [id number] Study Date: 09/16/2018 Gender:     F Age:        52 Height:     165.1 cm  Weight:     75.3 kg BSA:        1.88 m^2 Pt. Status: Room:  Yolonda Kida [id number] ATTENDING    Marca Ancona, M.D. ORDERING     Marca Ancona, M.D. PERFORMING   [facility], Outpatient SONOGRAPHER  Celene Skeen,  RDCS  cc:  ------------------------------------------------------------------- LV EF: 65% -   70%  ------------------------------------------------------------------- History:   PMH:  CHF I50.9.  Coronary artery disease.  Chronic obstructive pulmonary disease.  Risk factors:  Cardiomyopathy.  ------------------------------------------------------------------- Study Conclusions  - Left ventricle: The cavity size was normal. Wall thickness was normal. Systolic function was vigorous. The estimated ejection fraction was in the range of 65% to 70%. Doppler parameters are consistent with abnormal left ventricular relaxation (grade 1 diastolic dysfunction).  ------------------------------------------------------------------- Study data:  Comparison was made to the study of 08/15/2017.  Study status:  Routine.  Procedure:  The patient reported no pain pre or post test. Transthoracic echocardiography. Image quality was adequate.          Transthoracic echocardiography.  M-mode, complete 2D, spectral Doppler, and color Doppler.  Birthdate: Patient birthdate: 1966/05/25.  Age:  Patient is 57 yr old.  Sex: Gender: female.    BMI: 27.6 kg/m^2.  Blood pressure:     95/72 Patient status:  Inpatient.  Study date:  Study date: 09/16/2018. Study time: 02:01 PM.  Location:  Echo laboratory.  -------------------------------------------------------------------  ------------------------------------------------------------------- Left ventricle:  The cavity size was normal. Wall thickness was normal. Systolic function was vigorous. The estimated ejection fraction was in the range of 65% to 70%. Doppler parameters are consistent with abnormal left ventricular relaxation (grade 1 diastolic dysfunction).  ------------------------------------------------------------------- Aortic valve:  Poorly visualized.  Mildly thickened leaflets. Sclerosis without stenosis.  Doppler:  There was no  regurgitation.  ------------------------------------------------------------------- Aorta:  Aortic root: The aortic root was normal in size. Ascending aorta: The ascending aorta was normal in size.  ------------------------------------------------------------------- Mitral valve:   Structurally normal valve.   Leaflet separation was normal.  Doppler:  Transvalvular velocity was within the normal range. There was no evidence for stenosis. There was trivial regurgitation.    Peak gradient (D): 3 mm Hg.  ------------------------------------------------------------------- Left atrium:  The atrium was normal in size.  ------------------------------------------------------------------- Right ventricle:  The cavity size was normal. Wall thickness was normal. Systolic function was normal.  ------------------------------------------------------------------- Tricuspid valve:   Structurally normal valve.   Leaflet separation was normal.  Doppler:  Transvalvular velocity was within the normal range. There was trivial regurgitation.  ------------------------------------------------------------------- Right atrium:  The atrium was normal in size.  ------------------------------------------------------------------- Pericardium:  There was no pericardial effusion.  ------------------------------------------------------------------- Systemic veins: Inferior vena cava: The vessel was normal in size. The respirophasic diameter changes were in the normal range (>= 50%), consistent with normal central venous pressure.  ------------------------------------------------------------------- Measurements  Left ventricle                            Value        Reference LV ID, ED, PLAX chordal         (L)       33.9  mm     43 - 52 LV ID, ES, PLAX chordal         (L)       22.6  mm     23 - 38 LV fx shortening, PLAX chordal  33    %      >=29 LV PW thickness, ED                       10.5   mm     ---------- IVS/LV PW ratio, ED                       1.11         <=1.3 Stroke volume, 2D                         44    ml     ---------- Stroke volume/bsa, 2D                     23    ml/m^2 ----------  Ventricular septum                        Value        Reference IVS thickness, ED                         11.7  mm     ----------  LVOT                                      Value        Reference LVOT ID, S                                18    mm     ---------- LVOT area                                 2.54  cm^2   ---------- LVOT peak velocity, S                     112   cm/s   ---------- LVOT mean velocity, S                     75.4  cm/s   ---------- LVOT VTI, S                               17.2  cm     ---------- LVOT peak gradient, S                     5     mm Hg  ----------  Aorta                                     Value        Reference Aortic root ID, ED                        23    mm     ----------  Left atrium  Value        Reference LA ID, A-P, ES                            35    mm     ---------- LA ID/bsa, A-P                            1.86  cm/m^2 <=2.2 LA volume, S                              27.7  ml     ---------- LA volume/bsa, S                          14.8  ml/m^2 ---------- LA volume, ES, 1-p A4C                    23.1  ml     ---------- LA volume/bsa, ES, 1-p A4C                12.3  ml/m^2 ---------- LA volume, ES, 1-p A2C                    27.5  ml     ---------- LA volume/bsa, ES, 1-p A2C                14.7  ml/m^2 ----------  Mitral valve                              Value        Reference Mitral E-wave peak velocity               85.9  cm/s   ---------- Mitral deceleration time                  201   ms     150 - 230 Mitral peak gradient, D                   3     mm Hg  ----------  Right atrium                              Value        Reference RA ID, S-I, ES, A4C                       47.2  mm     34 -  49 RA area, ES, A4C                          13.4  cm^2   8.3 - 19.5 RA volume, ES, A/L                        30.3  ml     ---------- RA volume/bsa, ES, A/L                    16.1  ml/m^2 ----------  Right ventricle  Value        Reference RV ID, minor axis, ED, A4C base           32.6  mm     ---------- RV ID, minor axis, ED, A4C mid            35    mm     ---------- RV ID, major axis, ED, A4C                69.3  mm     55 - 91 TAPSE                                     11.7  mm     ---------- RV s&', lateral, S                         9.79  cm/s   ----------  Legend: (L)  and  (H)  mark values outside specified reference range.  ------------------------------------------------------------------- Prepared and Electronically Authenticated by  Nicholes Mango, MD 2020-01-15T15:31:46              EKG:  EKG is  ordered today.  The ekg ordered today demonstrates SR, HR 97 bpm, consistent with prior EKG tracings.   Recent Labs: 11/14/2022: ALT 11; BUN 13; Creatinine 0.80; Hemoglobin 12.7; Platelet Count 208; Potassium 3.6; Sodium 139  Recent Lipid Panel    Component Value Date/Time   CHOL 135 08/09/2021 1006   TRIG 129 08/09/2021 1006   HDL 31 (L) 08/09/2021 1006   CHOLHDL 4.4 08/09/2021 1006   CHOLHDL 3.5 07/28/2018 1427   VLDL 19 07/28/2018 1427   LDLCALC 81 08/09/2021 1006   LDLDIRECT 137 (H) 09/27/2009 2057     Risk Assessment/Calculations:                Physical Exam:    VS:  BP 98/60   Pulse 97   Ht 5\' 5"  (1.651 m)   Wt 167 lb (75.8 kg)   BMI 27.79 kg/m     Wt Readings from Last 3 Encounters:  12/09/22 167 lb (75.8 kg)  12/06/22 167 lb (75.8 kg)  11/14/22 164 lb 6.4 oz (74.6 kg)     GEN:  Well nourished, well developed in no acute distress HEENT: Normal NECK: No JVD; No carotid bruits LYMPHATICS: No lymphadenopathy CARDIAC: RRR, no murmurs, rubs, gallops RESPIRATORY:  Clear to auscultation without rales, wheezing or  rhonchi  ABDOMEN: Soft, non-tender, non-distended MUSCULOSKELETAL:  No edema; No deformity  SKIN: Warm and dry NEUROLOGIC:  Alert and oriented x 3 PSYCHIATRIC:  Normal affect   ASSESSMENT:    1. Coronary artery disease involving native coronary artery of native heart without angina pectoris   2. Hyperlipidemia LDL goal <70   3. DOE (dyspnea on exertion)   4. Chronic combined systolic and diastolic heart failure   5. Diaphoresis    PLAN:    In order of problems listed above:  CAD -Lexiscan on 08/28/2021 revealed no ischemia, DES to ostial left main in 2018. Stable with no anginal symptoms. No indication for ischemic evaluation.  Heart healthy diet and regular cardiovascular exercise encouraged. Continue ASA 81 mg daily, Coreg 6.25 mg twice daily, Lipitor 40 mg daily, Plavix 75 mg daily.   Chronic diastolic heart failure - echo 1610 EF 65-70%, grade I DD. NYHA class I today. Euvolemic. Continue Losartan 12.5  mg daily, Coreg 6.25 mg twice daily, Spironolactone 25 mg daily. BP was marginally low, discussed stopping her Losartan as she has unexplained episodes of diaphoresis that could be associated with orthostasis, however she does not want to change any of her medications today. Encouraged to monitor BP at home.   HLD - LDL on 08/09/21 was 84, recent LFTs were ok, will repeat direct LDL today. Continue Lipitor 40 mg daily--states higher dose caused her to feel weak, Zetia 10 mg daily.   Inexplicable diaphoresis - Pt reports that she has had episodes of sudden onset of diaphoresis over the last year. She denies dizziness, presyncope sensations, denies CP, or other CP related symptoms, ischemic evaluation last year was low risk. Her BP is marginally low, advised her to check her BP at home during her episodes. She did have an "episode" during our visit today, she was slightly tachypneic, although I did not appreciate any diaphoresis. Questioned if there could be a component of anxiety, pt advised  she did not think so but did report that she suffers from anxiety. Continue to follow with PCP.   Disposition - direct LDL today, monitor BP at home, return in 3 months.              Medication Adjustments/Labs and Tests Ordered: Current medicines are reviewed at length with the patient today.  Concerns regarding medicines are outlined above.  Orders Placed This Encounter  Procedures   LDL cholesterol, direct   EKG 12-Lead   Meds ordered this encounter  Medications   atorvastatin (LIPITOR) 40 MG tablet    Sig: Take 1 tablet (40 mg total) by mouth daily.    Dispense:  90 tablet    Refill:  3   carvedilol (COREG) 6.25 MG tablet    Sig: Take 1 tablet (6.25 mg total) by mouth 2 (two) times daily with a meal.    Dispense:  180 tablet    Refill:  3   clopidogrel (PLAVIX) 75 MG tablet    Sig: Take 1 tablet (75 mg total) by mouth daily. Please keep your upcoming appointment for refills.    Dispense:  90 tablet    Refill:  3    **Patient requests 90 days supply**   ezetimibe (ZETIA) 10 MG tablet    Sig: Take 1 tablet (10 mg total) by mouth daily.    Dispense:  90 tablet    Refill:  3   losartan (COZAAR) 25 MG tablet    Sig: Take 0.5 tablets (12.5 mg total) by mouth daily.    Dispense:  45 tablet    Refill:  3   spironolactone (ALDACTONE) 25 MG tablet    Sig: Take 1 tablet (25 mg total) by mouth daily.    Dispense:  90 tablet    Refill:  3   furosemide (LASIX) 20 MG tablet    Sig: Take 1 tablet (20 mg total) by mouth daily.    Dispense:  90 tablet    Refill:  3   nitroGLYCERIN (NITROSTAT) 0.4 MG SL tablet    Sig: Place 1 tablet (0.4 mg total) under the tongue every 5 (five) minutes as needed for chest pain.    Dispense:  30 tablet    Refill:  12    Patient Instructions  Medication Instructions:  Continue your current medications.   If you have persistent lightheadedness or dizziness, we may consider stopping your Losartan.   *If you need a refill on your cardiac  medications before  your next appointment, please call your pharmacy*   Lab Work: Your physician recommends that you return for lab work today: direct LDL  If you have labs (blood work) drawn today and your tests are completely normal, you will receive your results only by: MyChart Message (if you have MyChart) OR A paper copy in the mail If you have any lab test that is abnormal or we need to change your treatment, we will call you to review the results.   Testing/Procedures: Your EKG today was stable.    Follow-Up: At [facility], you and your health needs are our priority.  As part of our continuing mission to provide you with exceptional heart care, we have created designated Provider Care Teams.  These Care Teams include your primary Cardiologist (physician) and Advanced Practice Providers (APPs -  Physician Assistants and Nurse Practitioners) who all work together to provide you with the care you need, when you need it.  We recommend signing up for the patient portal called "MyChart".  Sign up information is provided on this After Visit Summary.  MyChart is used to connect with patients for Virtual Visits (Telemedicine).  Patients are able to view lab/test results, encounter notes, upcoming appointments, etc.  Non-urgent messages can be sent to your provider as well.   To learn more about what you can do with MyChart, go to [email or web address].    Your next appointment:   3 month(s)  Provider:   Chilton Si, MD or Advanced Practice Provider   Other Instructions  Tips to Measure your Blood Pressure Correctly  Check your blood pressure during an episode   Here's what you can do to ensure a correct reading:  Don't drink a caffeinated beverage or smoke during the 30 minutes before the test.  Sit quietly for five minutes before the test begins.  During the measurement, sit in a chair with your feet on the floor and your arm supported so your elbow is at about  heart level.  The inflatable part of the cuff should completely cover at least 80% of your upper arm, and the cuff should be placed on bare skin, not over a shirt.  Don't talk during the measurement.   Blood pressure categories  Blood pressure category SYSTOLIC (upper number)  DIASTOLIC (lower number)  Normal Less than 120 mm Hg and Less than 80 mm Hg  Elevated 120-129 mm Hg and Less than 80 mm Hg  High blood pressure: Stage 1 hypertension 130-139 mm Hg or 80-89 mm Hg  High blood pressure: Stage 2 hypertension 140 mm Hg or higher or 90 mm Hg or higher  Hypertensive crisis (consult your doctor immediately) Higher than 180 mm Hg and/or Higher than 120 mm Hg  Source: American Heart Association and American Stroke Association. For more on getting your blood pressure under control, buy Controlling Your Blood Pressure, a Special Health Report from [facility].   Blood Pressure Log   Date   Time  Blood Pressure  Position  Example: Nov 1 9 AM 124/78 sitting                                                        Signed, Flossie Dibble, NP  12/09/2022 4:19 PM    [facility]

## 2022-12-09 NOTE — Patient Instructions (Addendum)
Medication Instructions:  Continue your current medications.   If you have persistent lightheadedness or dizziness, we may consider stopping your Losartan.   *If you need a refill on your cardiac medications before your next appointment, please call your pharmacy*   Lab Work: Your physician recommends that you return for lab work today: direct LDL  If you have labs (blood work) drawn today and your tests are completely normal, you will receive your results only by: MyChart Message (if you have MyChart) OR A paper copy in the mail If you have any lab test that is abnormal or we need to change your treatment, we will call you to review the results.   Testing/Procedures: Your EKG today was stable.    Follow-Up: At [facility], you and your health needs are our priority.  As part of our continuing mission to provide you with exceptional heart care, we have created designated Provider Care Teams.  These Care Teams include your primary Cardiologist (physician) and Advanced Practice Providers (APPs -  Physician Assistants and Nurse Practitioners) who all work together to provide you with the care you need, when you need it.  We recommend signing up for the patient portal called "MyChart".  Sign up information is provided on this After Visit Summary.  MyChart is used to connect with patients for Virtual Visits (Telemedicine).  Patients are able to view lab/test results, encounter notes, upcoming appointments, etc.  Non-urgent messages can be sent to your provider as well.   To learn more about what you can do with MyChart, go to [email or web address].    Your next appointment:   3 month(s)  Provider:   Chilton Si, MD or Advanced Practice Provider   Other Instructions  Tips to Measure your Blood Pressure Correctly  Check your blood pressure during an episode   Here's what you can do to ensure a correct reading:  Don't drink a caffeinated beverage or smoke during the  30 minutes before the test.  Sit quietly for five minutes before the test begins.  During the measurement, sit in a chair with your feet on the floor and your arm supported so your elbow is at about heart level.  The inflatable part of the cuff should completely cover at least 80% of your upper arm, and the cuff should be placed on bare skin, not over a shirt.  Don't talk during the measurement.   Blood pressure categories  Blood pressure category SYSTOLIC (upper number)  DIASTOLIC (lower number)  Normal Less than 120 mm Hg and Less than 80 mm Hg  Elevated 120-129 mm Hg and Less than 80 mm Hg  High blood pressure: Stage 1 hypertension 130-139 mm Hg or 80-89 mm Hg  High blood pressure: Stage 2 hypertension 140 mm Hg or higher or 90 mm Hg or higher  Hypertensive crisis (consult your doctor immediately) Higher than 180 mm Hg and/or Higher than 120 mm Hg  Source: American Heart Association and American Stroke Association. For more on getting your blood pressure under control, buy Controlling Your Blood Pressure, a Special Health Report from [facility].   Blood Pressure Log   Date   Time  Blood Pressure  Position  Example: Nov 1 9 AM 124/78 sitting

## 2022-12-10 ENCOUNTER — Other Ambulatory Visit: Payer: Self-pay | Admitting: Internal Medicine

## 2022-12-10 ENCOUNTER — Telehealth: Payer: Self-pay | Admitting: Internal Medicine

## 2022-12-10 LAB — LDL CHOLESTEROL, DIRECT: LDL Direct: 71 mg/dL (ref 0–99)

## 2022-12-10 MED ORDER — ZOLPIDEM TARTRATE 5 MG PO TABS: ORAL_TABLET | ORAL | 5 refills | Status: DC

## 2022-12-10 NOTE — Telephone Encounter (Signed)
Pt called the office stating that she has been trying to get her ambien from the pharmacy but they said they could not fill it due to how the instructions was worded.  Dr. Maple Hudson, please advise on this as pt needs her medication.  Pharmacy that this needs to go to is [facility] off [address] and [address]   No Known Allergies   Current Outpatient Medications:    acetaminophen (TYLENOL) 500 MG tablet, Take 500 mg by mouth every 8 (eight) hours as needed for moderate pain., Disp: , Rfl:    acyclovir (ZOVIRAX) 800 MG tablet, Take 800 mg by mouth 2 (two) times daily., Disp: , Rfl:    albuterol (VENTOLIN HFA) 108 (90 Base) MCG/ACT inhaler, INHALE 2 PUFFS INTO THE LUNGS EVERY 4 TO 6 HOURS AS NEEDED FOR WHEEZING OR SHORTNESS OF BREATH, Disp: 54 g, Rfl: 4   ALPRAZolam (XANAX) 0.5 MG tablet, Take 1 tablet (0.5 mg total) by mouth 2 (two) times daily as needed for up to 1 dose for anxiety. (Patient taking differently: Take 0.5 mg by mouth 2 (two) times daily.), Disp: 40 tablet, Rfl: 0   atorvastatin (LIPITOR) 40 MG tablet, Take 1 tablet (40 mg total) by mouth daily., Disp: 90 tablet, Rfl: 3   Budeson-Glycopyrrol-Formoterol (BREZTRI AEROSPHERE) 160-9-4.8 MCG/ACT AERO, Inhale 2 puffs into the lungs in the morning and at bedtime., Disp: 32.1 g, Rfl: 3   carvedilol (COREG) 6.25 MG tablet, Take 1 tablet (6.25 mg total) by mouth 2 (two) times daily with a meal., Disp: 180 tablet, Rfl: 3   clopidogrel (PLAVIX) 75 MG tablet, Take 1 tablet (75 mg total) by mouth daily. Please keep your upcoming appointment for refills., Disp: 90 tablet, Rfl: 3   ezetimibe (ZETIA) 10 MG tablet, Take 1 tablet (10 mg total) by mouth daily., Disp: 90 tablet, Rfl: 3   furosemide (LASIX) 20 MG tablet, Take 1 tablet (20 mg total) by mouth daily., Disp: 90 tablet, Rfl: 3   ipratropium (ATROVENT) 0.06 % nasal spray, Place 1 spray into both nostrils 3 (three) times daily., Disp: , Rfl:    ipratropium-albuterol (DUONEB) 0.5-2.5 (3) MG/3ML SOLN,  USE 1 VIAL VIA NEBULIZER EVERY 6 HOURS AS NEEDED, Disp: 90 mL, Rfl: 3   losartan (COZAAR) 25 MG tablet, Take 0.5 tablets (12.5 mg total) by mouth daily., Disp: 45 tablet, Rfl: 3   nitroGLYCERIN (NITROSTAT) 0.4 MG SL tablet, Place 1 tablet (0.4 mg total) under the tongue every 5 (five) minutes as needed for chest pain., Disp: 30 tablet, Rfl: 12   OXYGEN, Inhale 1 L/min into the lungs as needed., Disp: , Rfl:    pantoprazole (PROTONIX) 40 MG tablet, Take 40 mg by mouth daily. , Disp: , Rfl:    potassium chloride (KLOR-CON) 10 MEQ tablet, TAKE 1 TABLET(10 MEQ) BY MOUTH DAILY, Disp: 90 tablet, Rfl: 4   spironolactone (ALDACTONE) 25 MG tablet, Take 1 tablet (25 mg total) by mouth daily., Disp: 90 tablet, Rfl: 3   triamcinolone cream (KENALOG) 0.1 %, Apply topically 2 (two) times daily., Disp: , Rfl:    zolpidem (AMBIEN) 5 MG tablet, 1 as needed for sleep, Disp: 93 tablet, Rfl: 1

## 2022-12-10 NOTE — Telephone Encounter (Signed)
I res-sent the [employer]. Drug store wouldn't accept 3 month prescription.

## 2022-12-11 NOTE — Telephone Encounter (Signed)
Spoke with patient she advises she has not been able to get Ambien prescription., I called patient pharmacy they advised it was too early for patient to get refill since last script was a 90 day supply but advise she can get it filled today. I have called to advise patient. NFN

## 2023-01-02 ENCOUNTER — Ambulatory Visit (HOSPITAL_COMMUNITY)
Admission: RE | Admit: 2023-01-02 | Discharge: 2023-01-02 | Disposition: A | Discharge status: Home / Self Care | Payer: 59 | Source: Ambulatory Visit | Attending: Internal Medicine | Admitting: Internal Medicine

## 2023-01-02 DIAGNOSIS — R911 Solitary pulmonary nodule: Secondary | ICD-10-CM

## 2023-01-03 ENCOUNTER — Encounter (HOSPITAL_COMMUNITY): Payer: Self-pay

## 2023-01-03 ENCOUNTER — Ambulatory Visit (HOSPITAL_COMMUNITY): Payer: 59

## 2023-01-08 NOTE — Assessment & Plan Note (Signed)
She continues to benefit from sleep with oxygen 2-3 L

## 2023-01-08 NOTE — Assessment & Plan Note (Signed)
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  She remains concerned. Plan-with pending chest CT, I have asked her to mark the site with a metal marker under tape and bring it to the attention of the technologist so it can be imaged.  Otherwise it flattens out when she is lying on it.

## 2023-01-08 NOTE — Assessment & Plan Note (Signed)
Controlled with routine use of Breztri and she has her rescue inhaler and nebulizer with DuoNeb as needed.

## 2023-01-08 NOTE — Assessment & Plan Note (Signed)
Ambien remains helpful when needed

## 2023-01-30 ENCOUNTER — Other Ambulatory Visit (HOSPITAL_BASED_OUTPATIENT_CLINIC_OR_DEPARTMENT_OTHER): Payer: Self-pay | Admitting: Cardiovascular Disease

## 2023-01-30 DIAGNOSIS — I5042 Chronic combined systolic (congestive) and diastolic (congestive) heart failure: Secondary | ICD-10-CM

## 2023-01-30 DIAGNOSIS — I2510 Atherosclerotic heart disease of native coronary artery without angina pectoris: Secondary | ICD-10-CM

## 2023-01-30 NOTE — Telephone Encounter (Signed)
Rx request sent to pharmacy.  

## 2023-02-13 ENCOUNTER — Other Ambulatory Visit: Payer: Self-pay | Admitting: Internal Medicine

## 2023-03-03 ENCOUNTER — Other Ambulatory Visit: Payer: Self-pay | Admitting: Cardiovascular Disease

## 2023-03-03 DIAGNOSIS — I2510 Atherosclerotic heart disease of native coronary artery without angina pectoris: Secondary | ICD-10-CM

## 2023-03-03 NOTE — Telephone Encounter (Signed)
Rx(s) sent to pharmacy electronically.  

## 2023-03-11 ENCOUNTER — Ambulatory Visit (HOSPITAL_BASED_OUTPATIENT_CLINIC_OR_DEPARTMENT_OTHER): Payer: 59 | Admitting: Family

## 2023-03-11 ENCOUNTER — Other Ambulatory Visit: Payer: Self-pay | Admitting: Cardiovascular Disease

## 2023-03-11 DIAGNOSIS — I2510 Atherosclerotic heart disease of native coronary artery without angina pectoris: Secondary | ICD-10-CM

## 2023-03-11 DIAGNOSIS — I5042 Chronic combined systolic (congestive) and diastolic (congestive) heart failure: Secondary | ICD-10-CM

## 2023-03-11 NOTE — Telephone Encounter (Signed)
Rx request sent to pharmacy.  

## 2023-03-14 ENCOUNTER — Other Ambulatory Visit: Payer: Self-pay | Admitting: Internal Medicine

## 2023-04-01 ENCOUNTER — Encounter: Payer: Self-pay | Admitting: Internal Medicine

## 2023-04-01 ENCOUNTER — Ambulatory Visit (INDEPENDENT_AMBULATORY_CARE_PROVIDER_SITE_OTHER): Payer: 59 | Admitting: Internal Medicine

## 2023-04-01 VITALS — BP 94/64 | HR 92 | Temp 98.2°F | Ht 65.0 in | Wt 170.2 lb

## 2023-04-01 DIAGNOSIS — J441 Chronic obstructive pulmonary disease with (acute) exacerbation: Secondary | ICD-10-CM

## 2023-04-01 MED ORDER — AZITHROMYCIN 250 MG PO TABS: 250.0000 mg | ORAL_TABLET | Freq: Every day | ORAL | 0 refills | Status: DC

## 2023-04-01 MED ORDER — PREDNISONE 20 MG PO TABS: 40.0000 mg | ORAL_TABLET | Freq: Every day | ORAL | 0 refills | Status: DC

## 2023-04-01 NOTE — Progress Notes (Signed)
[name]    [id number]    Aug 16, 1966  Primary Care Physician:Paterson, Barry Dienes, MD Date of Appointment: 04/01/2023 Established Patient Visit  Chief complaint:   Chief Complaint  Patient presents with   Acute Visit    Pt complains of SOB, wheezing, coughing (Clear and yellow sputum), and chest pain x4 days. Pt request dosage increase in nebulizer solution.      HPI: [name] is a 57 y.o. woman with history of COPD, PLCH, history of tobacco use, on home oxygen. Additional history of lung cancer s/p RUL lobectomy and some radiation fibrosis.    Interval Updates: Here for acute visit for shortness of breath, wheezing and coughing (productive with yellow mucus) for the past four days . Not sleeping well.  No fevers.   Taking albuterol nebulizer three times daily and on breztri 2 puffs twice a day.  Appetite is down a little bit.   Oxygen saturations at home staying over 90%.   No sick contacts.  I have reviewed the patient's family social and past medical history and updated as appropriate.   Past Medical History:  Diagnosis Date   Acute bilateral low back pain without sciatica    Acute congestive heart failure (HCC) 05/02/2017   EF 20-25% on ECHO   Acute respiratory failure with hypoxia (HCC) 05/01/2017   Adhesive capsulitis 05/22/2011   Allergic rhinitis    Allergy    Anxiety disorder    Aortic valve regurgitation 05/15/2017   Arm numbness 08/09/2021   Asthma    Chronic bronchitis    Chronic diastolic heart failure (HCC) 12/22/2019   Colon cancer (HCC)    adenocarcinoma in a polyps 11-24-2014   COPD (chronic obstructive pulmonary disease) (HCC)    Cough    Dizziness and giddiness    Dyspepsia    Dyspnea 04/30/2017   Emphysema of lung [facility])    External hemorrhoids    EXTERNAL HEMORRHOIDS 09/27/2009   Female stress incontinence    Genetic testing 08/18/2016   Negative for known pathogenic mutations within any of 25 genes on a Custom Panel  through [employer].  One variant of uncertain significance (VUS) called "c.7187C>G (p.Thr2396Ser)" was found in one copy of the ATM gene.  This Custom Cancer Panel offered by [employer] includes sequencing and/or duplication/deletion testing of the following 25 genes: APC, ATM, AXIN2, BAP1, BMPR1A, BRCA1, BRCA2, CDH1, CDK4, CDKN2A, CHEK2, EPCAM, MITF, MLH1, MSH2, MSH6, MUTYH, PMS2, POLD1, POLE, PTEN, SCG5/GREM1, SMAD4, STK11, and TP53. Date of report is July 23, 2016.  MSH2 Exons 1-7 Inversion Analysis was also negative through [employer].  Date of report is July 23, 2016.      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 NPSG was normal years ago.     Swelling of right lower extremity 12/01/2014  Past Surgical History:  Procedure Laterality Date   ABDOMINAL HYSTERECTOMY  2001   no BSO   biopsy of right neck mass     negative-thyroid biopsy    BREAST EXCISIONAL BIOPSY Right 1987   no visible scar   BRONCHOSCOPY  01/31/2005   nonspecific inflammation   BRONCHOSCOPY  08/24/2010   nonspecific inflammation   COLONOSCOPY     COLONOSCOPY WITH PROPOFOL N/A 02/07/2020   Procedure: COLONOSCOPY WITH PROPOFOL;  Surgeon: Benancio Deeds, MD;  Location: [facility];  Service: Gastroenterology;  Laterality: N/A;   CORONARY STENT INTERVENTION N/A 05/12/2017   Procedure: CORONARY STENT INTERVENTION;  Surgeon: Rinaldo Cloud, MD;  Location: [facility];   Service: Cardiovascular;  Laterality: N/A;   LUNG REMOVAL, PARTIAL     for lung cancer--followed by Dr. Edwyna Shell and Dr. Maple Hudson   MELANOMA EXCISION WITH SENTINEL LYMPH NODE BIOPSY Right 11/29/2014   Procedure: RIGHT INGUINAL SENTINEL LYMPH NODE BIOPSY;  Surgeon: Violeta Gelinas, MD;  Location: [facility] OR;  Service: General;  Laterality: Right;   melanoma removal     right calf    POLYPECTOMY     POLYPECTOMY  02/07/2020   Procedure: POLYPECTOMY;  Surgeon: Benancio Deeds, MD;  Location: [facility];  Service: Gastroenterology;;   portacath removed  10/2007   RIGHT/LEFT HEART CATH AND CORONARY ANGIOGRAPHY N/A 05/06/2017   Procedure: RIGHT/LEFT HEART CATH AND CORONARY ANGIOGRAPHY;  Surgeon: Orpah Cobb, MD;  Location: [facility];  Service: Cardiovascular;  Laterality: N/A;   SPIROMETRY  07/03/2002   min obstruction,mild restriction 01/31/2005   tvh  12/01/2000   hysterectomy    Family History  Problem Relation Age of Onset   Lung cancer Father        smoker and worked at a Retail banker   COPD Mother        not a smoker   Other Mother        hx of hysterectomy for unspecified reason   Asthma Daughter    Diabetes Maternal Grandfather    Colon polyps Brother        approx 16 polyps on his first colonoscopy   Other Maternal Aunt        non-cancerous growth in lungs; respiratory issues; smoker   COPD Maternal Grandmother        d. 25   Cancer Paternal Grandfather        oral/mouth cancer, chewed tobacco, dx at older age   Throat cancer Other        maternal great aunt (MGF's sister); not a smoker   Cirrhosis Paternal Uncle        hx of alcohol abuse    Colon cancer Neg Hx    Rectal cancer Neg Hx    Stomach cancer Neg Hx     Social History   Occupational History   Occupation: Disabled- Retail buyer: UNEMPLOYED  Tobacco Use   Smoking status: Former    Current packs/day: 0.00    Average packs/day: 2.0 packs/day for 23.1 years (46.3 ttl pk-yrs)    Types: Cigarettes     Start date: 09/02/1978    Quit date: 10/26/2001    Years since quitting: 21.4   Smokeless tobacco: Never   Tobacco comments:    Significant passive exposure from boy friend  Vaping Use   Vaping status: Never Used  Substance and Sexual Activity   Alcohol use: No    Alcohol/week: 0.0 standard drinks of alcohol  Comment: history of alcohol abuse 20-30   Drug use: No    Comment: +MJ   Sexual activity: Not on file     Physical Exam: Blood pressure 94/64, pulse 92, temperature 98.2 F (36.8 C), temperature source Oral, height 5\' 5"  (1.651 m), weight 170 lb 3.2 oz (77.2 kg), SpO2 91%.  Gen:      No acute distress Lungs:    tachypnic, frequent coughing with +end expiratory wheeze CV:         Tachycardic, regular   Data Reviewed: Imaging: I have personally reviewed the CT Chest May 2024 - 4mm LUL nodule, emphysema, radiaation fibrosis.   PFTs:     Latest Ref Rng & Units 06/17/2017    9:40 AM 05/08/2017    2:57 PM  PFT Results  FVC-Pre L 1.58  1.51   FVC-Predicted Pre % 43  41   FVC-Post L 1.73  1.67   FVC-Predicted Post % 47  46   Pre FEV1/FVC % % 62  60   Post FEV1/FCV % % 64  61   FEV1-Pre L 0.98  0.91   FEV1-Predicted Pre % 34  32   FEV1-Post L 1.10  1.02   DLCO uncorrected ml/min/mmHg 8.22    DLCO UNC% % 32    DLCO corrected ml/min/mmHg 8.41    DLCO COR %Predicted % 32    DLVA Predicted % 60    TLC L 4.03    TLC % Predicted % 77    RV % Predicted % 122     I have personally reviewed the patient's PFTs and very severe airflow limitation FEV1 32% of predicted  Labs: Lab Results  Component Value Date   WBC 8.3 11/14/2022   HGB 12.7 11/14/2022   HCT 36.6 11/14/2022   MCV 85.1 11/14/2022   PLT 208 11/14/2022   Lab Results  Component Value Date   NA 139 11/14/2022   K 3.6 11/14/2022   CL 106 11/14/2022   CO2 27 11/14/2022    Immunization status: Immunization History  Administered Date(s) Administered   H1N1 08/05/2008   Influenza Split 06/25/2011,  05/12/2012   Influenza Whole 06/03/2008, 07/13/2009, 06/25/2010   Influenza, Quadrivalent, Recombinant, Inj, Pf 05/06/2019, 06/07/2020, 09/06/2021   Influenza,inj,Quad PF,6+ Mos 05/13/2013, 06/24/2014, 06/07/2015, 06/12/2016, 04/30/2017, 06/23/2018, 06/06/2022   Influenza-Unspecified 05/07/2019, 06/02/2021   Moderna Sars-Covid-2 Vaccination 12/09/2019, 01/06/2020, 09/14/2020   Pneumococcal Conjugate-13 01/31/2014   Pneumococcal Polysaccharide-23 06/23/2009, 06/14/2019   Td 12/31/2001   Tdap 05/13/2013    External Records Personally Reviewed:   Assessment:  COPD with Acute exacerbation Chronic respiratory failure on home oxygen  Plan/Recommendations: Prednisone, antibiotics. She has no known allergies.  Continue breztri prn albuterol through nebulizer treatment. Take albuterol up to 4 times daily.   Might benefit from daily scheduled ppx with azithromycin for copd exacerbation if recurrent exacerbations.     Return to Care: Return in about 3 months (around 07/02/2023).   Durel Salts, MD Pulmonary and Critical Care Medicine [facility] Office:[phone]

## 2023-04-01 NOTE — Patient Instructions (Signed)
Follow up in 3 months with Dr. Maple Hudson.  I am sending prednisone and an antibiotic to your pharmacy.   Continue your breztri 2 puffs twice daily, gargle after use.   Continue your albuterol nebulizer treatments - you can take these up to 4 times a day.   Hope you feel better soon!

## 2023-05-07 ENCOUNTER — Encounter (HOSPITAL_BASED_OUTPATIENT_CLINIC_OR_DEPARTMENT_OTHER): Payer: Self-pay | Admitting: Family

## 2023-05-07 ENCOUNTER — Ambulatory Visit (INDEPENDENT_AMBULATORY_CARE_PROVIDER_SITE_OTHER): Payer: 59 | Admitting: Family

## 2023-05-07 VITALS — BP 96/67 | HR 99 | Ht 65.0 in | Wt 171.0 lb

## 2023-05-07 DIAGNOSIS — I5032 Chronic diastolic (congestive) heart failure: Secondary | ICD-10-CM | POA: Diagnosis not present

## 2023-05-07 DIAGNOSIS — E785 Hyperlipidemia, unspecified: Secondary | ICD-10-CM | POA: Diagnosis not present

## 2023-05-07 DIAGNOSIS — I2510 Atherosclerotic heart disease of native coronary artery without angina pectoris: Secondary | ICD-10-CM | POA: Diagnosis not present

## 2023-05-07 DIAGNOSIS — R252 Cramp and spasm: Secondary | ICD-10-CM | POA: Diagnosis not present

## 2023-05-07 NOTE — Progress Notes (Unsigned)
Cardiology Office Note:  .   Date:  05/07/2023  ID:  [name], DOB April 16, 1966, MRN [id number] PCP: Garlan Fillers, MD  [facility] Providers Cardiologist:  Chilton Si, MD { Click to update primary MD,subspecialty MD or APP then REFRESH:1}   History of Present Illness: [name] is a 57 y.o. female ***  Presents today for follow up independently. Notes she is presently having a COPD flare. Finished course of antibiotics and prednisone recently.Likes to read [employer]   She does not feel her potassium is sufficient as she is having cramps in her calf, neck, hand.   Notes a twinge in her chest periodically which she attributes to her dyspnea.   J  ROS: Please see the history of present illness.    All other systems reviewed and are negative.   Studies Reviewed: .        Cardiac Studies & Procedures   CARDIAC CATHETERIZATION  CARDIAC CATHETERIZATION 05/12/2017  Narrative  A STENT SIERRA 4.00 X 15 MM drug eluting stent was successfully placed.  Ost LM lesion, 70 %stenosed.  Post intervention, there is a 0% residual stenosis.  Findings Coronary Findings Diagnostic  Dominance: Right  Left Main  Intervention  Ost LM lesion Angioplasty A STENT SIERRA 4.00 X 15 MM drug eluting stent was successfully placed. Stent strut is well apposed. Post-stent angioplasty was performed using a BALLOON [place] EMERGE MR 5.0X8. Maximum pressure: 15 atm. Inflation time: 10 sec. The pre-interventional distal flow is normal (TIMI 3).  The post-interventional distal flow is normal (TIMI 3). The intervention was successful . No complications occurred at this lesion. IVUS was performed on the lesion post PCI . There is a 0% residual stenosis post intervention.   CARDIAC CATHETERIZATION 05/06/2017  Narrative  Ost LM to LM lesion, 60 %stenosed.  Prox RCA lesion, 15 %stenosed.  LV end diastolic pressure is normal.  Left subclavian injection showed patent  LIMA. CVTS consult for significant LM disease.  Findings Coronary Findings Diagnostic  Dominance: Right  Left Main Vessel was injected. Culprit lesion. The lesion is type A, concentric and smooth. The lesion was not previously treated. The stenosis was measured by a visual reading. Pressure wire/FFR was not performed on the lesion. IVUS was not performed on the lesion.  Left Anterior Descending Vessel was injected. Vessel is normal in caliber. Vessel is angiographically normal.  Ramus Intermedius Vessel was injected. Vessel is large. Vessel is angiographically normal.  Left Circumflex Vessel was injected. Vessel is normal in caliber. Vessel is angiographically normal.  Right Coronary Artery Vessel was injected. Vessel is large. Not the culprit lesion. The lesion is type A and eccentric. The lesion was not previously treated. The stenosis was measured by a visual reading. Pressure wire/FFR was not performed on the lesion. IVUS was not performed on the lesion.  Intervention  No interventions have been documented.   STRESS TESTS  MYOCARDIAL PERFUSION IMAGING 08/28/2021  Narrative   The study is normal. The study is low risk.   No ST deviation was noted.   LV perfusion is normal. There is no evidence of ischemia. There is no evidence of infarction.   Left ventricular function is normal. Nuclear stress EF: 62 %. The left ventricular ejection fraction is normal (55-65%). End diastolic cavity size is normal.   Prior study not available for comparison.  Reduced apical counts with normal wall motion consistent with apical thinning artifact. Normal study without ischemia or infarction. Normal  LVEF, 62%. This is a low-risk study.   ECHOCARDIOGRAM  ECHOCARDIOGRAM COMPLETE 09/16/2018  Narrative *[name]* *[facility]* [address] [place], [place] [address] [phone]  ------------------------------------------------------------------- Transthoracic  Echocardiography  Patient:    Lori, [name] MR #:       [id number] Study Date: 09/16/2018 Gender:     F Age:        52 Height:     165.1 cm Weight:     75.3 kg BSA:        1.88 m^2 Pt. Status: Room:  Yolonda Kida [id number] ATTENDING    Marca Ancona, M.D. ORDERING     Marca Ancona, M.D. PERFORMING   [facility], Outpatient SONOGRAPHER  Celene Skeen, RDCS  cc:  ------------------------------------------------------------------- LV EF: 65% -   70%  ------------------------------------------------------------------- History:   PMH:  CHF I50.9.  Coronary artery disease.  Chronic obstructive pulmonary disease.  Risk factors:  Cardiomyopathy.  ------------------------------------------------------------------- Study Conclusions  - Left ventricle: The cavity size was normal. Wall thickness was normal. Systolic function was vigorous. The estimated ejection fraction was in the range of 65% to 70%. Doppler parameters are consistent with abnormal left ventricular relaxation (grade 1 diastolic dysfunction).  ------------------------------------------------------------------- Study data:  Comparison was made to the study of 08/15/2017.  Study status:  Routine.  Procedure:  The patient reported no pain pre or post test. Transthoracic echocardiography. Image quality was adequate.          Transthoracic echocardiography.  M-mode, complete 2D, spectral Doppler, and color Doppler.  Birthdate: Patient birthdate: [birth year].  Age:  Patient is 57 yr old.  Sex: Gender: female.    BMI: 27.6 kg/m^2.  Blood pressure:     95/72 Patient status:  Inpatient.  Study date:  Study date: 09/16/2018. Study time: 02:01 PM.  Location:  Echo laboratory.  -------------------------------------------------------------------  ------------------------------------------------------------------- Left ventricle:  The cavity size was normal. Wall thickness was normal. Systolic function was  vigorous. The estimated ejection fraction was in the range of 65% to 70%. Doppler parameters are consistent with abnormal left ventricular relaxation (grade 1 diastolic dysfunction).  ------------------------------------------------------------------- Aortic valve:  Poorly visualized.  Mildly thickened leaflets. Sclerosis without stenosis.  Doppler:  There was no regurgitation.  ------------------------------------------------------------------- Aorta:  Aortic root: The aortic root was normal in size. Ascending aorta: The ascending aorta was normal in size.  ------------------------------------------------------------------- Mitral valve:   Structurally normal valve.   Leaflet separation was normal.  Doppler:  Transvalvular velocity was within the normal range. There was no evidence for stenosis. There was trivial regurgitation.    Peak gradient (D): 3 mm Hg.  ------------------------------------------------------------------- Left atrium:  The atrium was normal in size.  ------------------------------------------------------------------- Right ventricle:  The cavity size was normal. Wall thickness was normal. Systolic function was normal.  ------------------------------------------------------------------- Tricuspid valve:   Structurally normal valve.   Leaflet separation was normal.  Doppler:  Transvalvular velocity was within the normal range. There was trivial regurgitation.  ------------------------------------------------------------------- Right atrium:  The atrium was normal in size.  ------------------------------------------------------------------- Pericardium:  There was no pericardial effusion.  ------------------------------------------------------------------- Systemic veins: Inferior vena cava: The vessel was normal in size. The respirophasic diameter changes were in the normal range (>= 50%), consistent with normal central venous  pressure.  ------------------------------------------------------------------- Measurements  Left ventricle                            Value  Reference LV ID, ED, PLAX chordal         (L)       33.9  mm     43 - 52 LV ID, ES, PLAX chordal         (L)       22.6  mm     23 - 38 LV fx shortening, PLAX chordal            33    %      >=29 LV PW thickness, ED                       10.5  mm     ---------- IVS/LV PW ratio, ED                       1.11         <=1.3 Stroke volume, 2D                         44    ml     ---------- Stroke volume/bsa, 2D                     23    ml/m^2 ----------  Ventricular septum                        Value        Reference IVS thickness, ED                         11.7  mm     ----------  LVOT                                      Value        Reference LVOT ID, S                                18    mm     ---------- LVOT area                                 2.54  cm^2   ---------- LVOT peak velocity, S                     112   cm/s   ---------- LVOT mean velocity, S                     75.4  cm/s   ---------- LVOT VTI, S                               17.2  cm     ---------- LVOT peak gradient, S                     5     mm Hg  ----------  Aorta                                     Value  Reference Aortic root ID, ED                        23    mm     ----------  Left atrium                               Value        Reference LA ID, A-P, ES                            35    mm     ---------- LA ID/bsa, A-P                            1.86  cm/m^2 <=2.2 LA volume, S                              27.7  ml     ---------- LA volume/bsa, S                          14.8  ml/m^2 ---------- LA volume, ES, 1-p A4C                    23.1  ml     ---------- LA volume/bsa, ES, 1-p A4C                12.3  ml/m^2 ---------- LA volume, ES, 1-p A2C                    27.5  ml     ---------- LA volume/bsa, ES, 1-p A2C                14.7  ml/m^2  ----------  Mitral valve                              Value        Reference Mitral E-wave peak velocity               85.9  cm/s   ---------- Mitral deceleration time                  201   ms     150 - 230 Mitral peak gradient, D                   3     mm Hg  ----------  Right atrium                              Value        Reference RA ID, S-I, ES, A4C                       47.2  mm     34 - 49 RA area, ES, A4C                          13.4  cm^2   8.3 - 19.5 RA volume, ES, A/L  30.3  ml     ---------- RA volume/bsa, ES, A/L                    16.1  ml/m^2 ----------  Right ventricle                           Value        Reference RV ID, minor axis, ED, A4C base           32.6  mm     ---------- RV ID, minor axis, ED, A4C mid            35    mm     ---------- RV ID, major axis, ED, A4C                69.3  mm     55 - 91 TAPSE                                     11.7  mm     ---------- RV s&', lateral, S                         9.79  cm/s   ----------  Legend: (L)  and  (H)  mark values outside specified reference range.  ------------------------------------------------------------------- Prepared and Electronically Authenticated by  Nicholes Mango, MD 2020-01-15T15:31:46             Risk Assessment/Calculations:     No BP recorded.  {Refresh Note OR Click here to enter BP  :1}***       Physical Exam:   VS:  There were no vitals taken for this visit.   Wt Readings from Last 3 Encounters:  04/01/23 170 lb 3.2 oz (77.2 kg)  12/09/22 167 lb (75.8 kg)  12/06/22 167 lb (75.8 kg)    GEN: Well nourished, well developed in no acute distress NECK: No JVD; No carotid bruits CARDIAC: ***RRR, no murmurs, rubs, gallops RESPIRATORY:  Clear to auscultation without rales, wheezing or rhonchi  ABDOMEN: Soft, non-tender, non-distended EXTREMITIES:  No edema; No deformity   ASSESSMENT AND PLAN: .    CAD-  Chronic diastolic heart failure-  HLD-    {Are  you ordering a CV Procedure (e.g. stress test, cath, DCCV, TEE, etc)?   Press F2        :[id number]}  Dispo: ***  Signed, Alver Sorrow, NP

## 2023-05-07 NOTE — Patient Instructions (Signed)
Medication Instructions:  Continue your current medications.  *If you need a refill on your cardiac medications before your next appointment, please call your pharmacy*   Lab Work: Your physician recommends that you return for lab work today: BMP, magnesium If you have labs (blood work) drawn today and your tests are completely normal, you will receive your results only by: MyChart Message (if you have MyChart) OR A paper copy in the mail If you have any lab test that is abnormal or we need to change your treatment, we will call you to review the results.  Follow-Up: At [facility], you and your health needs are our priority.  As part of our continuing mission to provide you with exceptional heart care, we have created designated Provider Care Teams.  These Care Teams include your primary Cardiologist (physician) and Advanced Practice Providers (APPs -  Physician Assistants and Nurse Practitioners) who all work together to provide you with the care you need, when you need it.  We recommend signing up for the patient portal called "MyChart".  Sign up information is provided on this After Visit Summary.  MyChart is used to connect with patients for Virtual Visits (Telemedicine).  Patients are able to view lab/test results, encounter notes, upcoming appointments, etc.  Non-urgent messages can be sent to your provider as well.   To learn more about what you can do with MyChart, go to [email or web address].    Your next appointment:   6 month(s)  Provider:   Chilton Si, MD or Gillian Shields, NP    Other Instructions  Heart Healthy Diet Recommendations: A low-salt diet is recommended. Meats should be grilled, baked, or boiled. Avoid fried foods. Focus on lean protein sources like fish or chicken with vegetables and fruits. The American Heart Association is a Chief Technology Officer!  American Heart Association Diet and Lifeystyle Recommendations   Exercise recommendations: The  American Heart Association recommends 150 minutes of moderate intensity exercise weekly. Try 30 minutes of moderate intensity exercise 4-5 times per week. This could include walking, jogging, or swimming.

## 2023-05-08 ENCOUNTER — Encounter (HOSPITAL_BASED_OUTPATIENT_CLINIC_OR_DEPARTMENT_OTHER): Payer: Self-pay | Admitting: Family

## 2023-05-08 LAB — BASIC METABOLIC PANEL
BUN/Creatinine Ratio: 21 (ref 9–23)
BUN: 19 mg/dL (ref 6–24)
CO2: 20 mmol/L (ref 20–29)
Calcium: 9.2 mg/dL (ref 8.7–10.2)
Chloride: 102 mmol/L (ref 96–106)
Creatinine, Ser: 0.91 mg/dL (ref 0.57–1.00)
Glucose: 79 mg/dL (ref 70–99)
Potassium: 4.2 mmol/L (ref 3.5–5.2)
Sodium: 140 mmol/L (ref 134–144)
eGFR: 73 mL/min/{1.73_m2} (ref >59)

## 2023-05-08 LAB — MAGNESIUM: Magnesium: 2 mg/dL (ref 1.6–2.3)

## 2023-06-08 NOTE — Progress Notes (Signed)
Patient ID: [name], female    DOB: October 21, 1965, 57 y.o.   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 Walk test on room air 06/28/2016-95%, 95%, 95%, 93%, peak heart rate 120/minute. No desaturation after 3185 feet. Office Spirometry 06/28/2016-limited validity due to cough. Restriction of exhaled volume. FVC 1.79/50%, FEV1 1.64/57%, ratio 0.80. FENO- 5 Walk Test on room Air- Qualified for home O2 04/10/17 PFT 06/17/17- severe obstruction, mild restriction, diffusion severely reduced. Insignificant response to bronchodilator. FVC 1.73/47%, FEV1 1.10/38%, ratio 0.64, TLC 77%, DLCO 32% CHF- acute hosp 05/2017- CHF, CAD, EF 20-25%. Stent. 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 Also Breztri andd  ventolin. Medications reviewed.  Easy dyspnea on exertion is stable but routine.  She says she has had 6 acute respiratory illnesses this winter.  By description this is mostly episodes of acute bronchitis addressed by her primary physician with prednisone and antibiotics. She reports chest x-ray by Dr. Silvano Rusk office without new concern.  She tested negative for flu and COVID. She is waking in the mornings with some frontal headache and is aware she snores loudly.  We discussed sleep study. She remains concerned about an apparent lipoma on her left back which she thinks has grown, remaining nontender.  The surgery office declined to see her because it was not commented on on previous chest CT.  We noted that that imaging was done with her lying on her back, flattening of the lipoma.  We discussed repeating the CT scan, taping a metal marker over the site, and bring it to the attention of the technologist. CT chest f/u pending 01/03/23- CT chest 07/08/22-  MPRESSION: 1. No correlate for the palpable abnormality about the posterolateral left chest wall. 2. Status post right upper lobectomy with presumed radiation induced consolidation within the posteromedial right upper lung. 3. Diffuse reticulonodular opacities are similar to 2019 and likely due to chronic infection, including atypical etiologies. Left apical pleural-based 5 mm nodule is not readily apparent on the prior, favored to represent progressive scarring. Consider chest CT follow-up at 6 months given multiple primaries. 4. Aortic atherosclerosis (ICD10-I70.0), coronary artery atherosclerosis and emphysema (ICD10-J43.9). 5. Aortic valvular calcifications. Consider echocardiography to evaluate for valvular dysfunction.  06/10/23- - 57 year old female former heavy smoker followed after right upper lobectomy for lung cancer/ XRT fibrosis, COPD, Chronic Hypoxic Respiratory Failure, allergic rhinitis, history diffuse interstitial process (?  Histiocytosis X) complicated by AdenoCa colon polyp, Melanoma right calf, Insomnia, dCHF/CAD/ Stent/CM-too high risk for CABG, Covid infection July 2022,  -O2 2-3 L/ and POC [employer] [employer] [facility] [employer]-sleep and when necessary  -  Neb Duoneb, [place], albuterol hfa, Ambien 5 LOV- Dr Celine Mans 7/30- AECOPD> prednisone, Zpak then doxycycline Persistent bronchitis.  She is using her oxygen at night.  Currently no chest pain but sputum is discolored. CT chest 01/06/23-  IMPRESSION: 1. Stable exam. No new or progressive findings to suggest recurrent or metastatic disease. 2. Stable 4 mm left upper lobe pulmonary nodule, likely benign. Continued attention on follow-up recommended. 3. The 5 mm left apical pleural base nodule seen on the previous study has resolved in the interval. 4. Stable appearance of postsurgical change and radiation fibrosis in the medial right lung. 5. Aortic Atherosclerosis (ICD10-I70.0) and Emphysema (ICD10-J43.9). Continued attention  Review of Systems-+ = positive Constitutional:   No-   weight loss, night sweats, fevers, chills, fatigue, lassitude. HEENT:   No-  headaches, difficulty swallowing, tooth/dental problems, sore throat,      mild sneezing,no- itching, ear ache, nasal congestion, post nasal drip,  CV:  No-   chest pain, orthopnea, PND, swelling in lower extremities, anasarca, dizziness, palpitations Resp: + shortness of breath with exertion or at rest.              +productive cough, + non-productive cough,  No- coughing up of blood.               change in color of mucus. + wheezing.   Skin: No-   rash or lesions. GI:  +HPI GU:   MS:  No-   joint pain or swelling.   Neuro-     nothing unusual Psych:  No- change in mood or affect. No depression or anxiety.  No memory loss.   Objective:   Physical Exam   on room air today-arrival sat 96% General- Alert, Oriented, Affect-appropriate, Distress - NAD,  Skin- rash-none, lesions- none, excoriation- none.   Lymphadenopathy- none Head- atraumatic            Eyes- Gross vision intact, PERRLA, conjunctivae clear secretions            Ears- Hearing, canals-normal            Nose- , no-Septal dev, mucus, polyps, erosion, perforation             Throat- Mallampati IV , mucosa clear , drainage- none, tonsils- atrophic,  Neck- flexible , trachea midline, no stridor , thyroid nl, carotid no bruit Chest - symmetrical excursion , unlabored           Heart/CV- RRR rapid , no murmur , no gallop  , no rub, nl s1 s2                           - JVD none , edema- none, stasis changes- none, varices- none           Lung-   Clear/diminished, Wheeze-none, cough+, dullness-none, rub- none. Rales- none           Chest wall- + soft mobile mass L lateral chest wall. Abd-  Br/ Gen/ Rectal- Not done, not indicated Extrem- cyanosis- none, clubbing, none, atrophy- none, strength- nl,    Neuro- grossly intact to observation

## 2023-06-10 ENCOUNTER — Encounter: Payer: Self-pay | Admitting: Internal Medicine

## 2023-06-10 ENCOUNTER — Ambulatory Visit: Payer: 59 | Admitting: Internal Medicine

## 2023-06-10 VITALS — BP 90/60 | HR 84 | Temp 97.4°F | Ht 65.0 in | Wt 169.8 lb

## 2023-06-10 DIAGNOSIS — Z23 Encounter for immunization: Secondary | ICD-10-CM

## 2023-06-10 DIAGNOSIS — F5101 Primary insomnia: Secondary | ICD-10-CM | POA: Diagnosis not present

## 2023-06-10 DIAGNOSIS — J9611 Chronic respiratory failure with hypoxia: Secondary | ICD-10-CM

## 2023-06-10 DIAGNOSIS — J4541 Moderate persistent asthma with (acute) exacerbation: Secondary | ICD-10-CM | POA: Diagnosis not present

## 2023-06-10 MED ORDER — ZOLPIDEM TARTRATE 5 MG PO TABS: ORAL_TABLET | ORAL | 5 refills | Status: DC

## 2023-06-10 MED ORDER — CEFDINIR 300 MG PO CAPS: 300.0000 mg | ORAL_CAPSULE | Freq: Two times a day (BID) | ORAL | 0 refills | Status: DC

## 2023-06-10 NOTE — Patient Instructions (Signed)
Ambien refilled  Script sent for [facility] antibiotic  Cancel order for Home sleep test  Order Flu  vax standard

## 2023-06-27 ENCOUNTER — Encounter: Payer: Self-pay | Admitting: Internal Medicine

## 2023-06-27 NOTE — Assessment & Plan Note (Signed)
Nonspecific exacerbation Plan-Omnicef, flu vaccine, stay comfortably hydrated.

## 2023-06-27 NOTE — Assessment & Plan Note (Signed)
Discussed sleep hygiene. Plan-refill Ambien with discussion

## 2023-06-27 NOTE — Assessment & Plan Note (Signed)
Continues to benefit from oxygen primarily with sleep Plan-continue O2 2 L for sleep

## 2023-07-22 ENCOUNTER — Other Ambulatory Visit: Payer: Self-pay | Admitting: Internal Medicine

## 2023-07-22 ENCOUNTER — Other Ambulatory Visit: Payer: Self-pay | Admitting: Cardiovascular Disease

## 2023-11-03 ENCOUNTER — Ambulatory Visit (HOSPITAL_BASED_OUTPATIENT_CLINIC_OR_DEPARTMENT_OTHER): Payer: 59 | Admitting: Family

## 2023-12-04 ENCOUNTER — Other Ambulatory Visit (HOSPITAL_BASED_OUTPATIENT_CLINIC_OR_DEPARTMENT_OTHER): Payer: Self-pay | Admitting: Cardiology

## 2023-12-04 DIAGNOSIS — I5042 Chronic combined systolic (congestive) and diastolic (congestive) heart failure: Secondary | ICD-10-CM

## 2023-12-04 DIAGNOSIS — E785 Hyperlipidemia, unspecified: Secondary | ICD-10-CM

## 2023-12-04 DIAGNOSIS — I2510 Atherosclerotic heart disease of native coronary artery without angina pectoris: Secondary | ICD-10-CM

## 2023-12-04 DIAGNOSIS — R0609 Other forms of dyspnea: Secondary | ICD-10-CM

## 2023-12-05 ENCOUNTER — Other Ambulatory Visit: Payer: Self-pay | Admitting: Internal Medicine

## 2023-12-07 NOTE — Progress Notes (Signed)
 Patient ID: [name], female    DOB: 07-Jun-1966, 58 y.o.   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 Walk test on room air 06/28/2016-95%, 95%, 95%, 93%, peak heart rate 120/minute. No desaturation after 3185 feet. Office Spirometry 06/28/2016-limited validity due to cough. Restriction of exhaled volume. FVC 1.79/50%, FEV1 1.64/57%, ratio 0.80. FENO- 5 Walk Test on room Air- Qualified for home O2 04/10/17 PFT 06/17/17- severe obstruction, mild restriction, diffusion severely reduced. Insignificant response to bronchodilator. FVC 1.73/47%, FEV1 1.10/38%, ratio 0.64, TLC 77%, DLCO 32% CHF- acute hosp 05/2017- CHF, CAD, EF 20-25%. Stent. Added Plavix , spironolactone , Coreg , Cozaar  Walk test 02/10/2019- desat to 88%, on 2L o2 was 94% Echocardiogram 09/16/18- DD, EF 65-70% Walk Test O2 Qualifying 12/09/23- Room Air rest 96%, ambulation dropped to 88% on room air, ambulating 92% on 3L pulse. 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  She is using her oxygen at night.  Currently no chest pain but sputum is discolored. CT chest 01/06/23-  IMPRESSION: 1. Stable exam. No new or progressive findings to suggest recurrent or metastatic disease. 2. Stable 4 mm left upper lobe pulmonary nodule, likely benign. Continued attention on follow-up recommended. 3. The 5 mm left apical pleural base nodule seen on the previous study has resolved in the interval. 4. Stable appearance of postsurgical change and radiation fibrosis in the medial right lung. 5. Aortic Atherosclerosis (ICD10-I70.0) and Emphysema (ICD10-J43.9). 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  Needs qualifying walk test for oxygen per insurance. She wasn't able to afford sleep study ordered last year. Walk Test O2 Qualifying 12/09/23- Room Air rest 96%, ambulation dropped to 88% on room air, ambulating 92% on 3L pulse. Pending ONOX on room air to requalify for home concentrator. Discussed the use of AI scribe software for clinical note transcription with the patient, who gave verbal consent to proceed.  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 She uses nighttime oxygen  as prescribed, but is confused about the need for an overnight oximetry test, which she initially thought was a sleep apnea test. She expresses frustration about the  cost of the test and the weight of her portable oxygen concentrator.  The patient reports a recent episode of respiratory illness, which she describes as a "COPD flare." During this episode, she experienced severe coughing and difficulty expectorating, and her oxygen saturation dropped to 88%. She was treated with a 10-day course of antibiotics, which improved her symptoms.  The patient also mentions a history of lung nodules, which were previously evaluated with CT scans and chest x-rays. She expresses concern about a recent denial of coverage for a CT scan by her insurance. This was ordered by her PCP. I think she is referring to a low dose screening chest CT. Most recent CXR at her last PCP office visit with no changes reported to her.  In addition to her respiratory issues, the patient mentions the use of a nebulizer machine during her COPD flare-ups. She also uses a flutter valve to help clear her lungs. She expresses frustration about the frequency of her flare-ups last year and the impact on her quality of life.    Assessment and Plan:    Chronic Obstructive Pulmonary Disease (COPD) Frequent exacerbations with recent severe episode. Requires ongoing management with Breztri , nebulizer, and flutter valve. Uses portable oxygen concentrator during activities. -Consider Ohtuvayre , but too expensive without assistance - Continue Breztri  daily. - Use nebulizer as needed during exacerbations. - Perform overnight oximetry without supplemental oxygen to assess qualification for nocturnal oxygen therapy. Walk test done today. - Continue using flutter valve to aid in secretion clearance. - Continue light portable oxygen concentrator options.  Chronic Hypoxic Respiratory Failure -Walk test qualifies for POC 3L pulse for exertion. -Uses  nocturnal oxygen therapy. Overnight oximetry planned to assess qualification for continued therapy.  Pulmonary Fibrosis- Radiation fibrosis Stable with no recent imaging to suggest progression. Past CT scans showed inflammation and fibrosis. - Coordinate with primary care for imaging needs.  Allergic Rhinitis Significant symptoms due to pollen exposure, exacerbated by environmental factors. - Provide anticipatory guidance on pollen exposure and management.      Review of Systems-+ = positive Constitutional:   No-   weight loss, night sweats, fevers, chills, fatigue, lassitude. HEENT:   No-  headaches, difficulty swallowing, tooth/dental problems, sore throat,      mild sneezing,no- itching, ear ache, nasal congestion, post nasal drip,  CV:  No-   chest pain, orthopnea, PND, swelling in lower extremities, anasarca, dizziness, palpitations Resp: + shortness of breath with exertion or at rest.              +productive cough, + non-productive cough,  No- coughing up of blood.               change in color of mucus. + wheezing.   Skin: No-   rash or lesions. GI:  +HPI GU:   MS:  No-   joint pain or swelling.   Neuro-     nothing unusual Psych:  No- change in mood or affect. No depression or anxiety.  No memory loss.   Objective:   Physical Exam    General- Alert, Oriented, Affect-appropriate, Distress - NAD,  Skin- rash-none, lesions- none, excoriation- none.  Lymphadenopathy- none Head- atraumatic            Eyes- Gross vision intact, PERRLA, conjunctivae clear secretions            Ears- Hearing, canals-normal            Nose- , no-Septal dev, mucus, polyps, erosion, perforation  Throat- Mallampati IV , mucosa clear , drainage- none, tonsils- atrophic,  Neck- flexible , trachea midline, no stridor , thyroid  nl, carotid no bruit Chest - symmetrical excursion , unlabored           Heart/CV- RRR rapid , no murmur , no gallop  , no rub, nl s1 s2                           -  JVD none , edema- none, stasis changes- none, varices- none           Lung-   +diminished, +Wheeze-slight, cough+, dullness-none, rub- none. Rales- none           Chest wall- + soft mobile mass L lateral chest wall, c/w lipoma. Abd-  Br/ Gen/ Rectal- Not done, not indicated Extrem- cyanosis- none, clubbing, none, atrophy- none, strength- nl,    Neuro- grossly intact to observation

## 2023-12-08 ENCOUNTER — Telehealth: Payer: Self-pay | Admitting: Cardiovascular Disease

## 2023-12-08 DIAGNOSIS — E785 Hyperlipidemia, unspecified: Secondary | ICD-10-CM

## 2023-12-08 DIAGNOSIS — I5042 Chronic combined systolic (congestive) and diastolic (congestive) heart failure: Secondary | ICD-10-CM

## 2023-12-08 DIAGNOSIS — I2510 Atherosclerotic heart disease of native coronary artery without angina pectoris: Secondary | ICD-10-CM

## 2023-12-08 DIAGNOSIS — R0609 Other forms of dyspnea: Secondary | ICD-10-CM

## 2023-12-08 NOTE — Telephone Encounter (Signed)
*  STAT* If patient is at the pharmacy, call can be transferred to refill team.   1. Which medications need to be refilled? (please list name of each medication and dose if known) atorvastatin (LIPITOR) 40 MG tablet    carvedilol (COREG) 6.25 MG tablet    ezetimibe (ZETIA) 10 MG tablet    furosemide (LASIX) 20 MG tablet    2. Which pharmacy/location (including street and city if local pharmacy) is medication to be sent to?  [id number] - [place], [place] - [address]    3. Do they need a 30 day or 90 day supply? 90

## 2023-12-09 ENCOUNTER — Encounter: Payer: Self-pay | Admitting: Internal Medicine

## 2023-12-09 ENCOUNTER — Ambulatory Visit: Payer: 59 | Admitting: Internal Medicine

## 2023-12-09 ENCOUNTER — Other Ambulatory Visit: Payer: Self-pay

## 2023-12-09 ENCOUNTER — Other Ambulatory Visit (HOSPITAL_COMMUNITY): Payer: Self-pay

## 2023-12-09 VITALS — BP 110/62 | HR 98 | Temp 97.6°F | Ht 65.0 in | Wt 175.2 lb

## 2023-12-09 DIAGNOSIS — Z87891 Personal history of nicotine dependence: Secondary | ICD-10-CM | POA: Diagnosis not present

## 2023-12-09 DIAGNOSIS — E785 Hyperlipidemia, unspecified: Secondary | ICD-10-CM

## 2023-12-09 DIAGNOSIS — J449 Chronic obstructive pulmonary disease, unspecified: Secondary | ICD-10-CM | POA: Diagnosis not present

## 2023-12-09 DIAGNOSIS — I2510 Atherosclerotic heart disease of native coronary artery without angina pectoris: Secondary | ICD-10-CM

## 2023-12-09 DIAGNOSIS — J9611 Chronic respiratory failure with hypoxia: Secondary | ICD-10-CM | POA: Diagnosis not present

## 2023-12-09 DIAGNOSIS — Z923 Personal history of irradiation: Secondary | ICD-10-CM

## 2023-12-09 DIAGNOSIS — Z902 Acquired absence of lung [part of]: Secondary | ICD-10-CM

## 2023-12-09 DIAGNOSIS — I5042 Chronic combined systolic (congestive) and diastolic (congestive) heart failure: Secondary | ICD-10-CM

## 2023-12-09 DIAGNOSIS — R0609 Other forms of dyspnea: Secondary | ICD-10-CM

## 2023-12-09 MED ORDER — CARVEDILOL 6.25 MG PO TABS: 6.2500 mg | ORAL_TABLET | Freq: Two times a day (BID) | ORAL | 1 refills | Status: DC

## 2023-12-09 MED ORDER — EZETIMIBE 10 MG PO TABS
10.0000 mg | ORAL_TABLET | Freq: Every day | ORAL | 1 refills | Status: DC
  Filled 2023-12-09: qty 90, 90d supply, fill #0

## 2023-12-09 MED ORDER — FUROSEMIDE 20 MG PO TABS
20.0000 mg | ORAL_TABLET | Freq: Every day | ORAL | 1 refills | Status: DC
  Filled 2023-12-09: qty 90, 90d supply, fill #0

## 2023-12-09 MED ORDER — ATORVASTATIN CALCIUM 40 MG PO TABS: 40.0000 mg | ORAL_TABLET | Freq: Every day | ORAL | 1 refills | Status: DC

## 2023-12-09 MED ORDER — EZETIMIBE 10 MG PO TABS: 10.0000 mg | ORAL_TABLET | Freq: Every day | ORAL | 1 refills | Status: DC

## 2023-12-09 MED ORDER — ATORVASTATIN CALCIUM 40 MG PO TABS
40.0000 mg | ORAL_TABLET | Freq: Every day | ORAL | 1 refills | Status: DC
  Filled 2023-12-09: qty 90, 90d supply, fill #0

## 2023-12-09 MED ORDER — FUROSEMIDE 20 MG PO TABS: 20.0000 mg | ORAL_TABLET | Freq: Every day | ORAL | 1 refills | Status: DC

## 2023-12-09 MED ORDER — CARVEDILOL 6.25 MG PO TABS
6.2500 mg | ORAL_TABLET | Freq: Two times a day (BID) | ORAL | 1 refills | Status: DC
  Filled 2023-12-09: qty 180, 90d supply, fill #0

## 2023-12-09 NOTE — Patient Instructions (Signed)
 Order - order- POC O2 qualifying walk test on room air       dx Chronic respiratory failure with hypoxia  Order-  overnight oximetry on room air   dx chronic respiratory failure with hypoxia

## 2023-12-23 ENCOUNTER — Other Ambulatory Visit (HOSPITAL_BASED_OUTPATIENT_CLINIC_OR_DEPARTMENT_OTHER): Payer: Self-pay | Admitting: Cardiology

## 2023-12-23 DIAGNOSIS — I2510 Atherosclerotic heart disease of native coronary artery without angina pectoris: Secondary | ICD-10-CM

## 2023-12-26 ENCOUNTER — Telehealth: Payer: Self-pay | Admitting: Cardiovascular Disease

## 2023-12-26 DIAGNOSIS — I2510 Atherosclerotic heart disease of native coronary artery without angina pectoris: Secondary | ICD-10-CM

## 2023-12-26 MED ORDER — NITROGLYCERIN 0.4 MG SL SUBL: 0.4000 mg | SUBLINGUAL_TABLET | SUBLINGUAL | 3 refills | Status: DC | PRN

## 2023-12-26 NOTE — Telephone Encounter (Signed)
 Rx refill sent to pharmacy.

## 2024-01-02 ENCOUNTER — Other Ambulatory Visit: Payer: Self-pay | Admitting: Internal Medicine

## 2024-01-02 NOTE — Telephone Encounter (Signed)
**Note De-identified  [place] Obfuscation** Please advise 

## 2024-01-02 NOTE — Telephone Encounter (Signed)
 Ambien refilled

## 2024-01-13 ENCOUNTER — Ambulatory Visit (HOSPITAL_BASED_OUTPATIENT_CLINIC_OR_DEPARTMENT_OTHER): Payer: 59 | Admitting: Family

## 2024-01-13 ENCOUNTER — Encounter (HOSPITAL_BASED_OUTPATIENT_CLINIC_OR_DEPARTMENT_OTHER): Payer: Self-pay | Admitting: Family

## 2024-01-13 VITALS — BP 100/60 | HR 108 | Ht 65.0 in | Wt 179.7 lb

## 2024-01-13 DIAGNOSIS — I2510 Atherosclerotic heart disease of native coronary artery without angina pectoris: Secondary | ICD-10-CM

## 2024-01-13 DIAGNOSIS — I5042 Chronic combined systolic (congestive) and diastolic (congestive) heart failure: Secondary | ICD-10-CM

## 2024-01-13 DIAGNOSIS — R0609 Other forms of dyspnea: Secondary | ICD-10-CM | POA: Diagnosis not present

## 2024-01-13 DIAGNOSIS — J441 Chronic obstructive pulmonary disease with (acute) exacerbation: Secondary | ICD-10-CM

## 2024-01-13 MED ORDER — BENZONATATE 100 MG PO CAPS
100.0000 mg | ORAL_CAPSULE | Freq: Three times a day (TID) | ORAL | 0 refills | Status: DC | PRN
Start: 2024-01-13 — End: 2024-06-16

## 2024-01-13 MED ORDER — PREDNISONE 10 MG PO TABS: ORAL_TABLET | ORAL | 0 refills | Status: DC

## 2024-01-13 MED ORDER — DOXYCYCLINE HYCLATE 100 MG PO TABS: 100.0000 mg | ORAL_TABLET | Freq: Two times a day (BID) | ORAL | 0 refills | Status: DC

## 2024-01-13 NOTE — Patient Instructions (Addendum)
 Medication Instructions:   START Doxycyline one tablet twice daily for 14 days for treatment of COPD flare  START Prednisone   Take 4 tablets (40 mg total) by mouth daily with breakfast for 2 days, THEN 3 tablets (30 mg total) daily with breakfast for 2 days THEN 2 tablets (20 mg total) daily with breakfast for 2 days THEN 1 tablet (10 mg total) daily with breakfast for 2 days.  START Tessalone 100mg  (one tablet) as needed at bedtime for cough If one tablet is not enough to stop your cough, okay to take a second tablet  If your COPD flare does not improve with antibiotics and prednisone , please call pulmonology to discuss.   *If you need a refill on your cardiac medications before your next appointment, please call your pharmacy*  Lab Work: Your physician recommends that you return for lab work today: CBC, BMET, BNP  If you have labs (blood work) drawn today and your tests are completely normal, you will receive your results only by: MyChart Message (if you have MyChart) OR A paper copy in the mail If you have any lab test that is abnormal or we need to change your treatment, we will call you to review the results.  Testing/Procedures: Your EKG today looked good!  Follow-Up: At [facility], you and your health needs are our priority.  As part of our continuing mission to provide you with exceptional heart care, our providers are all part of one team.  This team includes your primary Cardiologist (physician) and Advanced Practice Providers or APPs (Physician Assistants and Nurse Practitioners) who all work together to provide you with the care you need, when you need it.  Your next appointment:   6 month(s)  Provider:   Maudine Sos, MD, Slater Duncan, NP, or Neomi Banks, NP    We recommend signing up for the patient portal called "MyChart".  Sign up information is provided on this After Visit Summary.  MyChart is used to connect with patients for Virtual Visits  (Telemedicine).  Patients are able to view lab/test results, encounter notes, upcoming appointments, etc.  Non-urgent messages can be sent to your provider as well.   To learn more about what you can do with MyChart, go to [email or web address].   Other Instructions  Heart Healthy Diet Recommendations: A low-salt diet is recommended. Meats should be grilled, baked, or boiled. Avoid fried foods. Focus on lean protein sources like fish or chicken with vegetables and fruits. The American Heart Association is a Chief Technology Officer!  American Heart Association Diet and Lifeystyle Recommendations   Exercise recommendations: The American Heart Association recommends 150 minutes of moderate intensity exercise weekly. Try 30 minutes of moderate intensity exercise 4-5 times per week. This could include walking, jogging, or swimming.

## 2024-01-13 NOTE — Progress Notes (Signed)
 Cardiology Office Note:  .   Date:  01/13/2024  ID:  [name], DOB 01-06-1966, MRN [id number] PCP: Bertha Broad, MD  [facility] Providers Cardiologist:  Maudine Sos, MD    History of Present Illness: .   [name] is a 58 y.o. female with history of combined HF with recovered LVEF (25% ? 65%). CAD s/p DES-ostial LAD, LM, severe COPD, lung cancer in remission (s/p lobectomy, XRT, chemo), prior colon cancer s/p resection, melanoma.   Seen 08/09/21 with exertional dyspnea with subsequent myoview  low risk.  Losartan  previously reduced to 12.5 mg daily and had lightheadedness, dizziness on higher dose.  Last seen 05/07/2023 doing overall well from cardiac perspective and recommended follow-up in 6 months.  Saw Dr. Linder Revere of pulmonary 12/09/23 recommended to continue Breztri  daily, continue nebulizer PRN, overnight oximetry to assess for need for nocturnal oxyten therapy. She qualified for 3L O2 with exertion.   Presents today for follow up independently. Started having a COPD flare 2 weeks ago symptomatic with wheezing, increased dyspnea. She has been using her Breztri  daily, Albuterol  inhaler "a lot", and nebulizer 4 times per week. Concerned about cost of going to multiple doctors visits. Wearing oxygen at night on 1-2L and using her portable as-needed during the day - did not bring to clinic visit today. She is taking Muccinex which she reports is helping some with her congestion. Has not checked oxygen levelat home. Feels when she rolls over in bed she is short of breath. She notes palpitations with exertion which resolved as she is sitting. It is associated with her dyspnea.   ROS: Please see the history of present illness.    All other systems reviewed and are negative.   Studies Reviewed: [name]   EKG Interpretation Date/Time:  Tuesday Jan 13 2024 15:24:44 EDT Ventricular Rate:  108 PR Interval:  186 QRS Duration:  76 QT Interval:  348 QTC Calculation: 466 R  Axis:   20  Text Interpretation: Sinus tachycardia Confirmed by [name] ([id number]) on 01/13/2024 3:30:21 PM       Risk Assessment/Calculations:          Physical Exam:   VS:  BP 100/60   Pulse (!) 108   Ht 5\' 5"  (1.651 m)   Wt 179 lb 11.2 oz (81.5 kg)   BMI 29.90 kg/m    Wt Readings from Last 3 Encounters:  01/13/24 179 lb 11.2 oz (81.5 kg)  12/09/23 175 lb 3.2 oz (79.5 kg)  06/10/23 169 lb 12.8 oz (77 kg)    GEN: Well nourished, well developed in no acute distress NECK: No JVD; No carotid bruits CARDIAC: RRR, no murmurs, rubs, gallops RESPIRATORY:  diffuse expiratory wheezing throughout all lung fields without rales, or rhonchi  ABDOMEN: Soft, non-tender, non-distended EXTREMITIES:  No edema; No deformity   ASSESSMENT AND PLAN: .    COPD flare - ongoing x 2 weeks symptomatic with wheeze, congested cough with green phlegm. Diffuse expiratory wheezing throughout lung fields.  Discussed with Dr. Linder Revere of pulmonology via secure chat.  Will Rx doxycycline  100 [place] twice daily x 14 days.  Will Rx prednisone  taper 40 mg daily X 2 days, Rams daily X 2 days, 20 mg daily X 2 days, 10 mg daily X 2 days.  Rx Tessalon 100 mg to be used at night for cough.  May use OTC guaifenesin .  If symptoms do not improve she will contact pulmonology for sooner follow-up.  CAD / HLD, LDL goal <  70 - Stable with no anginal symptoms. No indication for ischemic evaluation.  GDMT atorvastatin  40 mg daily, plavix  75 mg daily, zetia  10 mg daily, carvedilol  6.25 mg twice daily. Heart healthy diet and regular cardiovascular exercise encouraged.    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 GDMT coreg  6.25mg  BID, Lasix  20mg  daily, spironolactone  25mg  daily, Losartan  12.5mg  daily.  Tolerating relative hypotension without lightheadedness, dizziness.     Dispo: follow up in 6 months  Signed, Clearnce Curia, NP

## 2024-01-14 LAB — CBC

## 2024-01-14 LAB — BASIC METABOLIC PANEL WITH GFR
BUN/Creatinine Ratio: 10 (ref 9–23)
BUN: 9 mg/dL (ref 6–24)
CO2: 17 mmol/L — ABNORMAL LOW (ref 20–29)
Calcium: 9.5 mg/dL (ref 8.7–10.2)
Chloride: 103 mmol/L (ref 96–106)
Creatinine, Ser: 0.89 mg/dL (ref 0.57–1.00)
Glucose: 89 mg/dL (ref 70–99)
Potassium: 4.1 mmol/L (ref 3.5–5.2)
Sodium: 142 mmol/L (ref 134–144)
eGFR: 75 mL/min/{1.73_m2} (ref >59)

## 2024-01-14 LAB — BRAIN NATRIURETIC PEPTIDE: BNP: 87.2 pg/mL (ref 0.0–100.0)

## 2024-01-15 ENCOUNTER — Ambulatory Visit (HOSPITAL_BASED_OUTPATIENT_CLINIC_OR_DEPARTMENT_OTHER): Payer: Self-pay | Admitting: Family

## 2024-01-15 DIAGNOSIS — I2510 Atherosclerotic heart disease of native coronary artery without angina pectoris: Secondary | ICD-10-CM

## 2024-01-15 DIAGNOSIS — I5042 Chronic combined systolic (congestive) and diastolic (congestive) heart failure: Secondary | ICD-10-CM

## 2024-01-15 DIAGNOSIS — E785 Hyperlipidemia, unspecified: Secondary | ICD-10-CM

## 2024-01-15 DIAGNOSIS — R0609 Other forms of dyspnea: Secondary | ICD-10-CM

## 2024-01-15 MED ORDER — SPIRONOLACTONE 25 MG PO TABS: 25.0000 mg | ORAL_TABLET | Freq: Every day | ORAL | 3 refills | Status: AC

## 2024-01-15 MED ORDER — EZETIMIBE 10 MG PO TABS: 10.0000 mg | ORAL_TABLET | Freq: Every day | ORAL | 3 refills | Status: AC

## 2024-01-15 MED ORDER — CARVEDILOL 6.25 MG PO TABS: 6.2500 mg | ORAL_TABLET | Freq: Two times a day (BID) | ORAL | 3 refills | Status: AC

## 2024-01-15 MED ORDER — POTASSIUM CHLORIDE ER 10 MEQ PO TBCR: 10.0000 meq | EXTENDED_RELEASE_TABLET | Freq: Every day | ORAL | 3 refills | Status: AC

## 2024-01-15 MED ORDER — ATORVASTATIN CALCIUM 40 MG PO TABS: 40.0000 mg | ORAL_TABLET | Freq: Every day | ORAL | 3 refills | Status: AC

## 2024-01-15 MED ORDER — CLOPIDOGREL BISULFATE 75 MG PO TABS: 75.0000 mg | ORAL_TABLET | Freq: Every day | ORAL | 3 refills | Status: AC

## 2024-01-15 MED ORDER — FUROSEMIDE 20 MG PO TABS: 20.0000 mg | ORAL_TABLET | Freq: Every day | ORAL | 3 refills | Status: AC

## 2024-01-15 MED ORDER — NITROGLYCERIN 0.4 MG SL SUBL: 0.4000 mg | SUBLINGUAL_TABLET | SUBLINGUAL | 4 refills | Status: DC | PRN

## 2024-01-15 MED ORDER — LOSARTAN POTASSIUM 25 MG PO TABS: 25.0000 mg | ORAL_TABLET | Freq: Every day | ORAL | 3 refills | Status: AC

## 2024-01-15 NOTE — Telephone Encounter (Signed)
 Pt will have CBC redrawn after she feels better- okay to send refills?

## 2024-01-15 NOTE — Addendum Note (Signed)
 Addended by: Guss Legacy on: 01/15/2024 09:06 AM   Modules accepted: Orders

## 2024-01-15 NOTE — Telephone Encounter (Signed)
 Rx sent.

## 2024-01-15 NOTE — Telephone Encounter (Signed)
 Okay to send refills of cardiac medications. 90 days with 3 refills. TY!  Alphonsine Minium S Cachet Mccutchen, NP

## 2024-01-19 ENCOUNTER — Ambulatory Visit (INDEPENDENT_AMBULATORY_CARE_PROVIDER_SITE_OTHER): Admitting: Dermatology

## 2024-01-19 ENCOUNTER — Encounter: Payer: Self-pay | Admitting: Dermatology

## 2024-01-19 VITALS — BP 104/70 | HR 92

## 2024-01-19 DIAGNOSIS — Z1283 Encounter for screening for malignant neoplasm of skin: Secondary | ICD-10-CM | POA: Diagnosis not present

## 2024-01-19 DIAGNOSIS — D492 Neoplasm of unspecified behavior of bone, soft tissue, and skin: Secondary | ICD-10-CM

## 2024-01-19 DIAGNOSIS — W908XXA Exposure to other nonionizing radiation, initial encounter: Secondary | ICD-10-CM

## 2024-01-19 DIAGNOSIS — D229 Melanocytic nevi, unspecified: Secondary | ICD-10-CM

## 2024-01-19 DIAGNOSIS — Z85820 Personal history of malignant melanoma of skin: Secondary | ICD-10-CM

## 2024-01-19 DIAGNOSIS — L209 Atopic dermatitis, unspecified: Secondary | ICD-10-CM

## 2024-01-19 DIAGNOSIS — D485 Neoplasm of uncertain behavior of skin: Secondary | ICD-10-CM

## 2024-01-19 DIAGNOSIS — L929 Granulomatous disorder of the skin and subcutaneous tissue, unspecified: Secondary | ICD-10-CM

## 2024-01-19 DIAGNOSIS — L578 Other skin changes due to chronic exposure to nonionizing radiation: Secondary | ICD-10-CM | POA: Diagnosis not present

## 2024-01-19 DIAGNOSIS — L821 Other seborrheic keratosis: Secondary | ICD-10-CM

## 2024-01-19 DIAGNOSIS — D1801 Hemangioma of skin and subcutaneous tissue: Secondary | ICD-10-CM

## 2024-01-19 DIAGNOSIS — L814 Other melanin hyperpigmentation: Secondary | ICD-10-CM

## 2024-01-19 MED ORDER — CLOBETASOL PROPIONATE 0.05 % EX OINT: 1.0000 | TOPICAL_OINTMENT | Freq: Two times a day (BID) | CUTANEOUS | 0 refills | Status: DC

## 2024-01-19 NOTE — Patient Instructions (Addendum)
 Patient Handout: Wound Care for Skin Biopsy Site  Taking Care of Your Skin Biopsy Site  Proper care of the biopsy site is essential for promoting healing and minimizing scarring. This handout provides instructions on how to care for your biopsy site to ensure optimal recovery.  1. Cleaning the Wound:  Clean the biopsy site daily with gentle soap and water. Gently pat the area dry with a clean, soft towel. Avoid harsh scrubbing or rubbing the area, as this can irritate the skin and delay healing.  2. Applying Aquaphor and Bandage:  After cleaning the wound, apply a thin layer of Aquaphor ointment to the biopsy site. Cover the area with a sterile bandage to protect it from dirt, bacteria, and friction. Change the bandage daily or as needed if it becomes soiled or wet.  3. Continued Care for One Week:  Repeat the cleaning, Aquaphor application, and bandaging process daily for one week following the biopsy procedure. Keeping the wound clean and moist during this initial healing period will help prevent infection and promote optimal healing.  4. Massaging Aquaphor into the Area:  ---After one week, discontinue the use of bandages but continue to apply Aquaphor to the biopsy site. ----Gently massage the Aquaphor into the area using circular motions. ---Massaging the skin helps to promote circulation and prevent the formation of scar tissue.   Additional Tips:  Avoid exposing the biopsy site to direct sunlight during the healing process, as this can cause hyperpigmentation or worsen scarring. If you experience any signs of infection, such as increased redness, swelling, warmth, or drainage from the wound, contact your healthcare provider immediately. Follow any additional instructions provided by your healthcare provider for caring for the biopsy site and managing any discomfort. Conclusion:  Taking proper care of your skin biopsy site is crucial for ensuring optimal healing and  minimizing scarring. By following these instructions for cleaning, applying Aquaphor, and massaging the area, you can promote a smooth and successful recovery. If you have any questions or concerns about caring for your biopsy site, don't hesitate to contact your healthcare provider for guidance.    Skin Education : We  counseled the patient regarding the following: Sun screen (SPF 30 or greater) should be applied during peak UV exposure (between 10am and 2pm) and reapplied after exercise or swimming.  The ABCDEs of melanoma were reviewed with the patient, and the importance of monthly self-examination of moles was emphasized. Should any moles change in shape or color, or itch, bleed or burn, pt will contact our office for evaluation sooner then their interval appointment.  Plan: Sunscreen Recommendations We recommended a broad spectrum sunscreen with a SPF of 30 or higher.  SPF 30 sunscreens block approximately 97 percent of the sun's harmful rays. Sunscreens should be applied at least 15 minutes prior to expected sun exposure and then every 2 hours after that as long as sun exposure continues. If swimming or exercising sunscreen should be reapplied every 45 minutes to an hour after getting wet or sweating. One ounce, or the equivalent of a shot glass full of sunscreen, is adequate to protect the skin not covered by a bathing suit. We also recommended a lip balm with a sunscreen as well. Sun protective clothing can be used in lieu of sunscreen but must be worn the entire time you are exposed to the sun's rays. Important Information   Due to recent changes in healthcare laws, you may see results of your pathology and/or laboratory studies on MyChart before  the doctors have had a chance to review them. We understand that in some cases there may be results that are confusing or concerning to you. Please understand that not all results are received at the same time and often the doctors may need to interpret  multiple results in order to provide you with the best plan of care or course of treatment. Therefore, we ask that you please give [place] 2 business days to thoroughly review all your results before contacting the office for clarification. Should we see a critical lab result, you will be contacted sooner.     If You Need Anything After Your Visit   If you have any questions or concerns for your doctor, please call our main line at [phone]. If no one answers, please leave a voicemail as directed and we will return your call as soon as possible. Messages left after 4 pm will be answered the following business day.    You may also send [place] a message via MyChart. We typically respond to MyChart messages within 1-2 business days.  For prescription refills, please ask your pharmacy to contact our office. Our fax number is [phone].  If you have an urgent issue when the clinic is closed that cannot wait until the next business day, you can page your doctor at the number below.     Please note that while we do our best to be available for urgent issues outside of office hours, we are not available 24/7.    If you have an urgent issue and are unable to reach [place], you may choose to seek medical care at your doctor's office, retail clinic, urgent care center, or emergency room.   If you have a medical emergency, please immediately call 911 or go to the emergency department. In the event of inclement weather, please call our main line at [phone] for an update on the status of any delays or closures.  Dermatology Medication Tips: Please keep the boxes that topical medications come in in order to help keep track of the instructions about where and how to use these. Pharmacies typically print the medication instructions only on the boxes and not directly on the medication tubes.   If your medication is too expensive, please contact our office at [phone] or send [place] a message through MyChart.     We are unable to tell what your co-pay for medications will be in advance as this is different depending on your insurance coverage. However, we may be able to find a substitute medication at lower cost or fill out paperwork to get insurance to cover a needed medication.    If a prior authorization is required to get your medication covered by your insurance company, please allow [place] 1-2 business days to complete this process.   Drug prices often vary depending on where the prescription is filled and some pharmacies may offer cheaper prices.   The website www.goodrx.com contains coupons for medications through different pharmacies. The prices here do not account for what the cost may be with help from insurance (it may be cheaper with your insurance), but the website can give you the price if you did not use any insurance.  - You can print the associated coupon and take it with your prescription to the pharmacy.  - You may also stop by our office during regular business hours and pick up a GoodRx coupon card.  - If you need your prescription sent electronically to a different pharmacy, notify  our office through [facility] or by phone at [phone]

## 2024-01-19 NOTE — Progress Notes (Signed)
 New Patient Visit   Subjective  [name] is a 58 y.o. female who presents for the following: Skin Cancer Screening and Full Body Skin Exam  The patient presents for Total-Body Skin Exam (TBSE) for skin cancer screening and mole check. The patient has spots, moles and lesions to be evaluated, some may be new or changing.  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  The following portions of the chart were reviewed this encounter and updated as appropriate: medications, allergies, medical history  Review of Systems:  No other skin or systemic complaints except as noted in HPI or Assessment and Plan.  Objective  Well appearing patient in no apparent distress; mood and affect are within normal limits.  A full examination was performed including scalp, head, eyes, ears, nose, lips, neck, chest, axillae, abdomen, back, buttocks, bilateral upper extremities, bilateral lower extremities, hands, feet, fingers, toes, fingernails, and toenails. All findings within normal limits unless otherwise noted below.   Relevant physical exam findings are noted in the Assessment and Plan.  Left Malar Cheek 6 mm Pink crusted papule   Assessment & Plan   SKIN CANCER SCREENING PERFORMED TODAY.  ACTINIC DAMAGE - Chronic condition, secondary to cumulative UV/sun exposure - diffuse scaly erythematous macules with underlying dyspigmentation - Recommend daily broad spectrum sunscreen SPF 30+ to sun-exposed areas, reapply every 2 hours as needed.  - Staying in the shade or wearing long sleeves, sun glasses (UVA+UVB protection) and wide brim hats (4-inch brim around the entire circumference of the hat) are also recommended for sun protection.  - Call for new or changing lesions.  MELANOCYTIC NEVI - Tan-brown and/or pink-flesh-colored symmetric macules and papules - Benign appearing on exam today - Observation - Call clinic for new or changing moles - Recommend daily use of broad spectrum spf  30+ sunscreen to sun-exposed areas.   LENTIGINES Exam: scattered tan macules Due to sun exposure Treatment Plan: Benign-appearing, observe. Recommend daily broad spectrum sunscreen SPF 30+ to sun-exposed areas, reapply every 2 hours as needed.  Call for any changes   HEMANGIOMA Exam: red papule(s) Discussed benign nature. Recommend observation. Call for changes.   SEBORRHEIC KERATOSIS - Stuck-on, waxy, tan-brown papules and/or plaques  - Benign-appearing - Discussed benign etiology and prognosis. - Observe - Call for any changes  HISTORY OF MELANOMA right lower leg - No evidence of recurrence today - No lymphadenopathy - Recommend regular full body skin exams - Recommend daily broad spectrum sunscreen SPF 30+ to sun-exposed areas, reapply every 2 hours as needed.  - Call if any new or changing lesions are noted between office visits   ATOPIC DERMATITIS hand- left dorsal hand Exam: Scaly pink papules coalescing to plaques  Atopic dermatitis (eczema) is a chronic, relapsing, pruritic condition that can significantly affect quality of life. It is often associated with allergic rhinitis and/or asthma and can require treatment with topical medications, phototherapy, or in severe cases biologic injectable medication (Dupixent; Adbry) or Oral JAK inhibitors.  Treatment Plan: Clobetasol  oint to area bid prn  Recommend gentle skin care.  NEOPLASM OF UNCERTAIN BEHAVIOR OF SKIN Left Malar Cheek Skin / nail biopsy Type of biopsy: tangential   Informed consent: discussed and consent obtained   Timeout: patient name, date of birth, surgical site, and procedure verified   Procedure prep:  Patient was prepped and draped in usual sterile fashion Prep type:  Isopropyl alcohol Anesthesia: the lesion was anesthetized in a standard fashion   Anesthetic:  1% lidocaine  w/ epinephrine  1-100,000 buffered w/ 8.4% NaHCO3 Instrument used: DermaBlade   Hemostasis achieved with: pressure and aluminum  chloride   Outcome: patient tolerated procedure well   Post-procedure details: sterile dressing applied and wound care instructions given   Dressing type: bandage and pressure dressing   Specimen 1 - Surgical pathology Differential Diagnosis: R/O NMSC  Check Margins: No ACTINIC SKIN DAMAGE   LENTIGINES   MULTIPLE BENIGN NEVI   CHERRY ANGIOMA   SEBORRHEIC KERATOSES   ATOPIC DERMATITIS, UNSPECIFIED TYPE   PERSONAL HISTORY OF MALIGNANT MELANOMA OF SKIN    Return in about 1 year (around 01/18/2025) for TBSE.  I, Wilson Hasten, CMA, am acting as scribe for Deneise Finlay, MD.   Documentation: I have reviewed the above documentation for accuracy and completeness, and I agree with the above.  Deneise Finlay, MD

## 2024-01-21 LAB — SURGICAL PATHOLOGY

## 2024-01-22 ENCOUNTER — Ambulatory Visit: Payer: Self-pay | Admitting: Dermatology

## 2024-01-28 ENCOUNTER — Telehealth: Payer: Self-pay | Admitting: Internal Medicine

## 2024-01-28 NOTE — Telephone Encounter (Signed)
 I placed a SWO for this patient in your mailbox. If could please fill it out and return to [facility]. Thanks!

## 2024-02-03 NOTE — Telephone Encounter (Signed)
 It was placed in the mail box it just says "[facility]" at the top and had a sticky note on it that said please fill out and return to [facility].

## 2024-02-05 NOTE — Telephone Encounter (Signed)
 Dr. Linder Revere remembers signing this last week.  Did you get this?  Thanks!

## 2024-03-04 ENCOUNTER — Other Ambulatory Visit (HOSPITAL_BASED_OUTPATIENT_CLINIC_OR_DEPARTMENT_OTHER): Payer: Self-pay | Admitting: Cardiology

## 2024-03-04 DIAGNOSIS — I2510 Atherosclerotic heart disease of native coronary artery without angina pectoris: Secondary | ICD-10-CM

## 2024-04-13 ENCOUNTER — Ambulatory Visit: Admitting: Dermatology

## 2024-04-20 ENCOUNTER — Ambulatory Visit: Admitting: Dermatology

## 2024-04-23 ENCOUNTER — Other Ambulatory Visit: Payer: Self-pay | Admitting: Internal Medicine

## 2024-06-04 ENCOUNTER — Other Ambulatory Visit: Payer: Self-pay | Admitting: Family

## 2024-06-04 DIAGNOSIS — I2510 Atherosclerotic heart disease of native coronary artery without angina pectoris: Secondary | ICD-10-CM

## 2024-06-04 DIAGNOSIS — I5042 Chronic combined systolic (congestive) and diastolic (congestive) heart failure: Secondary | ICD-10-CM

## 2024-06-10 ENCOUNTER — Ambulatory Visit: Admitting: Internal Medicine

## 2024-06-14 NOTE — Progress Notes (Unsigned)
 Patient ID: [name], female    DOB: 05-03-1966, 58 y.o.   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 Walk test on room air 06/28/2016-95%, 95%, 95%, 93%, peak heart rate 120/minute. No desaturation after 3185 feet. Office Spirometry 06/28/2016-limited validity due to cough. Restriction of exhaled volume. FVC 1.79/50%, FEV1 1.64/57%, ratio 0.80. FENO- 5 Walk Test on room Air- Qualified for home O2 04/10/17 PFT 06/17/17- severe obstruction, mild restriction, diffusion severely reduced. Insignificant response to bronchodilator. FVC 1.73/47%, FEV1 1.10/38%, ratio 0.64, TLC 77%, DLCO 32% CHF- acute hosp 05/2017- CHF, CAD, EF 20-25%. Stent. Added Plavix , spironolactone , Coreg , Cozaar  Walk test 02/10/2019- desat to 88%, on 2L o2 was 94% Echocardiogram 09/16/18- DD, EF 65-70% Walk Test O2 Qualifying 12/09/23- Room Air rest 96%, ambulation dropped to 88% on room air, ambulating 92% on 3L pulse. 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  Needs qualifying walk test for oxygen per insurance. She wasn't able to afford sleep study ordered last year. Walk Test O2 Qualifying 12/09/23- Room Air rest 96%, ambulation dropped to 88% on room air, ambulating 92% on 3L pulse. Pending ONOX on room air to requalify for home concentrator. Discussed the use of AI scribe software for clinical note transcription with the patient, who gave verbal consent to proceed.  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 She uses nighttime oxygen as prescribed, but is confused about the need for an overnight oximetry test, which she initially thought was a sleep apnea test. She expresses frustration about the cost of the test and the weight of her portable oxygen concentrator.  The patient reports a recent episode of respiratory illness, which she describes as a COPD flare. During this episode, she experienced severe coughing and difficulty expectorating, and her oxygen saturation dropped to 88%. She was treated with a 10-day course of antibiotics, which improved her symptoms.  The patient also mentions a history of lung nodules, which were previously evaluated with CT scans and chest x-rays. She expresses concern about a recent denial of coverage for a CT scan by her insurance. This was ordered by her PCP. I think she is referring to a low dose screening chest CT. Most recent CXR at her last PCP office visit with no changes reported to her.  In addition to her respiratory issues, the patient mentions the use of a nebulizer machine during her COPD flare-ups. She also uses a flutter valve to help clear her lungs. She expresses frustration about the frequency of her flare-ups last year and the impact on her  quality of  life.    Assessment and Plan:    Chronic Obstructive Pulmonary Disease (COPD) Frequent exacerbations with recent severe episode. Requires ongoing management with Breztri , nebulizer, and flutter valve. Uses portable oxygen concentrator during activities. -Consider Ohtuvayre , but too expensive without assistance - Continue Breztri  daily. - Use nebulizer as needed during exacerbations. - Perform overnight oximetry without supplemental oxygen to assess qualification for nocturnal oxygen therapy. Walk test done today. - Continue using flutter valve to aid in secretion clearance. - Continue light portable oxygen concentrator options.  Chronic Hypoxic Respiratory Failure -Walk test qualifies for POC 3L pulse for exertion. -Uses nocturnal oxygen therapy. Overnight oximetry planned to assess qualification for continued therapy.  Pulmonary Fibrosis- Radiation fibrosis Stable with no recent imaging to suggest progression. Past CT scans showed inflammation and fibrosis. - Coordinate with primary care for imaging needs.  Allergic Rhinitis Significant symptoms due to pollen exposure, exacerbated by environmental factors. - Provide anticipatory guidance on pollen exposure and management.      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  Productive Cough in mornings. Discussed the use of AI scribe software for clinical note transcription with the patient, who gave verbal consent to proceed.  History of Present Illness    [name] is a 58 year old female with COPD who presents with frequent exacerbations and ineffective response to current treatment.  She experiences frequent exacerbations with increased mucus production and coughing, starting at night and progressing to daytime symptoms. These episodes require medical intervention and do not resolve independently. Her PCP has given multiple rounds of antibiotics and prednisone  this summer. We will check eosinophils, considering Dupixent, and we can consider Ohtuvayre  nebs.  She uses Breztri  for COPD management, but it is no longer effective. During exacerbations, she receives doxycycline , prednisone , and occasionally cough medicine. Prednisone  provides temporary relief for one to two weeks.  She uses oxygen at two liters continuously, including at night. Her breathing is not optimal today. She typically does not use oxygen for short outings but does for longer activities like grocery shopping.     Assessment and Plan:    Chronic obstructive pulmonary disease with frequent exacerbations COPD with frequent exacerbations, Breztri  seems ineffective. Consideration for Trelegy due to inadequate control. Potential for biologics if eosinophil count elevated. - Switch to Trelegy, triple therapy inhaler. - Discussed thrush risk with Trelegy, advised preventive measures. - Evaluate for biologics like Dupixent if eosinophil count elevated. - Consider Ohtuvayre  nebulizer maintenance medication. - Provide Trelegy samples, instruct on use and thrush prevention. - Order chest x-ray. - Order blood count for eosinophil levels. - Evaluate insurance coverage for Trelegy. - Consider Dupixent if eosinophil count elevated. - Consider Otuvir nebulizer treatment if needed.  Chronic respiratory failure with hypoxia Chronic respiratory failure with hypoxia managed with continuous oxygen therapy. Occasional dyspnea reported. - Continue oxygen therapy at two liters per minute  continuously.          Review of Systems-+ = positive Constitutional:   No-   weight loss, night sweats, fevers, chills, fatigue, lassitude. HEENT:   No-  headaches, difficulty swallowing, tooth/dental problems, sore throat,  mild sneezing,no- itching, ear ache, nasal congestion, post nasal drip,  CV:  No-   chest pain, orthopnea, PND, swelling in lower extremities, anasarca, dizziness, palpitations Resp: + shortness of breath with exertion or at rest.              +productive cough, + non-productive cough,  No- coughing up of blood.               change in color of mucus. + wheezing.   Skin: No-   rash or lesions. GI:  +HPI GU:   MS:  No-   joint pain or swelling.   Neuro-     nothing unusual Psych:  No- change in mood or affect. No depression or anxiety.  No memory loss.   Objective:   Physical Exam    General- Alert, Oriented, Affect-appropriate, Distress - NAD,  Skin- rash-none, lesions- none, excoriation- none.  Lymphadenopathy- none Head- atraumatic            Eyes- Gross vision intact, PERRLA, conjunctivae clear secretions            Ears- Hearing, canals-normal            Nose- , no-Septal dev, mucus, polyps, erosion, perforation             Throat- Mallampati IV , mucosa clear , drainage- none, tonsils- atrophic,  Neck- flexible , trachea midline, no stridor , thyroid  nl, carotid no bruit Chest - symmetrical excursion , unlabored           Heart/CV- RRR rapid , no murmur , no gallop  , no rub, nl s1 s2                           - JVD none , edema- none, stasis changes- none, varices- none           Lung-   +diminished, +Wheeze-slight, cough+, dullness-none, rub- none. Rales- none           Chest wall- + soft mobile mass L lateral chest wall, c/w lipoma. Abd-  Br/ Gen/ Rectal- Not done, not indicated Extrem- cyanosis- none, clubbing, none, atrophy- none, strength- nl,    Neuro- grossly intact to observation

## 2024-06-15 ENCOUNTER — Ambulatory Visit

## 2024-06-15 ENCOUNTER — Encounter: Payer: Self-pay | Admitting: Internal Medicine

## 2024-06-15 ENCOUNTER — Ambulatory Visit: Admitting: Internal Medicine

## 2024-06-15 VITALS — BP 128/72 | HR 88 | Temp 97.6°F | Ht 65.0 in | Wt 180.0 lb

## 2024-06-15 DIAGNOSIS — J441 Chronic obstructive pulmonary disease with (acute) exacerbation: Secondary | ICD-10-CM | POA: Diagnosis not present

## 2024-06-15 LAB — CBC WITH DIFFERENTIAL/PLATELET
Basophils Absolute: 0 K/uL (ref 0.0–0.1)
Basophils Relative: 0.6 % (ref 0.0–3.0)
Eosinophils Absolute: 0.1 K/uL (ref 0.0–0.7)
Eosinophils Relative: 1 % (ref 0.0–5.0)
HCT: 36.4 % (ref 36.0–46.0)
Hemoglobin: 12.2 g/dL (ref 12.0–15.0)
Lymphocytes Relative: 15.3 % (ref 12.0–46.0)
Lymphs Abs: 0.9 K/uL (ref 0.7–4.0)
MCHC: 33.5 g/dL (ref 30.0–36.0)
MCV: 79 fl (ref 78.0–100.0)
Monocytes Absolute: 0.6 K/uL (ref 0.1–1.0)
Monocytes Relative: 9.7 % (ref 3.0–12.0)
Neutro Abs: 4.5 K/uL (ref 1.4–7.7)
Neutrophils Relative %: 73.4 % (ref 43.0–77.0)
Platelets: 240 K/uL (ref 150.0–400.0)
RBC: 4.61 Mil/uL (ref 3.87–5.11)
RDW: 16.1 % — ABNORMAL HIGH (ref 11.5–15.5)
WBC: 6.1 K/uL (ref 4.0–10.5)

## 2024-06-15 MED ORDER — TRELEGY ELLIPTA 100-62.5-25 MCG/ACT IN AEPB: 1.0000 | INHALATION_SPRAY | Freq: Every day | RESPIRATORY_TRACT | Status: DC

## 2024-06-15 NOTE — Patient Instructions (Signed)
 Order- CXR    dx COPD exacerbation  Order- lab- CBC w dif   dx COPD exacerbation Order- samples x 2 Trelegy 100   Inhale 1 puff then rinse mouth well, one time daily. If you get thrush in your mouth easily, try using it before you eat.  Fluvax standard

## 2024-06-16 ENCOUNTER — Ambulatory Visit: Payer: Self-pay | Admitting: Internal Medicine

## 2024-06-17 ENCOUNTER — Telehealth: Payer: Self-pay | Admitting: Internal Medicine

## 2024-06-17 NOTE — Telephone Encounter (Signed)
 Pt is requesting a prescription be done for Fluticasone -Umeclidin-Vilant (TRELEGY ELLIPTA) 100-62.5-25 MCG/ACT AEPB [[id number]] . Was given sample on 06/15/24 and would like to get the prescription done.

## 2024-06-18 NOTE — Progress Notes (Signed)
 Called and spoke to pt - advised of CXR results per Dr. Neysa. Pt verbalized understanding, NFN.

## 2024-06-21 ENCOUNTER — Telehealth: Payer: Self-pay

## 2024-06-21 MED ORDER — TRELEGY ELLIPTA 100-62.5-25 MCG/ACT IN AEPB: 1.0000 | INHALATION_SPRAY | Freq: Every day | RESPIRATORY_TRACT | 4 refills | Status: DC

## 2024-06-21 NOTE — Telephone Encounter (Signed)
 Received fax from [employer] confirming receipt of enrollment form.   Patient ID: [id number]

## 2024-06-21 NOTE — Telephone Encounter (Signed)
 Called patient.  Will send in rx for Trelegy 100 mcg x 5 refills.  Patient states sample Dr. Neysa gave her is working very well.  [facility] at [address] and [address].

## 2024-06-21 NOTE — Telephone Encounter (Signed)
 Received Ohtuvayre  new start paperwork. Completed form and faxed with clinicals and insurance card copy to [facility]   Phone#: [phone] Fax#: [phone]

## 2024-06-23 NOTE — Telephone Encounter (Signed)
 Received fax from [facility] confirming receipt of rx.

## 2024-06-23 NOTE — Telephone Encounter (Signed)
 Received fax from [employer] with summary of benefits. Referral form for Ohtuvayre  received. Rx will be triaged to [facility].. Once benefits investigation completed, pharmacy will reach out the patient to schedule shipment. If medication is unaffordable, patient will need to express financial hardship to be referred back to [place] [facility] for patient assistance program pre-screening.   Patient ID: [id number] Pharmacy phone: [phone] [place] [facility] Phone#: [phone]

## 2024-07-08 ENCOUNTER — Other Ambulatory Visit: Payer: Self-pay | Admitting: Cardiovascular Disease

## 2024-07-08 DIAGNOSIS — I2510 Atherosclerotic heart disease of native coronary artery without angina pectoris: Secondary | ICD-10-CM

## 2024-07-27 ENCOUNTER — Other Ambulatory Visit: Payer: Self-pay | Admitting: Internal Medicine

## 2024-07-27 NOTE — Telephone Encounter (Signed)
Zolpidem refilled.

## 2024-07-27 NOTE — Telephone Encounter (Signed)
 Pt is requesting refill of controlled medication - please advise, thank you!  LOV: 06/15/24

## 2024-08-03 ENCOUNTER — Inpatient Hospital Stay (HOSPITAL_COMMUNITY)
Admission: EM | Admit: 2024-08-03 | Discharge: 2024-08-08 | DRG: 871 | Disposition: A | Attending: Internal Medicine | Admitting: Internal Medicine

## 2024-08-03 ENCOUNTER — Emergency Department (HOSPITAL_COMMUNITY)

## 2024-08-03 ENCOUNTER — Encounter (HOSPITAL_COMMUNITY): Payer: Self-pay

## 2024-08-03 DIAGNOSIS — I509 Heart failure, unspecified: Secondary | ICD-10-CM

## 2024-08-03 DIAGNOSIS — C3491 Malignant neoplasm of unspecified part of right bronchus or lung: Secondary | ICD-10-CM | POA: Diagnosis present

## 2024-08-03 DIAGNOSIS — A419 Sepsis, unspecified organism: Principal | ICD-10-CM

## 2024-08-03 DIAGNOSIS — I5042 Chronic combined systolic (congestive) and diastolic (congestive) heart failure: Secondary | ICD-10-CM

## 2024-08-03 DIAGNOSIS — J9621 Acute and chronic respiratory failure with hypoxia: Secondary | ICD-10-CM | POA: Diagnosis present

## 2024-08-03 DIAGNOSIS — J8482 Adult pulmonary Langerhans cell histiocytosis: Secondary | ICD-10-CM | POA: Diagnosis present

## 2024-08-03 DIAGNOSIS — J449 Chronic obstructive pulmonary disease, unspecified: Secondary | ICD-10-CM | POA: Diagnosis present

## 2024-08-03 DIAGNOSIS — K5900 Constipation, unspecified: Secondary | ICD-10-CM | POA: Diagnosis present

## 2024-08-03 DIAGNOSIS — J101 Influenza due to other identified influenza virus with other respiratory manifestations: Secondary | ICD-10-CM | POA: Diagnosis present

## 2024-08-03 DIAGNOSIS — I2510 Atherosclerotic heart disease of native coronary artery without angina pectoris: Secondary | ICD-10-CM

## 2024-08-03 DIAGNOSIS — C4371 Malignant melanoma of right lower limb, including hip: Secondary | ICD-10-CM | POA: Diagnosis present

## 2024-08-03 DIAGNOSIS — I272 Pulmonary hypertension, unspecified: Secondary | ICD-10-CM

## 2024-08-03 DIAGNOSIS — J441 Chronic obstructive pulmonary disease with (acute) exacerbation: Secondary | ICD-10-CM | POA: Diagnosis present

## 2024-08-03 LAB — COMPREHENSIVE METABOLIC PANEL WITH GFR
ALT: 14 U/L (ref 0–44)
AST: 23 U/L (ref 15–41)
Albumin: 3.4 g/dL — ABNORMAL LOW (ref 3.5–5.0)
Alkaline Phosphatase: 78 U/L (ref 38–126)
Anion gap: 12 (ref 5–15)
BUN: 8 mg/dL (ref 6–20)
CO2: 22 mmol/L (ref 22–32)
Calcium: 8.3 mg/dL — ABNORMAL LOW (ref 8.9–10.3)
Chloride: 100 mmol/L (ref 98–111)
Creatinine, Ser: 0.96 mg/dL (ref 0.44–1.00)
GFR, Estimated: >60 mL/min (ref >60)
Glucose, Bld: 104 mg/dL — ABNORMAL HIGH (ref 70–99)
Potassium: 4.5 mmol/L (ref 3.5–5.1)
Sodium: 134 mmol/L — ABNORMAL LOW (ref 135–145)
Total Bilirubin: 1.4 mg/dL — ABNORMAL HIGH (ref 0.0–1.2)
Total Protein: 7.1 g/dL (ref 6.5–8.1)

## 2024-08-03 LAB — URINALYSIS, W/ REFLEX TO CULTURE (INFECTION SUSPECTED)
Bacteria, UA: NONE SEEN
Bilirubin Urine: NEGATIVE
Glucose, UA: NEGATIVE mg/dL
Hgb urine dipstick: NEGATIVE
Ketones, ur: NEGATIVE mg/dL
Leukocytes,Ua: NEGATIVE
Nitrite: NEGATIVE
Protein, ur: NEGATIVE mg/dL
Specific Gravity, Urine: 1.009 (ref 1.005–1.030)
pH: 8 (ref 5.0–8.0)

## 2024-08-03 LAB — CBC WITH DIFFERENTIAL/PLATELET
Abs Immature Granulocytes: 0.08 K/uL — ABNORMAL HIGH (ref 0.00–0.07)
Basophils Absolute: 0 K/uL (ref 0.0–0.1)
Basophils Relative: 0 %
Eosinophils Absolute: 0 K/uL (ref 0.0–0.5)
Eosinophils Relative: 0 %
HCT: 39.7 % (ref 36.0–46.0)
Hemoglobin: 12.4 g/dL (ref 12.0–15.0)
Immature Granulocytes: 1 %
Lymphocytes Relative: 8 %
Lymphs Abs: 1 K/uL (ref 0.7–4.0)
MCH: 26.2 pg (ref 26.0–34.0)
MCHC: 31.2 g/dL (ref 30.0–36.0)
MCV: 83.9 fL (ref 80.0–100.0)
Monocytes Absolute: 1 K/uL (ref 0.1–1.0)
Monocytes Relative: 7 %
Neutro Abs: 11.3 K/uL — ABNORMAL HIGH (ref 1.7–7.7)
Neutrophils Relative %: 84 %
Platelets: 222 K/uL (ref 150–400)
RBC: 4.73 MIL/uL (ref 3.87–5.11)
RDW: 14.4 % (ref 11.5–15.5)
WBC: 13.4 K/uL — ABNORMAL HIGH (ref 4.0–10.5)
nRBC: 0 % (ref 0.0–0.2)

## 2024-08-03 LAB — PROTIME-INR
INR: 1.1 (ref 0.8–1.2)
Prothrombin Time: 15.3 s — ABNORMAL HIGH (ref 11.4–15.2)

## 2024-08-03 LAB — I-STAT CG4 LACTIC ACID, ED: Lactic Acid, Venous: 1.8 mmol/L (ref 0.5–1.9)

## 2024-08-03 MED ORDER — LACTATED RINGERS IV BOLUS (SEPSIS): 500.0000 mL | Freq: Once | INTRAVENOUS | Status: DC

## 2024-08-03 MED ORDER — SODIUM CHLORIDE 0.9 % IV SOLN
500.0000 mg | Freq: Once | INTRAVENOUS | Status: AC
  Administered 2024-08-03: 500 mg via INTRAVENOUS
  Filled 2024-08-03: qty 5

## 2024-08-03 MED ORDER — SODIUM CHLORIDE 0.9 % IV SOLN
2.0000 g | Freq: Once | INTRAVENOUS | Status: AC
  Administered 2024-08-03: 2 g via INTRAVENOUS
  Filled 2024-08-03: qty 20

## 2024-08-03 MED ORDER — IOHEXOL 350 MG/ML SOLN
75.0000 mL | Freq: Once | INTRAVENOUS | Status: AC | PRN
  Administered 2024-08-03: 75 mL via INTRAVENOUS

## 2024-08-03 MED ORDER — LACTATED RINGERS IV BOLUS (SEPSIS): 1000.0000 mL | Freq: Once | INTRAVENOUS | Status: DC

## 2024-08-03 MED ORDER — ONDANSETRON HCL 4 MG/2ML IJ SOLN
4.0000 mg | Freq: Once | INTRAMUSCULAR | Status: AC
  Administered 2024-08-03: 4 mg via INTRAVENOUS
  Filled 2024-08-03: qty 2

## 2024-08-03 MED ORDER — LACTATED RINGERS IV SOLN: INTRAVENOUS | Status: DC

## 2024-08-03 MED ORDER — LACTATED RINGERS IV BOLUS (SEPSIS)
1000.0000 mL | Freq: Once | INTRAVENOUS | Status: AC
  Administered 2024-08-03: 1000 mL via INTRAVENOUS

## 2024-08-03 NOTE — Sepsis Progress Note (Signed)
 Notified bedside nurse of need to draw lactic acid and blood cultures. After blood culture obtained, hang antibiotic.

## 2024-08-03 NOTE — ED Triage Notes (Addendum)
 Pt c.o sob and generalized weakness since Sunday night. Productive cough, states she feels like passing out after each coughing spell. Hx of COPD. Pt saw her PCP last week and given rx for levaquin 

## 2024-08-03 NOTE — Sepsis Progress Note (Signed)
 Code Sepsis protocol being monitored by [employer].

## 2024-08-03 NOTE — ED Notes (Signed)
 Patient transported to X-ray

## 2024-08-03 NOTE — ED Provider Notes (Signed)
 [place] [facility]GENCY DEPARTMENT AT [facility] Provider Note   CSN: [id number] Arrival date & time: 08/03/24  1650     Patient presents with: Shortness of Breath and Weakness   [name] is a 58 y.o. female.   The history is provided by the patient and medical records. No language interpreter was used.  Shortness of Breath Weakness Associated symptoms: shortness of breath      58 year old female with significant history of non-small cell lung cancer, CHF, COPD, anxiety, CAD presenting to ER with complaint of weakness.  Patient states for the past several weeks she has had recurrent shortness of breath, cough, generalized fatigue.  She was given Levaquin  for her symptoms for which she took 1 week and states she felt a little better after she finished her course of antibiotic which was about 4 days ago however since then she is having increased cough, fatigue, fever as high as 103 at home, shortness of breath, nausea and decrease in appetite.  No urinary symptoms.  She does have home supplemental oxygen that she normally use at 1 L but did not notice any significant improvement with usage.  Patient felt she is having worsening of her pneumonia.  Prior to Admission medications   Medication Sig Start Date End Date Taking? Authorizing Provider  acetaminophen  (TYLENOL ) 500 MG tablet Take 500 mg by mouth every 8 (eight) hours as needed for moderate pain.    [provider]  albuterol  (VENTOLIN  HFA) 108 (90 Base) MCG/ACT inhaler INHALE 2 PUFFS INTO THE LUNGS EVERY 4 TO 6 HOURS AS NEEDED FOR WHEEZING OR SHORTNESS OF BREATH 04/23/24   Neysa Rama D, MD  ALPRAZolam  (XANAX ) 0.5 MG tablet Take 1 tablet (0.5 mg total) by mouth 2 (two) times daily as needed for up to 1 dose for anxiety. 02/12/18   Madelon Donald HERO, DO  atorvastatin  (LIPITOR) 40 MG tablet Take 1 tablet (40 mg total) by mouth daily. 01/15/24   Vannie Reche RAMAN, NP  BREZTRI  AEROSPHERE 160-9-4.8 MCG/ACT AERO  INHALE 2 PUFFS INTO THE LUNGS IN THE MORNING AND AT BEDTIME 12/05/23   Neysa Rama D, MD  carvedilol  (COREG ) 6.25 MG tablet Take 1 tablet (6.25 mg total) by mouth 2 (two) times daily with a meal. 01/15/24   Walker, Caitlin S, NP  clobetasol  ointment (TEMOVATE ) 0.05 % Apply 1 Application topically 2 (two) times daily. 01/19/24   Paci, Karina M, MD  clopidogrel  (PLAVIX ) 75 MG tablet Take 1 tablet (75 mg total) by mouth daily. 01/15/24   Walker, Caitlin S, NP  ezetimibe  (ZETIA ) 10 MG tablet Take 1 tablet (10 mg total) by mouth daily. 01/15/24   Walker, Caitlin S, NP  Fluticasone -Umeclidin-Vilant (TRELEGY ELLIPTA ) 100-62.5-25 MCG/ACT AEPB Inhale 1 puff into the lungs daily. 06/21/24   Neysa Rama D, MD  furosemide  (LASIX ) 20 MG tablet Take 1 tablet (20 mg total) by mouth daily. 01/15/24   Walker, Caitlin S, NP  ipratropium-albuterol  (DUONEB) 0.5-2.5 (3) MG/3ML SOLN USE 1 VIAL VIA NEBULIZER EVERY 6 HOURS AS NEEDED 03/18/23   Neysa Rama D, MD  losartan  (COZAAR ) 25 MG tablet Take 1 tablet (25 mg total) by mouth daily. 01/15/24   Walker, Caitlin S, NP  nitroGLYCERIN  (NITROSTAT ) 0.4 MG SL tablet PLACE 1 TABLET(0.4 MG) UNDER THE TONGUE EVERY 5 MINUTES AS NEEDED FOR CHEST PAIN 07/08/24   Raford Riggs, MD  OXYGEN Inhale 1 L/min into the lungs as needed.    [provider]  pantoprazole (PROTONIX) 40 MG tablet  Take 40 mg by mouth daily.  09/15/18   [provider]  potassium chloride  (KLOR-CON ) 10 MEQ tablet Take 1 tablet (10 mEq total) by mouth daily. 01/15/24   Walker, Caitlin S, NP  spironolactone  (ALDACTONE ) 25 MG tablet Take 1 tablet (25 mg total) by mouth daily. 01/15/24   Walker, Caitlin S, NP  triamcinolone cream (KENALOG) 0.1 % Apply topically 2 (two) times daily. 04/06/21   [provider]  zolpidem  (AMBIEN ) 5 MG tablet TAKE 1 TABLET BY MOUTH AT BEDTIME AS NEEDED FOR SLEEP 07/27/24   Neysa Reggy BIRCH, MD    Allergies: Patient has no known allergies.    Review of Systems   Respiratory:  Positive for shortness of breath.   Neurological:  Positive for weakness.  All other systems reviewed and are negative.   Updated Vital Signs BP (!) 93/51 (BP Location: Left Arm)   Pulse (!) 113   Temp 98.5 F (36.9 C)   Resp (!) 24   Ht 5' 5 (1.651 m)   Wt 81.6 kg   SpO2 91%   BMI 29.95 kg/m   Physical Exam Vitals and nursing note reviewed.  Constitutional:      General: She is not in acute distress.    Appearance: She is well-developed. She is obese.  HENT:     Head: Atraumatic.  Eyes:     Conjunctiva/sclera: Conjunctivae normal.  Cardiovascular:     Rate and Rhythm: Tachycardia present.     Pulses: Normal pulses.     Heart sounds: Normal heart sounds.  Pulmonary:     Effort: Pulmonary effort is normal.     Breath sounds: Wheezing present. No rhonchi or rales.  Abdominal:     Palpations: Abdomen is soft.     Tenderness: There is no abdominal tenderness.  Musculoskeletal:     Cervical back: Neck supple.     Right lower leg: No edema.     Left lower leg: No edema.  Skin:    Findings: No rash.  Neurological:     Mental Status: She is alert. Mental status is at baseline.  Psychiatric:        Mood and Affect: Mood normal.     (all labs ordered are listed, but only abnormal results are displayed) Labs Reviewed  COMPREHENSIVE METABOLIC PANEL WITH GFR - Abnormal; Notable for the following components:      Result Value   Sodium 134 (*)    Glucose, Bld 104 (*)    Calcium  8.3 (*)    Albumin 3.4 (*)    Total Bilirubin 1.4 (*)    All other components within normal limits  CBC WITH DIFFERENTIAL/PLATELET - Abnormal; Notable for the following components:   WBC 13.4 (*)    Neutro Abs 11.3 (*)    Abs Immature Granulocytes 0.08 (*)    All other components within normal limits  PROTIME-INR - Abnormal; Notable for the following components:   Prothrombin Time 15.3 (*)    All other components within normal limits  RESP PANEL BY RT-PCR (RSV, FLU A&B,  COVID)  RVPGX2  CULTURE, BLOOD (ROUTINE X 2)  CULTURE, BLOOD (ROUTINE X 2)  URINALYSIS, W/ REFLEX TO CULTURE (INFECTION SUSPECTED)  I-STAT CG4 LACTIC ACID, ED  I-STAT CG4 LACTIC ACID, ED    EKG: EKG Interpretation Date/Time:  Tuesday August 03 2024 18:59:45 EST Ventricular Rate:  108 PR Interval:  180 QRS Duration:  110 QT Interval:  363 QTC Calculation: 487 R Axis:   60  Text  Interpretation: Ectopic atrial tachycardia, unifocal Anteroseptal infarct, age indeterminate Lateral leads are also involved Confirmed by Ula Barter [phone]) on 08/03/2024 7:23:22 PM  Radiology: DG Chest 2 View Result Date: 08/03/2024 CLINICAL DATA:  Questionable sepsis - evaluate for abnormality EXAM: CHEST - 2 VIEW COMPARISON:  06/15/2024, 01/02/2023 FINDINGS: Right-sided volume loss and apical pleural thickening with similar paramediastinal consolidation in the right upper lobe. Right suprahilar surgical clips. No pleural effusion or pneumothorax. Rightward shift of the trachea and mediastinal structures, unchanged. No cardiomegaly. No acute fracture or destructive lesions. Multilevel thoracic osteophytosis. IMPRESSION: No acute cardiopulmonary abnormality. Electronically Signed   By: Rogelia Myers M.D.   On: 08/03/2024 18:55     .Critical Care  Performed by: Nivia Colon, PA-C Authorized by: Nivia Colon, PA-C   Critical care provider statement:    Critical care time (minutes):  30   Critical care was time spent personally by me on the following activities:  Development of treatment plan with patient or surrogate, discussions with consultants, evaluation of patient's response to treatment, examination of patient, ordering and review of laboratory studies, ordering and review of radiographic studies, ordering and performing treatments and interventions, pulse oximetry, re-evaluation of patient's condition and review of old charts    Medications Ordered in the ED  lactated ringers  infusion ( Intravenous  New Bag/Given 08/03/24 1835)  lactated ringers  bolus 1,000 mL (1,000 mLs Intravenous New Bag/Given 08/03/24 2125)    And  lactated ringers  bolus 1,000 mL (has no administration in time range)    And  lactated ringers  bolus 500 mL (has no administration in time range)  azithromycin  (ZITHROMAX ) 500 mg in sodium chloride  0.9 % 250 mL IVPB (500 mg Intravenous New Bag/Given 08/03/24 2147)  cefTRIAXone (ROCEPHIN) 2 g in sodium chloride  0.9 % 100 mL IVPB (0 g Intravenous Stopped 08/03/24 2117)  ondansetron  (ZOFRAN ) injection 4 mg (4 mg Intravenous Given 08/03/24 2121)                                    Medical Decision Making Amount and/or Complexity of Data Reviewed Labs: ordered. Radiology: ordered.  Risk Prescription drug management.   BP (!) 93/51 (BP Location: Left Arm)   Pulse (!) 113   Temp 98.5 F (36.9 C)   Resp (!) 24   Ht 5' 5 (1.651 m)   Wt 81.6 kg   SpO2 91%   BMI 29.95 kg/m   70:30 PM  58 year old female with significant history of non-small cell lung cancer, CHF, COPD, anxiety, CAD presenting to ER with complaint of weakness.  Patient states for the past several weeks she has had recurrent shortness of breath, cough, generalized fatigue.  She was given Levaquin  for her symptoms for which she took 1 week and states she felt a little better after she finished her course of antibiotic which was about 4 days ago however since then she is having increased cough, fatigue, fever as high as 103 at home, shortness of breath, nausea and decrease in appetite.  No urinary symptoms.  She does have home supplemental oxygen that she normally use at 1 L but did not notice any significant improvement with usage.  Patient felt she is having worsening of her pneumonia.  On exam, patient is actively coughing, appears uncomfortable.  She is able to speak in complete sentences.  Lungs with some faint wheezes but otherwise no other significant adventitious breath sounds.  No evidence  of peripheral  edema.  Vitals are notable for soft blood pressure of 93/51, patient is tachycardic with heart rate of 113, she is tachypneic with a respiratory of 24, O2 sats is 91% on room air.  Her oral temperature is 98.5.  I have activated code sepsis with suspected pulmonary source.  -Labs ordered, independently viewed and interpreted by me.  Labs remarkable for elevated white count of 13.4.  Normal lactic acid.  Normal renal function. -The patient was maintained on a cardiac monitor.  I personally viewed and interpreted the cardiac monitored which showed an underlying rhythm of: Sinus tachycardia -Imaging independently viewed and interpreted by me and I agree with radiologist's interpretation.  Result remarkable for initial chest x-ray unremarkable -This patient presents to the ED for concern of cough, this involves an extensive number of treatment options, and is a complaint that carries with it a high risk of complications and morbidity.  The differential diagnosis includes pneumonia, PE, pleurisy, CHF, anemia, pleural effusion -Co morbidities that complicate the patient evaluation includes CHF, COPD, OCD,, lung cancer -Treatment includes antibiotic including Rocephin, Zithromax , IV fluid -Reevaluation of the patient after these medicines showed that the patient improved -PCP office notes or outside notes reviewed -Discussion with attending Dr. Ula who will f/u on CT result and determine disposition -Escalation to admission/observation considered: dispo pending.     Final diagnoses:  Sepsis, due to unspecified organism, unspecified whether acute organ dysfunction present [facility])    ED Discharge Orders     None          Nivia Colon, PA-C 08/03/24 2157    Ula Prentice SAUNDERS, MD 08/03/24 2326

## 2024-08-03 NOTE — ED Notes (Signed)
 Patient transported to CT

## 2024-08-04 ENCOUNTER — Encounter (HOSPITAL_COMMUNITY): Payer: Self-pay | Admitting: Internal Medicine

## 2024-08-04 ENCOUNTER — Other Ambulatory Visit: Payer: Self-pay

## 2024-08-04 DIAGNOSIS — C4371 Malignant melanoma of right lower limb, including hip: Secondary | ICD-10-CM | POA: Diagnosis not present

## 2024-08-04 DIAGNOSIS — Z902 Acquired absence of lung [part of]: Secondary | ICD-10-CM | POA: Diagnosis not present

## 2024-08-04 DIAGNOSIS — J441 Chronic obstructive pulmonary disease with (acute) exacerbation: Secondary | ICD-10-CM | POA: Diagnosis present

## 2024-08-04 DIAGNOSIS — J701 Chronic and other pulmonary manifestations due to radiation: Secondary | ICD-10-CM | POA: Diagnosis present

## 2024-08-04 DIAGNOSIS — Z87891 Personal history of nicotine dependence: Secondary | ICD-10-CM | POA: Diagnosis not present

## 2024-08-04 DIAGNOSIS — J9621 Acute and chronic respiratory failure with hypoxia: Secondary | ICD-10-CM | POA: Diagnosis present

## 2024-08-04 DIAGNOSIS — J8482 Adult pulmonary Langerhans cell histiocytosis: Secondary | ICD-10-CM | POA: Diagnosis not present

## 2024-08-04 DIAGNOSIS — R0602 Shortness of breath: Secondary | ICD-10-CM | POA: Diagnosis present

## 2024-08-04 DIAGNOSIS — I2510 Atherosclerotic heart disease of native coronary artery without angina pectoris: Secondary | ICD-10-CM | POA: Diagnosis present

## 2024-08-04 DIAGNOSIS — J449 Chronic obstructive pulmonary disease, unspecified: Secondary | ICD-10-CM | POA: Diagnosis not present

## 2024-08-04 DIAGNOSIS — C3491 Malignant neoplasm of unspecified part of right bronchus or lung: Secondary | ICD-10-CM | POA: Diagnosis not present

## 2024-08-04 DIAGNOSIS — Z7902 Long term (current) use of antithrombotics/antiplatelets: Secondary | ICD-10-CM | POA: Diagnosis not present

## 2024-08-04 DIAGNOSIS — A419 Sepsis, unspecified organism: Secondary | ICD-10-CM | POA: Diagnosis present

## 2024-08-04 DIAGNOSIS — I351 Nonrheumatic aortic (valve) insufficiency: Secondary | ICD-10-CM | POA: Diagnosis present

## 2024-08-04 DIAGNOSIS — J439 Emphysema, unspecified: Secondary | ICD-10-CM | POA: Diagnosis present

## 2024-08-04 DIAGNOSIS — K5900 Constipation, unspecified: Secondary | ICD-10-CM | POA: Diagnosis present

## 2024-08-04 DIAGNOSIS — Z955 Presence of coronary angioplasty implant and graft: Secondary | ICD-10-CM | POA: Diagnosis not present

## 2024-08-04 DIAGNOSIS — Z85820 Personal history of malignant melanoma of skin: Secondary | ICD-10-CM | POA: Diagnosis not present

## 2024-08-04 DIAGNOSIS — K219 Gastro-esophageal reflux disease without esophagitis: Secondary | ICD-10-CM | POA: Diagnosis present

## 2024-08-04 DIAGNOSIS — I5030 Unspecified diastolic (congestive) heart failure: Secondary | ICD-10-CM

## 2024-08-04 DIAGNOSIS — Y842 Radiological procedure and radiotherapy as the cause of abnormal reaction of the patient, or of later complication, without mention of misadventure at the time of the procedure: Secondary | ICD-10-CM | POA: Diagnosis present

## 2024-08-04 DIAGNOSIS — R5381 Other malaise: Secondary | ICD-10-CM | POA: Diagnosis present

## 2024-08-04 DIAGNOSIS — I5042 Chronic combined systolic (congestive) and diastolic (congestive) heart failure: Secondary | ICD-10-CM | POA: Diagnosis present

## 2024-08-04 DIAGNOSIS — Z85118 Personal history of other malignant neoplasm of bronchus and lung: Secondary | ICD-10-CM | POA: Diagnosis not present

## 2024-08-04 DIAGNOSIS — J101 Influenza due to other identified influenza virus with other respiratory manifestations: Secondary | ICD-10-CM | POA: Diagnosis present

## 2024-08-04 DIAGNOSIS — R911 Solitary pulmonary nodule: Secondary | ICD-10-CM | POA: Diagnosis present

## 2024-08-04 DIAGNOSIS — E7800 Pure hypercholesterolemia, unspecified: Secondary | ICD-10-CM | POA: Diagnosis present

## 2024-08-04 DIAGNOSIS — C966 Unifocal Langerhans-cell histiocytosis: Secondary | ICD-10-CM | POA: Diagnosis present

## 2024-08-04 DIAGNOSIS — Z1152 Encounter for screening for COVID-19: Secondary | ICD-10-CM | POA: Diagnosis not present

## 2024-08-04 DIAGNOSIS — Z23 Encounter for immunization: Secondary | ICD-10-CM | POA: Diagnosis present

## 2024-08-04 DIAGNOSIS — J189 Pneumonia, unspecified organism: Secondary | ICD-10-CM | POA: Diagnosis present

## 2024-08-04 LAB — RESP PANEL BY RT-PCR (RSV, FLU A&B, COVID)  RVPGX2
Influenza A by PCR: POSITIVE — AB
Influenza B by PCR: NEGATIVE
Resp Syncytial Virus by PCR: NEGATIVE
SARS Coronavirus 2 by RT PCR: NEGATIVE

## 2024-08-04 LAB — COMPREHENSIVE METABOLIC PANEL WITH GFR
ALT: 11 U/L (ref 0–44)
AST: 18 U/L (ref 15–41)
Albumin: 3.1 g/dL — ABNORMAL LOW (ref 3.5–5.0)
Alkaline Phosphatase: 73 U/L (ref 38–126)
Anion gap: 14 (ref 5–15)
BUN: 7 mg/dL (ref 6–20)
CO2: 25 mmol/L (ref 22–32)
Calcium: 8.6 mg/dL — ABNORMAL LOW (ref 8.9–10.3)
Chloride: 97 mmol/L — ABNORMAL LOW (ref 98–111)
Creatinine, Ser: 1.01 mg/dL — ABNORMAL HIGH (ref 0.44–1.00)
GFR, Estimated: >60 mL/min (ref >60)
Glucose, Bld: 117 mg/dL — ABNORMAL HIGH (ref 70–99)
Potassium: 4 mmol/L (ref 3.5–5.1)
Sodium: 136 mmol/L (ref 135–145)
Total Bilirubin: 0.6 mg/dL (ref 0.0–1.2)
Total Protein: 6.5 g/dL (ref 6.5–8.1)

## 2024-08-04 LAB — CBC
HCT: 36 % (ref 36.0–46.0)
Hemoglobin: 11.9 g/dL — ABNORMAL LOW (ref 12.0–15.0)
MCH: 27.2 pg (ref 26.0–34.0)
MCHC: 33.1 g/dL (ref 30.0–36.0)
MCV: 82.4 fL (ref 80.0–100.0)
Platelets: 205 K/uL (ref 150–400)
RBC: 4.37 MIL/uL (ref 3.87–5.11)
RDW: 14.6 % (ref 11.5–15.5)
WBC: 10.7 K/uL — ABNORMAL HIGH (ref 4.0–10.5)
nRBC: 0 % (ref 0.0–0.2)

## 2024-08-04 LAB — BRAIN NATRIURETIC PEPTIDE: B Natriuretic Peptide: 110.4 pg/mL — ABNORMAL HIGH (ref 0.0–100.0)

## 2024-08-04 LAB — MAGNESIUM: Magnesium: 2 mg/dL (ref 1.7–2.4)

## 2024-08-04 LAB — HIV ANTIBODY (ROUTINE TESTING W REFLEX): HIV Screen 4th Generation wRfx: NONREACTIVE

## 2024-08-04 MED ORDER — ACETAMINOPHEN 650 MG RE SUPP: 650.0000 mg | Freq: Four times a day (QID) | RECTAL | Status: DC | PRN

## 2024-08-04 MED ORDER — METHYLPREDNISOLONE SODIUM SUCC 125 MG IJ SOLR
125.0000 mg | Freq: Once | INTRAMUSCULAR | Status: AC
  Administered 2024-08-04: 125 mg via INTRAVENOUS
  Filled 2024-08-04: qty 2

## 2024-08-04 MED ORDER — DOCUSATE SODIUM 100 MG PO CAPS
100.0000 mg | ORAL_CAPSULE | Freq: Every day | ORAL | Status: DC
  Administered 2024-08-04 – 2024-08-06 (×3): 100 mg via ORAL
  Filled 2024-08-04 (×4): qty 1

## 2024-08-04 MED ORDER — METHYLPREDNISOLONE SODIUM SUCC 125 MG IJ SOLR
80.0000 mg | Freq: Every day | INTRAMUSCULAR | Status: AC
  Administered 2024-08-04: 80 mg via INTRAVENOUS
  Filled 2024-08-04: qty 2

## 2024-08-04 MED ORDER — CLOPIDOGREL BISULFATE 75 MG PO TABS
75.0000 mg | ORAL_TABLET | Freq: Every day | ORAL | Status: DC
  Administered 2024-08-04 – 2024-08-08 (×5): 75 mg via ORAL
  Filled 2024-08-04 (×5): qty 1

## 2024-08-04 MED ORDER — ACETAMINOPHEN 325 MG PO TABS
650.0000 mg | ORAL_TABLET | Freq: Four times a day (QID) | ORAL | Status: DC | PRN
  Administered 2024-08-04 – 2024-08-07 (×4): 650 mg via ORAL
  Filled 2024-08-04 (×4): qty 2

## 2024-08-04 MED ORDER — ENOXAPARIN SODIUM 40 MG/0.4ML IJ SOSY
40.0000 mg | PREFILLED_SYRINGE | INTRAMUSCULAR | Status: DC
  Administered 2024-08-04 – 2024-08-07 (×4): 40 mg via SUBCUTANEOUS
  Filled 2024-08-04 (×4): qty 0.4

## 2024-08-04 MED ORDER — PANTOPRAZOLE SODIUM 40 MG PO TBEC
40.0000 mg | DELAYED_RELEASE_TABLET | Freq: Every day | ORAL | Status: DC
  Administered 2024-08-04 – 2024-08-08 (×5): 40 mg via ORAL
  Filled 2024-08-04 (×5): qty 1

## 2024-08-04 MED ORDER — INFLUENZA VIRUS VACC SPLIT PF (FLUZONE) 0.5 ML IM SUSY
0.5000 mL | PREFILLED_SYRINGE | INTRAMUSCULAR | Status: AC
  Administered 2024-08-05: 0.5 mL via INTRAMUSCULAR
  Filled 2024-08-04: qty 0.5

## 2024-08-04 MED ORDER — SODIUM CHLORIDE 0.9% FLUSH
3.0000 mL | Freq: Two times a day (BID) | INTRAVENOUS | Status: DC
  Administered 2024-08-04 – 2024-08-08 (×8): 3 mL via INTRAVENOUS

## 2024-08-04 MED ORDER — ALBUTEROL SULFATE (2.5 MG/3ML) 0.083% IN NEBU
2.5000 mg | INHALATION_SOLUTION | RESPIRATORY_TRACT | Status: DC | PRN
  Administered 2024-08-04 – 2024-08-07 (×5): 2.5 mg via RESPIRATORY_TRACT
  Filled 2024-08-04 (×5): qty 3

## 2024-08-04 MED ORDER — ALPRAZOLAM 0.5 MG PO TABS
0.5000 mg | ORAL_TABLET | Freq: Once | ORAL | Status: AC
  Administered 2024-08-04: 0.5 mg via ORAL
  Filled 2024-08-04: qty 1

## 2024-08-04 MED ORDER — BISACODYL 5 MG PO TBEC: 5.0000 mg | DELAYED_RELEASE_TABLET | Freq: Every day | ORAL | Status: DC | PRN

## 2024-08-04 MED ORDER — OSELTAMIVIR PHOSPHATE 75 MG PO CAPS
75.0000 mg | ORAL_CAPSULE | Freq: Two times a day (BID) | ORAL | Status: DC
  Administered 2024-08-04 – 2024-08-08 (×9): 75 mg via ORAL
  Filled 2024-08-04 (×11): qty 1

## 2024-08-04 MED ORDER — PREDNISONE 20 MG PO TABS
40.0000 mg | ORAL_TABLET | Freq: Every day | ORAL | Status: AC
  Administered 2024-08-05 – 2024-08-08 (×4): 40 mg via ORAL
  Filled 2024-08-04 (×4): qty 2

## 2024-08-04 MED ORDER — ONDANSETRON HCL 4 MG PO TABS: 4.0000 mg | ORAL_TABLET | Freq: Four times a day (QID) | ORAL | Status: DC | PRN

## 2024-08-04 MED ORDER — ONDANSETRON HCL 4 MG/2ML IJ SOLN: 4.0000 mg | Freq: Four times a day (QID) | INTRAMUSCULAR | Status: DC | PRN

## 2024-08-04 MED ORDER — BUDESON-GLYCOPYRROL-FORMOTEROL 160-9-4.8 MCG/ACT IN AERO
2.0000 | INHALATION_SPRAY | Freq: Two times a day (BID) | RESPIRATORY_TRACT | Status: DC
  Administered 2024-08-04 – 2024-08-08 (×9): 2 via RESPIRATORY_TRACT
  Filled 2024-08-04 (×2): qty 5.9

## 2024-08-04 MED ORDER — ALBUTEROL SULFATE (2.5 MG/3ML) 0.083% IN NEBU
10.0000 mg/h | INHALATION_SOLUTION | RESPIRATORY_TRACT | Status: AC
  Administered 2024-08-04: 10 mg/h via RESPIRATORY_TRACT
  Filled 2024-08-04: qty 3
  Filled 2024-08-04: qty 12

## 2024-08-04 MED ORDER — SODIUM CHLORIDE 0.9 % IV SOLN
2.0000 g | Freq: Three times a day (TID) | INTRAVENOUS | Status: DC
  Administered 2024-08-04 – 2024-08-08 (×13): 2 g via INTRAVENOUS
  Filled 2024-08-04 (×12): qty 12.5

## 2024-08-04 MED ORDER — IPRATROPIUM-ALBUTEROL 0.5-2.5 (3) MG/3ML IN SOLN
3.0000 mL | Freq: Once | RESPIRATORY_TRACT | Status: AC
  Administered 2024-08-04: 3 mL via RESPIRATORY_TRACT
  Filled 2024-08-04: qty 3

## 2024-08-04 NOTE — Evaluation (Signed)
 Physical Therapy Evaluation Patient Details Name: [name] MRN: [id number] DOB: Dec 14, 1965 Today's Date: 08/04/2024  History of Present Illness  58 yo female presentes 12/2 with SOB. Workup for COPD exacerbation. CT chest with lung nodule. 12/3 positive Flu A. 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 Pt was only able to ambulate short distances before needing a seated rest break due to fatigue/SOB. Pt presents close to mobility baseline requiring supervision to stand and CGA to ambulate with no AD for a total of 226ft. Pt required x2 standing rest breaks due to increasing SOB and SpO2 lowering to 89% on RA. Able to recover with 30 sec-1 min of deep breathing. Pt had one instance of instability, recovering with lateral side step and CGA. Discussed using a RW or rollator for energy conservation and improved stability with pt interested in trialing rollator. Pt will have intermittent assist available upon d/c home. Recommending OP PT at this time, however, pt may progress to having no post-acute therapy needs. Will continue to follow acutely.   96% SpO2 on RA at rest 89-96% SpO2 on RA during gait 88% SpO2 on RA after gait Left on 1L at 96% SpO2  105-120 BPM        If plan is discharge home, recommend the following: Assist for transportation;Help with stairs or ramp for entrance   Can travel by private vehicle    Yes    Equipment Recommendations Other (comment) (plan to trial rollator)     Functional Status Assessment Patient has had a recent decline in their functional status and demonstrates the ability to make significant improvements in function in a reasonable and predictable amount of time.     Precautions / Restrictions Precautions Precautions: Fall Recall of Precautions/Restrictions: Intact Precaution/Restrictions Comments: watch O2 &  HR Restrictions Weight Bearing Restrictions Per Provider Order: No      Mobility  Bed Mobility Overal bed mobility: Modified Independent     Transfers Overall transfer level: Needs assistance Equipment used: None Transfers: Sit to/from Stand Sit to Stand: Supervision      General transfer comment: supervision for safety    Ambulation/Gait Ambulation/Gait assistance: Contact guard assist Gait Distance (Feet): 100 Feet (x100, x60, x40) Assistive device: Rolling walker (2 wheels), None Gait Pattern/deviations: Step-through pattern, Shuffle Gait velocity: decr     General Gait Details: shuffling steps with one instance of instability with no AD. Two standing rest breaks due to SpO2 89%. Able to recover after 30 sec-1 min of deep breathing. Improved gait speed and stability with use of RW     Balance Overall balance assessment: Needs assistance Sitting-balance support: No upper extremity supported, Feet supported Sitting balance-Leahy Scale: Good    Standing balance support: No upper extremity supported Standing balance-Leahy Scale: Fair        Pertinent Vitals/Pain Pain Assessment Pain Assessment: No/denies pain    Home Living Family/patient expects to be discharged to:: Private residence Living Arrangements: Alone Available Help at Discharge: Family;Available PRN/intermittently Type of Home: House Home Access: Stairs to enter Entrance Stairs-Rails: Right;Left;Can reach both Entrance Stairs-Number of Steps: 4   Home Layout: One level Home Equipment: None Additional Comments: Lives on family property with parents on the same property. Dad currently admitted to the hospital    Prior Function Prior Level of Function : Independent/Modified Independent    Mobility Comments: Ind with no AD. Only  able to walk short distances due to SOB/fatigue ADLs Comments: Ind     Extremity/Trunk Assessment   Upper Extremity Assessment Upper Extremity Assessment: Defer to OT  evaluation    Lower Extremity Assessment Lower Extremity Assessment: Overall WFL for tasks assessed    Cervical / Trunk Assessment Cervical / Trunk Assessment: Normal  Communication   Communication Communication: No apparent difficulties    Cognition Arousal: Alert Behavior During Therapy: WFL for tasks assessed/performed   PT - Cognitive impairments: No apparent impairments    Following commands: Intact       Cueing Cueing Techniques: Verbal cues      PT Assessment Patient needs continued PT services  PT Problem List Decreased activity tolerance;Decreased balance;Decreased mobility;Cardiopulmonary status limiting activity       PT Treatment Interventions DME instruction;Gait training;Stair training;Therapeutic activities;Functional mobility training;Therapeutic exercise;Balance training;Neuromuscular re-education;Patient/family education    PT Goals (Current goals can be found in the Care Plan section)  Acute Rehab PT Goals Patient Stated Goal: to feel better and go home PT Goal Formulation: With patient Time For Goal Achievement: 08/18/24 Potential to Achieve Goals: Good    Frequency Min 1X/week     Co-evaluation   Reason for Co-Treatment: For patient/therapist safety;To address functional/ADL transfers PT goals addressed during session: Mobility/safety with mobility;Balance;Proper use of DME         AM-PAC PT 6 Clicks Mobility  Outcome Measure Help needed turning from your back to your side while in a flat bed without using bedrails?: None Help needed moving from lying on your back to sitting on the side of a flat bed without using bedrails?: None Help needed moving to and from a bed to a chair (including a wheelchair)?: A Little Help needed standing up from a chair using your arms (e.g., wheelchair or bedside chair)?: A Little Help needed to walk in hospital room?: A Little Help needed climbing 3-5 steps with a railing? : A Little 6 Click Score:  20    End of Session Equipment Utilized During Treatment: Oxygen Activity Tolerance: Patient tolerated treatment well Patient left: in bed;with call bell/phone within reach Nurse Communication: Mobility status;Other (comment) (vitals) PT Visit Diagnosis: Other abnormalities of gait and mobility (R26.89);Unsteadiness on feet (R26.81)    Time: 9079-9056 PT Time Calculation (min) (ACUTE ONLY): 23 min   Charges:   PT Evaluation $PT Eval Low Complexity: 1 Low   PT General Charges $$ ACUTE PT VISIT: 1 Visit       Kate ORN, PT, DPT Secure Chat Preferred  Rehab Office [phone]   Kate BRAVO Wendolyn 08/04/2024, 11:01 AM

## 2024-08-04 NOTE — ED Notes (Signed)
 RN flushed left upper IV, IV flushed without any blood return, no complaints by patient. RN gave pt solumedrol through IV and pt stated it burned after. RN then flushed line again and area became swollen. Bruising to IV site noted before flushing/giving medication. Pharmacy called at this time. Will continue to monitor.

## 2024-08-04 NOTE — H&P (Signed)
 History and Physical    Patient: [name] FMW:[id number] DOB: December 16, 1965 DOA: 08/03/2024 DOS: the patient was seen and examined on 08/04/2024 PCP: Yolande Toribio MATSU, MD  Patient coming from: Home  Chief Complaint:  Chief Complaint  Patient presents with   Shortness of Breath   Weakness   HPI: [name] is a 58 y.o. 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   She is having difficulty walking and has not been able to sleep because of all the coughing and wheezing. A little over a week ago the patient had congestion, cough increased wheezing and was treated with 7 days of p.o. Levaquin  and 5 days of prednisone  taper.  The patient felt much better by the end of her 7 days, but yesterday she started to feel very short of breath and awful again.  She had a temperature of 103 just 2 days ago and this was after she completed the antibiotics.  She has not had another fever since that time.  She has had substantial wheezing not relieved by her nebulizer treatments at home.  The wheezing did get better while she was on prednisone  but it has come back now with a vengeance. In the emergency department the patient had a CT scan of her chest which revealed increased interstitial markings possibly secondary to fluid overload versus inflammation or infection.  She will be admitted to the hospitalist service for further management.  I did add IV steroids and an neb treatments to her ED course.  Review of Systems: As mentioned in the history of present illness. All other systems reviewed and are negative. Past Medical History:  Diagnosis Date   Acute bilateral low back pain without sciatica    Acute congestive heart failure (HCC) 05/02/2017   EF 20-25% on  ECHO   Acute respiratory failure with hypoxia (HCC) 05/01/2017   Adhesive capsulitis 05/22/2011   Allergic rhinitis    Allergy    Anxiety disorder    Aortic valve regurgitation 05/15/2017   Arm numbness 08/09/2021   Asthma    Chronic bronchitis    Chronic diastolic heart failure (HCC) 12/22/2019   Colon cancer (HCC)    adenocarcinoma in a polyps 11-24-2014   COPD (chronic obstructive pulmonary disease) (HCC)    Cough    Dizziness and giddiness    Dyspepsia    Dyspnea 04/30/2017   Emphysema of lung [facility])    External hemorrhoids    EXTERNAL HEMORRHOIDS 09/27/2009   Female stress incontinence    Genetic testing 08/18/2016   Negative for known pathogenic mutations within any of 25 genes on a Custom Panel through [employer].  One variant of uncertain significance (VUS) called c.7187C>G (p.Thr2396Ser) was found in one copy of the ATM gene.  This Custom Cancer Panel offered by [employer] includes sequencing and/or duplication/deletion testing of the following 25 genes: APC, ATM, AXIN2, BAP1, BMPR1A, BRCA1, BRCA2, CDH1, CDK4, CDKN2A, CHEK2, EPCAM, MITF, MLH1, MSH2, MSH6, MUTYH, PMS2, POLD1, POLE, PTEN, SCG5/GREM1, SMAD4, STK11, and TP53. Date of report is July 23, 2016.  MSH2 Exons 1-7 Inversion Analysis was also negative through [employer].  Date of report is July 23, 2016.      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 NPSG was normal years ago.     Swelling  of right lower extremity 12/01/2014   Past Surgical History:  Procedure Laterality Date   ABDOMINAL HYSTERECTOMY  2001   no BSO   biopsy of right neck mass     negative-thyroid  biopsy    BREAST EXCISIONAL BIOPSY Right 1987   no visible scar   BRONCHOSCOPY  01/31/2005   nonspecific inflammation   BRONCHOSCOPY  08/24/2010   nonspecific inflammation   COLONOSCOPY     COLONOSCOPY WITH PROPOFOL  N/A 02/07/2020   Procedure: COLONOSCOPY WITH PROPOFOL ;  Surgeon: Leigh Elspeth SQUIBB, MD;  Location: [facility];  Service: Gastroenterology;  Laterality: N/A;   CORONARY STENT INTERVENTION N/A 05/12/2017   Procedure: CORONARY STENT INTERVENTION;  Surgeon: Levern Hutching, MD;  Location: [facility];  Service: Cardiovascular;  Laterality: N/A;   LUNG REMOVAL, PARTIAL     for lung cancer--followed by Dr. Brantley and Dr. Neysa   MELANOMA EXCISION WITH SENTINEL LYMPH NODE BIOPSY Right 11/29/2014   Procedure: RIGHT INGUINAL SENTINEL LYMPH NODE BIOPSY;  Surgeon: Dann Hummer, MD;  Location: [facility] OR;  Service: General;  Laterality: Right;   melanoma removal     right calf    POLYPECTOMY     POLYPECTOMY  02/07/2020   Procedure: POLYPECTOMY;  Surgeon: Leigh Elspeth SQUIBB, MD;  Location: [facility];  Service: Gastroenterology;;   portacath removed  10/2007   RIGHT/LEFT HEART CATH AND CORONARY ANGIOGRAPHY N/A 05/06/2017   Procedure: RIGHT/LEFT HEART CATH AND CORONARY ANGIOGRAPHY;  Surgeon: Claudene Pacific, MD;  Location: [facility];  Service: Cardiovascular;  Laterality: N/A;   SPIROMETRY  07/03/2002   min obstruction,mild restriction 01/31/2005   tvh  12/01/2000   hysterectomy   Social History:  reports that she quit smoking about 22 years ago. Her smoking use included cigarettes. She started smoking about 45 years ago. She has a 46.3 pack-year smoking history. She has never used smokeless tobacco. She reports that she does not drink alcohol and does not use drugs.  No Known Allergies  Family History   Problem Relation Age of Onset   Lung cancer Father        smoker and worked at a retail banker   COPD Mother        not a smoker   Other Mother        hx of hysterectomy for unspecified reason   Asthma Daughter    Diabetes Maternal Grandfather    Colon polyps Brother        approx 16 polyps on his first colonoscopy   Other Maternal Aunt        non-cancerous growth in lungs; respiratory issues; smoker   COPD Maternal Grandmother        d. 25   Cancer Paternal Grandfather        oral/mouth cancer, chewed tobacco, dx at older age   Throat cancer Other  maternal great aunt (MGF's sister); not a smoker   Cirrhosis Paternal Uncle        hx of alcohol abuse    Colon cancer Neg Hx    Rectal cancer Neg Hx    Stomach cancer Neg Hx     Prior to Admission medications   Medication Sig Start Date End Date Taking? Authorizing Provider  acetaminophen  (TYLENOL ) 500 MG tablet Take 500 mg by mouth every 8 (eight) hours as needed for moderate pain.    [provider]  albuterol  (VENTOLIN  HFA) 108 (90 Base) MCG/ACT inhaler INHALE 2 PUFFS INTO THE LUNGS EVERY 4 TO 6 HOURS AS NEEDED FOR WHEEZING OR SHORTNESS OF BREATH 04/23/24   Neysa Rama D, MD  ALPRAZolam  (XANAX ) 0.5 MG tablet Take 1 tablet (0.5 mg total) by mouth 2 (two) times daily as needed for up to 1 dose for anxiety. 02/12/18   Madelon Donald HERO, DO  atorvastatin  (LIPITOR) 40 MG tablet Take 1 tablet (40 mg total) by mouth daily. 01/15/24   Walker, Caitlin S, NP  BREZTRI  AEROSPHERE 160-9-4.8 MCG/ACT AERO INHALE 2 PUFFS INTO THE LUNGS IN THE MORNING AND AT BEDTIME 12/05/23   Neysa Rama D, MD  carvedilol  (COREG ) 6.25 MG tablet Take 1 tablet (6.25 mg total) by mouth 2 (two) times daily with a meal. 01/15/24   Vannie Reche RAMAN, NP  clobetasol  ointment (TEMOVATE ) 0.05 % Apply 1 Application topically 2 (two) times daily. 01/19/24   Paci, Karina M, MD  clopidogrel  (PLAVIX ) 75 MG tablet Take 1 tablet (75 mg total) by mouth daily.  01/15/24   Walker, Caitlin S, NP  ezetimibe  (ZETIA ) 10 MG tablet Take 1 tablet (10 mg total) by mouth daily. 01/15/24   Walker, Caitlin S, NP  Fluticasone -Umeclidin-Vilant (TRELEGY ELLIPTA ) 100-62.5-25 MCG/ACT AEPB Inhale 1 puff into the lungs daily. 06/21/24   Neysa Rama D, MD  furosemide  (LASIX ) 20 MG tablet Take 1 tablet (20 mg total) by mouth daily. 01/15/24   Walker, Caitlin S, NP  ipratropium-albuterol  (DUONEB) 0.5-2.5 (3) MG/3ML SOLN USE 1 VIAL VIA NEBULIZER EVERY 6 HOURS AS NEEDED 03/18/23   Neysa Rama D, MD  losartan  (COZAAR ) 25 MG tablet Take 1 tablet (25 mg total) by mouth daily. 01/15/24   Walker, Caitlin S, NP  nitroGLYCERIN  (NITROSTAT ) 0.4 MG SL tablet PLACE 1 TABLET(0.4 MG) UNDER THE TONGUE EVERY 5 MINUTES AS NEEDED FOR CHEST PAIN 07/08/24   Raford Riggs, MD  OXYGEN Inhale 1 L/min into the lungs as needed.    [provider]  pantoprazole (PROTONIX) 40 MG tablet Take 40 mg by mouth daily.  09/15/18   [provider]  potassium chloride  (KLOR-CON ) 10 MEQ tablet Take 1 tablet (10 mEq total) by mouth daily. 01/15/24   Walker, Caitlin S, NP  spironolactone  (ALDACTONE ) 25 MG tablet Take 1 tablet (25 mg total) by mouth daily. 01/15/24   Walker, Caitlin S, NP  triamcinolone cream (KENALOG) 0.1 % Apply topically 2 (two) times daily. 04/06/21   [provider]  zolpidem  (AMBIEN ) 5 MG tablet TAKE 1 TABLET BY MOUTH AT BEDTIME AS NEEDED FOR SLEEP 07/27/24   Neysa Rama D, MD    Physical Exam: Vitals:   08/03/24 1719 08/03/24 1720 08/03/24 1901 08/03/24 2127  BP:   113/66   Pulse:   (!) 109   Resp:   (!) 21   Temp:   98.6 F (37 C) 98.9 F (37.2 C)  TempSrc:   Oral Oral  SpO2:  91% 91%  Weight: 81.6 kg     Height: 5' 5 (1.651 m)      Physical Exam:  General: No acute distress, well developed, well nourished, frequent cough HEENT: Normocephalic, atraumatic, PERRL Cardiovascular: Normal rate and rhythm. Distal pulses intact. Pulmonary: Diffuse,  moderate expiratory wheezing Gastrointestinal: Nondistended abdomen, soft, tender in right abdomen ( muscles sore from coughing) , normoactive bowel sounds Musculoskeletal:Normal ROM, trace edema of hands and lower extremities Lymphadenopathy: No cervical LAD. Skin: Skin is warm and dry. Neuro: No focal deficits noted, AAOx3. PSYCH: Attentive and cooperative  Data Reviewed:  Results for orders placed or performed during the hospital encounter of 08/03/24 (from the past 24 hours)  Comprehensive metabolic panel     Status: Abnormal   Collection Time: 08/03/24  6:33 PM  Result Value Ref Range   Sodium 134 (L) 135 - 145 mmol/L   Potassium 4.5 3.5 - 5.1 mmol/L   Chloride 100 98 - 111 mmol/L   CO2 22 22 - 32 mmol/L   Glucose, Bld 104 (H) 70 - 99 mg/dL   BUN 8 6 - 20 mg/dL   Creatinine, Ser 9.03 0.44 - 1.00 mg/dL   Calcium  8.3 (L) 8.9 - 10.3 mg/dL   Total Protein 7.1 6.5 - 8.1 g/dL   Albumin 3.4 (L) 3.5 - 5.0 g/dL   AST 23 15 - 41 U/L   ALT 14 0 - 44 U/L   Alkaline Phosphatase 78 38 - 126 U/L   Total Bilirubin 1.4 (H) 0.0 - 1.2 mg/dL   GFR, Estimated >39 >39 mL/min   Anion gap 12 5 - 15  CBC with Differential     Status: Abnormal   Collection Time: 08/03/24  6:33 PM  Result Value Ref Range   WBC 13.4 (H) 4.0 - 10.5 K/uL   RBC 4.73 3.87 - 5.11 MIL/uL   Hemoglobin 12.4 12.0 - 15.0 g/dL   HCT 60.2 63.9 - 53.9 %   MCV 83.9 80.0 - 100.0 fL   MCH 26.2 26.0 - 34.0 pg   MCHC 31.2 30.0 - 36.0 g/dL   RDW 85.5 88.4 - 84.4 %   Platelets 222 150 - 400 K/uL   nRBC 0.0 0.0 - 0.2 %   Neutrophils Relative % 84 %   Neutro Abs 11.3 (H) 1.7 - 7.7 K/uL   Lymphocytes Relative 8 %   Lymphs Abs 1.0 0.7 - 4.0 K/uL   Monocytes Relative 7 %   Monocytes Absolute 1.0 0.1 - 1.0 K/uL   Eosinophils Relative 0 %   Eosinophils Absolute 0.0 0.0 - 0.5 K/uL   Basophils Relative 0 %   Basophils Absolute 0.0 0.0 - 0.1 K/uL   Immature Granulocytes 1 %   Abs Immature Granulocytes 0.08 (H) 0.00 - 0.07 K/uL   Protime-INR     Status: Abnormal   Collection Time: 08/03/24  6:33 PM  Result Value Ref Range   Prothrombin Time 15.3 (H) 11.4 - 15.2 seconds   INR 1.1 0.8 - 1.2  Urinalysis, w/ Reflex to Culture (Infection Suspected) -Urine, Clean Catch     Status: None   Collection Time: 08/03/24  6:33 PM  Result Value Ref Range   Specimen Source URINE, CLEAN CATCH    Color, Urine YELLOW YELLOW   APPearance CLEAR CLEAR   Specific Gravity, Urine 1.009 1.005 - 1.030   pH 8.0 5.0 - 8.0   Glucose, UA NEGATIVE NEGATIVE mg/dL   Hgb urine dipstick NEGATIVE NEGATIVE   Bilirubin Urine NEGATIVE NEGATIVE   Ketones,  ur NEGATIVE NEGATIVE mg/dL   Protein, ur NEGATIVE NEGATIVE mg/dL   Nitrite NEGATIVE NEGATIVE   Leukocytes,Ua NEGATIVE NEGATIVE   RBC / HPF 0-5 0 - 5 RBC/hpf   WBC, UA 0-5 0 - 5 WBC/hpf   Bacteria, UA NONE SEEN NONE SEEN   Squamous Epithelial / HPF 0-5 0 - 5 /HPF  I-Stat Lactic Acid, ED     Status: None   Collection Time: 08/03/24  6:53 PM  Result Value Ref Range   Lactic Acid, Venous 1.8 0.5 - 1.9 mmol/L      CTA Chest IMPRESSION: 1. No pulmonary embolism. 2. Enlarged central pulmonary arteries consistent with pulmonary arterial hypertension. 3. Progression of superimposed diffuse ground-glass pulmonary infiltrate since the prior examination, possibly representing edema versus progressive chronic inflammatory infiltrate. 4. 5 mm left upper lobe pulmonary nodule. Per Fleischner Society Guidelines a non-contrast Chest CT at 12 months is optional. If performed and the nodule is stable at 12 months, no further follow-up is recommended.    Assessment and Plan: COPD Exacerbation/ possible pneumonia/ known radiation fibrosis - IV steroids, nebs, increased O2 as needed, and antibiotics.  Since she already took a week of Levaquin , will start Maxipime.  Since she already took the Levaquin  will not add atypical coverage - COVID, flu, RSV test are pending - She is currently requiring 3 L O2  nasal cannula over her baseline 1 L nasal cannula. - Consider pulmonary consultation.  She follows with Dr. Neysa at baseline  2. lung nodule - repeat CT in 12 months is recommended but given her history of lung cancer I feel this may need closer monitoring.  3.  CAD with history of stent - continue Plavix   4.  Leukocytosis - this may be from previous prednisone  use.    Advance Care Planning:   Code Status: Prior  The patient names her mother as her surrogate decision maker and she wants to be full code. Consults: None  family Communication: None  Severity of Illness: The appropriate patient status for this patient is INPATIENT. Inpatient status is judged to be reasonable and necessary in order to provide the required intensity of service to ensure the patient's safety. The patient's presenting symptoms, physical exam findings, and initial radiographic and laboratory data in the context of their chronic comorbidities is felt to place them at high risk for further clinical deterioration. Furthermore, it is not anticipated that the patient will be medically stable for discharge from the hospital within 2 midnights of admission.   * I certify that at the point of admission it is my clinical judgment that the patient will require inpatient hospital care spanning beyond 2 midnights from the point of admission due to high intensity of service, high risk for further deterioration and high frequency of surveillance required.*  Author: ARTHEA CHILD, MD 08/04/2024 1:01 AM  For on call review [email or web address].

## 2024-08-04 NOTE — Progress Notes (Signed)
 Courtesy visit- No billing:  Patient is seen and examined today morning when emergency department room 8.  She is admitted for COPD exacerbation, found to have influenza A positive.  States she feels better, currently on 3 L supplemental oxygen.  Cough improved.  Eating fair.  Getting out of bed.  Plan to continue IV steroids, supplemental oxygen, DuoNebs and IV antibiotics.  Patient will be started on Tamiflu  therapy per pharmacy protocol.  I advised her incentive spirometry, out of bed to chair.  Further management pending clinical course.

## 2024-08-04 NOTE — Evaluation (Signed)
 Occupational Therapy Evaluation Patient Details Name: [name] MRN: [id number] DOB: 10-29-1965 Today's Date: 08/04/2024   History of Present Illness   58 yo female presentes 12/2 with SOB. Workup for COPD exacerbation. CT chest with lung nodule. 12/3 positive Flu A. PMHx: R lobectomy 2/2 Lung CA, radiation fibrosis, langergans cell histiocytoiss and meanoma of R calf     Clinical Impressions Pt lives alone and is typically independent. She bathes down in her tub. She has found she can get through large stores if she leans on the cart. Pt reports using 1L O2 as needed during the day and at night. Presents with mild unsteadiness with ambulation requiring supervision. She is overall functioning at a set up to supervision level in ADLs. SpO2 noted to drop to 88% with activity, rebounded to 96% on RA with standing rest breaks. Left pt on 1L O2 at end of session with SpO2 of 96%. Will follow to educate in energy conservation and breathing techniques and increasing activity tolerance. Do not anticipate need for post acute OT.     If plan is discharge home, recommend the following:   Assistance with cooking/housework     Functional Status Assessment   Patient has had a recent decline in their functional status and demonstrates the ability to make significant improvements in function in a reasonable and predictable amount of time.     Equipment Recommendations    (rollator)     Recommendations for Other Services         Precautions/Restrictions   Precautions Precautions: Fall Recall of Precautions/Restrictions: Intact Precaution/Restrictions Comments: watch O2 & HR Restrictions Weight Bearing Restrictions Per Provider Order: No     Mobility Bed Mobility Overal bed mobility: Modified Independent                  Transfers Overall transfer level: Needs assistance Equipment used: None Transfers: Sit to/from Stand Sit to Stand: Supervision                   Balance Overall balance assessment: Needs assistance   Sitting balance-Leahy Scale: Good     Standing balance support: No upper extremity supported Standing balance-Leahy Scale: Fair                             ADL either performed or assessed with clinical judgement   ADL Overall ADL's : Needs assistance/impaired Eating/Feeding: Independent   Grooming: Standing;Supervision/safety   Upper Body Bathing: Supervision/ safety;Standing   Lower Body Bathing: Supervison/ safety;Sit to/from stand   Upper Body Dressing : Set up;Sitting   Lower Body Dressing: Set up;Sitting/lateral leans   Toilet Transfer: Supervision/safety;Ambulation   Toileting- Clothing Manipulation and Hygiene: Supervision/safety;Sit to/from stand       Functional mobility during ADLs: Supervision/safety       Vision Baseline Vision/History: 0 No visual deficits Ability to See in Adequate Light: 0 Adequate Patient Visual Report: No change from baseline       Perception         Praxis         Pertinent Vitals/Pain Pain Assessment Pain Assessment: No/denies pain     Extremity/Trunk Assessment Upper Extremity Assessment Upper Extremity Assessment: Overall WFL for tasks assessed;Right hand dominant   Lower Extremity Assessment Lower Extremity Assessment: Defer to PT evaluation   Cervical / Trunk Assessment Cervical / Trunk Assessment: Normal   Communication Communication Communication: No apparent difficulties   Cognition Arousal: Alert Behavior During  Therapy: WFL for tasks assessed/performed Cognition: No apparent impairments                               Following commands: Intact       Cueing  General Comments   Cueing Techniques: Verbal cues      Exercises     Shoulder Instructions      Home Living Family/patient expects to be discharged to:: Private residence Living Arrangements: Alone Available Help at Discharge:  Family;Available PRN/intermittently Type of Home: House Home Access: Stairs to enter [employer] of Steps: 4 Entrance Stairs-Rails: Right;Left;Can reach both Home Layout: One level     Bathroom Shower/Tub: Chief Strategy Officer: Standard     Home Equipment: None   Additional Comments: Lives on family property with parents on the same property. Dad currently admitted to the hospital      Prior Functioning/Environment Prior Level of Function : Independent/Modified Independent             Mobility Comments: Ind with no AD. Only able to walk short distances due to SOB/fatigue ADLs Comments: Ind, leans on grocery cart to get through [employer]    OT Problem List: Decreased activity tolerance;Impaired balance (sitting and/or standing);Decreased knowledge of use of DME or AE;Cardiopulmonary status limiting activity   OT Treatment/Interventions: Self-care/ADL training;Patient/family education;DME and/or AE instruction;Energy conservation      OT Goals(Current goals can be found in the care plan section)   Acute Rehab OT Goals OT Goal Formulation: With patient Time For Goal Achievement: 08/18/24 Potential to Achieve Goals: Good ADL Goals Additional ADL Goal #1: Pt will complete basice ADLs mod I. Additional ADL Goal #2: Pt will generalize energy conservation and breathing strategies in ADLs and mobility.   OT Frequency:  Min 2X/week    Co-evaluation   Reason for Co-Treatment: For patient/therapist safety;To address functional/ADL transfers PT goals addressed during session: Mobility/safety with mobility;Balance;Proper use of DME        AM-PAC OT 6 Clicks Daily Activity     Outcome Measure Help from another person eating meals?: None Help from another person taking care of personal grooming?: A Little Help from another person toileting, which includes using toliet, bedpan, or urinal?: A Little Help from another person bathing (including  washing, rinsing, drying)?: A Little Help from another person to put on and taking off regular upper body clothing?: A Little Help from another person to put on and taking off regular lower body clothing?: A Little 6 Click Score: 19   End of Session Equipment Utilized During Treatment: Rolling walker (2 wheels);Gait belt Nurse Communication: Mobility status  Activity Tolerance: Patient tolerated treatment well Patient left: in bed;with call bell/phone within reach  OT Visit Diagnosis: Unsteadiness on feet (R26.81);Other (comment) (decreased activity tolerance)                Time: 9079-9053 OT Time Calculation (min): 26 min Charges:  OT General Charges $OT Visit: 1 Visit OT Evaluation $OT Eval Low Complexity: 1 Low  Mliss HERO, OTR/L Acute Rehabilitation Services Office: [phone]   Kennth Mliss Helling 08/04/2024, 11:38 AM

## 2024-08-04 NOTE — ED Notes (Signed)
 Resp tech at bedside for neb

## 2024-08-04 NOTE — Plan of Care (Signed)
  Problem: Activity: Goal: Ability to tolerate increased activity will improve Outcome: Progressing   Problem: Respiratory: Goal: Ability to maintain a clear airway will improve Outcome: Progressing Goal: Levels of oxygenation will improve Outcome: Progressing Goal: Ability to maintain adequate ventilation will improve Outcome: Progressing   

## 2024-08-04 NOTE — ED Notes (Signed)
 NT assisted pt with using bedside commode and helping pt clean self up

## 2024-08-05 DIAGNOSIS — J9621 Acute and chronic respiratory failure with hypoxia: Secondary | ICD-10-CM | POA: Diagnosis not present

## 2024-08-05 DIAGNOSIS — C3491 Malignant neoplasm of unspecified part of right bronchus or lung: Secondary | ICD-10-CM | POA: Diagnosis not present

## 2024-08-05 DIAGNOSIS — J101 Influenza due to other identified influenza virus with other respiratory manifestations: Secondary | ICD-10-CM

## 2024-08-05 DIAGNOSIS — I5042 Chronic combined systolic (congestive) and diastolic (congestive) heart failure: Secondary | ICD-10-CM

## 2024-08-05 DIAGNOSIS — C4371 Malignant melanoma of right lower limb, including hip: Secondary | ICD-10-CM

## 2024-08-05 DIAGNOSIS — J441 Chronic obstructive pulmonary disease with (acute) exacerbation: Secondary | ICD-10-CM | POA: Diagnosis not present

## 2024-08-05 LAB — HEMOGLOBIN A1C
Hgb A1c MFr Bld: 5.4 % (ref 4.8–5.6)
Mean Plasma Glucose: 108.28 mg/dL

## 2024-08-05 MED ORDER — ENSURE PLUS HIGH PROTEIN PO LIQD
237.0000 mL | Freq: Two times a day (BID) | ORAL | Status: DC
  Administered 2024-08-05 – 2024-08-06 (×3): 237 mL via ORAL

## 2024-08-05 MED ORDER — ATORVASTATIN CALCIUM 40 MG PO TABS
40.0000 mg | ORAL_TABLET | Freq: Every day | ORAL | Status: DC
  Administered 2024-08-05 – 2024-08-08 (×4): 40 mg via ORAL
  Filled 2024-08-05 (×4): qty 1

## 2024-08-05 MED ORDER — FUROSEMIDE 20 MG PO TABS
20.0000 mg | ORAL_TABLET | Freq: Every day | ORAL | Status: DC
  Administered 2024-08-06 – 2024-08-07 (×2): 20 mg via ORAL
  Filled 2024-08-05 (×2): qty 1

## 2024-08-05 MED ORDER — ZOLPIDEM TARTRATE 5 MG PO TABS
5.0000 mg | ORAL_TABLET | Freq: Every evening | ORAL | Status: DC | PRN
  Administered 2024-08-07: 5 mg via ORAL
  Filled 2024-08-05: qty 1

## 2024-08-05 MED ORDER — ALPRAZOLAM 0.5 MG PO TABS
0.5000 mg | ORAL_TABLET | Freq: Two times a day (BID) | ORAL | Status: DC | PRN
  Administered 2024-08-05 – 2024-08-06 (×3): 0.5 mg via ORAL
  Filled 2024-08-05 (×3): qty 1

## 2024-08-05 MED ORDER — CARVEDILOL 6.25 MG PO TABS
6.2500 mg | ORAL_TABLET | Freq: Two times a day (BID) | ORAL | Status: DC
  Administered 2024-08-05 – 2024-08-08 (×6): 6.25 mg via ORAL
  Filled 2024-08-05 (×6): qty 1

## 2024-08-05 NOTE — Discharge Instructions (Addendum)
 Chronic Obstructive Pulmonary Disease (COPD) Nutrition Therapy  Symptoms of COPD such as cough, shortness of breath, and fatigue can make it more difficult for you to eat enough. It is important that you conserve your energy and make sure you have enough calories and protein in your diet. This nutrition therapy for COPD may help you improve your food choices and meal patterns to help meet your nutrition goals.  Tips Mealtime strategies Eat when you are hungry. Experiment with timing meals to find out when you have a larger appetite. Eat small meals and snacks 5-6 times per day. Try to eat even when you are not feeling hungry. Reduce activity around mealtimes to preserve energy levels. Eat slowly. Select foods that are easy to chew and swallow and be sure to chew your foods well. Limit carbonated beverages like soda and seltzer to help reduce symptoms of bloating and fullness. Let others (family, friends, neighbors) help, including with shopping, preparation, and clean up. Use supplemental oxygen around mealtimes if prescribed. Use good posture while eating to make it easier to eat and breathe. Choose Foods and Beverages High in Nutrients and Calories Replace light or diet foods and beverages and low-calorie beverages and broths with nutrient-dense choices. These light and diet foods and liquids don't provide much benefit. Choose a variety of easy-to-prepare and easy-to-eat fruits, vegetables, and whole grains to meet your vitamin, mineral, and fiber needs. Drink beverages high in calories and nutrients such as full fat dairy products and oral nutrition supplements. Choose a variety of high-calorie and high-protein foods that you enjoy such as nuts, soy nuts, nut butter, seed butter, cheese, fish, poultry, tofu, and tempeh and prepare with oil or sauces. Take medical food supplements as recommended. Stay Hydrated Keep a water bottle with you at all times to help you meet your  fluid goals. Drink fluids, including water, throughout the day and evening.    Foods to Choose Food Group Foods to Choose  Grains Breads, tortillas, crackers, pasta Rice, quinoa, barley, corn Breakfast cereals such as cold cereals, oatmeal, and grits  Protein Foods Meats--beef or pork Chicken and turkey - with or without skin, light meat or dark meat Fish, especially fatty fish like tuna, mackerel, and salmon Nuts, nut butters Eggs Beans, peas, and lentils Soy products such as tofu  Dairy and Dairy Alternatives Milkshakes, whole milk, fortified milk (powdered nonfat milk added to fluid milk) Fortified non-dairy milk (almond, rice, soy, etc.) Cheese Full-fat yogurt or Greek yogurt Cream cheese Sour cream  Vegetables All fresh, frozen, or canned vegetables.  Fruit All fresh, frozen, or canned fruit.  Fats and Oils Vegetable oils (canola, olive, etc.) Butter or margarine, Salad dressings   Chronic Obstructive Pulmonary Disease (COPD) Sample 1-Day Menu View Nutrient Info Breakfast 2 egg omelet made with:  cup bell pepper, chopped 1 tablespoon onion, chopped  cup tomato, chopped 2 slices whole wheat toast 1 tablespoon butter  cup orange juice fortified with calcium   Lunch 3 ounces tuna, canned 2 tablespoons mayonnaise 7 whole wheat crackers  cup canned pear halves  Afternoon Snack Smoothie made with: 1 banana  cup frozen strawberries 2 tablespoons peanut butter  cup whole milk yogurt  cup whole milk  Evening Meal 3 ounces ground beef patty 1 whole wheat hamburger bun 1 tablespoon ketchup  cup broccoli, cooked 1 baked potato 2 tablespoons sour cream 1 tablespoon cheddar cheese  Evening Snack  cup ice cream  cup peach halves  Daily Sum Nutrient Unit Value  Macronutrients  Energy kcal 2468  Energy kJ 10328  Protein g 99  Total lipid (fat) g 110  Carbohydrate, by difference g 284  Fiber, total dietary g 30  Sugars, total g 140  Minerals  Calcium , Ca  mg 1740  Iron, Fe mg 15  Sodium, Na mg 1766  Vitamins  Vitamin C, total ascorbic acid mg 351  Vitamin A, IU IU 4638  Vitamin D IU 198  Lipids  Fatty acids, total saturated g 38  Fatty acids, total monounsaturated g 35  Fatty acids, total polyunsaturated g 26  Cholesterol mg 546     Chronic Obstructive Pulmonary Disease (COPD) Vegan Sample 1-Day Menu View Nutrient Info Breakfast 1/3 cup tofu scramble 1 tablespoon margarine, soft, tub 2 slices whole wheat toast  cup orange juice  Lunch  cup hummus 8 whole wheat crackers 2 peach halves, canned 4 walnut halves 1 cup soymilk fortified with calcium , vitamin B12, and vitamin D  Afternoon Snack Smoothie made with: 1 banana  cup frozen strawberries 1 scoop soy protein powder  cup apple juice  Evening Meal 1 cup meatless meatballs  cup tomato sauce 1 baked potato 1 tablespoon margarine, soft, tub  cup broccoli 1 whole wheat dinner roll  Evening Snack 6 ounces soy yogurt  Daily Sum Nutrient Unit Value  Macronutrients  Energy kcal 2151  Energy kJ 9003  Protein g 106  Total lipid (fat) g 70  Carbohydrate, by difference g 292  Fiber, total dietary g 42  Sugars, total g 120  Minerals  Calcium , Ca mg 1307  Iron, Fe mg 21  Sodium, Na mg 2530  Vitamins  Vitamin C, total ascorbic acid mg 260  Vitamin A, IU IU 3976  Vitamin D IU 194  Lipids  Fatty acids, total saturated g 12  Fatty acids, total monounsaturated g 21  Fatty acids, total polyunsaturated g 31  Cholesterol mg 0     Chronic Obstructive Pulmonary Disease (COPD) Vegetarian (Lacto-Ovo) Sample 1-Day Menu View Nutrient Info Breakfast 2 scrambled eggs 1 tablespoon butter 2 slices whole wheat toast  cup orange juice  Lunch  cup hummus 8 whole wheat crackers 2 tablespoons cottage cheese 2 peach halves, canned 4 walnut halves 1 cup whole milk  Afternoon Snack Smoothie made with: 1 banana  cup frozen strawberries  cup fat-free dry milk  cup apple  juice  Evening Meal 1 veggie burger 1 whole wheat hamburger bun 1 tablespoon mayonnaise 1 baked potato 1 tablespoon butter  cup broccoli 1 tablespoon shredded cheese  Evening Snack 6 ounces yogurt  Daily Sum Nutrient Unit Value  Macronutrients  Energy kcal 2300  Energy kJ 9619  Protein g 90  Total lipid (fat) g 88  Carbohydrate, by difference g 304  Fiber, total dietary g 37  Sugars, total g 139  Minerals  Calcium , Ca mg 1738  Iron, Fe mg 15  Sodium, Na mg 2431  Vitamins  Vitamin C, total ascorbic acid mg 233  Vitamin A, IU IU 4577  Vitamin D IU 466  Lipids  Fatty acids, total saturated g 31  Fatty acids, total monounsaturated g 25  Fatty acids, total polyunsaturated g 24  Cholesterol mg 282     Pulmonary Disease Sample 1-Day Menu View Nutrient Info Breakfast 2 scrambled eggs 1 tablespoon butter 2 slices whole wheat toast 6 oz orange juice  Lunch 1/2 cup tuna salad 2 canned peach halves 2 tablespoons cottage cheese 4 walnut halves  Afternoon  Snack 1/2 cup apple juice, for smoothie 1 banana, for smoothie 1/2 cup frozen strawberries, for smoothie 1/4 cup fat-free dry milk, for smoothie  Evening Meal 3 oz ground beef patty 1/4 cup gravy 1 baked potato 1 tablespoon butter 1/2 cup broccoli 1 tablespoon melted cheese, for broccoli 2 slices whole wheat bread  Evening Snack 1/2 cup ice cream  Daily Sum Nutrient Unit Value  Macronutrients  Energy kcal 2644  Energy kJ 11060  Protein g 136  Total lipid (fat) g 109  Carbohydrate, by difference g 287  Fiber, total dietary g 23  Sugars, total g 99  Minerals  Calcium , Ca mg 1533  Iron, Fe mg 13  Sodium, Na mg 4912  Vitamins  Vitamin C, total ascorbic acid mg 189  Vitamin A, IU IU 5134  Vitamin D IU 191  Lipids  Fatty acids, total saturated g 50  Fatty acids, total monounsaturated g 34  Fatty acids, total polyunsaturated g 16  Cholesterol mg 637    Copyright 2020  Academy of Nutrition and Dietetics.  All rights reserved     High Protein Foods List  Your protein needs may increase or decrease if you have certain medical conditions. Your registered dietitian nutritionist will help you figure out how much protein you need.  Your protein goal is 70-90 grams per day.   Foods High in Protein (5 grams or more) Food Group Food Serving Amount (grams)  Grains Pancakes, high protein  cup mix, dry 6-14   Pasta, high protein  cup cooked 7-13   Quinoa, farro, teff, brown rice, or whole wheat pasta 1 cup 6-8  Protein Foods Beans, peas, or lentils  cup 7   Bone broth 1 cup 9-13   Egg or egg substitute 1 egg or  cup egg substitute 7   Meats, poultry, fish, or seafood 1 ounce 7   Nuts  cup 7   Nut butters 2 tablespoons 6-8   Seeds 2 tablespoons 7   Soybeans (shelled edamame)  cup 7   Tofu  cup 9-11   Veggie burger 1 patty 11  Dairy and Dairy Alternatives Cheese 1 ounce 7   Cottage cheese  cup 7   Milk 1 cup 8   Pea milk 1 cup 8   Soymilk 1 cup 7   Yogurt or kefir  cup 8   Yogurt, Greek  cup 13-16

## 2024-08-05 NOTE — Progress Notes (Signed)
 Occupational Therapy Treatment Patient Details Name: [name] MRN: [id number] DOB: 1965/11/23 Today's Date: 08/05/2024   History of present illness 58 yo female presentes 12/2 with SOB. Workup for COPD exacerbation. CT chest with lung nodule. 12/3 positive Flu A. 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 Pt verbalizing and demonstrating understanding of education through teach back, but will benefit from reinforcement of use of energy conservation strategies during functional tasks. Pt currently demonstrating ability to complete ADLs Independent to Supervision for safety. Pt presenting this session with signs and reports of feeling anxious. OT also educated pt in strategies for improve management of acute and chronic signs/feelings of anxiety, including breathing techniques, sensory-based strategies, and regular ongoing participation in hobbies/preferred leisure activities with pt verbalizing and demonstrating understanding of training through teach back. Pt HR in to low-100s to low-120s and O2 sat O2 sat dropping to 88% on 3L continuous O2 with minimal activity. Pt requiring seated rest breaks and occasional use of PLB during session to maintain O2 sat of 90% to 93% on 3L. Pt participated well in session and is making progress toward OT goals. Acute OT to continue to follow. No post-acute skilled OT needs are anticipated at this time.       If plan is discharge home, recommend the following:  Assistance with cooking/housework;Assist for transportation   Equipment Recommendations  Other (comment) Aeronautical Engineer)    Recommendations for Other Services      Precautions / Restrictions Precautions Precautions: Fall;Other (comment) Recall of  Precautions/Restrictions: Intact Precaution/Restrictions Comments: watch O2 & HR Restrictions Weight Bearing Restrictions Per Provider Order: No       Mobility Bed Mobility Overal bed mobility: Modified Independent                  Transfers Overall transfer level: Needs assistance Equipment used: None Transfers: Sit to/from Stand Sit to Stand: Supervision                 Balance Overall balance assessment: Needs assistance Sitting-balance support: No upper extremity supported, Feet supported Sitting balance-Leahy Scale: Good     Standing balance support: No upper extremity supported, During functional activity Standing balance-Leahy Scale: Fair                             ADL either performed or assessed with clinical judgement   ADL Overall ADL's : Needs assistance/impaired Eating/Feeding: Independent;Sitting   Grooming: Supervision/safety;Standing   Upper Body Bathing: Supervision/ safety;Sitting   Lower Body Bathing: Supervison/ safety;Sit to/from stand   Upper Body Dressing : Set up;Sitting   Lower Body Dressing: Set up;Sitting/lateral leans;Sit to/from stand   Toilet Transfer: Supervision/safety;Ambulation Toilet Transfer Details (indicate cue type and reason): simulated at EOB Toileting- Clothing Manipulation and Hygiene: Supervision/safety;Sit to/from stand         General ADL Comments: OT educated pt in use of energy conservation strategies with handout provided with pt verbalizing and demonstrating understanding of education through teach back. Pt will benefit from reinforcement of use of energy conservation strateiges during funcitonal tasks. Pt with decreased activity tolerance, fatiguing quickly during tasks and requiring frequent rest breaks. Pt also with decreased O2 sat to 88% on 3L continuous O2 with minimal activity this session with pt  requiring seated rest break and use of PLB to recover to >/90% on 3L.     Extremity/Trunk Assessment Upper Extremity Assessment Upper Extremity Assessment: Right hand dominant;Overall [facility] for tasks assessed   Lower Extremity Assessment Lower Extremity Assessment: Defer to PT evaluation        Vision   Additional Comments: Vision [facility] for tasks assessed; not formally screened or evaluated   Perception     Praxis     Communication Communication Communication: No apparent difficulties   Cognition Arousal: Alert Behavior During Therapy: WFL for tasks assessed/performed, Anxious Cognition: No apparent impairments             OT - Cognition Comments: AAOx4 with congnition WFL for tasks assessed. Pt with signs of and pt reporting feelings of anxiety related to discharging home with increased supplemental O2 needs, paying for hospital stay co-pay, and locating shoes that were left in ED. OT offered supportive listening and answered pt general questions regarding who to speak with in regards to billing. OT also educated pt in strategies for improved management of acute and chonic signs/feeling of anxiety, including breathing techniques, sensory-based strategies, and making time for preferred hobbies/leisure activities. Pt verbalized and demonstrated understanding of all training through teach back.                 Following commands: Intact        Cueing   Cueing Techniques: Verbal cues  Exercises      Shoulder Instructions       General Comments Pt HR in the low-100s to low-120s and O2 sat dropping to 88% on 3L continuous O2 with minimal activity. Pt requiring seated rest breaks and occasional use of PLB during session to maintain O2 sat of 90% to 93% on 3L.    Pertinent Vitals/ Pain       Pain Assessment Pain Assessment: No/denies pain  Home Living                                          Prior Functioning/Environment              Frequency  Min 2X/week        Progress Toward Goals  OT Goals(current  goals can now be found in the care plan section)  Progress towards OT goals: Progressing toward goals     Plan      Co-evaluation                 AM-PAC OT 6 Clicks Daily Activity     Outcome Measure   Help from another person eating meals?: None Help from another person taking care of personal grooming?: A Little Help from another person toileting, which includes using toliet, bedpan, or urinal?: A Little Help from another person bathing (including washing, rinsing, drying)?: A Little Help from another person to put on and taking off regular upper body clothing?: A Little Help from another person to put on and taking off regular lower body clothing?: A Little 6 Click Score: 19    End of Session Equipment Utilized During Treatment: Oxygen (3L continuous through nasal cannula)  OT Visit Diagnosis: Other (comment);Unsteadiness on feet (R26.81) (decreased activity tolerance)   Activity Tolerance Patient tolerated treatment well;Treatment limited secondary to medical complications (Comment) (Pt limited by impaired cardiopulmonary status)   Patient Left in bed;with call bell/phone within reach   Nurse  Communication Mobility status;Other (comment) (Pt with signs of and reporting feelings of anxiety. Pt with questions/concerns regarding discharge pain and leaving her shoes in the ED.)        Time: 8486-8441 OT Time Calculation (min): 45 min  Charges: OT General Charges $OT Visit: 1 Visit OT Treatments $Self Care/Home Management : 38-52 mins  Margarie Rockey HERO., OTR/L, MA Acute Rehab [phone]   Margarie FORBES Horns 08/05/2024, 5:19 PM

## 2024-08-05 NOTE — Progress Notes (Signed)
   08/05/24 0808  Vitals  Temp 98.5 F (36.9 C)  BP 99/66  MAP (mmHg) 78  BP Location Right Arm  BP Method Automatic  Patient Position (if appropriate) Lying  Pulse Rate (!) 116  Pulse Rate Source Monitor  Resp 19  MEWS COLOR  MEWS Score Color Yellow  Oxygen Therapy  SpO2 91 %  O2 Device Nasal Cannula  MEWS Score  MEWS Temp 0  MEWS Systolic 1  MEWS Pulse 2  MEWS RR 0  MEWS LOC 0  MEWS Score 3

## 2024-08-05 NOTE — Progress Notes (Signed)
 Initial Nutrition Assessment  DOCUMENTATION CODES:   Not applicable  INTERVENTION:  Encouraged incorporating more protein at all meals Added list from Academy of Nutrition and Dietetics to AVS Added COPD Nutrition Therapy handout from the Academy of Nutrition and Dietetics to AVS  Encouraged small, frequent meals to prevent early satiety  Ensure Plus High Protein po BID, each supplement provides 350 kcal and 20 grams of protein.  NUTRITION DIAGNOSIS:   Inadequate protein intake related to early satiety as evidenced by per patient/family report.  GOAL:   Patient will meet greater than or equal to 90% of their needs  MONITOR:   PO intake, Supplement acceptance  REASON FOR ASSESSMENT:   Consult Assessment of nutrition requirement/status  ASSESSMENT:   Pt with hx of lung cancer (s/p lobectomy), radiation fibrosis, COPD, nocturnal hypoxia, melanoma of R calf, and Langerhans' cell histiocytosis. Admitted with SOB and weakness, diagnosed COPD exacerbation and positive for influenza A.  Spoke with pt who was resting in bed at time of assessment. Pt reports she is still not feeling well and does not feel like symptoms have improved. Pt does endorse a good appetite, eating on average 90% of meals. Pt reports no GI discomforts at this time.   PTA, pt reports poor appetite leading up to admission, states she was only able to tolerate broth and water for 1 week. Prior to acute illness, pt reports she was eating 1 large meal per day and then snacking throughout the day. Pt's large meal would be around lunch time and consisted of some type of soup, dumplings, or pizza. For snacks, pt will eat crackers, chips, or yogurt flip cups. Pt drinks mostly water and dr. Nunzio. Pt reports she only eats some meat in her soups but usually does not eat the meat. Pt states she does not like the taste of most meats and feels like meat makes her feel full quickly. Pt also reports disliking all vegetables and  states she only eats them when they are in soup due to flavor. Pt reports she does not like feeling full because it puts pressure on diaphragm and makes it difficult for her to breath. From pt's recall, appears pt does not eat enough protein to support muscle maintenance. Physical exam also shows some mild depletions of muscle. Discussed the importance of protein intake in a general healthy diet. Encouraged pt to try Ensure shake to help with protein intake. Discussed importance of fiber and micronutrient intake, encouraged trying to incorporate more vegetables into diet.  Suspect pt's poor appetite at home related to feeling full quickly and reliance on convenience foods.   Average Meal Completion: 12/3-12/4: 90% average intake x 5 recorded meals  Medications: Colace  Ensure BID Tamiflu  Protonix  Prednisone    Labs reviewed  NUTRITION - FOCUSED PHYSICAL EXAM:  Flowsheet Row Most Recent Value  Orbital Region No depletion  Upper Arm Region No depletion  Thoracic and Lumbar Region No depletion  Buccal Region No depletion  Temple Region Mild depletion  Clavicle Bone Region No depletion  Clavicle and Acromion Bone Region No depletion  Scapular Bone Region No depletion  Dorsal Hand No depletion  Patellar Region Mild depletion  Anterior Thigh Region Mild depletion  Posterior Calf Region Mild depletion  Edema (RD Assessment) None  Hair Reviewed  Eyes Reviewed  Mouth Reviewed  Skin Reviewed  Nails Reviewed     Diet Order:   Diet Order             Diet regular Room  service appropriate? Yes; Fluid consistency: Thin  Diet effective now                   EDUCATION NEEDS:   Education needs have been addressed  Skin:  Skin Assessment: Reviewed RN Assessment  Last BM:  unknown  Height:   Ht Readings from Last 1 Encounters:  08/03/24 5' 5 (1.651 m)    Weight:   Wt Readings from Last 1 Encounters:  08/03/24 81.6 kg    Ideal Body Weight:  56.8 kg  BMI:  Body  mass index is 29.95 kg/m.  Estimated Nutritional Needs:   Kcal:  1800-2000  Protein:  70-90g  Fluid:  1.8-2L    Josette Glance, MS, RDN, LDN Clinical Dietitian I Please reach out via secure chat

## 2024-08-05 NOTE — TOC CM/SW Note (Signed)
 Transition of Care [facility]) - Inpatient Brief Assessment   Patient Details  Name: [name] MRN: [id number] Date of Birth: 1966/05/28  Transition of Care [facility]) CM/SW Contact:    Tom-Johnson, Harvest Muskrat, RN Phone Number: 08/05/2024, 2:08 PM   Clinical Narrative:  Patient presented to the ED with worsening Shortness Of Breath, Coughing, Fever and Wheezing. CT Chest showed increased Interstitial markings possibly 2/2 Fluid Overload v/s Inflammation or Infection. Found to be positive for Influenza A. Admitted with COPD Exacerbation.  Patient has hx of Lung Cancer s/p Rt Upper Lobe Lobectomy, Radiation Fibrosis, Langerhan's Cell Histiocytosis, Rt Calf Melanoma. Patient is on 2L O2 baseline, receives O2 supplies from [employer], IV abx, Neb tx, Inhalers, oral Prednisone  and Tamiflu .   CM spoke with patient at bedside about needs for post hospital transition. Patient states she lives alone, has one daughter, both parents and a supportive brother. Independent with care, has access to [facility] Not employed, on disability. Does not drive, states she has a friend taht transports her to and from her appointments.  PCP is Yolande Toribio MATSU, MD and uses [facility] on [address].   Patient requested for Financial assistance with her hospital bills. CM informed patient she can call the billings department and make payment arrangements. CM also referred patient to Financial Counseling/[facility] recommended, patient declined.   Patient not Medically ready for discharge.  CM will continue to follow as patient progresses with care towards discharge.          Transition of Care Asessment: Insurance and Status: Insurance coverage has been reviewed Patient has primary care physician: Yes Home environment has been reviewed: Yes Prior level of function:: Independent Prior/Current Home Services: No current home services Social Drivers of Health Review: SDOH reviewed no  interventions necessary Readmission risk has been reviewed: Yes Transition of care needs: transition of care needs identified, TOC will continue to follow

## 2024-08-05 NOTE — Progress Notes (Signed)
 Progress Note   Patient: [name] FMW:[id number] DOB: [birth year] DOA: 08/03/2024     1 DOS: the patient was seen and examined on 08/05/2024   Brief hospital course: [name] is a 58 y.o. 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   She is admitted for COPD exacerbation, found to have flu A positive.   Assessment and Plan: Acute on chronic hypoxic respiratory failure. In the setting of Flu A infection, COPD Exacerbation Possible pneumonia/ known radiation fibrosis. Patient has moderate respiratory distress, has tachypnea, tachycardia. Feels weak. Requiring 3L supplemental O2 to maintain saturation >90% (baseline 1L) Continue IV Maxipime. IV steroids changed to prednisone  40mg . Started Tamiflu  75mg  bid for 5 days. Continue duonebs, increased O2 as needed. Leukocytosis possibly due to steroids. Will consider Pulmonology if she does not respond clinically in 1-2 days.  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 Resume Lasix  from tomorrow. Continue Coreg . Caution with low BP. Will resume ARB, aldactone  if BP better.  GERD - continue PPI.   Lung nodule - repeat CT in 12 months is recommended but given her history of lung cancer I feel this may need closer monitoring.   Debility- In the setting of flu, copd, hypoxia. PT/ OT evaluation for dc needs.      Subjective: Patient is seen and examined today morning. She is in respiratory distress with rest and exertion. Currently on 3L supplemental oxygen. Eating fair. Feels weak. Still tachypneic, tachycardia.  Physical Exam: Vitals:   08/04/24 1945 08/05/24 0534 08/05/24 0808 08/05/24 1701  BP: 110/67 (!) 101/58 99/66 114/79  Pulse: 99 100 (!)  116 (!) 108  Resp: 20 19 19 19   Temp: 98.1 F (36.7 C) 97.6 F (36.4 C) 98.5 F (36.9 C) 98.1 F (36.7 C)  TempSrc: Oral Oral    SpO2: 97% 93% 91% 92%  Weight:      Height:       General - Middle aged over weight Caucasian female, moderate respiratory distress HEENT - PERRLA, EOMI, atraumatic head, non tender sinuses. Lung - Clear, basal rales, diffuse wheezes. Heart - S1, S2 heard, no murmurs, rubs, trace pedal edema. Abdomen - Soft, non tender, bowel sounds good Neuro - Alert, awake and oriented x 3, non focal exam. Skin - Warm and dry. Data Reviewed:     Latest Ref Rng & Units 08/04/2024    2:43 AM 08/03/2024    6:33 PM 06/15/2024   11:21 AM  CBC  WBC 4.0 - 10.5 K/uL 10.7  13.4  6.1   Hemoglobin 12.0 - 15.0 g/dL 88.0  87.5  87.7   Hematocrit 36.0 - 46.0 % 36.0  39.7  36.4   Platelets 150 - 400 K/uL 205  222  240.0        Latest Ref Rng & Units 08/04/2024    2:43 AM 08/03/2024    6:33 PM 01/13/2024    4:11 PM  BMP  Glucose 70 - 99 mg/dL 882  895  89   BUN 6 - 20 mg/dL 7  8  9    Creatinine 0.44 - 1.00 mg/dL 8.98  9.03  9.10   BUN/Creat Ratio 9 - 23   10   Sodium 135 - 145 mmol/L 136  134  142  Potassium 3.5 - 5.1 mmol/L 4.0  4.5  4.1   Chloride 98 - 111 mmol/L 97  100  103   CO2 22 - 32 mmol/L 25  22  17    Calcium  8.9 - 10.3 mg/dL 8.6  8.3  9.5    CT Angio Chest PE W and/or Wo Contrast Result Date: 08/03/2024 EXAM: CTA of the Chest with contrast for PE 08/03/2024 10:59:47 PM TECHNIQUE: CTA of the chest was performed after the administration of intravenous contrast. Multiplanar reformatted images are provided for review. MIP images are provided for review. Automated exposure control, iterative reconstruction, and/or weight based adjustment of the mA/kV was utilized to reduce the radiation dose to as low as reasonably achievable. COMPARISON: 01/02/2023 CLINICAL HISTORY: Pulmonary embolism (PE) suspected, high prob. FINDINGS: PULMONARY ARTERIES: Pulmonary arteries are  adequately opacified for evaluation. The central pulmonary arteries are enlarged in keeping with changes of pulmonary arterial hypertension. No pulmonary embolism. MEDIASTINUM: Left main coronary artery with stenting has been performed. Global cardiac size within normal limits. No pericardial effusion. Moderate atherosclerotic calcification within the thoracic aorta. No aortic aneurysm. LYMPH NODES: No mediastinal, hilar or axillary lymphadenopathy. LUNGS AND PLEURA: Surgical changes of the right upper lobectomy are identified. Right paramediastinal superficial changes are identified in keeping with post-radiation therapy risks. Moderate emphysema. Superimposed diffuse ground-glass pulmonary infiltrate has progressed since the prior examination, edema versus progressive inflammatory infiltrate. Stable 4 mm pulmonary nodule within the left upper lobe (33/6). New 5 mm nodule within the left upper lobe (44/6). No pneumothorax or pleural effusion. UPPER ABDOMEN: Stable mild splenomegaly. No acute abnormality within the visualized upper abdomen. SOFT TISSUES AND BONES: Right fifth rib thoracotomy defect noted. No acute bone or soft tissue abnormality. No lytic or blastic bone lesion. IMPRESSION: 1. No pulmonary embolism. 2. Enlarged central pulmonary arteries consistent with pulmonary arterial hypertension. 3. Progression of superimposed diffuse ground-glass pulmonary infiltrate since the prior examination, possibly representing edema versus progressive chronic inflammatory infiltrate. 4. 5 mm left upper lobe pulmonary nodule. Per Fleischner Society Guidelines a non-contrast Chest CT at 12 months is optional. If performed and the nodule is stable at 12 months, no further follow-up is recommended. Electronically signed by: Dorethia Molt MD 08/03/2024 11:13 PM EST RP Workstation: [id number]     Family Communication: discussed with patient, she understand and agree.  Disposition: Status is: Inpatient Remains  inpatient appropriate because: wean O2, IV abx, steroids, duonebs.  Planned Discharge Destination: Home with Home Health    Time spent: 51 minutes  Author: Concepcion Riser, MD 08/05/2024 6:44 PM  For on call review [email or web address].

## 2024-08-06 DIAGNOSIS — J449 Chronic obstructive pulmonary disease, unspecified: Secondary | ICD-10-CM

## 2024-08-06 DIAGNOSIS — J9621 Acute and chronic respiratory failure with hypoxia: Secondary | ICD-10-CM | POA: Diagnosis not present

## 2024-08-06 DIAGNOSIS — I5042 Chronic combined systolic (congestive) and diastolic (congestive) heart failure: Secondary | ICD-10-CM | POA: Diagnosis not present

## 2024-08-06 DIAGNOSIS — J441 Chronic obstructive pulmonary disease with (acute) exacerbation: Secondary | ICD-10-CM | POA: Diagnosis not present

## 2024-08-06 LAB — GLUCOSE, CAPILLARY: Glucose-Capillary: 100 mg/dL — ABNORMAL HIGH (ref 70–99)

## 2024-08-06 MED ORDER — GUAIFENESIN ER 600 MG PO TB12
1200.0000 mg | ORAL_TABLET | Freq: Two times a day (BID) | ORAL | Status: DC
  Administered 2024-08-06 – 2024-08-08 (×5): 1200 mg via ORAL
  Filled 2024-08-06 (×4): qty 2

## 2024-08-06 NOTE — Progress Notes (Addendum)
 Physical Therapy Treatment Patient Details Name: [name] MRN: [id number] DOB: 1966/01/19 Today's Date: 08/06/2024   History of Present Illness 58 yo female presentes 12/2 with SOB. Workup for COPD exacerbation. CT chest with lung nodule. 12/3 positive Flu A. PMHx: R lobectomy 2/2 Lung CA, radiation fibrosis, langergans cell histiocytoiss and meanoma of R calf    PT Comments  Pt received in supine, c/o increased malaise, RN notified. Continuous pulse ox monitor brought to her room to allow for more close monitoring of pt symptoms as per pt reporting she feels short of breath more this date, and pt noted to have SpO2 below MD goal of >92%, so O2 titrated up to 4L/min, RN notified and flowsheet updated. Discussion on posture for improved pulmonary clearance, benefits of OOB frequently within tolerance, need for assist and DME use for fall risk prevention, and breathing exercises for better pulmonary clearance and endurance. Bed alarm set for pt safety as she was feeling possibly pre-syncopal with exertion in previous PT session in hallway. Pt continues to benefit from PT services to progress toward functional mobility goals, disposition updated below per discussion with supervising PT Kate ORN to HHPT.    If plan is discharge home, recommend the following: Assist for transportation;Help with stairs or ramp for entrance;A little help with walking and/or transfers;Assistance with cooking/housework   Can travel by Pension Scheme Manager (4 wheels)    Recommendations for Other Services       Precautions / Restrictions Precautions Precautions: Fall;Other (comment) Recall of Precautions/Restrictions: Intact Precaution/Restrictions Comments: watch O2 & HR Restrictions Weight Bearing Restrictions Per Provider Order: No     Mobility  Bed Mobility Overal bed mobility: Needs Assistance Bed Mobility: Supine to Sit, Sit to Supine     Supine to sit:  Supervision     General bed mobility comments: to long sitting in bed while repositioning    Transfers Overall transfer level: Needs assistance                 General transfer comment: second session at bed level only    Ambulation/Gait               General Gait Details: pt c/o fatigue, session focus on education   Stairs             Wheelchair Mobility     Tilt Bed    Modified Rankin (Stroke Patients Only)       Balance Overall balance assessment: Needs assistance Sitting-balance support: No upper extremity supported, Feet supported Sitting balance-Leahy Scale: Good Sitting balance - Comments: long sitting in bed, cross-legged                                    Communication Communication Communication: No apparent difficulties  Cognition Arousal: Alert Behavior During Therapy: Anxious   PT - Cognitive impairments: Problem solving, Safety/Judgement, Awareness, Attention, Initiation, Sequencing                       PT - Cognition Comments: Pt c/o brain fog and fever symptoms, PTA provided emotional support and active listening. Following commands: Impaired Following commands impaired: Follows one step commands with increased time, Follows multi-step commands inconsistently    Cueing Cueing Techniques: Visual cues, Verbal cues, Tactile cues, Gestural cues  Exercises Other Exercises Other Exercises: PTA provided education  on pursed-lip breathing, handout brought after session on seated breathing exercises including diaphragmatic breathing and PLB, pt encouraged to perform a few times daily or PRN. Other Exercises: PTA encouraging IS use    General Comments General comments (skin integrity, edema, etc.): HR ~100 bpm, SpO2 89-91% on 3L O2 [place] at rest in bed, per chart review MD wants SpO2 >92%, so wall O2 increased to 4L/min, RN notified, PTA updated flowsheet as well. Pt provided with bedside O2 monitor for continuous  VS monitoring for safety as MD trying to wean her O2 over the weekend per secure chat discussion, RN notified.      Pertinent Vitals/Pain Pain Assessment Pain Assessment: PAINAD Breathing: occasional labored breathing, short period of hyperventilation Negative Vocalization: occasional moan/groan, low speech, negative/disapproving quality Facial Expression: sad, frightened, frown Body Language: tense, distressed pacing, fidgeting Consolability: distracted or reassured by voice/touch PAINAD Score: 5 Pain Intervention(s): Limited activity within patient's tolerance, Monitored during session, Repositioned    Home Living                          Prior Function            PT Goals (current goals can now be found in the care plan section) Acute Rehab PT Goals Patient Stated Goal: to feel better and go home PT Goal Formulation: With patient Time For Goal Achievement: 08/18/24 Progress towards PT goals: Progressing toward goals    Frequency    Min 1X/week      PT Plan      Co-evaluation              AM-PAC PT 6 Clicks Mobility   Outcome Measure  Help needed turning from your back to your side while in a flat bed without using bedrails?: None Help needed moving from lying on your back to sitting on the side of a flat bed without using bedrails?: None Help needed moving to and from a bed to a chair (including a wheelchair)?: A Little Help needed standing up from a chair using your arms (e.g., wheelchair or bedside chair)?: A Little Help needed to walk in hospital room?: A Little Help needed climbing 3-5 steps with a railing? : A Lot 6 Click Score: 19    End of Session Equipment Utilized During Treatment: Gait belt;Oxygen Activity Tolerance: Patient limited by fatigue;Other (comment) (DOE, fever type symptoms) Patient left: with bed alarm set;in bed;with call bell/phone within reach;with family/visitor present (Best friend present in room) Nurse  Communication: Mobility status;Other (comment) (pt place on continuous pulse ox) PT Visit Diagnosis: Other abnormalities of gait and mobility (R26.89);Unsteadiness on feet (R26.81)     Time: 8374-8366 PT Time Calculation (min) (ACUTE ONLY): 8 min  Charges:    $Gait Training: 8-22 mins $Therapeutic Activity: 8-22 mins PT General Charges $$ ACUTE PT VISIT: 1 Visit                     Sheva Mcdougle P., PTA Acute Rehabilitation Services Secure Chat Preferred 9a-5:30pm Office: [phone]    [facility] 08/06/2024, 5:28 PM

## 2024-08-06 NOTE — Progress Notes (Signed)
 Progress Note   Patient: [name] FMW:[id number] DOB: 1966/07/15 DOA: 08/03/2024     2 DOS: the patient was seen and examined on 08/06/2024   Brief hospital course: [name] is a 58 y.o. 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   She is admitted for COPD exacerbation, found to have flu A positive.   Assessment and Plan: Acute on chronic hypoxic respiratory failure. In the setting of Flu A infection, COPD Exacerbation Possible pneumonia/ known radiation fibrosis. Patient has moderate respiratory distress, has tachypnea, tachycardia. Feels weak. Requiring 3L supplemental O2 to maintain saturation >90% (baseline 1L) Continue IV Maxipime . Continue prednisone  40mg . Continue Tamiflu  75mg  bid for 5 days. Continue duonebs, increased O2 as needed. Added mucinex . Aggressive pulmonary toilet. Will consider Pulmonology if she does not respond clinically in 1-2 days.  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 Resume Lasix  from tomorrow. Continue Coreg .  Hold ARB, aldactone  as BP lower side.  GERD - continue PPI.   Lung nodule - repeat CT in 12 months is recommended but given her history of lung cancer I feel this may need closer monitoring.   Debility- In the setting of flu, copd, hypoxia. PT/ OT advised outpatient PT.      Subjective: Patient is seen and examined today morning. She is in respiratory distress more with exertion today. Has productive cough. Currently on 2L supplemental oxygen. Eating fair. Feels better than yesterday.   Physical Exam: Vitals:   08/05/24 1942 08/05/24 2048 08/06/24 0354 08/06/24 0738  BP: (!) 110/96  112/76 105/72  Pulse: (!) 102  96 (!) 105  Resp: 19  19 16    Temp: 98.1 F (36.7 C)  98.3 F (36.8 C) 97.9 F (36.6 C)  TempSrc:      SpO2: 93% 94% 95% 90%  Weight:      Height:       General - Middle aged over weight Caucasian female, moderate respiratory distress HEENT - PERRLA, EOMI, atraumatic head, non tender sinuses. Lung - Clear, basal rales, diffuse wheezes. Heart - S1, S2 heard, no murmurs, rubs, trace pedal edema. Abdomen - Soft, non tender, bowel sounds good Neuro - Alert, awake and oriented x 3, non focal exam. Skin - Warm and dry. Data Reviewed:     Latest Ref Rng & Units 08/04/2024    2:43 AM 08/03/2024    6:33 PM 06/15/2024   11:21 AM  CBC  WBC 4.0 - 10.5 K/uL 10.7  13.4  6.1   Hemoglobin 12.0 - 15.0 g/dL 88.0  87.5  87.7   Hematocrit 36.0 - 46.0 % 36.0  39.7  36.4   Platelets 150 - 400 K/uL 205  222  240.0        Latest Ref Rng & Units 08/04/2024    2:43 AM 08/03/2024    6:33 PM 01/13/2024    4:11 PM  BMP  Glucose 70 - 99 mg/dL 882  895  89   BUN 6 - 20 mg/dL 7  8  9    Creatinine 0.44 - 1.00 mg/dL 8.98  9.03  9.10   BUN/Creat Ratio 9 - 23   10   Sodium 135 - 145 mmol/L 136  134  142   Potassium 3.5 - 5.1 mmol/L 4.0  4.5  4.1   Chloride 98 - 111 mmol/L 97  100  103   CO2 22 - 32 mmol/L 25  22  17    Calcium  8.9 - 10.3 mg/dL 8.6  8.3  9.5    No results found.    Family Communication: discussed with patient, she understand and agree.  Disposition: Status is: Inpatient Remains inpatient appropriate because: wean O2, IV abx, steroids, duonebs.  Planned Discharge Destination: Home with Home Health    Time spent: 43 minutes  Author: Concepcion Riser, MD 08/06/2024 3:42 PM  For on call review [email or web address].

## 2024-08-06 NOTE — TOC Progression Note (Signed)
 Transition of Care [facility]) - Progression Note    Patient Details  Name: [name] MRN: [id number] Date of Birth: [birth year]  Transition of [facility]) CM/SW Contact  Lendia Dais, [place] Phone Number: 08/06/2024, 3:14 PM  Clinical Narrative:  OT informed CSW via secure chat that the pt had no shoes d.t losing them in the ED. CSW inquired with the pt at bedside and stated that her mother brought her a pair.  TOC will continue to monitor for DC.                      Expected Discharge Plan and Services                                               Social Drivers of Health (SDOH) Interventions SDOH Screenings   Food Insecurity: No Food Insecurity (08/04/2024)  Housing: Low Risk  (08/04/2024)  Transportation Needs: No Transportation Needs (08/04/2024)  Utilities: Not At Risk (08/04/2024)  Tobacco Use: Medium Risk (08/04/2024)    Readmission Risk Interventions    08/05/2024    2:08 PM  Readmission Risk Prevention Plan  Transportation Screening Complete  PCP or Specialist Appt within 5-7 Days Complete  Home Care Screening Complete  Medication Review (RN CM) Referral to Pharmacy

## 2024-08-06 NOTE — Plan of Care (Signed)

## 2024-08-06 NOTE — Progress Notes (Addendum)
 Physical Therapy Treatment Patient Details Name: [name] MRN: [id number] DOB: September 11, 1965 Today's Date: 08/06/2024   History of Present Illness 58 yo female presentes 12/2 with SOB. Workup for COPD exacerbation. CT chest with lung nodule. 12/3 positive Flu A. PMHx: R lobectomy 2/2 Lung CA, radiation fibrosis, langergans cell histiocytoiss and meanoma of R calf    PT Comments  Pt received in supine, agreeable to work with SPTA. Pt CGA for bed mobility requesting HHA to sit up. Pt supervision to CGA (with fatigue for safety) with transfers with cues for hand placement prior to standing. Pt CGA to minA with gait requiring cues for rollator safety and episodes of unsteadiness and took seated break when feeling episode of wooziness. Pt BP taken sitting and standing after episode, both stable. Pt SpO2 between 88-92% on 3L with ambulation, improving with rest. Pt c/o hot flashes and clamminess intermittently during session and feeling feverish. Pt continues to benefit from PT services to progress toward functional mobility goals.   If plan is discharge home, recommend the following: Assist for transportation;Help with stairs or ramp for entrance;A little help with walking and/or transfers;Assistance with cooking/housework   Can travel by Pension Scheme Manager (4 wheels)    Recommendations for Other Services       Precautions / Restrictions Precautions Precautions: Fall;Other (comment) Recall of Precautions/Restrictions: Intact Precaution/Restrictions Comments: watch O2 & HR Restrictions Weight Bearing Restrictions Per Provider Order: No     Mobility  Bed Mobility Overal bed mobility: Needs Assistance Bed Mobility: Supine to Sit     Supine to sit: Contact guard     General bed mobility comments: Pt used HHA to sit up in bed but stated shes been getting up on her own to go to bathroom.    Transfers Overall transfer level: Needs  assistance Equipment used: Rollator (4 wheels) Transfers: Sit to/from Stand Sit to Stand: Supervision           General transfer comment: Cues for hand placement on rollator and use of brakes    Ambulation/Gait Ambulation/Gait assistance: +2 safety/equipment, Contact guard assist, Min assist Gait Distance (Feet): 50 Feet Assistive device: Rollator (4 wheels) Gait Pattern/deviations: Step-through pattern, Decreased stride length Gait velocity: decr     General Gait Details: Pt CGA with rollator with cues for safety with rollator and pt abruptly sat down saying she felt she was going to pass out. Sitting BP taken and WNL and pt was coughing up phlegm during gait.   Stairs             Wheelchair Mobility     Tilt Bed    Modified Rankin (Stroke Patients Only)       Balance Overall balance assessment: Needs assistance Sitting-balance support: No upper extremity supported, Feet supported Sitting balance-Leahy Scale: Good Sitting balance - Comments: EOB   Standing balance support: No upper extremity supported Standing balance-Leahy Scale: Fair Standing balance comment: Pt static standing without AD for short period of time.                            Communication Communication Communication: No apparent difficulties  Cognition Arousal: Alert Behavior During Therapy: Anxious   PT - Cognitive impairments: Problem solving, Safety/Judgement, Awareness, Attention, Initiation, Sequencing                       PT - Cognition  Comments: Pt anxious about having to go home while she still feels this bad, pt c/o brain fog stating she is normally not this bad. Following commands: Impaired Following commands impaired: Follows one step commands with increased time, Follows multi-step commands inconsistently    Cueing Cueing Techniques: Visual cues, Verbal cues, Tactile cues, Gestural cues  Exercises      General Comments General comments (skin  integrity, edema, etc.): Pt max HR ~115bpm. SpO2 88-92% on 3L with ambulation improving with rest. Pt BP sitting 119/82 standing 116/68. Pt c/o fatigue and abruptly wanting to sit during gait. Pt worried about being d/c today.      Pertinent Vitals/Pain Pain Assessment Pain Assessment: PAINAD Breathing: occasional labored breathing, short period of hyperventilation Negative Vocalization: occasional moan/groan, low speech, negative/disapproving quality Facial Expression: sad, frightened, frown Body Language: tense, distressed pacing, fidgeting Consolability: distracted or reassured by voice/touch PAINAD Score: 5 Pain Intervention(s): Limited activity within patient's tolerance, Monitored during session, Repositioned    Home Living                          Prior Function            PT Goals (current goals can now be found in the care plan section) Acute Rehab PT Goals Patient Stated Goal: to feel better and go home PT Goal Formulation: With patient Time For Goal Achievement: 08/18/24 Progress towards PT goals: Progressing toward goals    Frequency    Min 1X/week      PT Plan      Co-evaluation              AM-PAC PT 6 Clicks Mobility   Outcome Measure  Help needed turning from your back to your side while in a flat bed without using bedrails?: None Help needed moving from lying on your back to sitting on the side of a flat bed without using bedrails?: None Help needed moving to and from a bed to a chair (including a wheelchair)?: A Little Help needed standing up from a chair using your arms (e.g., wheelchair or bedside chair)?: A Little Help needed to walk in hospital room?: A Little Help needed climbing 3-5 steps with a railing? : A Little 6 Click Score: 20    End of Session Equipment Utilized During Treatment: Gait belt;Oxygen Activity Tolerance: Patient limited by fatigue Patient left: with bed alarm set;in bed;with call bell/phone within  reach;with family/visitor present (Best friend present in room) Nurse Communication: Mobility status;Other (comment) (Pt may need to be put on continuous pulse ox monitoring for O2 weaning.) PT Visit Diagnosis: Other abnormalities of gait and mobility (R26.89);Unsteadiness on feet (R26.81)     Time: 8464-8385 PT Time Calculation (min) (ACUTE ONLY): 39 min  Charges:    $Gait Training: 8-22 mins $Therapeutic Activity: 8-22 mins PT General Charges $$ ACUTE PT VISIT: 1 Visit                    Johnnie Gaynelle JACQUE Johnnie Nylen Creque 08/06/2024, 5:15 PM

## 2024-08-07 ENCOUNTER — Inpatient Hospital Stay (HOSPITAL_COMMUNITY)

## 2024-08-07 DIAGNOSIS — I5042 Chronic combined systolic (congestive) and diastolic (congestive) heart failure: Secondary | ICD-10-CM | POA: Diagnosis not present

## 2024-08-07 DIAGNOSIS — J441 Chronic obstructive pulmonary disease with (acute) exacerbation: Secondary | ICD-10-CM | POA: Diagnosis not present

## 2024-08-07 DIAGNOSIS — J9621 Acute and chronic respiratory failure with hypoxia: Secondary | ICD-10-CM | POA: Diagnosis not present

## 2024-08-07 DIAGNOSIS — J449 Chronic obstructive pulmonary disease, unspecified: Secondary | ICD-10-CM | POA: Diagnosis not present

## 2024-08-07 MED ORDER — FUROSEMIDE 40 MG PO TABS
40.0000 mg | ORAL_TABLET | Freq: Two times a day (BID) | ORAL | Status: DC
  Administered 2024-08-07 – 2024-08-08 (×2): 40 mg via ORAL
  Filled 2024-08-07 (×2): qty 1

## 2024-08-07 NOTE — Plan of Care (Signed)

## 2024-08-07 NOTE — Progress Notes (Signed)
 Progress Note   Patient: [name] FMW:[id number] DOB: [birth year] DOA: 08/03/2024     3 DOS: the patient was seen and examined on 08/07/2024   Brief hospital course: [name] is a 58 y.o. 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   She is admitted for COPD exacerbation, found to have flu A positive.   Assessment and Plan: Acute on chronic hypoxic respiratory failure. In the setting of Flu A infection, COPD Exacerbation Possible pneumonia/ known radiation fibrosis. Patient has moderate respiratory distress, has tachypnea, tachycardia. Feels weak. Requiring 3L supplemental O2 to maintain saturation >90% (baseline 1L) Continue IV Maxipime . Continue prednisone  40mg . Continue Tamiflu  75mg  bid for total 5 days. Continue duonebs, increased O2 as needed, mucinex . Aggressive pulmonary toilet.  Repeat chest xray ordered. Increased Lasix  dose to 40mg  bid. Pulmonology eval if she does not respond clinically in 1-2 days.  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 Increased Lasix  to 40mg  bid. Continue Coreg .  Hold ARB, aldactone  as BP lower side.  GERD - continue PPI.   Lung nodule - repeat CT in 12 months is recommended but given her history of lung cancer I feel this may need closer monitoring.   Debility- In the setting of flu, copd, hypoxia. PT/ OT advised outpatient PT.      Subjective: Patient is seen and examined today morning. She is in respiratory distress more congested, has productive cough. Currently on 3-4L supplemental oxygen. Eating fair.   Physical Exam: Vitals:   08/07/24 0433 08/07/24 0817 08/07/24 0910 08/07/24 0915  BP: 109/62 109/69    Pulse: 88 90    Resp: 19 19  (!) 22   Temp: 97.8 F (36.6 C) 98.3 F (36.8 C)    TempSrc:      SpO2: 99% 96% 98% 96%  Weight:      Height:       General - Middle aged over weight Caucasian female, moderate respiratory distress HEENT - PERRLA, EOMI, atraumatic head, non tender sinuses. Lung - Clear, basal rales, diffuse wheezes. Heart - S1, S2 heard, no murmurs, rubs, trace pedal edema. Abdomen - Soft, non tender, bowel sounds good Neuro - Alert, awake and oriented x 3, non focal exam. Skin - Warm and dry. Data Reviewed:     Latest Ref Rng & Units 08/04/2024    2:43 AM 08/03/2024    6:33 PM 06/15/2024   11:21 AM  CBC  WBC 4.0 - 10.5 K/uL 10.7  13.4  6.1   Hemoglobin 12.0 - 15.0 g/dL 88.0  87.5  87.7   Hematocrit 36.0 - 46.0 % 36.0  39.7  36.4   Platelets 150 - 400 K/uL 205  222  240.0        Latest Ref Rng & Units 08/04/2024    2:43 AM 08/03/2024    6:33 PM 01/13/2024    4:11 PM  BMP  Glucose 70 - 99 mg/dL 882  895  89   BUN 6 - 20 mg/dL 7  8  9    Creatinine 0.44 - 1.00 mg/dL 8.98  9.03  9.10   BUN/Creat Ratio 9 - 23   10   Sodium 135 - 145 mmol/L 136  134  142   Potassium 3.5 - 5.1 mmol/L 4.0  4.5  4.1   Chloride 98 - 111 mmol/L 97  100  103   CO2 22 - 32 mmol/L 25  22  17    Calcium  8.9 - 10.3 mg/dL 8.6  8.3  9.5    No results found.    Family Communication: discussed with patient, she understand and agree.  Disposition: Status is: Inpatient Remains inpatient appropriate because: wean O2, IV abx, steroids, duonebs.  Planned Discharge Destination: Home with Home Health    Time spent: 43 minutes  Author: Concepcion Riser, MD 08/07/2024 2:30 PM  For on call review [email or web address].

## 2024-08-07 NOTE — Progress Notes (Signed)
 patient titrated down to 1 LPM O2, O2 sat 92%. MD notified

## 2024-08-08 ENCOUNTER — Other Ambulatory Visit: Payer: Self-pay | Admitting: Internal Medicine

## 2024-08-08 ENCOUNTER — Other Ambulatory Visit (HOSPITAL_COMMUNITY): Payer: Self-pay

## 2024-08-08 DIAGNOSIS — J9621 Acute and chronic respiratory failure with hypoxia: Secondary | ICD-10-CM | POA: Diagnosis not present

## 2024-08-08 DIAGNOSIS — J101 Influenza due to other identified influenza virus with other respiratory manifestations: Secondary | ICD-10-CM | POA: Diagnosis not present

## 2024-08-08 DIAGNOSIS — I5042 Chronic combined systolic (congestive) and diastolic (congestive) heart failure: Secondary | ICD-10-CM | POA: Diagnosis not present

## 2024-08-08 DIAGNOSIS — J441 Chronic obstructive pulmonary disease with (acute) exacerbation: Secondary | ICD-10-CM | POA: Diagnosis not present

## 2024-08-08 LAB — BASIC METABOLIC PANEL WITH GFR
Anion gap: 6 (ref 5–15)
BUN: 16 mg/dL (ref 6–20)
CO2: 30 mmol/L (ref 22–32)
Calcium: 8.5 mg/dL — ABNORMAL LOW (ref 8.9–10.3)
Chloride: 102 mmol/L (ref 98–111)
Creatinine, Ser: 0.82 mg/dL (ref 0.44–1.00)
GFR, Estimated: >60 mL/min (ref >60)
Glucose, Bld: 81 mg/dL (ref 70–99)
Potassium: 3.5 mmol/L (ref 3.5–5.1)
Sodium: 138 mmol/L (ref 135–145)

## 2024-08-08 LAB — CBC
HCT: 31.7 % — ABNORMAL LOW (ref 36.0–46.0)
Hemoglobin: 10.6 g/dL — ABNORMAL LOW (ref 12.0–15.0)
MCH: 27 pg (ref 26.0–34.0)
MCHC: 33.4 g/dL (ref 30.0–36.0)
MCV: 80.7 fL (ref 80.0–100.0)
Platelets: 240 K/uL (ref 150–400)
RBC: 3.93 MIL/uL (ref 3.87–5.11)
RDW: 14.6 % (ref 11.5–15.5)
WBC: 11.6 K/uL — ABNORMAL HIGH (ref 4.0–10.5)
nRBC: 0 % (ref 0.0–0.2)

## 2024-08-08 LAB — CULTURE, BLOOD (ROUTINE X 2): Culture: NO GROWTH

## 2024-08-08 MED ORDER — GUAIFENESIN ER 600 MG PO TB12: 1200.0000 mg | ORAL_TABLET | Freq: Two times a day (BID) | ORAL | 0 refills | Status: DC

## 2024-08-08 MED ORDER — AMOXICILLIN-POT CLAVULANATE 875-125 MG PO TABS: 1.0000 | ORAL_TABLET | Freq: Two times a day (BID) | ORAL | 0 refills | Status: DC

## 2024-08-08 MED ORDER — PREDNISONE 10 MG PO TABS: ORAL_TABLET | ORAL | 0 refills | Status: DC

## 2024-08-08 MED ORDER — PREDNISONE 10 MG PO TABS
ORAL_TABLET | ORAL | 0 refills | Status: AC
  Filled 2024-08-08: qty 20, 8d supply, fill #0

## 2024-08-08 MED ORDER — AMOXICILLIN-POT CLAVULANATE 875-125 MG PO TABS
1.0000 | ORAL_TABLET | Freq: Two times a day (BID) | ORAL | 0 refills | Status: DC
  Filled 2024-08-08: qty 6, 3d supply, fill #0

## 2024-08-08 MED ORDER — GUAIFENESIN ER 600 MG PO TB12
1200.0000 mg | ORAL_TABLET | Freq: Two times a day (BID) | ORAL | 0 refills | Status: AC
  Filled 2024-08-08: qty 40, 10d supply, fill #0

## 2024-08-08 NOTE — Discharge Summary (Signed)
 Physician Discharge Summary   Patient: [name] MRN: [id number] DOB: 11-05-1965  Admit date:     08/03/2024  Discharge date: 08/08/2024  Discharge Physician: Concepcion Riser   PCP: Yolande Toribio MATSU, MD   Recommendations at discharge:    PCP follow up in 1 week.  Discharge Diagnoses: Principal Problem:   COPD with acute exacerbation (HCC) Active Problems:   Non-small cell cancer of right lung (HCC)   Langerhans cell histiocytosis of lung (HCC)   Malignant melanoma of skin of right lower extremity (HCC)   Constipation   Influenza A   CHF (congestive heart failure) (HCC)   Acute on chronic hypoxic respiratory failure (HCC)   COPD mixed type (HCC)  Resolved Problems:   * No resolved hospital problems. *  Hospital Course: [name] is a 58 y.o. 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   She is admitted for COPD exacerbation, found to have flu A positive.    Assessment and Plan: Acute on chronic hypoxic respiratory failure. In the setting of Flu A infection, COPD Exacerbation Possible pneumonia/ known radiation fibrosis. Patient has moderate respiratory distress, has tachypnea, tachycardia. Feels weak. Requiring 3L supplemental O2 to maintain saturation >90% (baseline 1L) IV Maxipime  changed to Augmentin . Taper prednisone  in 1 week. Finished Tamiflu  75mg  bid for total 5 days. Her symptoms much improved, able to ambulate, back to baseline 1L o2. Advised  to continue duonebs, mucinex . Aggressive pulmonary toilet.  Increased Lasix  dose to 40mg  bid in the hospital stay. Advised to go back to home dose. Advised PCP follow up in 1 week, Pulmonology follow up.   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 Increased Lasix  to 40mg  bid last 2 days. Continue Coreg .  Resumed ARB, aldactone .   GERD - continue PPI.   Lung nodule - repeat CT in 12 months is recommended.   Debility- In the setting of flu, copd, hypoxia. PT/ OT advised outpatient PT.      Consultants: none Procedures performed: none  Disposition: Home health Diet recommendation:  Discharge Diet Orders (From admission, onward)     Start     Ordered   08/08/24 0000  Diet - low sodium heart healthy        08/08/24 1023           Cardiac diet DISCHARGE MEDICATION: Allergies as of 08/08/2024   No Known Allergies      Medication List     TAKE these medications    acetaminophen  500 MG tablet Commonly known as: TYLENOL  Take 500 mg by mouth every 8 (eight) hours as needed for moderate pain.   albuterol  108 (90 Base) MCG/ACT inhaler Commonly known as: VENTOLIN  HFA INHALE 2 PUFFS INTO THE LUNGS EVERY 4 TO 6 HOURS AS NEEDED FOR WHEEZING OR SHORTNESS OF BREATH   ALPRAZolam  0.5 MG tablet Commonly known as: Xanax  Take 1 tablet (0.5 mg total) by mouth 2 (two) times daily as needed for up to 1 dose for anxiety.   amoxicillin -clavulanate 875-125 MG tablet Commonly known as: AUGMENTIN  Take 1 tablet by mouth 2 (two) times daily for 3 days.   atorvastatin  40 MG tablet Commonly known as: LIPITOR Take 1 tablet (40 mg total) by mouth daily.   carvedilol  6.25 MG tablet Commonly  known as: COREG  Take 1 tablet (6.25 mg total) by mouth 2 (two) times daily with a meal.   clopidogrel  75 MG tablet Commonly known as: PLAVIX  Take 1 tablet (75 mg total) by mouth daily.   ezetimibe  10 MG tablet Commonly known as: ZETIA  Take 1 tablet (10 mg total) by mouth daily.   furosemide  20 MG tablet Commonly known as: LASIX  Take 1 tablet (20 mg total) by mouth daily.   guaiFENesin  600 MG 12 hr tablet Commonly known as: MUCINEX  Take 2 tablets (1,200 mg total) by mouth 2 (two) times daily for 10 days.    ipratropium-albuterol  0.5-2.5 (3) MG/3ML Soln Commonly known as: DUONEB USE 1 VIAL VIA NEBULIZER EVERY 6 HOURS AS NEEDED   losartan  25 MG tablet Commonly known as: COZAAR  Take 1 tablet (25 mg total) by mouth daily.   nitroGLYCERIN  0.4 MG SL tablet Commonly known as: NITROSTAT  PLACE 1 TABLET(0.4 MG) UNDER THE TONGUE EVERY 5 MINUTES AS NEEDED FOR CHEST PAIN   OXYGEN Inhale 1 L/min into the lungs as needed.   pantoprazole  40 MG tablet Commonly known as: PROTONIX  Take 40 mg by mouth daily.   potassium chloride  10 MEQ tablet Commonly known as: KLOR-CON  Take 1 tablet (10 mEq total) by mouth daily.   predniSONE  10 MG tablet Commonly known as: DELTASONE  Take 4 tablets (40 mg total) by mouth daily with breakfast for 2 days, THEN 3 tablets (30 mg total) daily with breakfast for 2 days, THEN 2 tablets (20 mg total) daily with breakfast for 2 days, THEN 1 tablet (10 mg total) daily with breakfast for 2 days. Start taking on: August 08, 2024   spironolactone  25 MG tablet Commonly known as: ALDACTONE  Take 1 tablet (25 mg total) by mouth daily.   Trelegy Ellipta  100-62.5-25 MCG/ACT Aepb Generic drug: Fluticasone -Umeclidin-Vilant Inhale 1 puff into the lungs daily.   triamcinolone cream 0.1 % Commonly known as: KENALOG Apply topically 2 (two) times daily.   zolpidem  5 MG tablet Commonly known as: AMBIEN  TAKE 1 TABLET BY MOUTH AT BEDTIME AS NEEDED FOR SLEEP        Discharge Exam: Filed Weights   08/03/24 1719  Weight: 81.6 kg      08/08/2024    7:51 AM 08/08/2024    5:12 AM 08/07/2024    7:05 PM  Vitals with BMI  Systolic 105 118 896  Diastolic 72 81 65  Pulse 104 94 100    General - Middle aged over weight Caucasian female, no respiratory distress HEENT - PERRLA, EOMI, atraumatic head, non tender sinuses. Lung - Clear, basal rales, diffuse wheezes. Heart - S1, S2 heard, no murmurs, rubs, trace pedal edema. Abdomen - Soft, non tender, bowel sounds good Neuro -  Alert, awake and oriented x 3, non focal exam. Skin - Warm and dry.  Condition at discharge: stable  The results of significant diagnostics from this hospitalization (including imaging, microbiology, ancillary and laboratory) are listed below for reference.   Imaging Studies: DG Chest 2 View Result Date: 08/07/2024 EXAM: 2 VIEW(S) XRAY OF THE CHEST 08/07/2024 03:22:37 PM COMPARISON: 08/03/2024. CLINICAL HISTORY: Hypoxia. FINDINGS: LINES, TUBES AND DEVICES: Surgical clips are present on the right, associated with right hilar and suprahilar involvement. LUNGS AND PLEURA: Postoperative changes on the right with right hilar and suprahilar involvement are stable and likely reflect post-treatment changes. No acute confluent airspace opacities. No pleural effusion. No pneumothorax. HEART AND MEDIASTINUM: Right paramediastinal soft tissue findings are stable since the pericardial and likely reflect post-treatment  changes. Aortic atherosclerosis. BONES AND SOFT TISSUES: No acute osseous abnormality. IMPRESSION: 1. No acute cardiopulmonary process. 2. Stable right-sided postoperative/post-treatment changes. Electronically signed by: Franky Crease MD 08/07/2024 07:12 PM EST RP Workstation: [id number]   CT Angio Chest PE W and/or Wo Contrast Result Date: 08/03/2024 EXAM: CTA of the Chest with contrast for PE 08/03/2024 10:59:47 PM TECHNIQUE: CTA of the chest was performed after the administration of intravenous contrast. Multiplanar reformatted images are provided for review. MIP images are provided for review. Automated exposure control, iterative reconstruction, and/or weight based adjustment of the mA/kV was utilized to reduce the radiation dose to as low as reasonably achievable. COMPARISON: 01/02/2023 CLINICAL HISTORY: Pulmonary embolism (PE) suspected, high prob. FINDINGS: PULMONARY ARTERIES: Pulmonary arteries are adequately opacified for evaluation. The central pulmonary arteries are enlarged in keeping with  changes of pulmonary arterial hypertension. No pulmonary embolism. MEDIASTINUM: Left main coronary artery with stenting has been performed. Global cardiac size within normal limits. No pericardial effusion. Moderate atherosclerotic calcification within the thoracic aorta. No aortic aneurysm. LYMPH NODES: No mediastinal, hilar or axillary lymphadenopathy. LUNGS AND PLEURA: Surgical changes of the right upper lobectomy are identified. Right paramediastinal superficial changes are identified in keeping with post-radiation therapy risks. Moderate emphysema. Superimposed diffuse ground-glass pulmonary infiltrate has progressed since the prior examination, edema versus progressive inflammatory infiltrate. Stable 4 mm pulmonary nodule within the left upper lobe (33/6). New 5 mm nodule within the left upper lobe (44/6). No pneumothorax or pleural effusion. UPPER ABDOMEN: Stable mild splenomegaly. No acute abnormality within the visualized upper abdomen. SOFT TISSUES AND BONES: Right fifth rib thoracotomy defect noted. No acute bone or soft tissue abnormality. No lytic or blastic bone lesion. IMPRESSION: 1. No pulmonary embolism. 2. Enlarged central pulmonary arteries consistent with pulmonary arterial hypertension. 3. Progression of superimposed diffuse ground-glass pulmonary infiltrate since the prior examination, possibly representing edema versus progressive chronic inflammatory infiltrate. 4. 5 mm left upper lobe pulmonary nodule. Per Fleischner Society Guidelines a non-contrast Chest CT at 12 months is optional. If performed and the nodule is stable at 12 months, no further follow-up is recommended. Electronically signed by: Dorethia Molt MD 08/03/2024 11:13 PM EST RP Workstation: [id number]   DG Chest 2 View Result Date: 08/03/2024 CLINICAL DATA:  Questionable sepsis - evaluate for abnormality EXAM: CHEST - 2 VIEW COMPARISON:  06/15/2024, 01/02/2023 FINDINGS: Right-sided volume loss and apical pleural thickening  with similar paramediastinal consolidation in the right upper lobe. Right suprahilar surgical clips. No pleural effusion or pneumothorax. Rightward shift of the trachea and mediastinal structures, unchanged. No cardiomegaly. No acute fracture or destructive lesions. Multilevel thoracic osteophytosis. IMPRESSION: No acute cardiopulmonary abnormality. Electronically Signed   By: Rogelia Myers M.D.   On: 08/03/2024 18:55    Microbiology: Results for orders placed or performed during the hospital encounter of 08/03/24  Blood Culture (routine x 2)     Status: None   Collection Time: 08/03/24  6:35 PM   Specimen: BLOOD LEFT HAND  Result Value Ref Range Status   Specimen Description BLOOD LEFT HAND  Final   Special Requests   Final    BOTTLES DRAWN AEROBIC ONLY Blood Culture results may not be optimal due to an inadequate volume of blood received in culture bottles   Culture   Final    NO GROWTH 5 DAYS Performed at [facility], [address] [place], [place] [address]    Report Status 08/08/2024 FINAL  Final  Resp panel by RT-PCR (RSV, Flu A&B, Covid)  Anterior Nasal Swab     Status: Abnormal   Collection Time: 08/04/24  2:56 AM   Specimen: Anterior Nasal Swab  Result Value Ref Range Status   SARS Coronavirus 2 by RT PCR NEGATIVE NEGATIVE Final   Influenza A by PCR POSITIVE (A) NEGATIVE Final   Influenza B by PCR NEGATIVE NEGATIVE Final    Comment: (NOTE) The Xpert Xpress SARS-CoV-2/FLU/RSV plus assay is intended as an aid in the diagnosis of influenza from Nasopharyngeal swab specimens and should not be used as a sole basis for treatment. Nasal washings and aspirates are unacceptable for Xpert Xpress SARS-CoV-2/FLU/RSV testing.  Fact Sheet for Patients: bloggercourse.com  Fact Sheet for Healthcare Providers: seriousbroker.it  This test is not yet approved or cleared by the [place]  FDA and has been authorized for  detection and/or diagnosis of SARS-CoV-2 by FDA under an Emergency Use Authorization (EUA). This EUA will remain in effect (meaning this test can be used) for the duration of the COVID-19 declaration under Section 564(b)(1) of the Act, 21 U.S.C. section 360bbb-3(b)(1), unless the authorization is terminated or revoked.     Resp Syncytial Virus by PCR NEGATIVE NEGATIVE Final    Comment: (NOTE) Fact Sheet for Patients: bloggercourse.com  Fact Sheet for Healthcare Providers: seriousbroker.it  This test is not yet approved or cleared by the [place]  FDA and has been authorized for detection and/or diagnosis of SARS-CoV-2 by FDA under an Emergency Use Authorization (EUA). This EUA will remain in effect (meaning this test can be used) for the duration of the COVID-19 declaration under Section 564(b)(1) of the Act, 21 U.S.C. section 360bbb-3(b)(1), unless the authorization is terminated or revoked.  Performed at [facility], [address], [place], [place] [address]     Labs: CBC: Recent Labs  Lab 08/03/24 1833 08/04/24 0243 08/08/24 0732  WBC 13.4* 10.7* 11.6*  NEUTROABS 11.3*  --   --   HGB 12.4 11.9* 10.6*  HCT 39.7 36.0 31.7*  MCV 83.9 82.4 80.7  PLT 222 205 240   Basic Metabolic Panel: Recent Labs  Lab 08/03/24 1833 08/04/24 0243 08/08/24 0732  NA 134* 136 138  K 4.5 4.0 3.5  CL 100 97* 102  CO2 22 25 30   GLUCOSE 104* 117* 81  BUN 8 7 16   CREATININE 0.96 1.01* 0.82  CALCIUM  8.3* 8.6* 8.5*  MG  --  2.0  --    Liver Function Tests: Recent Labs  Lab 08/03/24 1833 08/04/24 0243  AST 23 18  ALT 14 11  ALKPHOS 78 73  BILITOT 1.4* 0.6  PROT 7.1 6.5  ALBUMIN 3.4* 3.1*   CBG: Recent Labs  Lab 08/06/24 0734  GLUCAP 100*    Discharge time spent: 35 minutes.  Signed: Concepcion Riser, MD [facility] 08/08/2024

## 2024-08-08 NOTE — Plan of Care (Signed)
  Problem: Activity: Goal: Ability to tolerate increased activity will improve Outcome: Completed/Met Goal: Will verbalize the importance of balancing activity with adequate rest periods Outcome: Completed/Met   Problem: Respiratory: Goal: Ability to maintain a clear airway will improve Outcome: Completed/Met Goal: Levels of oxygenation will improve Outcome: Completed/Met Goal: Ability to maintain adequate ventilation will improve Outcome: Completed/Met   Problem: Education: Goal: Knowledge of General Education information will improve Description: Including pain rating scale, medication(s)/side effects and non-pharmacologic comfort measures Outcome: Completed/Met   Problem: Health Behavior/Discharge Planning: Goal: Ability to manage health-related needs will improve Outcome: Completed/Met   Problem: Clinical Measurements: Goal: Ability to maintain clinical measurements within normal limits will improve Outcome: Completed/Met Goal: Will remain free from infection Outcome: Completed/Met Goal: Diagnostic test results will improve Outcome: Completed/Met Goal: Respiratory complications will improve Outcome: Completed/Met Goal: Cardiovascular complication will be avoided Outcome: Completed/Met   Problem: Activity: Goal: Risk for activity intolerance will decrease Outcome: Completed/Met   Problem: Nutrition: Goal: Adequate nutrition will be maintained Outcome: Completed/Met   Problem: Coping: Goal: Level of anxiety will decrease Outcome: Completed/Met   Problem: Elimination: Goal: Will not experience complications related to bowel motility Outcome: Completed/Met Goal: Will not experience complications related to urinary retention Outcome: Completed/Met   Problem: Pain Managment: Goal: General experience of comfort will improve and/or be controlled Outcome: Completed/Met   Problem: Safety: Goal: Ability to remain free from injury will improve Outcome:  Completed/Met   Problem: Skin Integrity: Goal: Risk for impaired skin integrity will decrease Outcome: Completed/Met

## 2024-08-08 NOTE — TOC Transition Note (Signed)
 Transition of Care [facility]) - Discharge Note   Patient Details  Name: [name] MRN: [id number] Date of Birth: 09-02-1966  [facility] CM/SW Contact:  Robynn Eileen Hoose, RN Phone Number: 08/08/2024, 10:55 AM   Clinical Narrative:   Patient is being discharged today. HH orders noted, referral sent to [facility] through EP[facility] portal. Awaiting response.  Rollator recommendations from therapy noted, ordered through Jermaine with [employer].  DME to be delivered to bedside prior to patient discharging home.          Patient Goals and CMS Choice            Discharge Placement                       Discharge Plan and Services Additional resources added to the After Visit Summary for                                       Social Drivers of Health (SDOH) Interventions SDOH Screenings   Food Insecurity: No Food Insecurity (08/04/2024)  Housing: Low Risk  (08/04/2024)  Transportation Needs: No Transportation Needs (08/04/2024)  Utilities: Not At Risk (08/04/2024)  Tobacco Use: Medium Risk (08/04/2024)     Readmission Risk Interventions    08/05/2024    2:08 PM  Readmission Risk Prevention Plan  Transportation Screening Complete  PCP or Specialist Appt within 5-7 Days Complete  Home Care Screening Complete  Medication Review (RN CM) Referral to Pharmacy

## 2024-08-08 NOTE — Plan of Care (Signed)
  Problem: Education: Goal: Knowledge of disease or condition will improve Outcome: Completed/Met Goal: Knowledge of the prescribed therapeutic regimen will improve Outcome: Completed/Met Goal: Individualized Educational Video(s) Outcome: Not Applicable

## 2024-08-10 ENCOUNTER — Ambulatory Visit: Admitting: Internal Medicine

## 2024-08-11 NOTE — Progress Notes (Signed)
 "    Patient ID: [name], female    DOB: July 20, 1966, 58 y.o.   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 Walk test on room air 06/28/2016-95%, 95%, 95%, 93%, peak heart rate 120/minute. No desaturation after 3185 feet. Office Spirometry 06/28/2016-limited validity due to cough. Restriction of exhaled volume. FVC 1.79/50%, FEV1 1.64/57%, ratio 0.80. FENO- 5 Walk Test on room Air- Qualified for home O2 04/10/17 PFT 06/17/17- severe obstruction, mild restriction, diffusion severely reduced. Insignificant response to bronchodilator. FVC 1.73/47%, FEV1 1.10/38%, ratio 0.64, TLC 77%, DLCO 32% CHF- acute hosp 05/2017- CHF, CAD, EF 20-25%. Stent. Added Plavix , spironolactone , Coreg , Cozaar  Walk test 02/10/2019- desat to 88%, on 2L o2 was 94% Echocardiogram 09/16/18- DD, EF 65-70% Walk Test O2 Qualifying 12/09/23- Room Air rest 96%, ambulation dropped to 88% on room air, ambulating 92% on 3L pulse. 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  Productive Cough in mornings. Discussed the use of AI scribe software for clinical note transcription with the patient, who gave verbal consent to proceed.  History of Present Illness   [name] is a 58 year old female with COPD who presents with frequent exacerbations and ineffective response to current treatment.  She experiences frequent exacerbations with increased mucus production and coughing, starting at night and progressing to daytime symptoms. These episodes require medical intervention and do not resolve independently. Her PCP has given multiple rounds of antibiotics and prednisone  this summer. We will check eosinophils, considering Dupixent, and we can consider Ohtuvayre  nebs.  She uses Breztri  for COPD management, but it is no longer effective. During exacerbations, she receives doxycycline , prednisone , and occasionally cough medicine. Prednisone  provides temporary relief for one to two weeks.  She uses oxygen at two liters continuously, including at night. Her breathing is not optimal today. She typically does not use oxygen for short outings but does for longer activities like grocery shopping.     Assessment and Plan:    Chronic obstructive pulmonary disease with frequent exacerbations COPD with frequent exacerbations, Breztri  seems ineffective. Consideration for Trelegy due to inadequate control. Potential for biologics if eosinophil count elevated. - Switch to Trelegy, triple therapy inhaler. - Discussed thrush risk with Trelegy, advised preventive measures. - Evaluate for biologics like Dupixent if eosinophil count elevated. - Consider Ohtuvayre  nebulizer maintenance medication. - Provide Trelegy samples, instruct on use and thrush prevention. - Order chest x-ray. - Order blood count for eosinophil levels. - Evaluate insurance coverage for Trelegy. - Consider Dupixent if  eosinophil count elevated. - Consider Otuvir nebulizer treatment if needed.  Chronic respiratory failure with hypoxia Chronic respiratory failure with hypoxia managed with continuous oxygen therapy. Occasional dyspnea reported. - Continue oxygen therapy at two liters per minute  continuously.      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  History of Present Illness   [name] is a 58 year old female with hx lung cancer, COPD, and chronic hypoxic respiratory failure who presents for routine follow-up after recent hospitalization for COPD exacerbation.  She was recently hospitalized for a COPD exacerbation, during which she had influenza and was treated with Tamiflu , a flu vaccine, and cefdinir . Her usual Trelegy was changed to Breztri  in the hospital, and she resumed Trelegy after discharge.  She has used continuous oxygen at 2 liters since discharge. She takes Mucinex  2 tablets twice daily for congestion and notes some improvement but persistent chest raspiness. She is worried about recurrence of COPD symptoms and the need for ongoing medication.  Her respiratory condition limits daily activities. She also has a sore throat she associates with the recent illness. Still feels wiped out.  Current medications include  Augmentin  and Trelegy. She previously completed courses of Levaquin  and prednisone  before the recent exacerbation. We are going to extend augmentin  since they only gave her enough to get to office.  She reports a unilateral sore throat that is better in the morning and worsens as the day progresses.   CXR 08/07/24 IMPRESSION: 1. No acute cardiopulmonary process. 2. Stable right-sided postoperative/post-treatment changes.  CT chest 08/03/24 MPRESSION: 1. No pulmonary embolism. 2. Enlarged central pulmonary arteries consistent with pulmonary arterial hypertension. 3. Progression of superimposed diffuse ground-glass pulmonary infiltrate since the prior examination, possibly representing edema versus progressive chronic inflammatory infiltrate. 4. 5 mm left upper lobe pulmonary nodule. Per Fleischner Society Guidelines a non-contrast Chest CT at 12 months is optional. If performed and the nodule is stable at 12 months, no further follow-up is recommended.    Assessment and Plan:    COPD with acute exacerbation Recent hospitalization for exacerbation. On Augmentin  for bacterial infection. Persistent respiratory symptoms managed with Mucinex . - Extend Augmentin  for a few more days. - Continue Mucinex  as needed. - Refilled Trelegy for maintenance. - Follow-up with next pulmonologist in a couple of months.  Chronic hypoxic respiratory failure Requires continuous supplemental oxygen for hypoxia management. - Continue supplemental oxygen as directed at 2L.     Abnormal CT Ground glass and nodule -anticipate updating chest CT at f/u   Review of Systems-+ = positive Constitutional:   No-   weight loss, night sweats, fevers, chills, fatigue, lassitude. HEENT:   No-  headaches, difficulty swallowing, tooth/dental problems, sore throat,      mild sneezing,no- itching, ear ache, nasal congestion, post nasal drip,  CV:  No-   chest pain, orthopnea, PND, swelling in lower extremities,  anasarca, dizziness, palpitations Resp: + shortness of breath with exertion or at rest.              +productive cough, + non-productive cough,  No- coughing up of blood.  change in color of mucus. + wheezing.   Skin: No-   rash or lesions. GI:  +HPI GU:   MS:  No-   joint pain or swelling.   Neuro-     nothing unusual Psych:  No- change in mood or affect. No depression or anxiety.  No memory loss.   Objective:   Physical Exam    General- Alert, Oriented, Affect-appropriate, Distress - NAD,  Skin- rash-none, lesions- none, excoriation- none.  Lymphadenopathy- none Head- atraumatic            Eyes- Gross vision intact, PERRLA, conjunctivae clear secretions            Ears- Hearing, canals-normal            Nose- , no-Septal dev, mucus, polyps, erosion, perforation             Throat- Mallampati IV , mucosa clear , drainage- none, tonsils- atrophic,  Neck- flexible , trachea midline, no stridor , thyroid  nl, carotid no bruit Chest - symmetrical excursion , unlabored           Heart/CV- RRR rapid , no murmur , no gallop  , no rub, nl s1 s2                           - JVD none , edema- none, stasis changes- none, varices- none           Lung-   +diminished, +Wheeze-slight, cough+, dullness-none, rub- none. Rales- none           Chest wall- + soft mobile mass L lateral chest wall, c/w lipoma. Abd-  Br/ Gen/ Rectal- Not done, not indicated Extrem- cyanosis- none, clubbing, none, atrophy- none, strength- nl,    Neuro- grossly intact to observation       "

## 2024-08-12 ENCOUNTER — Encounter: Payer: Self-pay | Admitting: Internal Medicine

## 2024-08-12 ENCOUNTER — Ambulatory Visit: Admitting: Internal Medicine

## 2024-08-12 VITALS — BP 122/68 | HR 94 | Temp 97.2°F | Ht 65.0 in | Wt 177.8 lb

## 2024-08-12 DIAGNOSIS — Z87891 Personal history of nicotine dependence: Secondary | ICD-10-CM

## 2024-08-12 DIAGNOSIS — J9611 Chronic respiratory failure with hypoxia: Secondary | ICD-10-CM | POA: Diagnosis not present

## 2024-08-12 DIAGNOSIS — J441 Chronic obstructive pulmonary disease with (acute) exacerbation: Secondary | ICD-10-CM

## 2024-08-12 MED ORDER — AMOXICILLIN-POT CLAVULANATE 875-125 MG PO TABS: 1.0000 | ORAL_TABLET | Freq: Two times a day (BID) | ORAL | 0 refills | Status: AC

## 2024-08-12 MED ORDER — TRELEGY ELLIPTA 100-62.5-25 MCG/ACT IN AEPB: 1.0000 | INHALATION_SPRAY | Freq: Every day | RESPIRATORY_TRACT | 12 refills | Status: AC

## 2024-08-12 NOTE — Patient Instructions (Signed)
 Augmentin  refilled  Trelegy refilled

## 2024-08-27 ENCOUNTER — Encounter: Payer: Self-pay | Admitting: Internal Medicine

## 2024-09-17 ENCOUNTER — Telehealth (HOSPITAL_BASED_OUTPATIENT_CLINIC_OR_DEPARTMENT_OTHER): Payer: Self-pay | Admitting: Cardiovascular Disease

## 2024-09-17 NOTE — Telephone Encounter (Signed)
 Good afternoon ,   This pt wants to know if she can switch from Dr. Raford to Dr. Anner. She expressed that she does not feel like the people care about her over there and she lives [address] so she would like to switch to [employer] [address]. And the reason she said Dr. Anner is because her sister sees him and she spoke highly of him.  If you are both okay with the switch. I will be more than happy to find an open slot for this pt. Thank you .

## 2024-09-19 NOTE — Telephone Encounter (Signed)
 I do not think she is seeing Dr. Raford in quite some time. Hide yes I am fine with it.

## 2025-01-18 ENCOUNTER — Ambulatory Visit: Admitting: Dermatology

## 2025-02-10 ENCOUNTER — Encounter: Admitting: Pulmonary Disease
